# Patient Record
Sex: Female | Born: 1945 | Race: Black or African American | Hispanic: No | Smoking: Former smoker
Health system: Southern US, Community
[De-identification: ages and names within clinical notes are randomized; demographics above are authoritative.]

## PROBLEM LIST (undated history)

## (undated) DIAGNOSIS — Z9221 Personal history of antineoplastic chemotherapy: Secondary | ICD-10-CM

## (undated) DIAGNOSIS — R809 Proteinuria, unspecified: Secondary | ICD-10-CM

## (undated) DIAGNOSIS — D472 Monoclonal gammopathy: Secondary | ICD-10-CM

## (undated) DIAGNOSIS — I10 Essential (primary) hypertension: Secondary | ICD-10-CM

## (undated) DIAGNOSIS — K760 Fatty (change of) liver, not elsewhere classified: Secondary | ICD-10-CM

## (undated) DIAGNOSIS — Z923 Personal history of irradiation: Secondary | ICD-10-CM

## (undated) HISTORY — DX: Personal history of antineoplastic chemotherapy: Z92.21

## (undated) HISTORY — DX: Fatty (change of) liver, not elsewhere classified: K76.0

## (undated) HISTORY — DX: Essential (primary) hypertension: I10

## (undated) HISTORY — DX: Personal history of irradiation: Z92.3

## (undated) HISTORY — PX: ABDOMINAL HYSTERECTOMY: SHX81

## (undated) HISTORY — DX: Monoclonal gammopathy: D47.2

## (undated) HISTORY — DX: Proteinuria, unspecified: R80.9

---

## 2003-08-03 HISTORY — PX: BREAST BIOPSY: SHX20

## 2006-01-19 ENCOUNTER — Ambulatory Visit (HOSPITAL_COMMUNITY): Admission: RE | Admit: 2006-01-19 | Discharge: 2006-01-19 | Payer: Self-pay | Admitting: Family Medicine

## 2008-06-05 ENCOUNTER — Ambulatory Visit (HOSPITAL_COMMUNITY): Admission: RE | Admit: 2008-06-05 | Discharge: 2008-06-05 | Payer: Self-pay | Admitting: Family Medicine

## 2008-07-24 ENCOUNTER — Encounter (HOSPITAL_COMMUNITY): Admission: RE | Admit: 2008-07-24 | Discharge: 2008-08-23 | Payer: Self-pay | Admitting: Oncology

## 2008-07-24 ENCOUNTER — Ambulatory Visit (HOSPITAL_COMMUNITY): Payer: Self-pay | Admitting: Oncology

## 2009-01-13 ENCOUNTER — Ambulatory Visit (HOSPITAL_COMMUNITY): Payer: Self-pay | Admitting: Oncology

## 2009-01-13 ENCOUNTER — Encounter (HOSPITAL_COMMUNITY): Admission: RE | Admit: 2009-01-13 | Discharge: 2009-02-12 | Payer: Self-pay | Admitting: Oncology

## 2009-06-03 ENCOUNTER — Encounter (HOSPITAL_COMMUNITY): Payer: Self-pay | Admitting: Oncology

## 2009-06-03 ENCOUNTER — Encounter (HOSPITAL_COMMUNITY): Admission: RE | Admit: 2009-06-03 | Discharge: 2009-07-03 | Payer: Self-pay | Admitting: Oncology

## 2009-06-03 ENCOUNTER — Ambulatory Visit (HOSPITAL_COMMUNITY): Payer: Self-pay | Admitting: Oncology

## 2009-12-18 ENCOUNTER — Encounter (HOSPITAL_COMMUNITY): Admission: RE | Admit: 2009-12-18 | Discharge: 2010-01-17 | Payer: Self-pay | Admitting: Oncology

## 2009-12-18 ENCOUNTER — Ambulatory Visit (HOSPITAL_COMMUNITY): Payer: Self-pay | Admitting: Oncology

## 2009-12-24 ENCOUNTER — Ambulatory Visit (HOSPITAL_COMMUNITY): Payer: Self-pay | Admitting: Oncology

## 2010-06-22 ENCOUNTER — Encounter (HOSPITAL_COMMUNITY)
Admission: RE | Admit: 2010-06-22 | Discharge: 2010-07-22 | Payer: Self-pay | Source: Home / Self Care | Attending: Oncology | Admitting: Oncology

## 2010-06-30 ENCOUNTER — Ambulatory Visit (HOSPITAL_COMMUNITY): Payer: Self-pay | Admitting: Oncology

## 2010-07-21 ENCOUNTER — Encounter (HOSPITAL_COMMUNITY)
Admission: RE | Admit: 2010-07-21 | Discharge: 2010-08-20 | Payer: Self-pay | Source: Home / Self Care | Attending: Oncology | Admitting: Oncology

## 2010-08-23 ENCOUNTER — Encounter: Payer: Self-pay | Admitting: Family Medicine

## 2010-08-24 ENCOUNTER — Encounter: Payer: Self-pay | Admitting: Family Medicine

## 2010-10-13 LAB — PROTEIN ELECTROPHORESIS, SERUM
Albumin ELP: 55.9 % (ref 55.8–66.1)
Beta Globulin: 6 % (ref 4.7–7.2)
Gamma Globulin: 19.4 % — ABNORMAL HIGH (ref 11.1–18.8)

## 2010-10-13 LAB — DIFFERENTIAL
Eosinophils Absolute: 0.1 10*3/uL (ref 0.0–0.7)
Lymphocytes Relative: 47 % — ABNORMAL HIGH (ref 12–46)
Monocytes Absolute: 0.5 10*3/uL (ref 0.1–1.0)
Neutro Abs: 1.7 10*3/uL (ref 1.7–7.7)
Neutrophils Relative %: 38 % — ABNORMAL LOW (ref 43–77)

## 2010-10-13 LAB — PROTEIN, URINE, 24 HOUR
Collection Interval-UPROT: 24 hours
Urine Total Volume-UPROT: 700 mL

## 2010-10-13 LAB — COMPREHENSIVE METABOLIC PANEL
AST: 18 U/L (ref 0–37)
Albumin: 3.8 g/dL (ref 3.5–5.2)
Alkaline Phosphatase: 48 U/L (ref 39–117)
Creatinine, Ser: 0.99 mg/dL (ref 0.4–1.2)
GFR calc non Af Amer: 56 mL/min — ABNORMAL LOW (ref >60)
Total Bilirubin: 0.5 mg/dL (ref 0.3–1.2)
Total Protein: 7.1 g/dL (ref 6.0–8.3)

## 2010-10-13 LAB — CBC
MCV: 82.2 fL (ref 78.0–100.0)
Platelets: 225 10*3/uL (ref 150–400)
RBC: 5 MIL/uL (ref 3.87–5.11)
WBC: 4.3 10*3/uL (ref 4.0–10.5)

## 2010-10-13 LAB — CREATININE CLEARANCE, URINE, 24 HOUR
Creatinine Clearance: 64 mL/min — ABNORMAL LOW (ref 75–115)
Creatinine, 24H Ur: 912 mg/d (ref 700–1800)
Creatinine, Urine: 130.27 mg/dL
Urine Total Volume-CRCL: 700 mL

## 2010-10-13 LAB — UIFE/LIGHT CHAINS/TP QN, 24-HR UR
Albumin, U: DETECTED
Beta, Urine: DETECTED — AB
Free Kappa/Lambda Ratio: 8.38 ratio — ABNORMAL HIGH (ref 0.46–4.00)
Free Lambda Excretion/Day: 4.83 mg/d
Free Lambda Lt Chains,Ur: 0.69 mg/dL (ref 0.08–1.01)
Free Lt Chn Excr Rate: 40.46 mg/d
Time: 24 hours
Total Protein, Urine: 7.1 mg/dL

## 2010-10-13 LAB — IMMUNOFIXATION ELECTROPHORESIS: IgM, Serum: 214 mg/dL (ref 60–263)

## 2010-10-13 LAB — KAPPA/LAMBDA LIGHT CHAINS: Kappa, lambda light chain ratio: 0.59 (ref 0.26–1.65)

## 2010-10-19 LAB — DIFFERENTIAL
Basophils Absolute: 0 10*3/uL (ref 0.0–0.1)
Eosinophils Absolute: 0.1 10*3/uL (ref 0.0–0.7)
Eosinophils Relative: 3 % (ref 0–5)
Lymphocytes Relative: 43 % (ref 12–46)
Monocytes Absolute: 0.5 10*3/uL (ref 0.1–1.0)
Monocytes Relative: 11 % (ref 3–12)
Neutrophils Relative %: 42 % — ABNORMAL LOW (ref 43–77)

## 2010-10-19 LAB — COMPREHENSIVE METABOLIC PANEL
AST: 25 U/L (ref 0–37)
Alkaline Phosphatase: 46 U/L (ref 39–117)
BUN: 11 mg/dL (ref 6–23)
CO2: 27 mEq/L (ref 19–32)
Calcium: 9.2 mg/dL (ref 8.4–10.5)
GFR calc Af Amer: >60 mL/min (ref >60)
GFR calc non Af Amer: >60 mL/min (ref >60)
Sodium: 137 mEq/L (ref 135–145)

## 2010-10-19 LAB — CBC
MCV: 80.2 fL (ref 78.0–100.0)
WBC: 4.4 10*3/uL (ref 4.0–10.5)

## 2010-10-19 LAB — IMMUNOFIXATION ELECTROPHORESIS
IgA: 232 mg/dL (ref 68–378)
Total Protein ELP: 7.4 g/dL (ref 6.0–8.3)

## 2010-10-19 LAB — PROTEIN ELECTROPHORESIS, SERUM
Beta 2: 4.7 % (ref 3.2–6.5)
Beta Globulin: 6.5 % (ref 4.7–7.2)
Gamma Globulin: 18.4 % (ref 11.1–18.8)
M-Spike, %: 0.27 g/dL
Total Protein ELP: 7.4 g/dL (ref 6.0–8.3)

## 2010-11-04 LAB — CHROMOSOME ANALYSIS, BONE MARROW

## 2010-11-04 LAB — PROTEIN ELECTROPH W RFLX QUANT IMMUNOGLOBULINS
Gamma Globulin: 19.2 % — ABNORMAL HIGH (ref 11.1–18.8)
M-Spike, %: 0.28 g/dL

## 2010-11-04 LAB — IGG, IGA, IGM
IgA: 251 mg/dL (ref 68–378)
IgG (Immunoglobin G), Serum: 1520 mg/dL (ref 694–1618)
IgM, Serum: 223 mg/dL (ref 60–263)

## 2010-11-04 LAB — DIFFERENTIAL
Basophils Absolute: 0 10*3/uL (ref 0.0–0.1)
Basophils Relative: 1 % (ref 0–1)
Eosinophils Relative: 4 % (ref 0–5)
Lymphocytes Relative: 47 % — ABNORMAL HIGH (ref 12–46)
Monocytes Relative: 14 % — ABNORMAL HIGH (ref 3–12)

## 2010-11-04 LAB — IMMUNOFIXATION ELECTROPHORESIS
IgA: 246 mg/dL (ref 68–378)
IgM, Serum: 192 mg/dL (ref 60–263)
Total Protein ELP: 7.3 g/dL (ref 6.0–8.3)

## 2010-11-04 LAB — CBC
HCT: 40.2 % (ref 36.0–46.0)
WBC: 3.9 10*3/uL — ABNORMAL LOW (ref 4.0–10.5)

## 2010-11-04 LAB — IMMUNOFIXATION ADD-ON

## 2010-11-08 LAB — IMMUNOFIXATION ELECTROPHORESIS
IgA: 259 mg/dL (ref 68–378)
IgG (Immunoglobin G), Serum: 1560 mg/dL (ref 694–1618)
Total Protein ELP: 7.1 g/dL (ref 6.0–8.3)

## 2010-11-09 LAB — COMPREHENSIVE METABOLIC PANEL
AST: 28 U/L (ref 0–37)
BUN: 13 mg/dL (ref 6–23)
CO2: 27 mEq/L (ref 19–32)
Calcium: 9.2 mg/dL (ref 8.4–10.5)
Creatinine, Ser: 0.77 mg/dL (ref 0.4–1.2)
GFR calc Af Amer: >60 mL/min (ref >60)
GFR calc non Af Amer: >60 mL/min (ref >60)

## 2010-11-09 LAB — DIFFERENTIAL
Basophils Absolute: 0 10*3/uL (ref 0.0–0.1)
Eosinophils Relative: 2 % (ref 0–5)
Lymphocytes Relative: 42 % (ref 12–46)
Lymphs Abs: 2.1 10*3/uL (ref 0.7–4.0)
Neutro Abs: 2.3 10*3/uL (ref 1.7–7.7)

## 2010-11-09 LAB — CBC
HCT: 38.5 % (ref 36.0–46.0)
MCHC: 33.8 g/dL (ref 30.0–36.0)
MCV: 81.8 fL (ref 78.0–100.0)
Platelets: 195 10*3/uL (ref 150–400)

## 2010-11-09 LAB — PROTEIN ELECTROPHORESIS, SERUM
Alpha-1-Globulin: 4.1 % (ref 2.9–4.9)
Alpha-2-Globulin: 8.7 % (ref 7.1–11.8)
Beta 2: 4.4 % (ref 3.2–6.5)
Gamma Globulin: 18.8 % (ref 11.1–18.8)

## 2010-11-09 LAB — IMMUNOFIXATION ELECTROPHORESIS

## 2011-01-11 ENCOUNTER — Other Ambulatory Visit (HOSPITAL_COMMUNITY): Payer: Self-pay | Admitting: Oncology

## 2011-01-11 ENCOUNTER — Encounter (HOSPITAL_COMMUNITY): Payer: BC Managed Care – PPO | Attending: Oncology

## 2011-01-11 DIAGNOSIS — R809 Proteinuria, unspecified: Secondary | ICD-10-CM

## 2011-01-11 DIAGNOSIS — D472 Monoclonal gammopathy: Secondary | ICD-10-CM | POA: Insufficient documentation

## 2011-01-11 DIAGNOSIS — E119 Type 2 diabetes mellitus without complications: Secondary | ICD-10-CM | POA: Insufficient documentation

## 2011-01-11 LAB — COMPREHENSIVE METABOLIC PANEL
Albumin: 3.6 g/dL (ref 3.5–5.2)
Alkaline Phosphatase: 62 U/L (ref 39–117)
BUN: 15 mg/dL (ref 6–23)
CO2: 31 mEq/L (ref 19–32)
Chloride: 103 mEq/L (ref 96–112)
Creatinine, Ser: 0.95 mg/dL (ref 0.4–1.2)
GFR calc non Af Amer: 59 mL/min — ABNORMAL LOW (ref >60)
Glucose, Bld: 113 mg/dL — ABNORMAL HIGH (ref 70–99)
Potassium: 3.4 mEq/L — ABNORMAL LOW (ref 3.5–5.1)
Total Bilirubin: 0.3 mg/dL (ref 0.3–1.2)

## 2011-01-11 LAB — CBC
HCT: 40.7 % (ref 36.0–46.0)
Hemoglobin: 12.9 g/dL (ref 12.0–15.0)
MCV: 83.7 fL (ref 78.0–100.0)
RBC: 4.86 MIL/uL (ref 3.87–5.11)
WBC: 4 10*3/uL (ref 4.0–10.5)

## 2011-01-11 LAB — DIFFERENTIAL
Basophils Absolute: 0 10*3/uL (ref 0.0–0.1)
Eosinophils Relative: 2 % (ref 0–5)
Lymphocytes Relative: 49 % — ABNORMAL HIGH (ref 12–46)
Lymphs Abs: 2 10*3/uL (ref 0.7–4.0)
Neutro Abs: 1.5 10*3/uL — ABNORMAL LOW (ref 1.7–7.7)
Neutrophils Relative %: 37 % — ABNORMAL LOW (ref 43–77)

## 2011-01-12 LAB — KAPPA/LAMBDA LIGHT CHAINS
Kappa free light chain: 2.42 mg/dL — ABNORMAL HIGH (ref 0.33–1.94)
Lambda free light chains: 2.14 mg/dL (ref 0.57–2.63)

## 2011-01-14 LAB — MULTIPLE MYELOMA PANEL, SERUM
Alpha-2-Globulin: 9.2 % (ref 7.1–11.8)
Beta 2: 5.6 % (ref 3.2–6.5)
Beta Globulin: 5.7 % (ref 4.7–7.2)
IgA: 240 mg/dL (ref 69–380)
IgM, Serum: 188 mg/dL (ref 52–322)
M-Spike, %: NOT DETECTED g/dL
Total Protein: 7.2 g/dL (ref 6.0–8.3)

## 2011-01-18 ENCOUNTER — Encounter (HOSPITAL_COMMUNITY): Payer: BC Managed Care – PPO | Admitting: Oncology

## 2011-01-18 DIAGNOSIS — D472 Monoclonal gammopathy: Secondary | ICD-10-CM

## 2011-04-12 ENCOUNTER — Encounter (HOSPITAL_COMMUNITY): Payer: BC Managed Care – PPO | Attending: Oncology

## 2011-04-12 DIAGNOSIS — D472 Monoclonal gammopathy: Secondary | ICD-10-CM | POA: Insufficient documentation

## 2011-04-12 DIAGNOSIS — E119 Type 2 diabetes mellitus without complications: Secondary | ICD-10-CM | POA: Insufficient documentation

## 2011-04-12 LAB — POTASSIUM: Potassium: 3.6 mEq/L (ref 3.5–5.1)

## 2011-04-12 NOTE — Progress Notes (Signed)
Labs drawn today for k+

## 2011-05-07 LAB — DIFFERENTIAL
Basophils Absolute: 0 10*3/uL (ref 0.0–0.1)
Basophils Relative: 1 % (ref 0–1)
Eosinophils Absolute: 0.1 10*3/uL (ref 0.0–0.7)
Eosinophils Relative: 4 % (ref 0–5)
Monocytes Absolute: 0.4 10*3/uL (ref 0.1–1.0)
Monocytes Relative: 10 % (ref 3–12)

## 2011-05-07 LAB — PROTEIN ELECTROPHORESIS, SERUM
Albumin ELP: 56.6 % (ref 55.8–66.1)
Alpha-1-Globulin: 4.2 % (ref 2.9–4.9)
Alpha-2-Globulin: 9.1 % (ref 7.1–11.8)
Beta 2: 4.9 % (ref 3.2–6.5)
Beta Globulin: 6.2 % (ref 4.7–7.2)
Gamma Globulin: 19 % — ABNORMAL HIGH (ref 11.1–18.8)
M-Spike, %: NOT DETECTED g/dL
Total Protein ELP: 7.6 g/dL (ref 6.0–8.3)

## 2011-05-07 LAB — CBC
HCT: 40.1 % (ref 36.0–46.0)
Hemoglobin: 13.2 g/dL (ref 12.0–15.0)
MCHC: 32.8 g/dL (ref 30.0–36.0)
MCV: 81.9 fL (ref 78.0–100.0)
Platelets: 219 K/uL (ref 150–400)
RBC: 4.89 MIL/uL (ref 3.87–5.11)
RDW: 14.7 % (ref 11.5–15.5)
WBC: 4.2 K/uL (ref 4.0–10.5)

## 2011-05-07 LAB — IMMUNOFIXATION ELECTROPHORESIS
IgA: 265 mg/dL (ref 68–378)
IgG (Immunoglobin G), Serum: 1610 mg/dL (ref 694–1618)
IgM, Serum: 220 mg/dL (ref 60–263)
Total Protein ELP: 7.7 g/dL (ref 6.0–8.3)

## 2011-05-07 LAB — COMPREHENSIVE METABOLIC PANEL
ALT: 22 U/L (ref 0–35)
AST: 20 U/L (ref 0–37)
Albumin: 3.9 g/dL (ref 3.5–5.2)
Alkaline Phosphatase: 49 U/L (ref 39–117)
Chloride: 105 mEq/L (ref 96–112)
Potassium: 4.3 mEq/L (ref 3.5–5.1)
Sodium: 138 mEq/L (ref 135–145)
Total Bilirubin: 0.6 mg/dL (ref 0.3–1.2)
Total Protein: 7.1 g/dL (ref 6.0–8.3)

## 2011-05-07 LAB — BETA 2 MICROGLOBULIN, SERUM: Beta-2 Microglobulin: 1.73 mg/L (ref 1.01–1.73)

## 2011-05-19 ENCOUNTER — Other Ambulatory Visit (HOSPITAL_COMMUNITY): Payer: Self-pay | Admitting: Physician Assistant

## 2011-05-19 DIAGNOSIS — Z139 Encounter for screening, unspecified: Secondary | ICD-10-CM

## 2011-05-24 ENCOUNTER — Ambulatory Visit (HOSPITAL_COMMUNITY): Payer: BC Managed Care – PPO

## 2011-05-26 ENCOUNTER — Telehealth: Payer: Self-pay

## 2011-05-26 NOTE — Telephone Encounter (Signed)
LMOVM at work for pt to call. (She had told Darl Pikes that it was OK to call her at work )

## 2011-05-26 NOTE — Telephone Encounter (Signed)
Pt to call back tomorrow with list of meds and insurance. She said she had a Screening Colonoscopy in 2005 in [place] and she did not have any polyps. She has no family history of colon cancer. But she said the insurance she has now wants her to have one since they do not have one on file and so does the doctor.

## 2011-05-27 ENCOUNTER — Other Ambulatory Visit: Payer: Self-pay

## 2011-05-27 ENCOUNTER — Ambulatory Visit (HOSPITAL_COMMUNITY): Payer: BC Managed Care – PPO

## 2011-05-27 DIAGNOSIS — Z139 Encounter for screening, unspecified: Secondary | ICD-10-CM

## 2011-05-27 NOTE — Telephone Encounter (Signed)
Pt was going to fax a list of medications and her insurance card. I have not received it. I just called her work number and left message on her VM to give me a call.

## 2011-05-31 NOTE — Telephone Encounter (Signed)
LMOM for pt to call with the medication and insurance information.

## 2011-06-02 ENCOUNTER — Telehealth: Payer: Self-pay

## 2011-06-02 NOTE — Telephone Encounter (Signed)
CONITNUE METFORMIN. PREPOPIK SAMPLE-SPLIT DOSING

## 2011-06-02 NOTE — Telephone Encounter (Signed)
Called pt and triage was completed and routed to Dr. Darrick Penna. ( See phone note of 06/02/2011).

## 2011-06-02 NOTE — Telephone Encounter (Signed)
Gastroenterology Pre-Procedure Form  Request Date: 05/26/2011          Requesting Physician:      PATIENT INFORMATION:  Melanie Welch is a 65 y.o., female (DOB=[birth year]).  PROCEDURE: Procedure(s) requested: colonoscopy Procedure Reason: screening for colon cancer  PATIENT REVIEW QUESTIONS: The patient reports the following:   1. Diabetes Melitis: yes  2. Joint replacements in the past 12 months: no 3. Major health problems in the past 3 months: no 4. Has an artificial valve or MVP:no 5. Has been advised in past to take antibiotics in advance of a procedure like teeth cleaning: no}    MEDICATIONS & ALLERGIES:    Patient reports the following regarding taking any blood thinners:   Plavix? no Aspirin?no Coumadin?  no  Patient confirms/reports the following medications:  Current Outpatient Prescriptions  Medication Sig Dispense Refill  . amLODipine (NORVASC) 10 MG tablet Take 5 mg by mouth daily.        Marland Kitchen lisinopril (PRINIVIL,ZESTRIL) 10 MG tablet Take 10 mg by mouth daily.        . metFORMIN (GLUMETZA) 500 MG (MOD) 24 hr tablet Take 500 mg by mouth daily with breakfast.          Patient confirms/reports the following allergies:  No Known Allergies  Patient is appropriate to schedule for requested procedure(s): yes  AUTHORIZATION INFORMATION Primary Insurance:   ID #:   Group #:  Pre-Cert / Auth required:  Pre-Cert / Auth #:   Secondary Insurance:   ID #:   Group #: Pre-Cert / Auth required: 4} Pre-Cert / Auth #:   No orders of the defined types were placed in this encounter.    SCHEDULE INFORMATION: Procedure has been scheduled as follows:  Date: 06/11/2011     Time: 9:30 AM  Location: [facility] Short Stay  This Gastroenterology Pre-Precedure Form is being routed to the following provider(s) for review: Jonette Eva, MD

## 2011-06-04 NOTE — Telephone Encounter (Signed)
Per Dr. Darrick Penna, ok to use movie prep. Rx and instructions mailed.

## 2011-06-08 ENCOUNTER — Telehealth: Payer: Self-pay

## 2011-06-08 NOTE — Telephone Encounter (Signed)
Pt called to make sure we got her message from yesterday that she was sick and wanted to cancel her tcs for Friday. She would like to [facility] that to be done after Thanksgiving.

## 2011-06-08 NOTE — Telephone Encounter (Signed)
LATE ENTRY:  Pt left VM yesterday that she had to cancel appt for colonoscopy on 06/11/2011 because she has flu. Said she will reschedule. I took off schedule and informed Kim in Endo. Called pt this morning to reschedule and no answer.

## 2011-06-10 NOTE — Telephone Encounter (Signed)
R/S for 07/13/2011 @ 8:30 AM and letter mailed with new instructions. Kim aware.

## 2011-06-10 NOTE — Progress Notes (Signed)
Mailed new date/ time and instructions for colonoscopy on 07/13/2011 @ 8:30 AM and be at [facility] @ 7:30 AM. Selena Batten is aware.

## 2011-06-17 ENCOUNTER — Ambulatory Visit (HOSPITAL_COMMUNITY)
Admission: RE | Admit: 2011-06-17 | Discharge: 2011-06-17 | Disposition: A | Discharge status: Home / Self Care | Payer: BC Managed Care – PPO | Source: Ambulatory Visit | Attending: Physician Assistant | Admitting: Physician Assistant

## 2011-06-17 DIAGNOSIS — Z139 Encounter for screening, unspecified: Secondary | ICD-10-CM

## 2011-06-17 DIAGNOSIS — Z1231 Encounter for screening mammogram for malignant neoplasm of breast: Secondary | ICD-10-CM | POA: Insufficient documentation

## 2011-07-02 ENCOUNTER — Other Ambulatory Visit (HOSPITAL_COMMUNITY): Payer: Self-pay | Admitting: Oncology

## 2011-07-05 ENCOUNTER — Encounter (HOSPITAL_COMMUNITY): Payer: BC Managed Care – PPO | Attending: Oncology

## 2011-07-05 DIAGNOSIS — C9000 Multiple myeloma not having achieved remission: Secondary | ICD-10-CM | POA: Insufficient documentation

## 2011-07-05 DIAGNOSIS — D472 Monoclonal gammopathy: Secondary | ICD-10-CM

## 2011-07-05 LAB — CBC
HCT: 40.2 % (ref 36.0–46.0)
Hemoglobin: 12.8 g/dL (ref 12.0–15.0)
WBC: 5.4 10*3/uL (ref 4.0–10.5)

## 2011-07-05 LAB — DIFFERENTIAL
Basophils Absolute: 0.1 10*3/uL (ref 0.0–0.1)
Lymphocytes Relative: 44 % (ref 12–46)
Monocytes Absolute: 0.7 10*3/uL (ref 0.1–1.0)
Monocytes Relative: 12 % (ref 3–12)
Neutro Abs: 2.1 10*3/uL (ref 1.7–7.7)
Neutrophils Relative %: 39 % — ABNORMAL LOW (ref 43–77)

## 2011-07-05 NOTE — Progress Notes (Signed)
[employer] presented for [employer]. Labs per MD order drawn via Peripheral Line 23 gauge needle inserted in left arm  Good blood return present. Procedure without incident.  Needle removed intact. Patient tolerated procedure well.

## 2011-07-06 ENCOUNTER — Telehealth: Payer: Self-pay

## 2011-07-06 LAB — KAPPA/LAMBDA LIGHT CHAINS: Kappa free light chain: 2.62 mg/dL — ABNORMAL HIGH (ref 0.33–1.94)

## 2011-07-06 NOTE — Telephone Encounter (Signed)
LMOM at work to call and update info.

## 2011-07-08 LAB — MULTIPLE MYELOMA PANEL, SERUM
Albumin ELP: 56 % (ref 55.8–66.1)
Alpha-1-Globulin: 3.9 % (ref 2.9–4.9)
Alpha-2-Globulin: 9.4 % (ref 7.1–11.8)
IgA: 249 mg/dL (ref 69–380)
IgG (Immunoglobin G), Serum: 1360 mg/dL (ref 690–1700)
M-Spike, %: 0.2 g/dL
Total Protein: 7.3 g/dL (ref 6.0–8.3)

## 2011-07-08 NOTE — Telephone Encounter (Signed)
Called home, many rings and no answer. Called work, left message with Baron Hamper that it is very important for a return call.  Pt is scheduled for procedure on 07/13/2011 and I need to update meds and info.

## 2011-07-12 ENCOUNTER — Encounter (HOSPITAL_COMMUNITY): Payer: Self-pay | Admitting: Oncology

## 2011-07-12 ENCOUNTER — Encounter (HOSPITAL_BASED_OUTPATIENT_CLINIC_OR_DEPARTMENT_OTHER): Payer: BC Managed Care – PPO | Admitting: Oncology

## 2011-07-12 VITALS — BP 136/82 | HR 64 | Temp 97.8°F | Ht 68.0 in | Wt 178.0 lb

## 2011-07-12 DIAGNOSIS — E119 Type 2 diabetes mellitus without complications: Secondary | ICD-10-CM

## 2011-07-12 DIAGNOSIS — D472 Monoclonal gammopathy: Secondary | ICD-10-CM

## 2011-07-12 MED ORDER — SODIUM CHLORIDE 0.45 % IV SOLN
Freq: Once | INTRAVENOUS | Status: AC
  Administered 2011-07-13: 08:00:00 via INTRAVENOUS

## 2011-07-12 NOTE — Progress Notes (Signed)
CC:   Corrie Mckusick, M.D.  DIAGNOSES: 1. 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 2. Excessive weight. 3. Diabetes mellitus. 4. Hypertension. 5. Hypokalemia secondary to diuretic use. 6. Hot flashes which sometimes keeps her awake at night.  Melanie Welch is here today to go over her lab work which really is very stable. Her highest M spike in the past has been 280 mg/dL.  It is now visible again at 200 mg/dL.  Her IgA and IgM both remain in the normal range. IgG is still also in the normal range.  This is an IgG lambda protein. Again it is only intermittently present.  We have been seeing her since 07/24/2008 when it was too small to quantitate.  Then it was quantifiable November 2010 and once again in May 2011, but basically too small to quantitate again in November 2011.  It was not even detectable in June of this year, and now it is back again, so I think we need to watch her, but I do not think we need to have to worry about  her too much.  Since we have watched her now for 3 years, we are going to just give her a year's followup since she is really not excited about coming back, but I have talked her into at least coming back in a year.  Her other labs including CBC, differential really are quite unremarkable otherwise.  She still has a normal kappa to lambda light chain ratio of 1.24.  She looks good and otherwise feels fine except for the hot flashes, so we will see her back in a year, sooner if need be, but I did explain to her what the ramifications of MGUS are and she is willing to come back and see [place].  She understands about 25% of people with MGUS can go on to progress to myeloma, so she does need to be watched.    ______________________________ Ladona Horns. Mariel Sleet, MD ESN/MEDQ  D:  07/12/2011  T:   07/12/2011  Job:  [id number]

## 2011-07-12 NOTE — Telephone Encounter (Signed)
Called home, many rings and no answer.

## 2011-07-12 NOTE — Progress Notes (Signed)
This office note has been dictated.

## 2011-07-12 NOTE — Telephone Encounter (Signed)
Pt returned call. She has not had any new medical problems and no change in medications. She was ot sure she had the prescription at home, so I left another one at the front for pick up today along with the instructions. She is aware that she is to be on clear liquids today.

## 2011-07-12 NOTE — Patient Instructions (Signed)
[facility]  Discharge Instructions Melanie Welch  [id number] [birth year]   RECOMMENDATIONS MADE BY THE CONSULTANT AND ANY TEST RESULTS WILL BE SENT TO YOUR REFERRING DOCTOR.   EXAM FINDINGS BY MD TODAY AND SIGNS AND SYMPTOMS TO REPORT TO CLINIC OR PRIMARY MD:  You have a monoclonal gammopathy of unknown significance (mgus). Your M-spike is 200 mg and we don't worry until it is 2000 mg. But we need to keep watch on it.  INSTRUCTIONS GIVEN AND DISCUSSED: Talk to [facility] or your gynecologist about hot flashes. SPECIAL INSTRUCTIONS/FOLLOW-UP: One year after labs  I acknowledge that I have been informed and understand all the instructions given to me and received a copy. I do not have any more questions at this time, but understand that I may call the Specialty Clinic at [facility] at [phone] during business hours should I have any further questions or need assistance in obtaining follow-up care.    __________________________________________  _____________  __________ Signature of Patient or Authorized Representative            Date                   Time    __________________________________________ Nurse's Signature

## 2011-07-13 ENCOUNTER — Ambulatory Visit (HOSPITAL_COMMUNITY)
Admission: RE | Admit: 2011-07-13 | Discharge: 2011-07-13 | Disposition: A | Discharge status: Home / Self Care | Payer: BC Managed Care – PPO | Source: Ambulatory Visit | Attending: Gastroenterology | Admitting: Gastroenterology

## 2011-07-13 ENCOUNTER — Encounter (HOSPITAL_COMMUNITY): Payer: Self-pay | Admitting: *Deleted

## 2011-07-13 ENCOUNTER — Encounter (HOSPITAL_COMMUNITY)
Admission: RE | Disposition: A | Discharge status: Home / Self Care | Payer: Self-pay | Source: Ambulatory Visit | Attending: Gastroenterology

## 2011-07-13 ENCOUNTER — Other Ambulatory Visit: Payer: Self-pay | Admitting: Gastroenterology

## 2011-07-13 DIAGNOSIS — I10 Essential (primary) hypertension: Secondary | ICD-10-CM | POA: Insufficient documentation

## 2011-07-13 DIAGNOSIS — K648 Other hemorrhoids: Secondary | ICD-10-CM

## 2011-07-13 DIAGNOSIS — K5730 Diverticulosis of large intestine without perforation or abscess without bleeding: Secondary | ICD-10-CM | POA: Insufficient documentation

## 2011-07-13 DIAGNOSIS — D126 Benign neoplasm of colon, unspecified: Secondary | ICD-10-CM | POA: Insufficient documentation

## 2011-07-13 DIAGNOSIS — Z1211 Encounter for screening for malignant neoplasm of colon: Secondary | ICD-10-CM | POA: Insufficient documentation

## 2011-07-13 DIAGNOSIS — Z01812 Encounter for preprocedural laboratory examination: Secondary | ICD-10-CM | POA: Insufficient documentation

## 2011-07-13 DIAGNOSIS — Z139 Encounter for screening, unspecified: Secondary | ICD-10-CM

## 2011-07-13 DIAGNOSIS — E119 Type 2 diabetes mellitus without complications: Secondary | ICD-10-CM | POA: Insufficient documentation

## 2011-07-13 DIAGNOSIS — Z79899 Other long term (current) drug therapy: Secondary | ICD-10-CM | POA: Insufficient documentation

## 2011-07-13 HISTORY — PX: COLONOSCOPY: SHX5424

## 2011-07-13 SURGERY — COLONOSCOPY
Anesthesia: Moderate Sedation

## 2011-07-13 MED ORDER — MEPERIDINE HCL 100 MG/ML IJ SOLN
INTRAMUSCULAR | Status: DC | PRN
  Administered 2011-07-13: 50 mg via INTRAVENOUS
  Administered 2011-07-13: 25 mg via INTRAVENOUS

## 2011-07-13 MED ORDER — MEPERIDINE HCL 100 MG/ML IJ SOLN
INTRAMUSCULAR | Status: AC
  Filled 2011-07-13: qty 2

## 2011-07-13 MED ORDER — MIDAZOLAM HCL 5 MG/5ML IJ SOLN
INTRAMUSCULAR | Status: AC
  Filled 2011-07-13: qty 10

## 2011-07-13 MED ORDER — MIDAZOLAM HCL 5 MG/5ML IJ SOLN
INTRAMUSCULAR | Status: DC | PRN
  Administered 2011-07-13 (×2): 2 mg via INTRAVENOUS

## 2011-07-13 NOTE — H&P (Signed)
  Primary Care Physician:  Colette Ribas, MD Primary Gastroenterologist:  Dr. Darrick Penna  Pre-Procedure History & Physical: HPI:  Melanie Welch is a 65 y.o. female here for average risk screening.  Past Medical History  Diagnosis Date  . Diabetes mellitus   . Hypertension   . Proteinuria   . MGUS (monoclonal gammopathy of unknown significance)     Past Surgical History  Procedure Date  . Abdominal hysterectomy   . Breast biopsy 2005    left    Prior to Admission medications   Medication Sig Start Date End Date Taking? Authorizing Provider  amLODipine (NORVASC) 10 MG tablet Take 5 mg by mouth daily.     Yes Historical Provider, MD  lisinopril (PRINIVIL,ZESTRIL) 10 MG tablet Take 10 mg by mouth daily.     Yes Historical Provider, MD  metFORMIN (GLUMETZA) 500 MG (MOD) 24 hr tablet Take 500 mg by mouth daily with breakfast.     Yes Historical Provider, MD    Allergies as of 05/27/2011  . (No Known Allergies)    History reviewed. No pertinent family history. no colon ca or polyps.  History   Social History  . Marital Status: Divorced    Spouse Name: N/A    Number of Children: N/A  . Years of Education: N/A   Occupational History  . Not on file.   Social History Main Topics  . Smoking status: Former Games developer  . Smokeless tobacco: Not on file  . Alcohol Use: Yes  . Drug Use: No  . Sexually Active:    Other Topics Concern  . Not on file   Social History Narrative  . No narrative on file    Review of Systems: See HPI, otherwise negative ROS   Physical Exam: There were no vitals taken for this visit. General:   Alert,  pleasant and cooperative in NAD Head:  Normocephalic and atraumatic. Neck:  Supple;  Lungs:  Clear throughout to auscultation.    Heart:  Regular rate and rhythm. Abdomen:  Soft, nontender and nondistended. Normal bowel sounds, without guarding, and without rebound.   Neurologic:  Alert and  oriented x4;  grossly normal  neurologically.  Impression/Plan:     AVERAGE RISK  PLAN: TCS TODAY

## 2011-07-13 NOTE — Op Note (Signed)
[facility] [address] [place], [place]  [address]  COLONOSCOPY PROCEDURE REPORT  PATIENT:  Melanie Welch, Melanie Welch  MR#:  [id number] BIRTHDATE:  09/28/1945, 65 yrs. old  GENDER:  female  ENDOSCOPIST:  Jonette Eva, MD REF. BY:  Assunta Found, M.D. ASSISTANT:  PROCEDURE DATE:  07/13/2011 PROCEDURE:  Colonoscopy with biopsy and snare polypectomy  INDICATIONS:  avereage risk screening  MEDICATIONS:   Demerol 75 mg IV, Versed 4 mg IV  DESCRIPTION OF PROCEDURE:    Physical exam was performed. Informed consent was obtained from the patient after explaining the benefits, risks, and alternatives to procedure.  The patient was connected to monitor and placed in left lateral position. Continuous oxygen was provided by nasal cannula and IV medicine administered through an indwelling cannula.  After administration of sedation and rectal exam, the patient's rectum was intubated and the EC-[id number] ([id number]) colonoscope was advanced under direct visualization to the cecum.  The scope was removed slowly by carefully examining the color, texture, anatomy, and integrity mucosa on the way out.  The patient was recovered in endoscopy and discharged home in satisfactory condition. <<PROCEDUREIMAGES>>  FINDINGS:  POLYPOID APPEARING HEPATIC FLEXURE LESION BIOPSIED VIA COLD FORCEPS. PROXIMAL TRANSVERSE COLON EROSION REMOVED VIA COLD FORCEPS. 6 MM PEDUNCULATED MID-TRANSVERSE COLON POLYP REMOVED VIA SNARE CAUTERY. PAN-COLONOC DIVERTICULOSIS. MODERATE INTERNAL HEMORRHOIDS.  PREP QUALITY: GOOD CECAL W/D TIME:    18 minutes  COMPLICATIONS:    None  ENDOSCOPIC IMPRESSION: TCS IN 10 YEARS HIGH FIBER DIET AWAIT BIOPSIES  RECOMMENDATIONS:  REPEAT EXAM:  No  ______________________________ Jonette Eva, MD  CC:  Assunta Found, M.D.  n. eSIGNED:   Sandi Fields at 07/13/2011 09:00 AM  Keitha Butte, [phone]

## 2011-07-15 ENCOUNTER — Telehealth: Payer: Self-pay | Admitting: Gastroenterology

## 2011-07-15 NOTE — Telephone Encounter (Signed)
Please call pt. She had a polypoid lesions removed from her colon. They are benign. TCS in 10 years. High fiber diet.

## 2011-07-15 NOTE — Telephone Encounter (Signed)
Results Cc to PCP  

## 2011-07-15 NOTE — Telephone Encounter (Signed)
Pt called back and was in formed

## 2011-07-15 NOTE — Telephone Encounter (Signed)
LMOM at work to call.

## 2011-07-21 ENCOUNTER — Encounter (HOSPITAL_COMMUNITY): Payer: Self-pay | Admitting: Gastroenterology

## 2011-07-28 ENCOUNTER — Encounter: Payer: Self-pay | Admitting: Oncology

## 2011-08-04 NOTE — Telephone Encounter (Signed)
Reminder in epic to have tcs in 10 years 

## 2011-08-26 ENCOUNTER — Other Ambulatory Visit (HOSPITAL_COMMUNITY): Payer: Self-pay | Admitting: Family Medicine

## 2011-08-26 DIAGNOSIS — E049 Nontoxic goiter, unspecified: Secondary | ICD-10-CM

## 2011-08-31 ENCOUNTER — Ambulatory Visit (HOSPITAL_COMMUNITY): Payer: BC Managed Care – PPO

## 2011-09-03 ENCOUNTER — Ambulatory Visit (HOSPITAL_COMMUNITY)
Admission: RE | Admit: 2011-09-03 | Discharge: 2011-09-03 | Disposition: A | Discharge status: Home / Self Care | Payer: BC Managed Care – PPO | Source: Ambulatory Visit | Attending: Family Medicine | Admitting: Family Medicine

## 2011-09-03 DIAGNOSIS — E049 Nontoxic goiter, unspecified: Secondary | ICD-10-CM | POA: Insufficient documentation

## 2011-09-03 DIAGNOSIS — E079 Disorder of thyroid, unspecified: Secondary | ICD-10-CM | POA: Insufficient documentation

## 2012-03-10 ENCOUNTER — Telehealth (HOSPITAL_COMMUNITY): Payer: Self-pay | Admitting: Dietician

## 2012-03-10 NOTE — Telephone Encounter (Signed)
Received records request from [place] at [facility] inquiring if pt had seen seen. Pt has appointment was 02/18/06 for [facility] group diabetes class. Since that date, pt has not attended group diabetes class of met with dietitian for one-on-one counseling. There are no future appointments on record for this pt.

## 2012-03-30 ENCOUNTER — Telehealth (HOSPITAL_COMMUNITY): Payer: Self-pay | Admitting: Dietician

## 2012-03-30 NOTE — Telephone Encounter (Signed)
Pt has not attended diabetes class. See previous telephone encounter for details. Results faxed through [facility] on 03/10/12. Lawson Fiscal reports records were not received. Manually faxed records to [facility].

## 2012-07-10 ENCOUNTER — Encounter (HOSPITAL_COMMUNITY): Payer: Medicare Other | Attending: Oncology

## 2012-07-10 DIAGNOSIS — D472 Monoclonal gammopathy: Secondary | ICD-10-CM | POA: Insufficient documentation

## 2012-07-10 LAB — CBC
HCT: 40.3 % (ref 36.0–46.0)
Hemoglobin: 12.9 g/dL (ref 12.0–15.0)
MCV: 82.8 fL (ref 78.0–100.0)
Platelets: 210 10*3/uL (ref 150–400)
RBC: 4.87 MIL/uL (ref 3.87–5.11)
WBC: 4.8 10*3/uL (ref 4.0–10.5)

## 2012-07-10 LAB — COMPREHENSIVE METABOLIC PANEL
AST: 20 U/L (ref 0–37)
BUN: 11 mg/dL (ref 6–23)
CO2: 29 mEq/L (ref 19–32)
Chloride: 104 mEq/L (ref 96–112)
Creatinine, Ser: 0.88 mg/dL (ref 0.50–1.10)
GFR calc Af Amer: 78 mL/min — ABNORMAL LOW (ref >90)
GFR calc non Af Amer: 67 mL/min — ABNORMAL LOW (ref >90)
Glucose, Bld: 148 mg/dL — ABNORMAL HIGH (ref 70–99)
Total Bilirubin: 0.2 mg/dL — ABNORMAL LOW (ref 0.3–1.2)

## 2012-07-10 NOTE — Progress Notes (Signed)
Labs drawn today for cbc,cmp,mm panel,kllc

## 2012-07-11 LAB — KAPPA/LAMBDA LIGHT CHAINS: Kappa, lambda light chain ratio: 0.98 (ref 0.26–1.65)

## 2012-07-12 ENCOUNTER — Ambulatory Visit (HOSPITAL_COMMUNITY): Payer: BC Managed Care – PPO | Admitting: Oncology

## 2012-07-12 LAB — MULTIPLE MYELOMA PANEL, SERUM
Albumin ELP: 55.9 % (ref 55.8–66.1)
Beta 2: 5.7 % (ref 3.2–6.5)
Gamma Globulin: 18.9 % — ABNORMAL HIGH (ref 11.1–18.8)
IgM, Serum: 213 mg/dL (ref 52–322)

## 2012-08-21 ENCOUNTER — Encounter (HOSPITAL_COMMUNITY): Payer: Self-pay | Admitting: Oncology

## 2012-08-21 ENCOUNTER — Encounter (HOSPITAL_COMMUNITY): Payer: Medicare Other | Attending: Oncology | Admitting: Oncology

## 2012-08-21 VITALS — BP 124/82 | HR 76 | Temp 98.3°F | Resp 16 | Wt 182.4 lb

## 2012-08-21 DIAGNOSIS — D472 Monoclonal gammopathy: Secondary | ICD-10-CM

## 2012-08-21 NOTE — Progress Notes (Signed)
Problem #1 MGUS with minimal Bence-Jones proteinuria in the past and intermittently quantifiable M spike on SPEP and quantitative IFE. Her labs with you the day showed no definable M spike. Her IFE did reveal an IgG lambda but again too small to quantitate.  Calcium level is normal, kidney function is normal, complete blood count is normal, and her IgG, IgM, and IgA levels remain within the normal range.  We therefore need to just check her in my opinion once a year since she has been stable for some time now.  We will check her urine as well as blood work next year. She remains asymptomatic presently. She is now retired. She is working on her weight, her blood sugar, and her blood pressure.

## 2012-08-21 NOTE — Patient Instructions (Addendum)
[facility] Discharge Instructions  RECOMMENDATIONS MADE BY THE CONSULTANT AND ANY TEST RESULTS WILL BE SENT TO YOUR REFERRING PHYSICIAN.  EXAM FINDINGS BY THE PHYSICIAN TODAY AND SIGNS OR SYMPTOMS TO REPORT TO CLINIC OR PRIMARY PHYSICIAN: Discussion by MD.  All of your blood work is normal.    MEDICATIONS PRESCRIBED:  none  INSTRUCTIONS GIVEN AND DISCUSSED: Report recurring infections, fevers, night sweats, etc.  SPECIAL INSTRUCTIONS/FOLLOW-UP: Return in 1 year for lab work and bring in a 24 hour urine collection and to see MD in 1 year.  Thank you for choosing [facility] to provide your oncology and hematology care.  To afford each patient quality time with our providers, please arrive at least 15 minutes before your scheduled appointment time.  With your help, our goal is to use those 15 minutes to complete the necessary work-up to ensure our physicians have the information they need to help with your evaluation and healthcare recommendations.    Effective January 1st, 2014, we ask that you re-schedule your appointment with our physicians should you arrive 10 or more minutes late for your appointment.  We strive to give you quality time with our providers, and arriving late affects you and other patients whose appointments are after yours.    Again, thank you for choosing [facility].  Our hope is that these requests will decrease the amount of time that you wait before being seen by our physicians.       _____________________________________________________________  Should you have questions after your visit to [facility], please contact our office at [phone] between the hours of 8:30 a.m. and 5:00 p.m.  Voicemails left after 4:30 p.m. will not be returned until the following business day.  For prescription refill requests, have your pharmacy contact our office with your prescription refill request.      24-Hour Urine  Collection HOME CARE  When you get up in the morning on the day you do this test, pee (urinate) in the toilet and flush. Make a note of the time. This will be your start time on the day of collection and the end time on the next morning.   From then on, save all your pee (urine) in the plastic jug that was given to you.   You should stop collecting your pee 24 hours after you started.   If the plastic jug that is given to you already has liquid in it, that is okay. Do not throw out the liquid or rinse out the jug. Some tests need the liquid to be added to your pee.   Keep your plastic jug cool (in an ice chest or the refrigerator) during the test.   When the 24 hours is over, bring your plastic jug to the clinic lab. Keep the jug cool (in an ice chest) while you are bringing it to the lab.  Document Released: 10/15/2008 Document Revised: 10/11/2011 Document Reviewed: 10/15/2008 [facility] Patient Information 2013 [place], [place].

## 2013-08-20 ENCOUNTER — Other Ambulatory Visit (HOSPITAL_COMMUNITY): Payer: Medicare Other

## 2013-08-22 ENCOUNTER — Ambulatory Visit (HOSPITAL_COMMUNITY): Payer: Medicare Other

## 2013-08-29 ENCOUNTER — Encounter (HOSPITAL_COMMUNITY): Payer: Medicare HMO | Attending: Hematology and Oncology

## 2013-08-29 ENCOUNTER — Encounter (HOSPITAL_BASED_OUTPATIENT_CLINIC_OR_DEPARTMENT_OTHER): Payer: Medicare HMO

## 2013-08-29 ENCOUNTER — Encounter (HOSPITAL_COMMUNITY): Payer: Self-pay

## 2013-08-29 VITALS — BP 150/91 | HR 63 | Temp 97.8°F | Resp 20 | Wt 178.1 lb

## 2013-08-29 DIAGNOSIS — D472 Monoclonal gammopathy: Secondary | ICD-10-CM

## 2013-08-29 LAB — LACTATE DEHYDROGENASE: LDH: 227 U/L (ref 94–250)

## 2013-08-29 LAB — RETICULOCYTES
RBC.: 4.96 MIL/uL (ref 3.87–5.11)
RETIC COUNT ABSOLUTE: 69.4 10*3/uL (ref 19.0–186.0)
Retic Ct Pct: 1.4 % (ref 0.4–3.1)

## 2013-08-29 LAB — COMPREHENSIVE METABOLIC PANEL
ALK PHOS: 51 U/L (ref 39–117)
ALT: 19 U/L (ref 0–35)
AST: 18 U/L (ref 0–37)
Albumin: 3.6 g/dL (ref 3.5–5.2)
BILIRUBIN TOTAL: 0.3 mg/dL (ref 0.3–1.2)
BUN: 17 mg/dL (ref 6–23)
CALCIUM: 9.2 mg/dL (ref 8.4–10.5)
CO2: 28 meq/L (ref 19–32)
Chloride: 102 mEq/L (ref 96–112)
Creatinine, Ser: 0.92 mg/dL (ref 0.50–1.10)
GFR, EST AFRICAN AMERICAN: 73 mL/min — AB (ref >90)
GFR, EST NON AFRICAN AMERICAN: 63 mL/min — AB (ref >90)
GLUCOSE: 133 mg/dL — AB (ref 70–99)
POTASSIUM: 3.8 meq/L (ref 3.7–5.3)
Sodium: 141 mEq/L (ref 137–147)
Total Protein: 7.9 g/dL (ref 6.0–8.3)

## 2013-08-29 LAB — CBC WITH DIFFERENTIAL/PLATELET
BASOS ABS: 0 10*3/uL (ref 0.0–0.1)
BASOS PCT: 0 % (ref 0–1)
EOS ABS: 0.1 10*3/uL (ref 0.0–0.7)
EOS PCT: 2 % (ref 0–5)
HCT: 41 % (ref 36.0–46.0)
Hemoglobin: 13.3 g/dL (ref 12.0–15.0)
LYMPHS ABS: 1.9 10*3/uL (ref 0.7–4.0)
Lymphocytes Relative: 41 % (ref 12–46)
MCH: 26.8 pg (ref 26.0–34.0)
MCHC: 32.4 g/dL (ref 30.0–36.0)
MCV: 82.7 fL (ref 78.0–100.0)
Monocytes Absolute: 0.6 10*3/uL (ref 0.1–1.0)
Monocytes Relative: 13 % — ABNORMAL HIGH (ref 3–12)
NEUTROS PCT: 44 % (ref 43–77)
Neutro Abs: 2 10*3/uL (ref 1.7–7.7)
PLATELETS: 235 10*3/uL (ref 150–400)
RBC: 4.96 MIL/uL (ref 3.87–5.11)
RDW: 13.8 % (ref 11.5–15.5)
WBC: 4.6 10*3/uL (ref 4.0–10.5)

## 2013-08-29 NOTE — Progress Notes (Signed)
[facility]  OFFICE PROGRESS NOTE  Purvis Kilts, MD [address] [place] [address]  DIAGNOSIS: MGUS (monoclonal gammopathy of unknown significance) - Plan: CBC with Differential, Reticulocytes, Comprehensive metabolic panel, Lactate dehydrogenase, Immunofixation electrophoresis, Immunofixation, urine, Multiple myeloma panel, serum, Kappa/lambda light chains, CBC with Differential, Comprehensive metabolic panel, Lactate dehydrogenase, Immunofixation electrophoresis, Kappa/lambda light chains, Multiple myeloma panel, serum, Reticulocytes, Protein electrophoresis, urine, Protein, urine, 24 hour, Creatinine clearance, urine, 24 hour  Chief Complaint  Patient presents with  . MGUS    CURRENT THERAPY: Surveillance  INTERVAL HISTORY: Melanie Welch 68 y.o. 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  She did undergo bone marrow aspiration and biopsy 4.5 years ago and attempts will be to get that report to scan into the Epic system. Appetite is good with no bone pain, joint pain, fever, night sweats, melena, hematochezia, hematuria, vaginal bleeding, cough, sore throat, melena, hematochezia, vaginal bleeding, lower extremity swelling or redness, peripheral paresthesias, headache, or seizures.  MEDICAL HISTORY: Past Medical History  Diagnosis Date  . Diabetes mellitus   . Hypertension   . Proteinuria   . MGUS (monoclonal gammopathy of unknown significance)     INTERIM HISTORY:  does not have a problem list on file.    ALLERGIES:  has No Known Allergies.  MEDICATIONS: has a current medication list which includes the following prescription(s): amlodipine, metformin, and omega-3 fatty acids.  SURGICAL HISTORY:  Past Surgical History  Procedure  Laterality Date  . Abdominal hysterectomy    . Breast biopsy  2005    left  . Colonoscopy  07/13/2011    Procedure: COLONOSCOPY;  Surgeon: Dorothyann Peng, MD;  Location: [facility];  Service: Endoscopy;  Laterality: N/A;  9:30 AM    FAMILY HISTORY: family history includes Cancer in her mother and sister.  SOCIAL HISTORY:  reports that she has quit smoking. She has never used smokeless tobacco. She reports that she drinks alcohol. She reports that she does not use illicit drugs.  REVIEW OF SYSTEMS:  Other than that discussed above is noncontributory.  PHYSICAL EXAMINATION: ECOG PERFORMANCE STATUS: 0 - Asymptomatic  Blood pressure 150/91, pulse 63, temperature 97.8 F (36.6 C), resp. rate 20, weight 178 lb 1.6 oz (80.786 kg).  GENERAL:alert, no distress and comfortable SKIN: skin color, texture, turgor are normal, no rashes or significant lesions EYES: PERLA; Conjunctiva are pink and non-injected, sclera clear OROPHARYNX:no exudate, no erythema on lips, buccal mucosa, or tongue. NECK: supple, thyroid normal size, non-tender, without nodularity. No masses CHEST: Normal AP diameter with no breast masses. LYMPH:  no palpable lymphadenopathy in the cervical, axillary or inguinal LUNGS: clear to auscultation and percussion with normal breathing effort HEART: regular rate & rhythm and no murmurs. ABDOMEN:abdomen soft, non-tender and normal bowel sounds MUSCULOSKELETAL:no cyanosis of digits and no clubbing. Range of motion normal.  NEURO: alert & oriented x 3 with fluent speech, no focal motor/sensory deficits   LABORATORY DATA: Infusion on 08/29/2013  Component Date Value Range Status  . WBC 08/29/2013 4.6  4.0 - 10.5 K/uL Final  . RBC 08/29/2013 4.96  3.87 - 5.11 MIL/uL Final  . Hemoglobin 08/29/2013 13.3  12.0 - 15.0 g/dL Final  . HCT 08/29/2013 41.0  36.0 - 46.0 % Final  .  MCV 08/29/2013 82.7  78.0 - 100.0 fL Final  . MCH 08/29/2013 26.8  26.0 - 34.0 pg Final  . MCHC  08/29/2013 32.4  30.0 - 36.0 g/dL Final  . RDW 08/29/2013 13.8  11.5 - 15.5 % Final  . Platelets 08/29/2013 235  150 - 400 K/uL Final  . Neutrophils Relative % 08/29/2013 44  43 - 77 % Final  . Neutro Abs 08/29/2013 2.0  1.7 - 7.7 K/uL Final  . Lymphocytes Relative 08/29/2013 41  12 - 46 % Final  . Lymphs Abs 08/29/2013 1.9  0.7 - 4.0 K/uL Final  . Monocytes Relative 08/29/2013 13* 3 - 12 % Final  . Monocytes Absolute 08/29/2013 0.6  0.1 - 1.0 K/uL Final  . Eosinophils Relative 08/29/2013 2  0 - 5 % Final  . Eosinophils Absolute 08/29/2013 0.1  0.0 - 0.7 K/uL Final  . Basophils Relative 08/29/2013 0  0 - 1 % Final  . Basophils Absolute 08/29/2013 0.0  0.0 - 0.1 K/uL Final  . Sodium 08/29/2013 141  137 - 147 mEq/L Final  . Potassium 08/29/2013 3.8  3.7 - 5.3 mEq/L Final  . Chloride 08/29/2013 102  96 - 112 mEq/L Final  . CO2 08/29/2013 28  19 - 32 mEq/L Final  . Glucose, Bld 08/29/2013 133* 70 - 99 mg/dL Final  . BUN 08/29/2013 17  6 - 23 mg/dL Final  . Creatinine, Ser 08/29/2013 0.92  0.50 - 1.10 mg/dL Final  . Calcium 08/29/2013 9.2  8.4 - 10.5 mg/dL Final  . Total Protein 08/29/2013 7.9  6.0 - 8.3 g/dL Final  . Albumin 08/29/2013 3.6  3.5 - 5.2 g/dL Final  . AST 08/29/2013 18  0 - 37 U/L Final  . ALT 08/29/2013 19  0 - 35 U/L Final  . Alkaline Phosphatase 08/29/2013 51  39 - 117 U/L Final  . Total Bilirubin 08/29/2013 0.3  0.3 - 1.2 mg/dL Final  . GFR calc non Af Amer 08/29/2013 63* >90 mL/min Final  . GFR calc Af Amer 08/29/2013 73* >90 mL/min Final   Comment: (NOTE)                          The eGFR has been calculated using the CKD EPI equation.                          This calculation has not been validated in all clinical situations.                          eGFR's persistently <90 mL/min signify possible Chronic Kidney                          Disease.  Marland Kitchen LDH 08/29/2013 227  94 - 250 U/L Final  . Retic Ct Pct 08/29/2013 1.4  0.4 - 3.1 % Final  . RBC. 08/29/2013 4.96  3.87  - 5.11 MIL/uL Final  . Retic Count, Manual 08/29/2013 69.4  19.0 - 186.0 K/uL Final    PATHOLOGY: Specimen Collected: 06/03/09 12:01 AM Last Resulted: 06/04/09 2:33 PM Order Details View Encounter Lab and Collection Details Routing Result History            Surgical pathology converted EChart   Status: Final result Visible to patient: This result is not viewable by the patient. Next appt: None  Result Narrative    [place], [place] [address] [phone]  BONE MARROW REPORT  Case #: [id number] Patient Name: Melanie Welch, Melanie Welch Office Chart Number: N/A  MRN: [id number] Pathologist: Chrystie Nose. Saralyn Pilar, MD DOB/Age April 05, 1946 (Age: 59) Gender: F Date Taken: 06/03/2009 Date Received: 06/03/2009  FINAL DIAGNOSIS  MICROSCOPIC EXAMINATION AND DIAGNOSIS  BONE MARROW, RIGHT POSTERIOR SUPERIOR ILIAC CREST BIOPSY AND ASPIRATE: TRILINEAGE HEMATOPOIESIS WITH 8% PLASMA CELLS.  PERIPHERAL BLOOD: SLIGHT NEUTROPENIA.  gdt Date Reported: 06/04/2009 Chrystie Nose. Saralyn Pilar, MD  Electronically Signed Out By JDP   Clinical information Bence Jones proteinuria (-) bone survey; intermittent (sic) IgG proteinuria seen. (lw)  specimen(s) obtained Bone marrow, biopsy only, right posterior, superior iliac spinous process  Gross Description Received in formalin in one cassette is folded up lens paper containing no gross material. Block Summary: Submitted for routine histology in one cassette.  Received in formalin are two cores of red tan cancellous appearing bone that measure 0.5 and 0.6 cm in length x 0.15 cm in diameter. Adherent to the larger specimen is dark red clotted blood. Block Summary: Submitted in one cassette following decalcification. (BM:mw 06-03-09)  mw/ MICROSCOPIC DESCRIPTION LAB DATA: CBC performed on 06/03/09 shows:  WBC 3.9/ul HB 13.4g/dl HCT 40.2% MCV 81.4f RDW 13.9% PLT 181/ul  Additional lab data: N/A  PERIPHERAL BLOOD: The peripheral  smears show adequate numbers of platelets present. The red cells show no significant changes. The leukocytes show slightly decreased numbers of neutrophils. A 100 cell manual differential shows 38 neutrophils, 47 lymphocytes, 12 monocytes, and 3 eosinophils.  BONE MARROW ASPIRATE: The aspirate smears show cellular particles which demonstrate adequate numbers of megakaryocytes. The granulocytic series shows orderly and progressive maturation and the red cell precursors show no significant changes. There are occasional plasma cells present which have round, mature appearing features. There are also increased numbers of smudge cells present.  Bone Marrow Count performed on 500 cells shows:  Blasts: 0% Myeloid: 54% Promyelocytes: 2% Myelocytes: 8% Erythroid: 25% Metamyelocytes: 9% Bands: 10% Lymphocytes: 10% Neutrophils: 21% Eosinophils: 4% Plasma Cells: 8% Basophils: 0% Monocytes: 3% M:E Ratio: 2.2:1 TOUCH PREPARATIONS: Similar to aspirate smears.  SECTIONS OF CLOT: There is insufficient marrow cellularity in the clot sections for evaluation.  BONE MARROW CORE BIOPSY: The core biopsy shows cellular marrow with an estimated cellularity of 50 to 60%. There are adequate numbers of megakaryocytes present and the red cell precursors are present in the unusual proportions. There are mature granulocytes present. There are occasional plasma cells present which have round, mature appearing features. No aggregates or diffuse infiltrates of atypical plasma cells are identified.  IRON STAIN: Iron stains are performed on a bone marrow aspirate smear and section of clot. The controls stained appropriately.  Storage Iron: Present, decreased. Ring Sideroblasts: No.  COMMENT: The marrow show trilineage hematopoiesis and a 500 cell morphologic marrow differential shows 8% plasma cells. (JDP:gt, 06/04/09)  Addendum  Addendum Comment Immunohistochemistry for CD138 is performed and shows  presence of plasma cells estimated at 8 % to 12 % of the cellularity. Kappa and lambda stains are performed and are not contributory. (JDP:mj 06/05/09)  Some of these immunohistochemical stains may have been developed and the performance characteristics determined by [facility] Some may not have been cleared or approved by the U.S. Food and Drug Administration. The FDA has determined that such clearance or approval is not necessary. This test is used for clinical purposes. It should not be regarded as investigational or for  research. This laboratory is certified under the [place] (CLIA-88) as qualified to perform high complexity clinical laboratory testing.  Chrystie Nose. Saralyn Pilar, MD  DATE REPORTED:06/05/2009  Addendum  Addendum Comment Cytogenetic analysis performed at [facility] revealed the presence of normal female chromosomes with no observable clonal chromosomal abnormalities. Molecular cytogenetic analysis by [facility] shows results consistent with a finding a no molecular cytogenetic abnormalities. (JDP:mj 06/17/09)  Chrystie Nose. Saralyn Pilar, MD  DATE REPORTED:06/17/2009  BMBXO-Bone marrow, biopsy only, right posterior, superior iliac spinous process Immunohistochemistry for CD138 is performed and shows presence of plasma cells estimated at 8 % to 12 % of the cellularity. Kappa and lambda stains are performed and are not contributory. (JDP:mj 06/05/09)  Some of these immunohistochemical stains may have been developed and the performance characteristics determined by [facility]. Some may not have been cleared or approved by the U.S. Food and Drug Administration. The FDA has determined that such clearance or approval is not necessary. This test is used for clinical purposes. It should not be regarded as investigational or for research. This laboratory is certified under the [place] (CLIA-88) as qualified to perform high complexity clinical laboratory testing. Cytogenetic analysis performed at [facility] revealed the presence of normal female chromosomes with no observable clonal chromosomal abnormalities. Molecular cytogenetic analysis by [facility] shows results consistent with a finding a no molecular cytogenetic abnormalities    Urinalysis No results found for this basename: colorurine,  appearanceur,  labspec,  phurine,  glucoseu,  hgbur,  bilirubinur,  ketonesur,  proteinur,  urobilinogen,  nitrite,  leukocytesur    RADIOGRAPHIC STUDIES: 07/24/2008      Clinical Data: Abnormal labs.  METASTATIC BONE SURVEY  Technique: 1-2 views are performed of the axial and appendicular  skeleton, including the cervical, thoracic, and lumbar spine as  well as the long bones of the upper lower extremities.  Comparison: None  Findings: The bone mineral density appears normal. No discrete  lytic lesions are identified within the axial or appendicular  skeleton to indicate presence of osseous metastatic disease.  Degenerative changes are seen within the cervical, thoracic, and  lumbar spine.  IMPRESSION:  No evidence for osseous metastatic disease.  Provider: Claudia Pollock     ASSESSMENT:  #1. Monoclonal gammopathy of uncertain significance with small elevation in IgG lambda, not enough to quantify, status post bone marrow aspiration and biopsy 2 years ago with negative findings for myeloma. #2. Diabetes mellitus, type II, non-insulin requiring, controlled. #3. Hypertension, controlled.   PLAN:  #1. 24 urine will be collected and analyzed by immunofixation. #2. If lab tests done today and 24-hour urine determinations are acceptable, followup will be arranged in one year.    All questions were answered. The patient knows to call the clinic with any problems, questions or concerns. We can certainly see the patient much sooner  if necessary.   I spent 25 minutes counseling the patient face to face. The total time spent in the appointment was 30 minutes.    Doroteo Bradford, MD 08/29/2013 11:31 AM

## 2013-08-29 NOTE — Patient Instructions (Signed)
[facility] Discharge Instructions  RECOMMENDATIONS MADE BY THE CONSULTANT AND ANY TEST RESULTS WILL BE SENT TO YOUR REFERRING PHYSICIAN.  EXAM FINDINGS BY THE PHYSICIAN TODAY AND SIGNS OR SYMPTOMS TO REPORT TO CLINIC OR PRIMARY PHYSICIAN:   1 year follow up appointment for labs: August 29, 2014 @ 9:30 1 year follow up appointment with MD August 29, 2014 @ 10   Thank you for choosing [facility] to provide your oncology and hematology care.  To afford each patient quality time with our providers, please arrive at least 15 minutes before your scheduled appointment time.  With your help, our goal is to use those 15 minutes to complete the necessary work-up to ensure our physicians have the information they need to help with your evaluation and healthcare recommendations.    Effective January 1st, 2014, we ask that you re-schedule your appointment with our physicians should you arrive 10 or more minutes late for your appointment.  We strive to give you quality time with our providers, and arriving late affects you and other patients whose appointments are after yours.    Again, thank you for choosing [facility].  Our hope is that these requests will decrease the amount of time that you wait before being seen by our physicians.       _____________________________________________________________  Should you have questions after your visit to [facility], please contact our office at [phone] between the hours of 8:30 a.m. and 5:00 p.m.  Voicemails left after 4:30 p.m. will not be returned until the following business day.  For prescription refill requests, have your pharmacy contact our office with your prescription refill request.

## 2013-08-29 NOTE — Progress Notes (Signed)
Labs drawn today for cbc/diff,cmp,immunofixation,kllc,retic,mm

## 2013-08-30 ENCOUNTER — Emergency Department (HOSPITAL_COMMUNITY)
Admission: EM | Admit: 2013-08-30 | Discharge: 2013-08-30 | Disposition: A | Discharge status: Home / Self Care | Payer: No Typology Code available for payment source | Attending: Emergency Medicine | Admitting: Emergency Medicine

## 2013-08-30 ENCOUNTER — Encounter (HOSPITAL_COMMUNITY): Payer: Self-pay | Admitting: Emergency Medicine

## 2013-08-30 ENCOUNTER — Emergency Department (HOSPITAL_COMMUNITY): Payer: No Typology Code available for payment source

## 2013-08-30 DIAGNOSIS — S199XXA Unspecified injury of neck, initial encounter: Principal | ICD-10-CM

## 2013-08-30 DIAGNOSIS — IMO0002 Reserved for concepts with insufficient information to code with codable children: Secondary | ICD-10-CM | POA: Insufficient documentation

## 2013-08-30 DIAGNOSIS — I10 Essential (primary) hypertension: Secondary | ICD-10-CM | POA: Insufficient documentation

## 2013-08-30 DIAGNOSIS — Z87891 Personal history of nicotine dependence: Secondary | ICD-10-CM | POA: Insufficient documentation

## 2013-08-30 DIAGNOSIS — Y92410 Unspecified street and highway as the place of occurrence of the external cause: Secondary | ICD-10-CM | POA: Insufficient documentation

## 2013-08-30 DIAGNOSIS — S0993XA Unspecified injury of face, initial encounter: Secondary | ICD-10-CM | POA: Insufficient documentation

## 2013-08-30 DIAGNOSIS — E119 Type 2 diabetes mellitus without complications: Secondary | ICD-10-CM | POA: Insufficient documentation

## 2013-08-30 DIAGNOSIS — S99919A Unspecified injury of unspecified ankle, initial encounter: Secondary | ICD-10-CM

## 2013-08-30 DIAGNOSIS — Z79899 Other long term (current) drug therapy: Secondary | ICD-10-CM | POA: Insufficient documentation

## 2013-08-30 DIAGNOSIS — R209 Unspecified disturbances of skin sensation: Secondary | ICD-10-CM

## 2013-08-30 DIAGNOSIS — S8990XA Unspecified injury of unspecified lower leg, initial encounter: Secondary | ICD-10-CM | POA: Insufficient documentation

## 2013-08-30 DIAGNOSIS — S99929A Unspecified injury of unspecified foot, initial encounter: Secondary | ICD-10-CM

## 2013-08-30 DIAGNOSIS — Y9389 Activity, other specified: Secondary | ICD-10-CM | POA: Insufficient documentation

## 2013-08-30 LAB — CBC WITH DIFFERENTIAL/PLATELET
Basophils Absolute: 0 10*3/uL (ref 0.0–0.1)
Basophils Relative: 1 % (ref 0–1)
Eosinophils Absolute: 0.1 10*3/uL (ref 0.0–0.7)
Eosinophils Relative: 2 % (ref 0–5)
HCT: 39.1 % (ref 36.0–46.0)
Hemoglobin: 12.9 g/dL (ref 12.0–15.0)
LYMPHS ABS: 2 10*3/uL (ref 0.7–4.0)
Lymphocytes Relative: 40 % (ref 12–46)
MCH: 27.3 pg (ref 26.0–34.0)
MCHC: 33 g/dL (ref 30.0–36.0)
MCV: 82.7 fL (ref 78.0–100.0)
Monocytes Absolute: 0.6 10*3/uL (ref 0.1–1.0)
Monocytes Relative: 11 % (ref 3–12)
Neutro Abs: 2.3 10*3/uL (ref 1.7–7.7)
Neutrophils Relative %: 46 % (ref 43–77)
PLATELETS: 218 10*3/uL (ref 150–400)
RBC: 4.73 MIL/uL (ref 3.87–5.11)
RDW: 13.5 % (ref 11.5–15.5)
WBC: 5 10*3/uL (ref 4.0–10.5)

## 2013-08-30 LAB — COMPREHENSIVE METABOLIC PANEL
ALT: 20 U/L (ref 0–35)
AST: 20 U/L (ref 0–37)
Albumin: 3.6 g/dL (ref 3.5–5.2)
Alkaline Phosphatase: 52 U/L (ref 39–117)
BUN: 13 mg/dL (ref 6–23)
CO2: 26 mEq/L (ref 19–32)
Calcium: 8.5 mg/dL (ref 8.4–10.5)
Chloride: 104 mEq/L (ref 96–112)
Creatinine, Ser: 0.8 mg/dL (ref 0.50–1.10)
GFR calc non Af Amer: 75 mL/min — ABNORMAL LOW (ref >90)
GFR, EST AFRICAN AMERICAN: 86 mL/min — AB (ref >90)
Glucose, Bld: 112 mg/dL — ABNORMAL HIGH (ref 70–99)
Potassium: 4 mEq/L (ref 3.7–5.3)
SODIUM: 142 meq/L (ref 137–147)
Total Bilirubin: 0.2 mg/dL — ABNORMAL LOW (ref 0.3–1.2)
Total Protein: 7.5 g/dL (ref 6.0–8.3)

## 2013-08-30 MED ORDER — CYCLOBENZAPRINE HCL 10 MG PO TABS
10.0000 mg | ORAL_TABLET | Freq: Two times a day (BID) | ORAL | Status: DC | PRN
Start: 2013-08-30 — End: 2014-08-29

## 2013-08-30 MED ORDER — HYDROCODONE-ACETAMINOPHEN 5-325 MG PO TABS: 1.0000 | ORAL_TABLET | ORAL | Status: DC | PRN

## 2013-08-30 NOTE — ED Notes (Signed)
Per Neurology, pt appropriate for discharge.
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
## 2013-08-30 NOTE — ED Notes (Signed)
MD at bedside. 

## 2013-08-30 NOTE — ED Notes (Signed)
Pt reports decrease in numbness to lip.
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
## 2013-08-30 NOTE — ED Notes (Signed)
Neurology at bedside.

## 2013-08-31 LAB — MULTIPLE MYELOMA PANEL, SERUM
Albumin ELP: 54.7 % — ABNORMAL LOW (ref 55.8–66.1)
Alpha-1-Globulin: 4 % (ref 2.9–4.9)
Alpha-2-Globulin: 9.5 % (ref 7.1–11.8)
Beta 2: 5.6 % (ref 3.2–6.5)
Beta Globulin: 6.6 % (ref 4.7–7.2)
Gamma Globulin: 19.6 % — ABNORMAL HIGH (ref 11.1–18.8)
IGA: 259 mg/dL (ref 69–380)
IgG (Immunoglobin G), Serum: 1520 mg/dL (ref 690–1700)
IgM, Serum: 205 mg/dL (ref 52–322)
M-Spike, %: 0.36 g/dL
TOTAL PROTEIN: 7.2 g/dL (ref 6.0–8.3)

## 2013-08-31 LAB — KAPPA/LAMBDA LIGHT CHAINS
KAPPA, LAMDA LIGHT CHAIN RATIO: 0.87 (ref 0.26–1.65)
Kappa free light chain: 1.86 mg/dL (ref 0.33–1.94)
LAMDA FREE LIGHT CHAINS: 2.15 mg/dL (ref 0.57–2.63)

## 2013-08-31 LAB — IMMUNOFIXATION ELECTROPHORESIS
IgA: 272 mg/dL (ref 69–380)
IgG (Immunoglobin G), Serum: 1510 mg/dL (ref 690–1700)
IgM, Serum: 202 mg/dL (ref 52–322)
Total Protein ELP: 7.1 g/dL (ref 6.0–8.3)
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
## 2013-09-11 ENCOUNTER — Other Ambulatory Visit (HOSPITAL_COMMUNITY): Payer: Self-pay | Admitting: *Deleted

## 2013-09-11 ENCOUNTER — Encounter (HOSPITAL_COMMUNITY): Payer: Medicare HMO | Attending: Hematology and Oncology

## 2013-09-11 ENCOUNTER — Encounter (HOSPITAL_COMMUNITY): Payer: Self-pay

## 2013-09-11 DIAGNOSIS — D472 Monoclonal gammopathy: Secondary | ICD-10-CM | POA: Diagnosis present

## 2013-09-11 LAB — PROTEIN, URINE, 24 HOUR
Collection Interval-UPROT: 24 hours
Protein, 24H Urine: 105 mg/d — ABNORMAL HIGH (ref 50–100)
Protein, Urine: 6 mg/dL
Urine Total Volume-UPROT: 1750 mL

## 2013-09-13 LAB — UIFE/LIGHT CHAINS/TP QN, 24-HR UR
ALPHA 1 UR: DETECTED — AB
Albumin, U: DETECTED
Alpha 2, Urine: DETECTED — AB
Beta, Urine: DETECTED — AB
FREE KAPPA LT CHAINS, UR: 3.68 mg/dL — AB (ref 0.14–2.42)
FREE LAMBDA EXCRETION/DAY: 10.5 mg/d
FREE LT CHN EXCR RATE: 64.4 mg/d
Free Kappa/Lambda Ratio: 6.13 ratio (ref 2.04–10.37)
Free Lambda Lt Chains,Ur: 0.6 mg/dL (ref 0.02–0.67)
Gamma Globulin, Urine: DETECTED — AB
TIME-UPE24: 24 h
Total Protein, Urine-Ur/day: 84 mg/d (ref 10–140)
Total Protein, Urine: 4.8 mg/dL
VOLUME, URINE-UPE24: 1750 mL

## 2013-09-13 LAB — IMMUNOFIXATION, URINE

## 2014-08-28 DIAGNOSIS — D472 Monoclonal gammopathy: Secondary | ICD-10-CM | POA: Insufficient documentation

## 2014-08-28 NOTE — Progress Notes (Signed)
Purvis Kilts, MD [place] [id number]  MGUS (monoclonal gammopathy of unknown significance) - Plan: DG Bone Survey Met, CBC with Differential, Reticulocytes, Comprehensive metabolic panel, Lactate dehydrogenase, C-reactive protein, Protein electrophoresis, urine, Protein, urine, 24 hour, Creatinine clearance, urine, 24 hour, Immunofixation, urine, Beta 2 microglobuline, serum, Multiple myeloma panel, serum, Kappa/lambda light chains  Palpable thyroid - Plan: TSH, US Soft Tissue Head/Neck, TSH  CURRENT THERAPY: Surveillance  INTERVAL HISTORY: Melanie Welch 69 y.o. 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    I personally reviewed and went over laboratory results with the patient.  The results are noted within this dictation.  Most labs are pending.   She is overdue for a skeletal survey and I will get that set up today.  She feels great and is doing well.  Her family history is significant for a sister with multiple myeloma.   Hematologically, she denies any complaints and ROS questioning is negative.  Past Medical History  Diagnosis Date  . Diabetes mellitus   . Hypertension   . Proteinuria   . MGUS (monoclonal gammopathy of unknown significance)     has MGUS (monoclonal gammopathy of unknown significance) and Palpable thyroid on her problem list.     has No Known Allergies.  Ms. Toohey had no medications administered during this visit.  Past Surgical History  Procedure Laterality Date  . Abdominal hysterectomy    . Breast biopsy  2005    left  . Colonoscopy  07/13/2011    Procedure: COLONOSCOPY;  Surgeon: Dorothyann Peng, MD;  [facility];  Service: Endoscopy;  Laterality: N/A;  9:30 AM    Denies any headaches, dizziness, double vision, fevers,  chills, night sweats, nausea, vomiting, diarrhea, constipation, chest pain, heart palpitations, shortness of breath, blood in stool, black tarry stool, urinary pain, urinary burning, urinary frequency, hematuria.   PHYSICAL EXAMINATION  ECOG PERFORMANCE STATUS: 0 - Asymptomatic  Filed Vitals:   08/29/14 1110  BP: 133/70  Pulse: 76  Temp: 98 F (36.7 C)  Resp: 18    GENERAL:alert, no distress, well nourished, well developed, comfortable, cooperative and smiling SKIN: skin color, texture, turgor are normal, no rashes or significant lesions HEAD: Normocephalic, No masses, lesions, tenderness or abnormalities EYES: normal, PERRLA, EOMI, Conjunctiva are pink and non-injected EARS: External ears normal OROPHARYNX:lips, buccal mucosa, and tongue normal and mucous membranes are moist  NECK: supple, no adenopathy, thyromegaly noted bilaterally LYMPH:  no palpable lymphadenopathy BREAST:not examined LUNGS: clear to auscultation and percussion HEART: regular rate & rhythm, no murmurs, no gallops, S1 normal and S2 normal ABDOMEN:abdomen soft, non-tender and normal bowel sounds BACK: Back symmetric, no curvature. EXTREMITIES:less then 2 second capillary refill, no joint deformities, effusion, or inflammation, no skin discoloration, no clubbing, no cyanosis  NEURO: alert & oriented x 3 with fluent speech, no focal motor/sensory deficits, gait normal   LABORATORY DATA: CBC    Component Value Date/Time   WBC 5.3 08/29/2014 1056   RBC 4.79 08/29/2014 1056   RBC 4.79 08/29/2014 1056   HGB 12.8 08/29/2014 1056   HCT 40.3 08/29/2014 1056   PLT 234 08/29/2014 1056   MCV 84.1 08/29/2014 1056   MCH 26.7 08/29/2014 1056   MCHC 31.8 08/29/2014 1056   RDW 14.0 08/29/2014 1056   LYMPHSABS  2.3 08/29/2014 1056   MONOABS 0.5 08/29/2014 1056   EOSABS 0.2 08/29/2014 1056   BASOSABS 0.1 08/29/2014 1056      Chemistry      Component Value Date/Time   NA 140 08/29/2014 1056   K 3.4* 08/29/2014  1056   CL 105 08/29/2014 1056   CO2 29 08/29/2014 1056   BUN 15 08/29/2014 1056   CREATININE 0.98 08/29/2014 1056   CREATININE 0.99 06/30/2010 0945      Component Value Date/Time   CALCIUM 9.2 08/29/2014 1056   ALKPHOS 46 08/29/2014 1056   AST 21 08/29/2014 1056   ALT 26 08/29/2014 1056   BILITOT 0.6 08/29/2014 1056         ASSESSMENT AND PLAN:  MGUS (monoclonal gammopathy of unknown significance) Monoclonal gammopathy of uncertain significance with small elevation in IgG lambda, not enough to quantify, status post bone marrow aspiration and biopsy in Nov 2010 showing trilineage hematopoiesis with 8% plasma cells.  Labs performed today: CBC diff, CMET, LDH, Retic, MM panel.  UPEP ordered and patient will collect urine (24 hr) and return the specimen.  Repeat labs in 12 months: CBC diff, CMET, LDH, Retic Count, MM panel, UPEP + IFE, urine protein, creatinine clearance.  Skeletal survey within the next month.   Palpable thyroid On exam, I feel her thyroid gland bilaterally.  She notes that she reported it to her primary care provider without work-up.  TSH added today.  [place] of neck ordered.  If abnormal, will refer to ENT/Endocrinology if needed.   THERAPY PLAN:  We will continue to monitor labs.  She is overdue for skeletal survey and therefore, I will get that set-up in the near future. I will also work-up her thyromegaly.  All questions were answered. The patient knows to call the clinic with any problems, questions or concerns. We can certainly see the patient much sooner if necessary.  Patient and plan discussed with Dr. Ancil Linsey and she is in agreement with the aforementioned.   Hernando Reali 08/29/2014
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
## 2014-08-29 ENCOUNTER — Other Ambulatory Visit (HOSPITAL_COMMUNITY): Payer: Self-pay | Admitting: Oncology

## 2014-08-29 ENCOUNTER — Encounter (HOSPITAL_COMMUNITY): Payer: Commercial Managed Care - HMO | Attending: Oncology | Admitting: Oncology

## 2014-08-29 ENCOUNTER — Encounter (HOSPITAL_COMMUNITY): Payer: Commercial Managed Care - HMO | Attending: Hematology & Oncology

## 2014-08-29 ENCOUNTER — Encounter (HOSPITAL_COMMUNITY): Payer: Self-pay | Admitting: Oncology

## 2014-08-29 ENCOUNTER — Other Ambulatory Visit (HOSPITAL_COMMUNITY): Payer: Self-pay

## 2014-08-29 VITALS — BP 133/70 | HR 76 | Temp 98.0°F | Resp 18 | Wt 178.2 lb

## 2014-08-29 DIAGNOSIS — I10 Essential (primary) hypertension: Secondary | ICD-10-CM | POA: Diagnosis not present

## 2014-08-29 DIAGNOSIS — Z87898 Personal history of other specified conditions: Secondary | ICD-10-CM | POA: Insufficient documentation

## 2014-08-29 DIAGNOSIS — Z09 Encounter for follow-up examination after completed treatment for conditions other than malignant neoplasm: Secondary | ICD-10-CM | POA: Insufficient documentation

## 2014-08-29 DIAGNOSIS — E119 Type 2 diabetes mellitus without complications: Secondary | ICD-10-CM | POA: Insufficient documentation

## 2014-08-29 DIAGNOSIS — E0789 Other specified disorders of thyroid: Secondary | ICD-10-CM | POA: Diagnosis not present

## 2014-08-29 DIAGNOSIS — R809 Proteinuria, unspecified: Secondary | ICD-10-CM | POA: Diagnosis not present

## 2014-08-29 DIAGNOSIS — D472 Monoclonal gammopathy: Secondary | ICD-10-CM | POA: Diagnosis not present

## 2014-08-29 DIAGNOSIS — E876 Hypokalemia: Secondary | ICD-10-CM

## 2014-08-29 LAB — CBC WITH DIFFERENTIAL/PLATELET
Basophils Absolute: 0.1 10*3/uL (ref 0.0–0.1)
Basophils Relative: 1 % (ref 0–1)
EOS ABS: 0.2 10*3/uL (ref 0.0–0.7)
EOS PCT: 3 % (ref 0–5)
HCT: 40.3 % (ref 36.0–46.0)
Hemoglobin: 12.8 g/dL (ref 12.0–15.0)
LYMPHS ABS: 2.3 10*3/uL (ref 0.7–4.0)
Lymphocytes Relative: 43 % (ref 12–46)
MCH: 26.7 pg (ref 26.0–34.0)
MCHC: 31.8 g/dL (ref 30.0–36.0)
MCV: 84.1 fL (ref 78.0–100.0)
MONO ABS: 0.5 10*3/uL (ref 0.1–1.0)
Monocytes Relative: 10 % (ref 3–12)
NEUTROS ABS: 2.2 10*3/uL (ref 1.7–7.7)
NEUTROS PCT: 43 % (ref 43–77)
PLATELETS: 234 10*3/uL (ref 150–400)
RBC: 4.79 MIL/uL (ref 3.87–5.11)
RDW: 14 % (ref 11.5–15.5)
WBC: 5.3 10*3/uL (ref 4.0–10.5)

## 2014-08-29 LAB — COMPREHENSIVE METABOLIC PANEL
ALBUMIN: 4 g/dL (ref 3.5–5.2)
ALT: 26 U/L (ref 0–35)
AST: 21 U/L (ref 0–37)
Alkaline Phosphatase: 46 U/L (ref 39–117)
Anion gap: 6 (ref 5–15)
BUN: 15 mg/dL (ref 6–23)
CO2: 29 mmol/L (ref 19–32)
CREATININE: 0.98 mg/dL (ref 0.50–1.10)
Calcium: 9.2 mg/dL (ref 8.4–10.5)
Chloride: 105 mmol/L (ref 96–112)
GFR calc Af Amer: 67 mL/min — ABNORMAL LOW (ref >90)
GFR calc non Af Amer: 58 mL/min — ABNORMAL LOW (ref >90)
Glucose, Bld: 159 mg/dL — ABNORMAL HIGH (ref 70–99)
Potassium: 3.4 mmol/L — ABNORMAL LOW (ref 3.5–5.1)
SODIUM: 140 mmol/L (ref 135–145)
TOTAL PROTEIN: 7.9 g/dL (ref 6.0–8.3)
Total Bilirubin: 0.6 mg/dL (ref 0.3–1.2)

## 2014-08-29 LAB — RETICULOCYTES
RBC.: 4.79 MIL/uL (ref 3.87–5.11)
RETIC CT PCT: 1.4 % (ref 0.4–3.1)
Retic Count, Absolute: 67.1 10*3/uL (ref 19.0–186.0)

## 2014-08-29 LAB — TSH: TSH: 0.678 u[IU]/mL (ref 0.350–4.500)

## 2014-08-29 LAB — LACTATE DEHYDROGENASE: LDH: 126 U/L (ref 94–250)

## 2014-08-29 MED ORDER — POTASSIUM CHLORIDE CRYS ER 20 MEQ PO TBCR: 20.0000 meq | EXTENDED_RELEASE_TABLET | Freq: Once | ORAL | Status: DC

## 2014-08-29 NOTE — Patient Instructions (Signed)
[place] at [facility]  Discharge Instructions:  Due to your thickening in neck, we will set you up for an ultrasound of neck. We will perform a thyroid blood test. We will set you up for a bone xray. Repeat labs in 12 months. Return in 12 months.  If thyroid testing is abnormal, then we will let your primary care provider take care of this. _______________________________________________________________  Thank you for choosing [place] at [facility] to provide your oncology and hematology care.  To afford each patient quality time with our providers, please arrive at least 15 minutes before your scheduled appointment.  You need to re-schedule your appointment if you arrive 10 or more minutes late.  We strive to give you quality time with our providers, and arriving late affects you and other patients whose appointments are after yours.  Also, if you no show three or more times for appointments you may be dismissed from the clinic.  Again, thank you for choosing [place] at [place] hope is that these requests will allow you access to exceptional care and in a timely manner. _______________________________________________________________  If you have questions after your visit, please contact our office at [phone] between the hours of 8:30 a.m. and 5:00 p.m. Voicemails left after 4:30 p.m. will not be returned until the following business day. _______________________________________________________________  For prescription refill requests, have your pharmacy contact our office. _______________________________________________________________  Recommendations made by the consultant and any test results will be sent to your referring physician. _______________________________________________________________

## 2014-08-29 NOTE — Progress Notes (Signed)
LABS FOR RETIC,MM,SIFX,KLLC,LDH,CBCD,CMP

## 2014-08-29 NOTE — Assessment & Plan Note (Signed)
On exam, I feel her thyroid gland bilaterally.  She notes that she reported it to her primary care provider without work-up.  TSH added today.  [place] of neck ordered.  If abnormal, will refer to ENT/Endocrinology if needed.

## 2014-08-30 LAB — KAPPA/LAMBDA LIGHT CHAINS
Kappa free light chain: 42.04 mg/L — ABNORMAL HIGH (ref 3.30–19.40)
Kappa, lambda light chain ratio: 2.06 — ABNORMAL HIGH (ref 0.26–1.65)
Lambda free light chains: 20.39 mg/L (ref 5.71–26.30)

## 2014-08-30 LAB — HEMOGLOBIN A1C
Hgb A1c MFr Bld: 5.9 % — ABNORMAL HIGH (ref 4.8–5.6)
MEAN PLASMA GLUCOSE: 123 mg/dL

## 2014-08-30 NOTE — Progress Notes (Signed)
Talked with pt about lab work and told pt that TSH was good and wait for her [place] results and her potassium was a little low and we prescribed her some potassium to [facility] to take for 30 days.

## 2014-09-02 ENCOUNTER — Ambulatory Visit (HOSPITAL_COMMUNITY): Admission: RE | Admit: 2014-09-02 | Payer: Commercial Managed Care - HMO | Source: Ambulatory Visit

## 2014-09-02 ENCOUNTER — Other Ambulatory Visit (HOSPITAL_COMMUNITY): Payer: Commercial Managed Care - HMO

## 2014-09-02 LAB — MULTIPLE MYELOMA PANEL, SERUM
ALBUMIN ELP: 54.3 % — AB (ref 55.8–66.1)
Alpha-1-Globulin: 4 % (ref 2.9–4.9)
Alpha-2-Globulin: 9.6 % (ref 7.1–11.8)
BETA 2: 5.8 % (ref 3.2–6.5)
Beta Globulin: 6.5 % (ref 4.7–7.2)
GAMMA GLOBULIN: 19.8 % — AB (ref 11.1–18.8)
IGA: 264 mg/dL (ref 69–380)
IGG (IMMUNOGLOBIN G), SERUM: 1450 mg/dL (ref 690–1700)
IgM, Serum: 182 mg/dL (ref 52–322)
M-SPIKE, %: 0.23 g/dL
Total Protein: 7.3 g/dL (ref 6.0–8.3)

## 2014-09-03 ENCOUNTER — Other Ambulatory Visit (HOSPITAL_COMMUNITY)
Admission: RE | Admit: 2014-09-03 | Discharge: 2014-09-03 | Disposition: A | Discharge status: Home / Self Care | Payer: Commercial Managed Care - HMO | Source: Ambulatory Visit | Attending: Family Medicine | Admitting: Family Medicine

## 2014-09-03 DIAGNOSIS — R809 Proteinuria, unspecified: Secondary | ICD-10-CM | POA: Insufficient documentation

## 2014-09-04 ENCOUNTER — Encounter (HOSPITAL_COMMUNITY): Payer: Commercial Managed Care - HMO | Attending: Hematology & Oncology

## 2014-09-04 DIAGNOSIS — D472 Monoclonal gammopathy: Secondary | ICD-10-CM | POA: Insufficient documentation

## 2014-09-04 LAB — PROTEIN, URINE, 24 HOUR
Collection Interval-UPROT: 24 hours
Protein, 24H Urine: 170 mg/d — ABNORMAL HIGH (ref 50–100)
Protein, Urine: 8 mg/dL
Urine Total Volume-UPROT: 2125 mL

## 2014-09-04 NOTE — Progress Notes (Signed)
Dropped off 24hr urine for cr cl, pr electro,immunof, prot

## 2014-09-06 LAB — IMMUNOFIXATION, URINE

## 2014-09-09 LAB — UIFE/LIGHT CHAINS/TP QN, 24-HR UR
ALBUMIN, U: DETECTED
Alpha 1, Urine: DETECTED — AB
Alpha 2, Urine: DETECTED — AB
Beta, Urine: DETECTED — AB
GAMMA UR: DETECTED — AB
TOTAL PROTEIN, URINE-UPE24: 10 mg/dL (ref 5–24)
TOTAL PROTEIN, URINE-UR/DAY: 213 mg/d — AB (ref <150)
Time: 24 hours
VOLUME, URINE-UPE24: 2125 mL

## 2014-09-10 ENCOUNTER — Other Ambulatory Visit (HOSPITAL_COMMUNITY): Payer: Self-pay | Admitting: Oncology

## 2014-09-10 ENCOUNTER — Other Ambulatory Visit (HOSPITAL_COMMUNITY): Payer: Self-pay | Admitting: *Deleted

## 2014-09-10 DIAGNOSIS — D472 Monoclonal gammopathy: Secondary | ICD-10-CM

## 2014-10-04 ENCOUNTER — Ambulatory Visit (HOSPITAL_COMMUNITY)
Admission: RE | Admit: 2014-10-04 | Discharge: 2014-10-04 | Disposition: A | Discharge status: Home / Self Care | Payer: Commercial Managed Care - HMO | Source: Ambulatory Visit | Attending: Oncology | Admitting: Oncology

## 2014-10-04 DIAGNOSIS — E042 Nontoxic multinodular goiter: Secondary | ICD-10-CM | POA: Diagnosis not present

## 2014-10-04 DIAGNOSIS — E0789 Other specified disorders of thyroid: Secondary | ICD-10-CM | POA: Diagnosis not present

## 2014-10-08 ENCOUNTER — Encounter (HOSPITAL_COMMUNITY): Payer: Self-pay | Admitting: Lab

## 2014-10-08 NOTE — Progress Notes (Signed)
Referral sent to Dr Benjamine Mola.  Records faxed on 3/8

## 2014-10-09 DIAGNOSIS — E663 Overweight: Secondary | ICD-10-CM | POA: Diagnosis not present

## 2014-10-09 DIAGNOSIS — Z6826 Body mass index (BMI) 26.0-26.9, adult: Secondary | ICD-10-CM | POA: Diagnosis not present

## 2014-10-09 DIAGNOSIS — E559 Vitamin D deficiency, unspecified: Secondary | ICD-10-CM | POA: Diagnosis not present

## 2014-10-09 DIAGNOSIS — E042 Nontoxic multinodular goiter: Secondary | ICD-10-CM | POA: Diagnosis not present

## 2014-10-11 ENCOUNTER — Other Ambulatory Visit (HOSPITAL_COMMUNITY): Payer: Self-pay | Admitting: Oncology

## 2014-11-13 DIAGNOSIS — J301 Allergic rhinitis due to pollen: Secondary | ICD-10-CM | POA: Diagnosis not present

## 2014-11-13 DIAGNOSIS — E663 Overweight: Secondary | ICD-10-CM | POA: Diagnosis not present

## 2014-11-13 DIAGNOSIS — Z6827 Body mass index (BMI) 27.0-27.9, adult: Secondary | ICD-10-CM | POA: Diagnosis not present

## 2014-12-02 ENCOUNTER — Other Ambulatory Visit (HOSPITAL_COMMUNITY): Payer: Commercial Managed Care - HMO

## 2014-12-09 ENCOUNTER — Encounter (HOSPITAL_BASED_OUTPATIENT_CLINIC_OR_DEPARTMENT_OTHER): Payer: Commercial Managed Care - HMO

## 2014-12-09 ENCOUNTER — Encounter (HOSPITAL_COMMUNITY): Payer: Commercial Managed Care - HMO | Attending: Hematology & Oncology | Admitting: Hematology & Oncology

## 2014-12-09 ENCOUNTER — Encounter (HOSPITAL_COMMUNITY): Payer: Self-pay | Admitting: Hematology & Oncology

## 2014-12-09 DIAGNOSIS — E041 Nontoxic single thyroid nodule: Secondary | ICD-10-CM

## 2014-12-09 DIAGNOSIS — D472 Monoclonal gammopathy: Secondary | ICD-10-CM

## 2014-12-09 LAB — COMPREHENSIVE METABOLIC PANEL
ALT: 17 U/L (ref 14–54)
ANION GAP: 9 (ref 5–15)
AST: 15 U/L (ref 15–41)
Albumin: 3.5 g/dL (ref 3.5–5.0)
Alkaline Phosphatase: 52 U/L (ref 38–126)
BILIRUBIN TOTAL: 0.4 mg/dL (ref 0.3–1.2)
BUN: 14 mg/dL (ref 6–20)
CHLORIDE: 103 mmol/L (ref 101–111)
CO2: 27 mmol/L (ref 22–32)
CREATININE: 0.84 mg/dL (ref 0.44–1.00)
Calcium: 9.3 mg/dL (ref 8.9–10.3)
GFR calc non Af Amer: >60 mL/min (ref >60)
GLUCOSE: 148 mg/dL — AB (ref 70–99)
POTASSIUM: 3.5 mmol/L (ref 3.5–5.1)
Sodium: 139 mmol/L (ref 135–145)
Total Protein: 7.5 g/dL (ref 6.5–8.1)

## 2014-12-09 LAB — CBC WITH DIFFERENTIAL/PLATELET
BASOS ABS: 0.1 10*3/uL (ref 0.0–0.1)
BASOS PCT: 1 % (ref 0–1)
EOS PCT: 3 % (ref 0–5)
Eosinophils Absolute: 0.2 10*3/uL (ref 0.0–0.7)
HCT: 39 % (ref 36.0–46.0)
Hemoglobin: 12.5 g/dL (ref 12.0–15.0)
Lymphocytes Relative: 36 % (ref 12–46)
Lymphs Abs: 2 10*3/uL (ref 0.7–4.0)
MCH: 26.7 pg (ref 26.0–34.0)
MCHC: 32.1 g/dL (ref 30.0–36.0)
MCV: 83.2 fL (ref 78.0–100.0)
MONO ABS: 0.6 10*3/uL (ref 0.1–1.0)
Monocytes Relative: 11 % (ref 3–12)
Neutro Abs: 2.7 10*3/uL (ref 1.7–7.7)
Neutrophils Relative %: 49 % (ref 43–77)
Platelets: 239 10*3/uL (ref 150–400)
RBC: 4.69 MIL/uL (ref 3.87–5.11)
RDW: 13.7 % (ref 11.5–15.5)
WBC: 5.5 10*3/uL (ref 4.0–10.5)

## 2014-12-09 LAB — RETICULOCYTES
RBC.: 4.69 MIL/uL (ref 3.87–5.11)
RETIC CT PCT: 1.1 % (ref 0.4–3.1)
Retic Count, Absolute: 51.6 10*3/uL (ref 19.0–186.0)

## 2014-12-09 LAB — LACTATE DEHYDROGENASE: LDH: 122 U/L (ref 98–192)

## 2014-12-09 LAB — C-REACTIVE PROTEIN: CRP: 1.4 mg/dL — ABNORMAL HIGH (ref <1.0)

## 2014-12-09 NOTE — Progress Notes (Signed)
LABS DRAWN

## 2014-12-09 NOTE — Progress Notes (Signed)
Melanie Kilts, MD [place] [id number]   DIAGNOSIS:  MGUS Thyroid nodules  CURRENT THERAPY: Surveillance    INTERVAL HISTORY: Melanie Welch 69 y.o. 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 Last SPEP/IEP in 08/2014 showed two monoclonal proteins.  I personally reviewed and went over laboratory results with the patient.  The results are noted within this dictation.  Most labs are pending.   She is overdue for a skeletal survey and I will get that set up today. She does not recall having one ordered at her last visit.    She feels great and is doing well.  Her family history is significant for a sister with multiple myeloma.  She did not go to ENT about thyroid.  She has had trouble falling asleep and staying asleep for the past 2 years.  She has been experiencing a chronic cough since the end of winter/early spring, and has developed allergies in the last 5 years since moving to [place]. Her sputum is clear, and she hasn't had a fever or rash. She says "at night it's drainage, in the morning it's a cough".  She signed up for the [facility] in [place] and regularly walks.   Last mammogram done last year.   Past Medical History  Diagnosis Date  . Diabetes mellitus   . Hypertension   . Proteinuria   . MGUS (monoclonal gammopathy of unknown significance)     has MGUS (monoclonal gammopathy of unknown significance) and Palpable thyroid on her problem list.     has No Known Allergies.  Melanie Welch had no medications administered during this visit.  Past Surgical History  Procedure Laterality Date  . Abdominal hysterectomy    . Breast biopsy  2005    left  . Colonoscopy  07/13/2011    Procedure: COLONOSCOPY;  Surgeon: Dorothyann Peng, MD;  Location[facility];   Service: Endoscopy;  Laterality: N/A;  9:30 AM   FAMILY HISTORY There is no one else in the family with thyroid issues. Sister, deceased, had multiple myeloma.  Denies any headaches, dizziness, double vision, fevers, chills, night sweats, nausea, vomiting, diarrhea, constipation, chest pain, heart palpitations, shortness of breath, blood in stool, black tarry stool, urinary pain, urinary burning, urinary frequency, hematuria. Denies fever or rash.  Positive for allergies. Positive for malaise/fatigue.   PHYSICAL EXAMINATION  ECOG PERFORMANCE STATUS: 0 - Asymptomatic  Filed Vitals:   12/09/14 1143  BP: 140/79  Pulse: 72  Temp: 98.7 F (37.1 C)  Resp: 16    GENERAL:alert, no distress, well nourished, well developed, comfortable, cooperative and smiling SKIN: skin color, texture, turgor are normal, no rashes or significant lesions HEAD: Normocephalic, No masses, lesions, tenderness or abnormalities EYES: normal, PERRLA, EOMI, Conjunctiva are pink and non-injected EARS: External ears normal OROPHARYNX:lips, buccal mucosa, and tongue normal and mucous membranes are moist  NECK: supple, no adenopathy, thyromegaly noted bilaterally LYMPH:  no palpable lymphadenopathy BREAST:not examined LUNGS: clear to auscultation and percussion HEART: regular rate & rhythm, no murmurs, no gallops, S1 normal and S2 normal ABDOMEN:abdomen soft, non-tender and normal bowel sounds BACK: Back symmetric, no curvature. EXTREMITIES:less then 2 second capillary refill, no joint deformities, effusion, or inflammation, no skin discoloration, no clubbing, no cyanosis  NEURO: alert & oriented x 3 with fluent  speech, no focal motor/sensory deficits, gait normal   LABORATORY DATA: CBC    Component Value Date/Time   WBC 5.5 12/09/2014 1311   RBC 4.69 12/09/2014 1311   RBC 4.69 12/09/2014 1311   HGB 12.5 12/09/2014 1311   HCT 39.0 12/09/2014 1311   PLT 239 12/09/2014 1311   MCV 83.2 12/09/2014 1311    MCH 26.7 12/09/2014 1311   MCHC 32.1 12/09/2014 1311   RDW 13.7 12/09/2014 1311   LYMPHSABS 2.0 12/09/2014 1311   MONOABS 0.6 12/09/2014 1311   EOSABS 0.2 12/09/2014 1311   BASOSABS 0.1 12/09/2014 1311      Chemistry      Component Value Date/Time   NA 139 12/09/2014 1311   K 3.5 12/09/2014 1311   CL 103 12/09/2014 1311   CO2 27 12/09/2014 1311   BUN 14 12/09/2014 1311   CREATININE 0.84 12/09/2014 1311   CREATININE 0.99 06/30/2010 0945      Component Value Date/Time   CALCIUM 9.3 12/09/2014 1311   ALKPHOS 52 12/09/2014 1311   AST 15 12/09/2014 1311   ALT 17 12/09/2014 1311   BILITOT 0.4 12/09/2014 1311     RADIOLOGY:  CLINICAL DATA: Neck thickening  EXAM: THYROID ULTRASOUND  TECHNIQUE: Ultrasound examination of the thyroid gland and adjacent soft tissues was performed.  COMPARISON: 09/03/2011  FINDINGS: Right thyroid lobe  Measurements: 4.8 x 2.0 x 2.5 cm. 4.0 x 1.7 x 2.2 cm heterogeneous mass in the upper pole.  Left thyroid lobe  Measurements: 7.2 x 3.3 x 4.4 cm. 5.8 x 3.0 x 3.9 cm mass extends poor is the thoracic inlet. It occupies most of the left lobe.  Isthmus  Thickness: 4 mm. No nodules visualized.  Lymphadenopathy  None visualized.  IMPRESSION: Bilateral large thyroid masses as described. Findings meet consensus criteria for biopsy. Ultrasound-guided fine needle aspiration should be considered, as per the consensus statement: Management of Thyroid Nodules Detected at [place]: Society of Radiologists in [place]. Radiology 2005; [id number].   Electronically Signed  By: Marybelle Killings M.D.  On: 10/04/2014 16:09    ASSESSMENT AND PLAN:  MGUS Family history of myeloma Thyroid nodules  She had questions regarding MGUS. Spent time explaining her laboratory results over the last several years. She was also given reading material on MGUS. I have encouraged her to please keep her appointment  for her myeloma survey and after our discussion she seemed to understand the importance of doing so.  I advised her I would call her if any of her laboratory studies come back and are significantly changed or of concern.  I again advised her she needs to go to ENT for further evaluation of her thyroid nodules. We will set up another appointment for her. We will plan on seeing her back again in 6 months with an office visit and repeat laboratory studies.  We will continue to monitor labs.  She is overdue for skeletal survey and therefore, I will get that set-up in the near future. I will also work-up her thyromegaly. ))  All questions were answered. The patient knows to call the clinic with any problems, questions or concerns. We can certainly see the patient much sooner if necessary.  This document serves as a record of services personally performed by Ancil Linsey, MD. It was created on her behalf by Arlyce Harman, a trained medical scribe. The creation of this record is based on the scribe's personal observations and the provider's statements to them. This document has been  checked and approved by the attending provider.  I have reviewed the above documentation for accuracy and completeness, and I agree with the above. This note was electronically signed.  Shannon Penland, MD 

## 2014-12-09 NOTE — Patient Instructions (Addendum)
[employer] at [facility]  Discharge Instructions:  Your exam was completed by Dr Whitney Muse Referral sent to ENT for abnormal thyroid ultrasound Blood work today Follow up with the doctor and blood work in 6 months DG bone survery to be completed.  You can go at any time to radiology for this. Please call the clinic if you have any questions or concerns  _______________________________________________________________  Thank you for choosing [place] at [facility] to provide your oncology and hematology care.  To afford each patient quality time with our providers, please arrive at least 15 minutes before your scheduled appointment.  You need to re-schedule your appointment if you arrive 10 or more minutes late.  We strive to give you quality time with our providers, and arriving late affects you and other patients whose appointments are after yours.  Also, if you no show three or more times for appointments you may be dismissed from the clinic.  Again, thank you for choosing [place] at [place] hope is that these requests will allow you access to exceptional care and in a timely manner. _______________________________________________________________  If you have questions after your visit, please contact our office at [phone] between the hours of 8:30 a.m. and 5:00 p.m. Voicemails left after 4:30 p.m. will not be returned until the following business day. _______________________________________________________________  For prescription refill requests, have your pharmacy contact our office. _______________________________________________________________  Recommendations made by the consultant and any test results will be sent to your referring physician. _______________________________________________________________

## 2014-12-10 ENCOUNTER — Encounter (HOSPITAL_COMMUNITY): Payer: Self-pay | Admitting: Lab

## 2014-12-10 LAB — PROTEIN ELECTROPHORESIS, SERUM
A/G Ratio: 0.9 (ref 0.7–2.0)
ALBUMIN ELP: 3.2 g/dL (ref 3.2–5.6)
ALPHA-2-GLOBULIN: 0.8 g/dL (ref 0.4–1.2)
Alpha-1-Globulin: 0.3 g/dL (ref 0.1–0.4)
Beta Globulin: 1.2 g/dL (ref 0.6–1.3)
GLOBULIN, TOTAL: 3.6 g/dL (ref 2.0–4.5)
Gamma Globulin: 1.3 g/dL (ref 0.5–1.6)
TOTAL PROTEIN ELP: 6.8 g/dL (ref 6.0–8.5)

## 2014-12-10 LAB — BETA 2 MICROGLOBULIN, SERUM: Beta-2 Microglobulin: 1.9 mg/L (ref 0.6–2.4)

## 2014-12-10 LAB — IGG, IGA, IGM
IGA: 295 mg/dL (ref 87–352)
IgG (Immunoglobin G), Serum: 1416 mg/dL (ref 700–1600)
IgM, Serum: 215 mg/dL (ref 26–217)

## 2014-12-10 LAB — KAPPA/LAMBDA LIGHT CHAINS
Kappa free light chain: 35.65 mg/L — ABNORMAL HIGH (ref 3.30–19.40)
Kappa, lambda light chain ratio: 1.69 — ABNORMAL HIGH (ref 0.26–1.65)
LAMDA FREE LIGHT CHAINS: 21.06 mg/L (ref 5.71–26.30)

## 2014-12-10 NOTE — Progress Notes (Signed)
Referral made to Dr Benjamine Mola.  Records faxed on 5/10

## 2014-12-11 LAB — IMMUNOFIXATION ELECTROPHORESIS
IgA: 292 mg/dL (ref 87–352)
IgG (Immunoglobin G), Serum: 1411 mg/dL (ref 700–1600)
IgM, Serum: 218 mg/dL — ABNORMAL HIGH (ref 26–217)
Total Protein ELP: 6.9 g/dL (ref 6.0–8.5)

## 2014-12-12 LAB — MULTIPLE MYELOMA PANEL, SERUM
ALBUMIN SERPL ELPH-MCNC: 3.2 g/dL (ref 3.2–5.6)
ALPHA2 GLOB SERPL ELPH-MCNC: 0.8 g/dL (ref 0.4–1.2)
Albumin/Glob SerPl: 0.9 (ref 0.7–2.0)
Alpha 1: 0.3 g/dL (ref 0.1–0.4)
B-Globulin SerPl Elph-Mcnc: 1.3 g/dL (ref 0.6–1.3)
Gamma Glob SerPl Elph-Mcnc: 1.4 g/dL (ref 0.5–1.6)
Globulin, Total: 3.7 g/dL (ref 2.0–4.5)
IGA: 291 mg/dL (ref 87–352)
IGM, SERUM: 210 mg/dL (ref 26–217)
IgG (Immunoglobin G), Serum: 1419 mg/dL (ref 700–1600)
TOTAL PROTEIN ELP: 6.9 g/dL (ref 6.0–8.5)

## 2014-12-16 ENCOUNTER — Ambulatory Visit (HOSPITAL_COMMUNITY)
Admission: RE | Admit: 2014-12-16 | Discharge: 2014-12-16 | Disposition: A | Discharge status: Home / Self Care | Payer: Commercial Managed Care - HMO | Source: Ambulatory Visit | Attending: Hematology & Oncology | Admitting: Hematology & Oncology

## 2014-12-16 DIAGNOSIS — D472 Monoclonal gammopathy: Secondary | ICD-10-CM | POA: Insufficient documentation

## 2015-01-29 DIAGNOSIS — E782 Mixed hyperlipidemia: Secondary | ICD-10-CM | POA: Diagnosis not present

## 2015-01-29 DIAGNOSIS — Z6826 Body mass index (BMI) 26.0-26.9, adult: Secondary | ICD-10-CM | POA: Diagnosis not present

## 2015-01-29 DIAGNOSIS — E049 Nontoxic goiter, unspecified: Secondary | ICD-10-CM | POA: Diagnosis not present

## 2015-01-29 DIAGNOSIS — Z1389 Encounter for screening for other disorder: Secondary | ICD-10-CM | POA: Diagnosis not present

## 2015-01-29 DIAGNOSIS — E663 Overweight: Secondary | ICD-10-CM | POA: Diagnosis not present

## 2015-01-29 DIAGNOSIS — E559 Vitamin D deficiency, unspecified: Secondary | ICD-10-CM | POA: Diagnosis not present

## 2015-01-29 DIAGNOSIS — E1165 Type 2 diabetes mellitus with hyperglycemia: Secondary | ICD-10-CM | POA: Diagnosis not present

## 2015-02-05 ENCOUNTER — Other Ambulatory Visit (HOSPITAL_COMMUNITY): Payer: Self-pay | Admitting: Family Medicine

## 2015-02-05 DIAGNOSIS — Z1231 Encounter for screening mammogram for malignant neoplasm of breast: Secondary | ICD-10-CM

## 2015-02-17 ENCOUNTER — Ambulatory Visit (HOSPITAL_COMMUNITY)
Admission: RE | Admit: 2015-02-17 | Discharge: 2015-02-17 | Disposition: A | Discharge status: Home / Self Care | Payer: Commercial Managed Care - HMO | Source: Ambulatory Visit | Attending: Family Medicine | Admitting: Family Medicine

## 2015-02-17 DIAGNOSIS — Z1231 Encounter for screening mammogram for malignant neoplasm of breast: Secondary | ICD-10-CM | POA: Diagnosis not present

## 2015-03-21 DIAGNOSIS — E042 Nontoxic multinodular goiter: Secondary | ICD-10-CM | POA: Diagnosis not present

## 2015-03-24 ENCOUNTER — Other Ambulatory Visit: Payer: Self-pay | Admitting: "Endocrinology

## 2015-03-24 DIAGNOSIS — E041 Nontoxic single thyroid nodule: Secondary | ICD-10-CM

## 2015-04-16 ENCOUNTER — Ambulatory Visit (HOSPITAL_COMMUNITY): Admission: RE | Admit: 2015-04-16 | Payer: Commercial Managed Care - HMO | Source: Ambulatory Visit

## 2015-05-06 ENCOUNTER — Other Ambulatory Visit: Payer: Self-pay | Admitting: "Endocrinology

## 2015-05-06 DIAGNOSIS — E042 Nontoxic multinodular goiter: Secondary | ICD-10-CM

## 2015-05-15 ENCOUNTER — Ambulatory Visit (HOSPITAL_COMMUNITY)
Admission: RE | Admit: 2015-05-15 | Discharge: 2015-05-15 | Disposition: A | Discharge status: Home / Self Care | Payer: Commercial Managed Care - HMO | Source: Ambulatory Visit | Attending: "Endocrinology | Admitting: "Endocrinology

## 2015-05-15 DIAGNOSIS — E042 Nontoxic multinodular goiter: Secondary | ICD-10-CM | POA: Diagnosis not present

## 2015-05-15 DIAGNOSIS — E041 Nontoxic single thyroid nodule: Secondary | ICD-10-CM

## 2015-05-15 MED ORDER — LIDOCAINE HCL (PF) 2 % IJ SOLN
INTRAMUSCULAR | Status: AC
  Administered 2015-05-15: 10 mL
  Filled 2015-05-15: qty 10

## 2015-05-15 NOTE — Discharge Instructions (Signed)

## 2015-05-22 ENCOUNTER — Ambulatory Visit (INDEPENDENT_AMBULATORY_CARE_PROVIDER_SITE_OTHER): Payer: Commercial Managed Care - HMO | Admitting: "Endocrinology

## 2015-05-22 ENCOUNTER — Encounter: Payer: Self-pay | Admitting: "Endocrinology

## 2015-05-22 VITALS — BP 124/76 | HR 76 | Ht 68.0 in | Wt 177.0 lb

## 2015-05-22 DIAGNOSIS — Z23 Encounter for immunization: Secondary | ICD-10-CM | POA: Diagnosis not present

## 2015-05-22 DIAGNOSIS — E119 Type 2 diabetes mellitus without complications: Secondary | ICD-10-CM

## 2015-05-22 DIAGNOSIS — E042 Nontoxic multinodular goiter: Secondary | ICD-10-CM

## 2015-05-22 DIAGNOSIS — I10 Essential (primary) hypertension: Secondary | ICD-10-CM | POA: Diagnosis not present

## 2015-05-22 NOTE — Progress Notes (Signed)
Subjective:    Patient ID: Melanie Welch, female    DOB: [birth year],    Past Medical History  Diagnosis Date  . Diabetes mellitus   . Hypertension   . Proteinuria   . MGUS (monoclonal gammopathy of unknown significance)    Past Surgical History  Procedure Laterality Date  . Abdominal hysterectomy    . Breast biopsy  2005    left  . Colonoscopy  07/13/2011    Procedure: COLONOSCOPY;  Surgeon: Dorothyann Peng, MD;  [facility]: [facility];  Service: Endoscopy;  Laterality: N/A;  9:30 AM   Social History   Social History  . Marital Status: Divorced    Spouse Name: N/A  . Number of Children: N/A  . Years of Education: N/A   Social History Main Topics  . Smoking status: Former Research scientist (life sciences)  . Smokeless tobacco: Never Used  . Alcohol Use: Yes  . Drug Use: No  . Sexual Activity: Not Asked   Other Topics Concern  . None   Social History Narrative   Outpatient Encounter Prescriptions as of 05/22/2015  Medication Sig  . amLODipine (NORVASC) 10 MG tablet Take 10 mg by mouth daily.  Marland Kitchen co-enzyme Q-10 30 MG capsule Take 30 mg by mouth daily.  Marland Kitchen losartan (COZAAR) 100 MG tablet Take 100 mg by mouth daily.  . metFORMIN (GLUCOPHAGE) 500 MG tablet Take by mouth 2 (two) times daily with a meal.  . Omega-3 Fatty Acids (OMEGA 3 PO) Take 250 mg by mouth daily.  . Vitamin D, Cholecalciferol, 400 UNITS TABS Take by mouth.  . potassium chloride SA (K-DUR,KLOR-CON) 20 MEQ tablet Take 1 tablet (20 mEq total) by mouth once.  . simvastatin (ZOCOR) 20 MG tablet Take 20 mg by mouth at bedtime.  . vitamin C (ASCORBIC ACID) 250 MG tablet Take 250 mg by mouth 2 (two) times daily.  . [DISCONTINUED] losartan (COZAAR) 50 MG tablet Take 50 mg by mouth daily.  . [DISCONTINUED] metFORMIN (GLUMETZA) 500 MG (MOD) 24 hr tablet Take 500 mg by mouth daily with breakfast.     No facility-administered encounter medications on file as of 05/22/2015.   ALLERGIES: No Known Allergies VACCINATION  STATUS: Immunization History  Administered Date(s) Administered  . Influenza-Unspecified 05/29/2014    HPI  Melanie Welch is a 69 yr old female with medical hx as above. She is she is here to discuss her thyroid FNA results . The results have been reviewed and consistent with benign findings . She denies dysphagia, shortness of breath, voice change.  She denies family  hx of thyroid cancer.  she is not on any thyroid hormone nor antithyroid medication. she has controlled type 2 DM on MTF a1c 7.6%. she denies cold/heat intolerance. she has no acute symptoms currently.  Review of Systems  Objective:    BP 124/76 mmHg  Pulse 76  Ht 5' 8"  (1.727 m)  Wt 177 lb (80.287 kg)  BMI 26.92 kg/m2  SpO2 96%  Wt Readings from Last 3 Encounters:  05/22/15 177 lb (80.287 kg)  12/09/14 174 lb 11.2 oz (79.243 kg)  08/29/14 178 lb 3.2 oz (80.831 kg)    Physical Exam Constitutional: overweight, in NAD Eyes: PERRLA, EOMI, no exophthalmos ENT: moist mucous membranes, palpable thyroid.  no cervical lymphadenopathy Cardiovascular: RRR, No MRG Respiratory: CTA B Gastrointestinal: abdomen soft, NT, ND, BS+ Musculoskeletal: no deformities, strength intact in all 4 Skin: moist, warm, no rashes Neurological: no tremor with outstretched hands, DTR normal in all  4  Results for orders placed or performed in visit on 12/09/14  CBC with Differential  Result Value Ref Range   WBC 5.5 4.0 - 10.5 K/uL   RBC 4.69 3.87 - 5.11 MIL/uL   Hemoglobin 12.5 12.0 - 15.0 g/dL   HCT 39.0 36.0 - 46.0 %   MCV 83.2 78.0 - 100.0 fL   MCH 26.7 26.0 - 34.0 pg   MCHC 32.1 30.0 - 36.0 g/dL   RDW 13.7 11.5 - 15.5 %   Platelets 239 150 - 400 K/uL   Neutrophils Relative % 49 43 - 77 %   Neutro Abs 2.7 1.7 - 7.7 K/uL   Lymphocytes Relative 36 12 - 46 %   Lymphs Abs 2.0 0.7 - 4.0 K/uL   Monocytes Relative 11 3 - 12 %   Monocytes Absolute 0.6 0.1 - 1.0 K/uL   Eosinophils Relative 3 0 - 5 %   Eosinophils Absolute 0.2 0.0  - 0.7 K/uL   Basophils Relative 1 0 - 1 %   Basophils Absolute 0.1 0.0 - 0.1 K/uL  Reticulocytes  Result Value Ref Range   Retic Ct Pct 1.1 0.4 - 3.1 %   RBC. 4.69 3.87 - 5.11 MIL/uL   Retic Count, Manual 51.6 19.0 - 186.0 K/uL  Comprehensive metabolic panel  Result Value Ref Range   Sodium 139 135 - 145 mmol/L   Potassium 3.5 3.5 - 5.1 mmol/L   Chloride 103 101 - 111 mmol/L   CO2 27 22 - 32 mmol/L   Glucose, Bld 148 (H) 70 - 99 mg/dL   BUN 14 6 - 20 mg/dL   Creatinine, Ser 0.84 0.44 - 1.00 mg/dL   Calcium 9.3 8.9 - 10.3 mg/dL   Total Protein 7.5 6.5 - 8.1 g/dL   Albumin 3.5 3.5 - 5.0 g/dL   AST 15 15 - 41 U/L   ALT 17 14 - 54 U/L   Alkaline Phosphatase 52 38 - 126 U/L   Total Bilirubin 0.4 0.3 - 1.2 mg/dL   GFR calc non Af Amer >60 >60 mL/min   GFR calc Af Amer >60 >60 mL/min   Anion gap 9 5 - 15  Lactate dehydrogenase  Result Value Ref Range   LDH 122 98 - 192 U/L  C-reactive protein  Result Value Ref Range   CRP 1.4 (H) <1.0 mg/dL  Beta 2 microglobuline, serum  Result Value Ref Range   Beta-2 Microglobulin 1.9 0.6 - 2.4 mg/L  Multiple myeloma panel, serum  Result Value Ref Range   IgG (Immunoglobin G), Serum 1419 700 - 1600 mg/dL   IgA 291 87 - 352 mg/dL   IgM, Serum 210 26 - 217 mg/dL   Total Protein ELP 6.9 6.0 - 8.5 g/dL   Albumin SerPl Elph-Mcnc 3.2 3.2 - 5.6 g/dL   Alpha 1 0.3 0.1 - 0.4 g/dL   Alpha2 Glob SerPl Elph-Mcnc 0.8 0.4 - 1.2 g/dL   B-Globulin SerPl Elph-Mcnc 1.3 0.6 - 1.3 g/dL   Gamma Glob SerPl Elph-Mcnc 1.4 0.5 - 1.6 g/dL   M Protein SerPl Elph-Mcnc Not Observed Not Observed g/dL   Globulin, Total 3.7 2.0 - 4.5 g/dL   Albumin/Glob SerPl 0.9 0.7 - 2.0   IFE 1 Comment    Please Note Comment   Kappa/lambda light chains  Result Value Ref Range   Kappa free light chain 35.65 (H) 3.30 - 19.40 mg/L   Lamda free light chains 21.06 5.71 - 26.30 mg/L   Kappa, lamda light  chain ratio 1.69 (H) 0.26 - 1.65  IgG, IgA, IgM  Result Value Ref Range   IgG  (Immunoglobin G), Serum 1416 700 - 1600 mg/dL   IgA 295 87 - 352 mg/dL   IgM, Serum 215 26 - 217 mg/dL  Immunofixation electrophoresis  Result Value Ref Range   Total Protein ELP 6.9 6.0 - 8.5 g/dL   IgG (Immunoglobin G), Serum 1411 700 - 1600 mg/dL   IgA 292 87 - 352 mg/dL   IgM, Serum 218 (H) 26 - 217 mg/dL   Immunofixation Result, Serum Comment   Protein electrophoresis, serum  Result Value Ref Range   Total Protein ELP 6.8 6.0 - 8.5 g/dL   Albumin ELP 3.2 3.2 - 5.6 g/dL   Alpha-1-Globulin 0.3 0.1 - 0.4 g/dL   Alpha-2-Globulin 0.8 0.4 - 1.2 g/dL   Beta Globulin 1.2 0.6 - 1.3 g/dL   Gamma Globulin 1.3 0.5 - 1.6 g/dL   M-Spike, % Not Observed Not Observed g/dL   SPE Interp. Comment    Comment Comment    GLOBULIN, TOTAL 3.6 2.0 - 4.5 g/dL   A/G Ratio 0.9 0.7 - 2.0   Complete Blood Count (Most recent): Lab Results  Component Value Date   WBC 5.5 12/09/2014   HGB 12.5 12/09/2014   HCT 39.0 12/09/2014   MCV 83.2 12/09/2014   PLT 239 12/09/2014   Chemistry (most recent): Lab Results  Component Value Date   NA 139 12/09/2014   K 3.5 12/09/2014   CL 103 12/09/2014   CO2 27 12/09/2014   BUN 14 12/09/2014   CREATININE 0.84 12/09/2014   Diabetic Labs (most recent): Lab Results  Component Value Date   HGBA1C 5.9* 08/29/2014     Assessment & Plan:   1. Nontoxic multinodular goiter I have discussed the FNA results with her which are benign she would not need surgical treatment. Heart thyroid function tests are within normal limits she will not need thyroid hormone replacement for now. She will return in 1 year with repeat thyroid function tests for reevaluation and physical exam.  2. Diabetes mellitus without complication ([place]) She has well controlled type 2 diabetes. I advised her to stay on metformin 500 mg by mouth twice a day and follow up with her primary care physician. 3. Essential hypertension, benign Her blood pressure is controlled. I advised her to continue  amlodipine 10 mg by mouth daily.   I advised patient to maintain close follow up with their PCP for primary care needs. Follow up plan: Return in about 1 year (around 05/21/2016) for follow up with pre-visit labs , thyroid nodules.Glade Lloyd, MD Phone: [phone]  Fax: [phone]   05/22/2015, 8:23 PM

## 2015-06-05 ENCOUNTER — Ambulatory Visit: Payer: Self-pay | Admitting: "Endocrinology

## 2015-06-11 ENCOUNTER — Encounter (HOSPITAL_COMMUNITY): Payer: Self-pay | Admitting: Hematology & Oncology

## 2015-06-11 ENCOUNTER — Encounter (HOSPITAL_COMMUNITY): Payer: Commercial Managed Care - HMO | Attending: Hematology & Oncology | Admitting: Hematology & Oncology

## 2015-06-11 VITALS — BP 146/86 | HR 76 | Temp 98.4°F | Resp 18 | Wt 177.6 lb

## 2015-06-11 DIAGNOSIS — D472 Monoclonal gammopathy: Secondary | ICD-10-CM | POA: Insufficient documentation

## 2015-06-11 DIAGNOSIS — Z807 Family history of other malignant neoplasms of lymphoid, hematopoietic and related tissues: Secondary | ICD-10-CM | POA: Diagnosis not present

## 2015-06-11 DIAGNOSIS — E041 Nontoxic single thyroid nodule: Secondary | ICD-10-CM | POA: Diagnosis not present

## 2015-06-11 LAB — COMPREHENSIVE METABOLIC PANEL
ALBUMIN: 3.7 g/dL (ref 3.5–5.0)
ALK PHOS: 38 U/L (ref 38–126)
ALT: 19 U/L (ref 14–54)
AST: 17 U/L (ref 15–41)
Anion gap: 8 (ref 5–15)
BILIRUBIN TOTAL: 0.5 mg/dL (ref 0.3–1.2)
BUN: 12 mg/dL (ref 6–20)
CALCIUM: 9.3 mg/dL (ref 8.9–10.3)
CO2: 28 mmol/L (ref 22–32)
Chloride: 103 mmol/L (ref 101–111)
Creatinine, Ser: 0.77 mg/dL (ref 0.44–1.00)
GFR calc Af Amer: >60 mL/min (ref >60)
GFR calc non Af Amer: >60 mL/min (ref >60)
GLUCOSE: 178 mg/dL — AB (ref 65–99)
Potassium: 3.6 mmol/L (ref 3.5–5.1)
Sodium: 139 mmol/L (ref 135–145)
TOTAL PROTEIN: 7.1 g/dL (ref 6.5–8.1)

## 2015-06-11 LAB — CBC WITH DIFFERENTIAL/PLATELET
Basophils Absolute: 0 10*3/uL (ref 0.0–0.1)
Basophils Relative: 1 %
Eosinophils Absolute: 0.2 10*3/uL (ref 0.0–0.7)
Eosinophils Relative: 4 %
HEMATOCRIT: 39.3 % (ref 36.0–46.0)
HEMOGLOBIN: 12.6 g/dL (ref 12.0–15.0)
LYMPHS ABS: 1.7 10*3/uL (ref 0.7–4.0)
LYMPHS PCT: 46 %
MCH: 26.6 pg (ref 26.0–34.0)
MCHC: 32.1 g/dL (ref 30.0–36.0)
MCV: 82.9 fL (ref 78.0–100.0)
MONOS PCT: 11 %
Monocytes Absolute: 0.4 10*3/uL (ref 0.1–1.0)
NEUTROS ABS: 1.3 10*3/uL — AB (ref 1.7–7.7)
NEUTROS PCT: 38 %
Platelets: 230 10*3/uL (ref 150–400)
RBC: 4.74 MIL/uL (ref 3.87–5.11)
RDW: 14 % (ref 11.5–15.5)
WBC: 3.6 10*3/uL — ABNORMAL LOW (ref 4.0–10.5)

## 2015-06-11 NOTE — Progress Notes (Signed)
Melanie Kilts, MD [place] [id number]   DIAGNOSIS:  MGUS Thyroid nodules  CURRENT THERAPY: Surveillance    INTERVAL HISTORY: Melanie Welch 69 y.o. 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 Last SPEP/IEP in 08/2014 showed two monoclonal proteins.  She feels great and is doing well.  Her family history is significant for a sister with multiple myeloma.  Melanie Welch is here alone today.  She follows up on her goiter in one year. She had it biopsied and it was benign.  She states that she feels good today. "energy level is good," but she doesn't sleep well at night. So she's a bit sleepy during the day and nods off a bit.  She states that she works out in [place] two days a week, and walks every other morning. And she states that her weight hasn't changed either.  She likes to sleep until 9 or 9:30; she states that she stays up until about 1 or 1:15. She has tried melatonin and takes it when she really can't sleep. She says she cuts TV off about an hour before bed and reads, and tries not to pick up anything electronic.  She indicates her upper left jaw, and says "sometimes my bone hurts," also indicating her gums. She states she's trying to find a new dentist.  She will have bloodwork done today. She states that she is good on her colonoscopy and her next time is 2020. She has also had her flu shot.   Past Medical History  Diagnosis Date  . Diabetes mellitus   . Hypertension   . Proteinuria   . MGUS (monoclonal gammopathy of unknown significance)     has MGUS (monoclonal gammopathy of unknown significance); Palpable thyroid; Nontoxic multinodular goiter; Diabetes mellitus without complication ([place]); and Essential hypertension, benign on her problem list.     has No  Known Allergies.  Melanie Welch had no medications administered during this visit.  Past Surgical History  Procedure Laterality Date  . Abdominal hysterectomy    . Breast biopsy  2005    left  . Colonoscopy  07/13/2011    Procedure: COLONOSCOPY;  Surgeon: Melanie Peng, MD;  Location[facility];  Service: Endoscopy;  Laterality: N/A;  9:30 AM   FAMILY HISTORY There is no one else in the family with thyroid issues. Sister, deceased, had multiple myeloma.  Review of Systems Denies any headaches, dizziness, double vision, fevers, chills, night sweats, nausea, vomiting, diarrhea, constipation, chest pain, heart palpitations, shortness of breath, blood in stool, black tarry stool, urinary pain, urinary burning, urinary frequency, hematuria. Denies fever or rash. Difficulty with sleep Positive for allergies. Positive for malaise/fatigue.  14 point review of systems was performed and is negative except as detailed under history of present illness and above    PHYSICAL EXAMINATION  ECOG PERFORMANCE STATUS: 0 - Asymptomatic  Filed Vitals:   06/11/15 1121  BP: 146/86  Pulse: 76  Temp: 98.4 F (36.9 C)  Resp: 18    GENERAL:alert, no distress, well nourished, well developed, comfortable, cooperative and smiling SKIN: skin color, texture, turgor are normal, no rashes or significant lesions HEAD: Normocephalic, No masses, lesions, tenderness or abnormalities EYES: normal, PERRLA, EOMI, Conjunctiva are pink and non-injected EARS: External ears normal OROPHARYNX:lips, buccal mucosa, and tongue normal  and mucous membranes are moist  NECK: supple, no adenopathy, thyromegaly noted bilaterally LYMPH:  no palpable lymphadenopathy BREAST:not examined LUNGS: clear to auscultation and percussion HEART: regular rate & rhythm, no murmurs, no gallops, S1 normal and S2 normal ABDOMEN:abdomen soft, non-tender and normal bowel sounds BACK: Back symmetric, no curvature. EXTREMITIES:less then 2  second capillary refill, no joint deformities, effusion, or inflammation, no skin discoloration, no clubbing, no cyanosis  NEURO: alert & oriented x 3 with fluent speech, no focal motor/sensory deficits, gait normal   LABORATORY DATA: I have reviewed the data as listed.  CBC    Component Value Date/Time   WBC 3.6* 06/11/2015 1226   RBC 4.74 06/11/2015 1226   RBC 4.69 12/09/2014 1311   HGB 12.6 06/11/2015 1226   HCT 39.3 06/11/2015 1226   PLT 230 06/11/2015 1226   MCV 82.9 06/11/2015 1226   MCH 26.6 06/11/2015 1226   MCHC 32.1 06/11/2015 1226   RDW 14.0 06/11/2015 1226   LYMPHSABS 1.7 06/11/2015 1226   MONOABS 0.4 06/11/2015 1226   EOSABS 0.2 06/11/2015 1226   BASOSABS 0.0 06/11/2015 1226      Chemistry      Component Value Date/Time   NA 139 06/11/2015 1226   K 3.6 06/11/2015 1226   CL 103 06/11/2015 1226   CO2 28 06/11/2015 1226   BUN 12 06/11/2015 1226   CREATININE 0.77 06/11/2015 1226   CREATININE 0.99 06/30/2010 0945      Component Value Date/Time   CALCIUM 9.3 06/11/2015 1226   ALKPHOS 38 06/11/2015 1226   AST 17 06/11/2015 1226   ALT 19 06/11/2015 1226   BILITOT 0.5 06/11/2015 1226     RADIOLOGY:  CLINICAL DATA: Neck thickening  EXAM: THYROID ULTRASOUND  TECHNIQUE: Ultrasound examination of the thyroid gland and adjacent soft tissues was performed.  COMPARISON: 09/03/2011  FINDINGS: Right thyroid lobe  Measurements: 4.8 x 2.0 x 2.5 cm. 4.0 x 1.7 x 2.2 cm heterogeneous mass in the upper pole.  Left thyroid lobe  Measurements: 7.2 x 3.3 x 4.4 cm. 5.8 x 3.0 x 3.9 cm mass extends poor is the thoracic inlet. It occupies most of the left lobe.  Isthmus  Thickness: 4 mm. No nodules visualized.  Lymphadenopathy  None visualized.  IMPRESSION: Bilateral large thyroid masses as described. Findings meet consensus criteria for biopsy. Ultrasound-guided fine needle aspiration should be considered, as per the consensus statement:  Management of Thyroid Nodules Detected at [place]: Society of Radiologists in [place]. Radiology 2005; [id number].   Electronically Signed  By: Melanie Welch M.D.  On: 10/04/2014 16:09  PATHOLOGY     ASSESSMENT AND PLAN:  MGUS Family history of myeloma Thyroid nodules, biopsied and benign  I advised her I would call her if any of her laboratory studies come back and are significantly changed or of concern.  We will continue to monitor labs.  She had a myeloma survey earlier this year which was WNL. We will see her back in 6 months. If her labs continue to be stable we can consider yearly observation in the future.    We have given her the name of several local dentists.   I will see her back in 6 months.  All questions were answered. The patient knows to call the clinic with any problems, questions or concerns. We can certainly see the patient much sooner if necessary.  This document serves as a record of services personally performed by Ancil Linsey, MD. It was created on  her behalf by Toni Amend, a trained medical scribe. The creation of this record is based on the scribe's personal observations and the provider's statements to them. This document has been checked and approved by the attending provider.  I have reviewed the above documentation for accuracy and completeness, and I agree with the above. This note was electronically signed.  Ancil Linsey, MD

## 2015-06-11 NOTE — Patient Instructions (Signed)
..  [place] at [facility] Discharge Instructions  RECOMMENDATIONS MADE BY THE CONSULTANT AND ANY TEST RESULTS WILL BE SENT TO YOUR REFERRING PHYSICIAN.  Labs today Return in 6 months   Thank you for choosing [place] at [facility] to provide your oncology and hematology care.  To afford each patient quality time with our provider, please arrive at least 15 minutes before your scheduled appointment time.    You need to re-schedule your appointment should you arrive 10 or more minutes late.  We strive to give you quality time with our providers, and arriving late affects you and other patients whose appointments are after yours.  Also, if you no show three or more times for appointments you may be dismissed from the clinic at the providers discretion.     Again, thank you for choosing [facility].  Our hope is that these requests will decrease the amount of time that you wait before being seen by our physicians.       _____________________________________________________________  Should you have questions after your visit to [facility], please contact our office at [phone] between the hours of 8:30 a.m. and 4:30 p.m.  Voicemails left after 4:30 p.m. will not be returned until the following business day.  For prescription refill requests, have your pharmacy contact our office.

## 2015-06-12 LAB — KAPPA/LAMBDA LIGHT CHAINS
KAPPA FREE LGHT CHN: 39.06 mg/L — AB (ref 3.30–19.40)
Kappa, lambda light chain ratio: 1.94 — ABNORMAL HIGH (ref 0.26–1.65)
Lambda free light chains: 20.1 mg/L (ref 5.71–26.30)

## 2015-06-12 LAB — PROTEIN ELECTROPHORESIS, SERUM
A/G Ratio: 1 (ref 0.7–1.7)
Albumin ELP: 3.4 g/dL (ref 2.9–4.4)
Alpha-1-Globulin: 0.2 g/dL (ref 0.0–0.4)
Alpha-2-Globulin: 0.6 g/dL (ref 0.4–1.0)
BETA GLOBULIN: 1.1 g/dL (ref 0.7–1.3)
GLOBULIN, TOTAL: 3.4 g/dL (ref 2.2–3.9)
Gamma Globulin: 1.5 g/dL (ref 0.4–1.8)
M-Spike, %: 0.2 g/dL — ABNORMAL HIGH
Total Protein ELP: 6.8 g/dL (ref 6.0–8.5)

## 2015-06-12 LAB — IMMUNOFIXATION ELECTROPHORESIS
IGM, SERUM: 178 mg/dL (ref 26–217)
IgA: 235 mg/dL (ref 87–352)
IgG (Immunoglobin G), Serum: 1305 mg/dL (ref 700–1600)
TOTAL PROTEIN ELP: 6.7 g/dL (ref 6.0–8.5)

## 2015-06-12 LAB — IGG, IGA, IGM
IgA: 239 mg/dL (ref 87–352)
IgG (Immunoglobin G), Serum: 1346 mg/dL (ref 700–1600)
IgM, Serum: 177 mg/dL (ref 26–217)

## 2015-08-15 DIAGNOSIS — E663 Overweight: Secondary | ICD-10-CM | POA: Diagnosis not present

## 2015-08-15 DIAGNOSIS — E782 Mixed hyperlipidemia: Secondary | ICD-10-CM | POA: Diagnosis not present

## 2015-08-15 DIAGNOSIS — E1165 Type 2 diabetes mellitus with hyperglycemia: Secondary | ICD-10-CM | POA: Diagnosis not present

## 2015-08-15 DIAGNOSIS — Z6827 Body mass index (BMI) 27.0-27.9, adult: Secondary | ICD-10-CM | POA: Diagnosis not present

## 2015-08-15 DIAGNOSIS — I10 Essential (primary) hypertension: Secondary | ICD-10-CM | POA: Diagnosis not present

## 2015-08-15 DIAGNOSIS — Z1389 Encounter for screening for other disorder: Secondary | ICD-10-CM | POA: Diagnosis not present

## 2015-08-28 ENCOUNTER — Ambulatory Visit (HOSPITAL_COMMUNITY): Payer: Commercial Managed Care - HMO | Admitting: Hematology & Oncology

## 2015-08-28 ENCOUNTER — Other Ambulatory Visit (HOSPITAL_COMMUNITY): Payer: Commercial Managed Care - HMO

## 2015-09-09 DIAGNOSIS — E663 Overweight: Secondary | ICD-10-CM | POA: Diagnosis not present

## 2015-09-09 DIAGNOSIS — Z6827 Body mass index (BMI) 27.0-27.9, adult: Secondary | ICD-10-CM | POA: Diagnosis not present

## 2015-09-09 DIAGNOSIS — I10 Essential (primary) hypertension: Secondary | ICD-10-CM | POA: Diagnosis not present

## 2015-09-09 DIAGNOSIS — Z1389 Encounter for screening for other disorder: Secondary | ICD-10-CM | POA: Diagnosis not present

## 2015-09-09 DIAGNOSIS — Z0000 Encounter for general adult medical examination without abnormal findings: Secondary | ICD-10-CM | POA: Diagnosis not present

## 2015-10-07 DIAGNOSIS — H5200 Hypermetropia, unspecified eye: Secondary | ICD-10-CM | POA: Diagnosis not present

## 2015-10-07 DIAGNOSIS — H5210 Myopia, unspecified eye: Secondary | ICD-10-CM | POA: Diagnosis not present

## 2015-10-07 DIAGNOSIS — E109 Type 1 diabetes mellitus without complications: Secondary | ICD-10-CM | POA: Diagnosis not present

## 2015-10-07 DIAGNOSIS — E1165 Type 2 diabetes mellitus with hyperglycemia: Secondary | ICD-10-CM | POA: Diagnosis not present

## 2015-10-07 DIAGNOSIS — I10 Essential (primary) hypertension: Secondary | ICD-10-CM | POA: Diagnosis not present

## 2015-11-27 DIAGNOSIS — E663 Overweight: Secondary | ICD-10-CM | POA: Diagnosis not present

## 2015-11-27 DIAGNOSIS — R5383 Other fatigue: Secondary | ICD-10-CM | POA: Diagnosis not present

## 2015-11-27 DIAGNOSIS — E1165 Type 2 diabetes mellitus with hyperglycemia: Secondary | ICD-10-CM | POA: Diagnosis not present

## 2015-11-27 DIAGNOSIS — Z1389 Encounter for screening for other disorder: Secondary | ICD-10-CM | POA: Diagnosis not present

## 2015-11-27 DIAGNOSIS — E049 Nontoxic goiter, unspecified: Secondary | ICD-10-CM | POA: Diagnosis not present

## 2015-11-27 DIAGNOSIS — E538 Deficiency of other specified B group vitamins: Secondary | ICD-10-CM | POA: Diagnosis not present

## 2015-11-27 DIAGNOSIS — E782 Mixed hyperlipidemia: Secondary | ICD-10-CM | POA: Diagnosis not present

## 2015-11-27 DIAGNOSIS — Z6827 Body mass index (BMI) 27.0-27.9, adult: Secondary | ICD-10-CM | POA: Diagnosis not present

## 2015-11-27 DIAGNOSIS — I10 Essential (primary) hypertension: Secondary | ICD-10-CM | POA: Diagnosis not present

## 2015-11-27 DIAGNOSIS — E559 Vitamin D deficiency, unspecified: Secondary | ICD-10-CM | POA: Diagnosis not present

## 2015-12-09 ENCOUNTER — Ambulatory Visit (HOSPITAL_COMMUNITY): Payer: Commercial Managed Care - HMO | Admitting: Hematology & Oncology

## 2015-12-09 ENCOUNTER — Other Ambulatory Visit (HOSPITAL_COMMUNITY): Payer: Commercial Managed Care - HMO

## 2015-12-10 ENCOUNTER — Encounter (HOSPITAL_COMMUNITY): Payer: Commercial Managed Care - HMO | Attending: Hematology & Oncology | Admitting: Hematology & Oncology

## 2015-12-10 ENCOUNTER — Encounter (HOSPITAL_COMMUNITY): Payer: Self-pay | Admitting: Hematology & Oncology

## 2015-12-10 ENCOUNTER — Encounter (HOSPITAL_COMMUNITY): Payer: Commercial Managed Care - HMO

## 2015-12-10 VITALS — BP 133/81 | HR 73 | Temp 98.2°F | Resp 18 | Wt 176.0 lb

## 2015-12-10 DIAGNOSIS — Z9889 Other specified postprocedural states: Secondary | ICD-10-CM | POA: Insufficient documentation

## 2015-12-10 DIAGNOSIS — I10 Essential (primary) hypertension: Secondary | ICD-10-CM | POA: Diagnosis not present

## 2015-12-10 DIAGNOSIS — D472 Monoclonal gammopathy: Secondary | ICD-10-CM | POA: Diagnosis not present

## 2015-12-10 DIAGNOSIS — Z808 Family history of malignant neoplasm of other organs or systems: Secondary | ICD-10-CM

## 2015-12-10 DIAGNOSIS — E042 Nontoxic multinodular goiter: Secondary | ICD-10-CM | POA: Diagnosis not present

## 2015-12-10 DIAGNOSIS — E119 Type 2 diabetes mellitus without complications: Secondary | ICD-10-CM | POA: Diagnosis not present

## 2015-12-10 DIAGNOSIS — Z780 Asymptomatic menopausal state: Secondary | ICD-10-CM

## 2015-12-10 LAB — CBC WITH DIFFERENTIAL/PLATELET
BASOS ABS: 0 10*3/uL (ref 0.0–0.1)
Basophils Relative: 1 %
EOS ABS: 0.2 10*3/uL (ref 0.0–0.7)
Eosinophils Relative: 4 %
HCT: 41 % (ref 36.0–46.0)
Hemoglobin: 13.1 g/dL (ref 12.0–15.0)
LYMPHS ABS: 2.2 10*3/uL (ref 0.7–4.0)
LYMPHS PCT: 44 %
MCH: 26.7 pg (ref 26.0–34.0)
MCHC: 32 g/dL (ref 30.0–36.0)
MCV: 83.7 fL (ref 78.0–100.0)
Monocytes Absolute: 0.4 10*3/uL (ref 0.1–1.0)
Monocytes Relative: 9 %
NEUTROS PCT: 42 %
Neutro Abs: 2 10*3/uL (ref 1.7–7.7)
PLATELETS: 217 10*3/uL (ref 150–400)
RBC: 4.9 MIL/uL (ref 3.87–5.11)
RDW: 14.1 % (ref 11.5–15.5)
WBC: 4.9 10*3/uL (ref 4.0–10.5)

## 2015-12-10 LAB — COMPREHENSIVE METABOLIC PANEL
ALT: 35 U/L (ref 14–54)
AST: 22 U/L (ref 15–41)
Albumin: 4 g/dL (ref 3.5–5.0)
Alkaline Phosphatase: 47 U/L (ref 38–126)
Anion gap: 9 (ref 5–15)
BUN: 12 mg/dL (ref 6–20)
CALCIUM: 9.2 mg/dL (ref 8.9–10.3)
CHLORIDE: 101 mmol/L (ref 101–111)
CO2: 27 mmol/L (ref 22–32)
CREATININE: 1.02 mg/dL — AB (ref 0.44–1.00)
GFR calc Af Amer: >60 mL/min (ref >60)
GFR calc non Af Amer: 55 mL/min — ABNORMAL LOW (ref >60)
GLUCOSE: 227 mg/dL — AB (ref 65–99)
Potassium: 3.5 mmol/L (ref 3.5–5.1)
SODIUM: 137 mmol/L (ref 135–145)
Total Bilirubin: 0.4 mg/dL (ref 0.3–1.2)
Total Protein: 7.7 g/dL (ref 6.5–8.1)

## 2015-12-10 NOTE — Progress Notes (Signed)
Melanie Kilts, MD [place] [address]   DIAGNOSIS:  MGUS Thyroid nodules  CURRENT THERAPY: Surveillance    INTERVAL HISTORY: Melanie Welch 70 y.o. 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 Last SPEP/IEP in 08/2014 showed two monoclonal proteins.  She feels great and is doing well.  Her family history is significant for a sister with multiple myeloma.  [name] is here alone today.  She said that she has been "doing nothing". 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   She has no energy. She notes this is more chronic. She is going to start walking around the mall for exercise. She is also planning on a [place] trip to [place].   She notes otherwise she is doing well. No significant change in appetite. No new pain.   Past Medical History  Diagnosis Date  . Diabetes mellitus   . Hypertension   . Proteinuria   . MGUS (monoclonal gammopathy of unknown significance)     has MGUS (monoclonal gammopathy of unknown significance); Palpable thyroid; Nontoxic multinodular goiter; Diabetes mellitus without complication ([facility]); and Essential hypertension, benign on her problem list.     has No Known Allergies.  [name] had no medications administered during this visit.  Past Surgical History  Procedure Laterality Date  . Abdominal hysterectomy    . Breast biopsy  2005    left  . Colonoscopy  07/13/2011    Procedure: COLONOSCOPY;  Surgeon: Dorothyann Peng, MD;  Location: [facility];  Service: Endoscopy;  Laterality: N/A;  9:30 AM   FAMILY HISTORY There is no one else in the family with thyroid issues. Sister, deceased, had multiple myeloma.  Review of Systems Denies any headaches, dizziness, double vision, fevers, chills, night sweats,  nausea, vomiting, diarrhea, constipation, chest pain, heart palpitations, shortness of breath, blood in stool, black tarry stool, urinary pain, urinary burning, urinary frequency, hematuria. Denies fever or rash. Difficulty with sleep Positive for allergies. Positive for malaise/fatigue. Has no energy.  Positive for abdominal pain When she eats certain things.  14 point review of systems was performed and is negative except as detailed under history of present illness and above    PHYSICAL EXAMINATION  ECOG PERFORMANCE STATUS: 0 - Asymptomatic  Filed Vitals:   12/10/15 1359  BP: 133/81  Pulse: 73  Temp: 98.2 F (36.8 C)  Resp: 18    GENERAL:alert, no distress, well nourished, well developed, comfortable, cooperative and smiling SKIN: skin color, texture, turgor are normal, no rashes or significant lesions HEAD: Normocephalic, No masses, lesions, tenderness or abnormalities EYES: normal, PERRLA, EOMI, Conjunctiva are pink and non-injected EARS: External ears normal OROPHARYNX:lips, buccal mucosa, and tongue normal and mucous membranes are moist  NECK: supple, no adenopathy, Goiter LYMPH:  no palpable lymphadenopathy BREAST:not examined LUNGS: clear to auscultation and percussion HEART: regular rate & rhythm, no murmurs, no gallops, S1 normal and S2 normal ABDOMEN:abdomen soft, non-tender and normal bowel sounds BACK: Back symmetric, no curvature. EXTREMITIES:less then 2 second capillary refill, no joint deformities, effusion, or inflammation, no skin discoloration, no clubbing, no cyanosis  NEURO: alert & oriented x 3 with fluent speech, no focal motor/sensory deficits, gait normal   LABORATORY DATA: I have reviewed the data as listed.  CBC  Component Value Date/Time   WBC 4.9 12/10/2015 1340   RBC 4.90 12/10/2015 1340   RBC 4.69 12/09/2014 1311   HGB 13.1 12/10/2015 1340   HCT 41.0 12/10/2015 1340   PLT 217 12/10/2015 1340   MCV 83.7 12/10/2015 1340   MCH 26.7  12/10/2015 1340   MCHC 32.0 12/10/2015 1340   RDW 14.1 12/10/2015 1340   LYMPHSABS 2.2 12/10/2015 1340   MONOABS 0.4 12/10/2015 1340   EOSABS 0.2 12/10/2015 1340   BASOSABS 0.0 12/10/2015 1340      Chemistry      Component Value Date/Time   NA 137 12/10/2015 1340   K 3.5 12/10/2015 1340   CL 101 12/10/2015 1340   CO2 27 12/10/2015 1340   BUN 12 12/10/2015 1340   CREATININE 1.02* 12/10/2015 1340   CREATININE 0.99 06/30/2010 0945      Component Value Date/Time   CALCIUM 9.2 12/10/2015 1340   ALKPHOS 47 12/10/2015 1340   AST 22 12/10/2015 1340   ALT 35 12/10/2015 1340   BILITOT 0.4 12/10/2015 1340     RADIOLOGY: I have personally reviewed the radiological images as listed and agreed with the findings in the report.  CLINICAL DATA: Neck thickening  EXAM: THYROID ULTRASOUND  TECHNIQUE: Ultrasound examination of the thyroid gland and adjacent soft tissues was performed.  COMPARISON: 09/03/2011  FINDINGS: Right thyroid lobe  Measurements: 4.8 x 2.0 x 2.5 cm. 4.0 x 1.7 x 2.2 cm heterogeneous mass in the upper pole.  Left thyroid lobe  Measurements: 7.2 x 3.3 x 4.4 cm. 5.8 x 3.0 x 3.9 cm mass extends poor is the thoracic inlet. It occupies most of the left lobe.  Isthmus  Thickness: 4 mm. No nodules visualized.  Lymphadenopathy  None visualized.  IMPRESSION: Bilateral large thyroid masses as described. Findings meet consensus criteria for biopsy. Ultrasound-guided fine needle aspiration should be considered, as per the consensus statement: Management of Thyroid Nodules Detected at [place]: Society of Radiologists in [place]. Radiology 2005; [id number].   Electronically Signed  By: Marybelle Killings M.D.  On: 10/04/2014 16:09  PATHOLOGY     ASSESSMENT AND PLAN:  MGUS Family history of myeloma Thyroid nodules, biopsied and benign  I advised her I would call her if any of her laboratory studies come back  and are significantly changed or of concern. We can move her follow-up out to yearly. I have ordered a DEXA. I did discuss with her that patients with MGUS are at higher risk for osteoporosis. We will notify her of results and recommendations when complete. 24 hr urine for M protein will be obtained at her next return.   FROM UP TO DATE: ?No treatment is required for patients with MGUS. Patients with MGUS should be followed over time with history and physical examination looking for signs and symptoms of progressive disease. All patients should undergo laboratory evaluation for disease progression six months after diagnosis with serum and urinary M-protein, complete blood count, creatinine, and serum calcium. While some experts incorporate periodic laboratory testing into the routine follow-up of all patients, we use a risk stratification system to identify patients for whom laboratory testing is most likely to provide value: .Patients with low-risk non-IgM MGUS (serum M protein ?1.5 g/dL, IgG subtype, and normal serum free light chain ratio) may be followed with history and physical examination alone. They have a risk of progression of only 5 percent over 20 years. .All other patients are followed with annual serum and urinary M-protein, complete blood  count, creatinine, and serum calcium in addition to a history and physical examination. Patients with MGUS are at increased risk of fracture and thromboembolic disease. Patients with MGUS should be evaluated for osteoporosis with a dual energy X-ray absorptiometry (DEXA) scan and have their vitamin D and calcium intake optimized. If osteoporosis is present, then treatment with bisphosphonates may be considered at the same dosing and schedule used for osteoporosis in patients without MGUS  All questions were answered. The patient knows to call the clinic with any problems, questions or concerns. We can certainly see the patient much sooner if  necessary.  This document serves as a record of services personally performed by Ancil Linsey, MD. It was created on her behalf by Kandace Blitz, a trained medical scribe. The creation of this record is based on the scribe's personal observations and the provider's statements to them. This document has been checked and approved by the attending provider.  I have reviewed the above documentation for accuracy and completeness, and I agree with the above. This note was electronically signed.  Ancil Linsey, MD

## 2015-12-10 NOTE — Patient Instructions (Signed)
[place] at [facility] Discharge Instructions  RECOMMENDATIONS MADE BY THE CONSULTANT AND ANY TEST RESULTS WILL BE SENT TO YOUR REFERRING PHYSICIAN.  Exam and discussion today with Dr. Whitney Muse. Dexa scan as scheduled. Return for lab work and office visit in one year. Call the clinic should you have any questions or concerns prior to your next visit.   Thank you for choosing [place] at [facility] to provide your oncology and hematology care.  To afford each patient quality time with our provider, please arrive at least 15 minutes before your scheduled appointment time.   Beginning January 23rd 2017 lab work for the [facility] will be done in the  Main lab at [facility] on 1st floor. If you have a lab appointment with the [facility] please come in thru the  Main Entrance and check in at the main information desk  You need to re-schedule your appointment should you arrive 10 or more minutes late.  We strive to give you quality time with our providers, and arriving late affects you and other patients whose appointments are after yours.  Also, if you no show three or more times for appointments you may be dismissed from the clinic at the providers discretion.     Again, thank you for choosing [facility].  Our hope is that these requests will decrease the amount of time that you wait before being seen by our physicians.       _____________________________________________________________  Should you have questions after your visit to [facility], please contact our office at [phone] between the hours of 8:30 a.m. and 4:30 p.m.  Voicemails left after 4:30 p.m. will not be returned until the following business day.  For prescription refill requests, have your pharmacy contact our office.         Resources For Cancer Patients and their Caregivers ? American Cancer Society: Can assist with transportation,  wigs, general needs, runs Look Good Feel Better.        818-078-1550 ? Cancer Care: Provides financial assistance, online support groups, medication/co-pay assistance.  1-800-813-HOPE 713-846-1360) ? [facility] Assists [place] cancer patients and their families through emotional , educational and financial support.  403-487-8177 ? [place] DSS Where to apply for food stamps, Medicaid and utility assistance. 8105267943 ? [employer]: Transportation to medical appointments. 8622911913 ? Social Security Administration: May apply for disability if have a Stage IV cancer. 952 137 5966 206 382 4944 ? [employer], Disability and Transit Services: Assists with nutrition, care and transit needs. [place] Support Programs: @10RELATIVEDAYS @ > Cancer Support Group  2nd Tuesday of the month 1pm-2pm, Journey Room  > Creative Journey  3rd Tuesday of the month 1130am-1pm, Journey Room  > Look Good Feel Better  1st Wednesday of the month 10am-12 noon, Journey Room (Call [facility] to register [phone])

## 2015-12-11 LAB — IGG, IGA, IGM
IgA: 248 mg/dL (ref 87–352)
IgG (Immunoglobin G), Serum: 1409 mg/dL (ref 700–1600)
IgM, Serum: 183 mg/dL (ref 26–217)

## 2015-12-12 LAB — PROTEIN ELECTROPHORESIS, SERUM
A/G Ratio: 1 (ref 0.7–1.7)
ALBUMIN ELP: 3.6 g/dL (ref 2.9–4.4)
ALPHA-1-GLOBULIN: 0.2 g/dL (ref 0.0–0.4)
Alpha-2-Globulin: 0.7 g/dL (ref 0.4–1.0)
Beta Globulin: 1.1 g/dL (ref 0.7–1.3)
GLOBULIN, TOTAL: 3.7 g/dL (ref 2.2–3.9)
Gamma Globulin: 1.6 g/dL (ref 0.4–1.8)
M-Spike, %: 0.4 g/dL — ABNORMAL HIGH
TOTAL PROTEIN ELP: 7.3 g/dL (ref 6.0–8.5)

## 2015-12-12 LAB — KAPPA/LAMBDA LIGHT CHAINS
KAPPA FREE LGHT CHN: 45.95 mg/L — AB (ref 3.30–19.40)
KAPPA, LAMDA LIGHT CHAIN RATIO: 2.01 — AB (ref 0.26–1.65)
Kappa free light chain: 45.48 mg/L — ABNORMAL HIGH (ref 3.30–19.40)
Kappa, lambda light chain ratio: 1.67 — ABNORMAL HIGH (ref 0.26–1.65)
LAMDA FREE LIGHT CHAINS: 22.81 mg/L (ref 5.71–26.30)
Lambda free light chains: 27.16 mg/L — ABNORMAL HIGH (ref 5.71–26.30)

## 2015-12-12 LAB — IMMUNOFIXATION ELECTROPHORESIS
IgA: 246 mg/dL (ref 87–352)
IgG (Immunoglobin G), Serum: 1372 mg/dL (ref 700–1600)
IgM, Serum: 180 mg/dL (ref 26–217)
Total Protein ELP: 7.2 g/dL (ref 6.0–8.5)

## 2015-12-18 ENCOUNTER — Encounter (HOSPITAL_COMMUNITY): Payer: Self-pay | Admitting: *Deleted

## 2015-12-18 ENCOUNTER — Ambulatory Visit (HOSPITAL_COMMUNITY)
Admission: RE | Admit: 2015-12-18 | Discharge: 2015-12-18 | Disposition: A | Discharge status: Home / Self Care | Payer: Commercial Managed Care - HMO | Source: Ambulatory Visit | Attending: Hematology & Oncology | Admitting: Hematology & Oncology

## 2015-12-18 DIAGNOSIS — Z780 Asymptomatic menopausal state: Secondary | ICD-10-CM | POA: Diagnosis not present

## 2015-12-18 DIAGNOSIS — D472 Monoclonal gammopathy: Secondary | ICD-10-CM | POA: Insufficient documentation

## 2015-12-18 DIAGNOSIS — Z13820 Encounter for screening for osteoporosis: Secondary | ICD-10-CM | POA: Diagnosis not present

## 2016-02-05 ENCOUNTER — Telehealth (HOSPITAL_COMMUNITY): Payer: Self-pay | Admitting: Emergency Medicine

## 2016-02-05 NOTE — Telephone Encounter (Signed)
Pt called to see why she had a missed called from this number

## 2016-02-20 DIAGNOSIS — E1165 Type 2 diabetes mellitus with hyperglycemia: Secondary | ICD-10-CM | POA: Diagnosis not present

## 2016-02-20 DIAGNOSIS — Z6826 Body mass index (BMI) 26.0-26.9, adult: Secondary | ICD-10-CM | POA: Diagnosis not present

## 2016-02-20 DIAGNOSIS — E782 Mixed hyperlipidemia: Secondary | ICD-10-CM | POA: Diagnosis not present

## 2016-02-20 DIAGNOSIS — I10 Essential (primary) hypertension: Secondary | ICD-10-CM | POA: Diagnosis not present

## 2016-03-01 DIAGNOSIS — E1165 Type 2 diabetes mellitus with hyperglycemia: Secondary | ICD-10-CM | POA: Diagnosis not present

## 2016-03-01 DIAGNOSIS — Z6826 Body mass index (BMI) 26.0-26.9, adult: Secondary | ICD-10-CM | POA: Diagnosis not present

## 2016-03-01 DIAGNOSIS — E663 Overweight: Secondary | ICD-10-CM | POA: Diagnosis not present

## 2016-03-01 DIAGNOSIS — Z1389 Encounter for screening for other disorder: Secondary | ICD-10-CM | POA: Diagnosis not present

## 2016-05-11 DIAGNOSIS — Z1389 Encounter for screening for other disorder: Secondary | ICD-10-CM | POA: Diagnosis not present

## 2016-05-11 DIAGNOSIS — E559 Vitamin D deficiency, unspecified: Secondary | ICD-10-CM | POA: Diagnosis not present

## 2016-05-11 DIAGNOSIS — R5383 Other fatigue: Secondary | ICD-10-CM | POA: Diagnosis not present

## 2016-05-11 DIAGNOSIS — Z23 Encounter for immunization: Secondary | ICD-10-CM | POA: Diagnosis not present

## 2016-05-11 DIAGNOSIS — Z6826 Body mass index (BMI) 26.0-26.9, adult: Secondary | ICD-10-CM | POA: Diagnosis not present

## 2016-05-26 ENCOUNTER — Ambulatory Visit: Payer: Commercial Managed Care - HMO | Admitting: "Endocrinology

## 2016-08-30 DIAGNOSIS — J00 Acute nasopharyngitis [common cold]: Secondary | ICD-10-CM | POA: Diagnosis not present

## 2016-08-30 DIAGNOSIS — R6889 Other general symptoms and signs: Secondary | ICD-10-CM | POA: Diagnosis not present

## 2016-09-15 DIAGNOSIS — Z0001 Encounter for general adult medical examination with abnormal findings: Secondary | ICD-10-CM | POA: Diagnosis not present

## 2016-09-15 DIAGNOSIS — E1165 Type 2 diabetes mellitus with hyperglycemia: Secondary | ICD-10-CM | POA: Diagnosis not present

## 2016-09-15 DIAGNOSIS — Z1389 Encounter for screening for other disorder: Secondary | ICD-10-CM | POA: Diagnosis not present

## 2016-09-15 DIAGNOSIS — Z681 Body mass index (BMI) 19 or less, adult: Secondary | ICD-10-CM | POA: Diagnosis not present

## 2016-12-16 ENCOUNTER — Other Ambulatory Visit (HOSPITAL_COMMUNITY): Payer: Commercial Managed Care - HMO

## 2016-12-16 ENCOUNTER — Ambulatory Visit (HOSPITAL_COMMUNITY): Payer: Commercial Managed Care - HMO | Admitting: Adult Health

## 2016-12-31 DIAGNOSIS — E663 Overweight: Secondary | ICD-10-CM | POA: Diagnosis not present

## 2016-12-31 DIAGNOSIS — Z6825 Body mass index (BMI) 25.0-25.9, adult: Secondary | ICD-10-CM | POA: Diagnosis not present

## 2016-12-31 DIAGNOSIS — E538 Deficiency of other specified B group vitamins: Secondary | ICD-10-CM | POA: Diagnosis not present

## 2016-12-31 DIAGNOSIS — E1165 Type 2 diabetes mellitus with hyperglycemia: Secondary | ICD-10-CM | POA: Diagnosis not present

## 2016-12-31 DIAGNOSIS — E559 Vitamin D deficiency, unspecified: Secondary | ICD-10-CM | POA: Diagnosis not present

## 2016-12-31 DIAGNOSIS — Z1389 Encounter for screening for other disorder: Secondary | ICD-10-CM | POA: Diagnosis not present

## 2016-12-31 DIAGNOSIS — E782 Mixed hyperlipidemia: Secondary | ICD-10-CM | POA: Diagnosis not present

## 2016-12-31 DIAGNOSIS — I10 Essential (primary) hypertension: Secondary | ICD-10-CM | POA: Diagnosis not present

## 2016-12-31 DIAGNOSIS — E049 Nontoxic goiter, unspecified: Secondary | ICD-10-CM | POA: Diagnosis not present

## 2017-02-28 DIAGNOSIS — Z1231 Encounter for screening mammogram for malignant neoplasm of breast: Secondary | ICD-10-CM | POA: Diagnosis not present

## 2017-03-09 DIAGNOSIS — R928 Other abnormal and inconclusive findings on diagnostic imaging of breast: Secondary | ICD-10-CM | POA: Diagnosis not present

## 2017-03-09 DIAGNOSIS — R921 Mammographic calcification found on diagnostic imaging of breast: Secondary | ICD-10-CM | POA: Diagnosis not present

## 2017-04-11 DIAGNOSIS — E663 Overweight: Secondary | ICD-10-CM | POA: Diagnosis not present

## 2017-04-11 DIAGNOSIS — E782 Mixed hyperlipidemia: Secondary | ICD-10-CM | POA: Diagnosis not present

## 2017-04-11 DIAGNOSIS — I10 Essential (primary) hypertension: Secondary | ICD-10-CM | POA: Diagnosis not present

## 2017-04-11 DIAGNOSIS — Z23 Encounter for immunization: Secondary | ICD-10-CM | POA: Diagnosis not present

## 2017-04-11 DIAGNOSIS — E784 Other hyperlipidemia: Secondary | ICD-10-CM | POA: Diagnosis not present

## 2017-04-11 DIAGNOSIS — Z6825 Body mass index (BMI) 25.0-25.9, adult: Secondary | ICD-10-CM | POA: Diagnosis not present

## 2017-04-11 DIAGNOSIS — Z1389 Encounter for screening for other disorder: Secondary | ICD-10-CM | POA: Diagnosis not present

## 2017-04-11 DIAGNOSIS — E119 Type 2 diabetes mellitus without complications: Secondary | ICD-10-CM | POA: Diagnosis not present

## 2017-04-26 DIAGNOSIS — H5203 Hypermetropia, bilateral: Secondary | ICD-10-CM | POA: Diagnosis not present

## 2017-04-26 DIAGNOSIS — H25013 Cortical age-related cataract, bilateral: Secondary | ICD-10-CM | POA: Diagnosis not present

## 2017-04-26 DIAGNOSIS — H52203 Unspecified astigmatism, bilateral: Secondary | ICD-10-CM | POA: Diagnosis not present

## 2017-04-26 DIAGNOSIS — H2513 Age-related nuclear cataract, bilateral: Secondary | ICD-10-CM | POA: Diagnosis not present

## 2017-04-26 DIAGNOSIS — E1165 Type 2 diabetes mellitus with hyperglycemia: Secondary | ICD-10-CM | POA: Diagnosis not present

## 2017-04-26 DIAGNOSIS — E119 Type 2 diabetes mellitus without complications: Secondary | ICD-10-CM | POA: Diagnosis not present

## 2017-04-26 DIAGNOSIS — H524 Presbyopia: Secondary | ICD-10-CM | POA: Diagnosis not present

## 2017-04-26 IMAGING — DX DG BONE SURVEY MET
9 of 10 series · 9 of 10 positions shown · non-contrast
Comparison: Bone survey dated August 25, 2007

CLINICAL DATA: Mobile focal gammopathy, no complaints

EXAM:
METASTATIC BONE SURVEY

[chest pa]
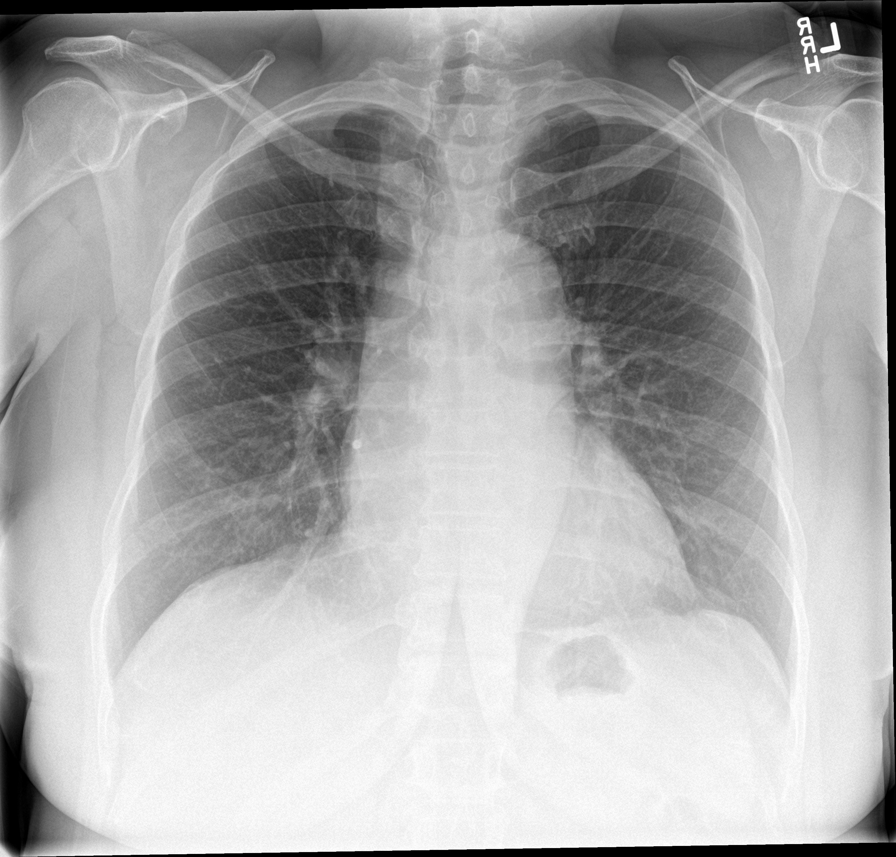

[c-spine lat]
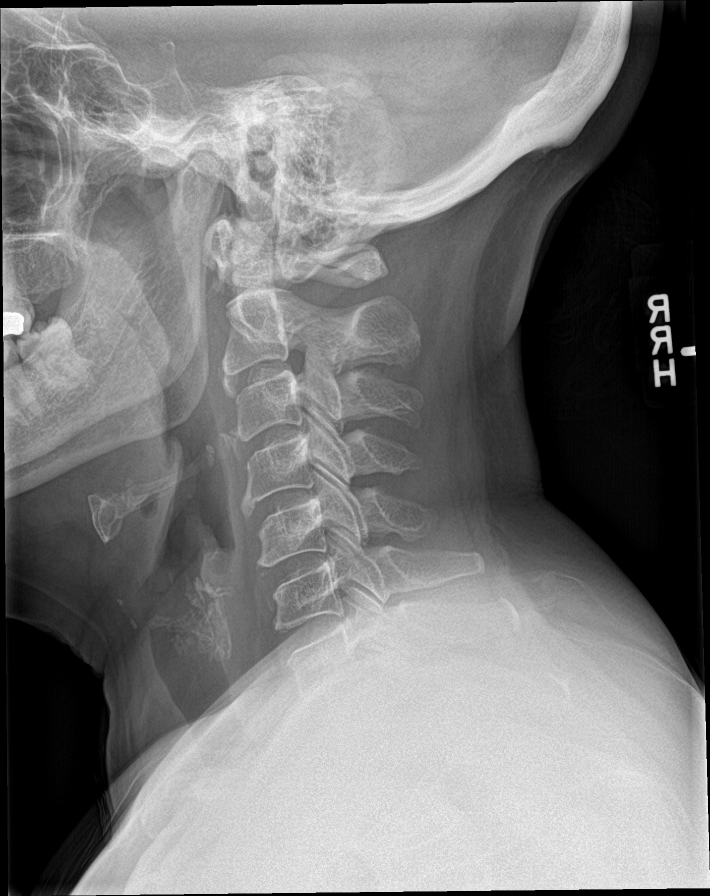

[skull lat]
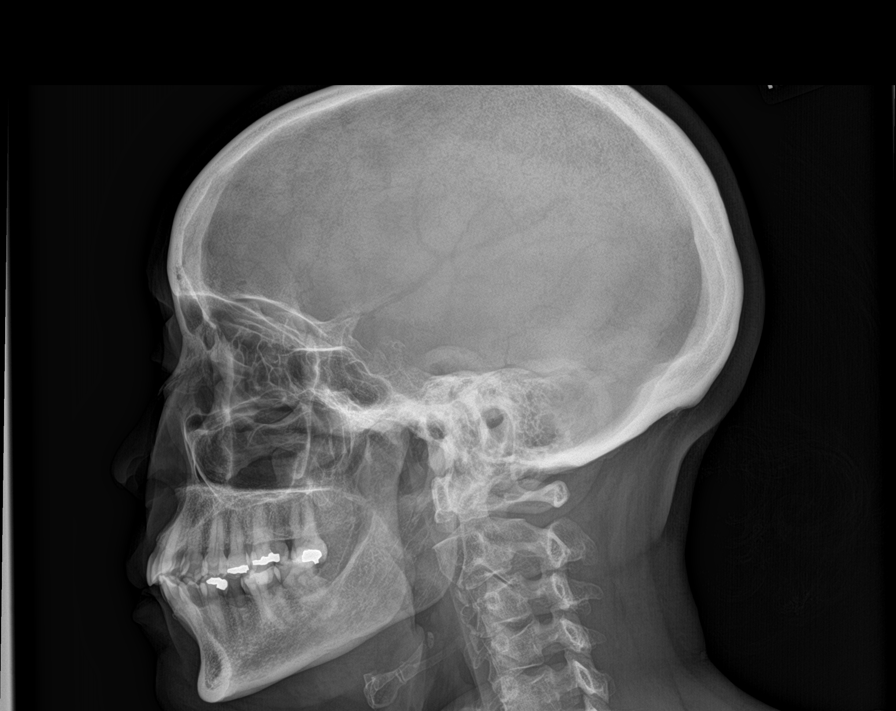

[shoulder ap (1 of 2)]
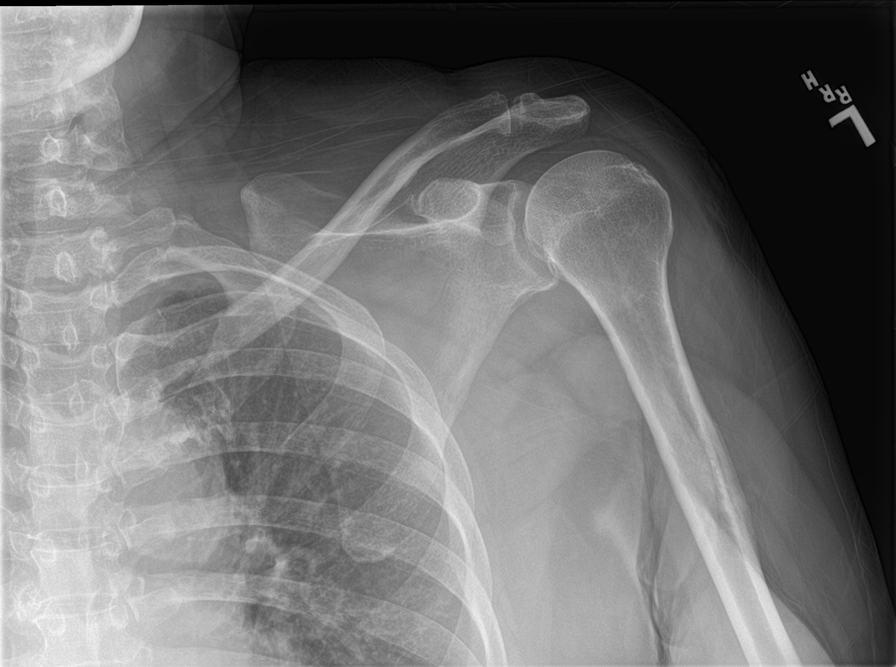

[shoulder ap (2 of 2)]
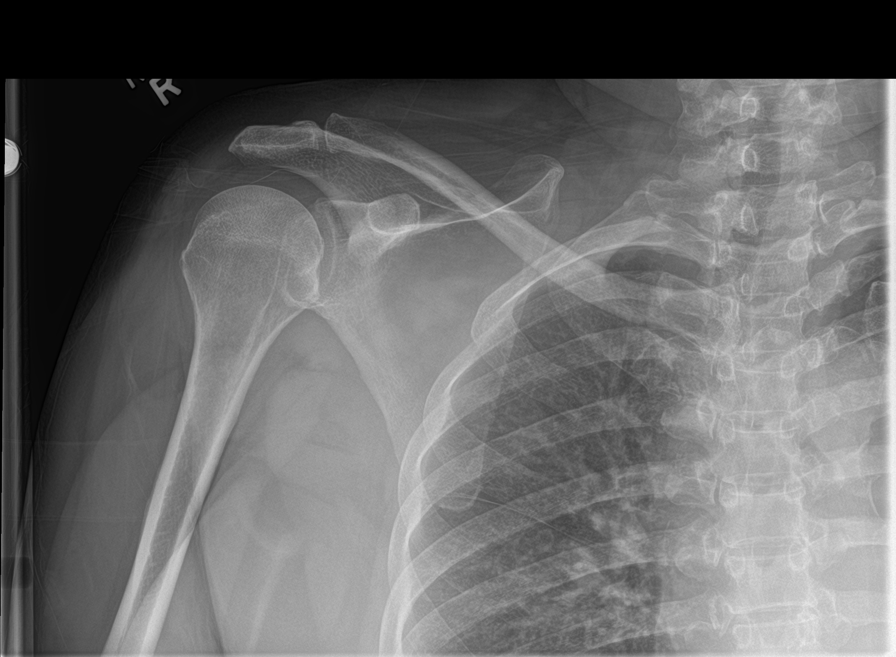

[humerus ap (1 of 2)]
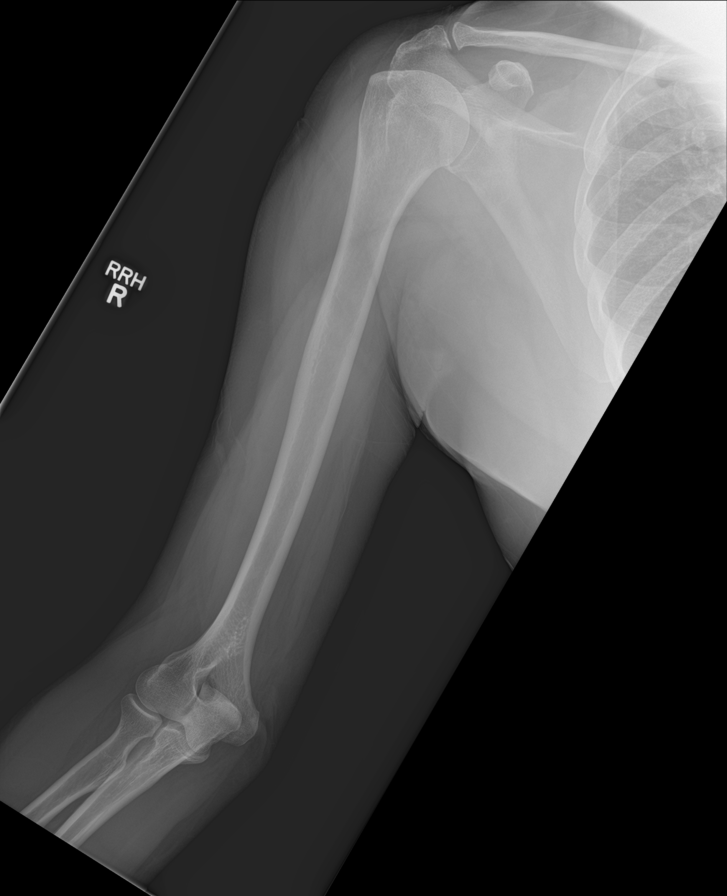

[humerus ap (2 of 2)]
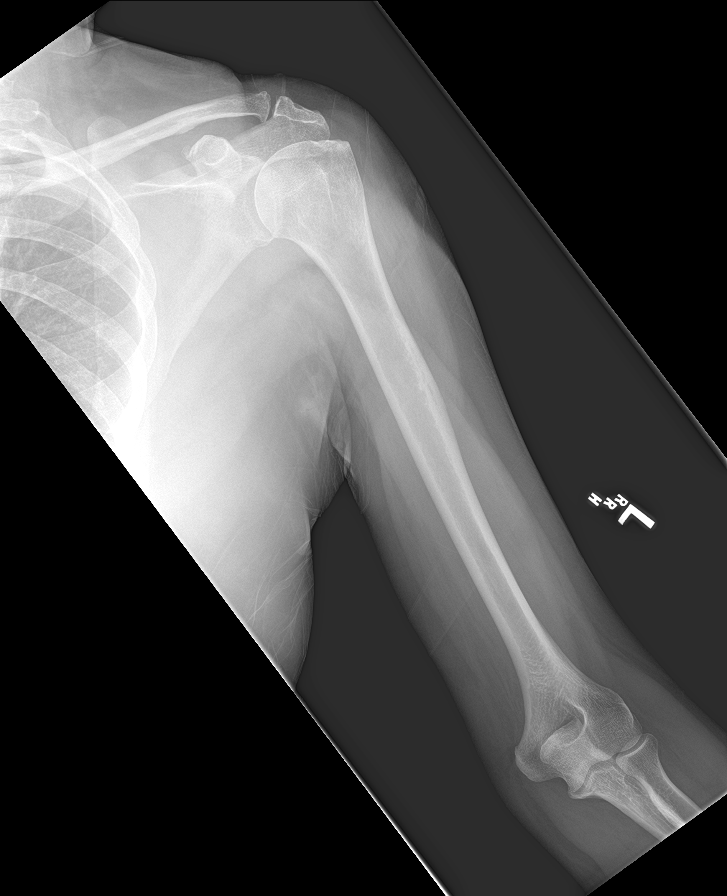

[c-spine ap]
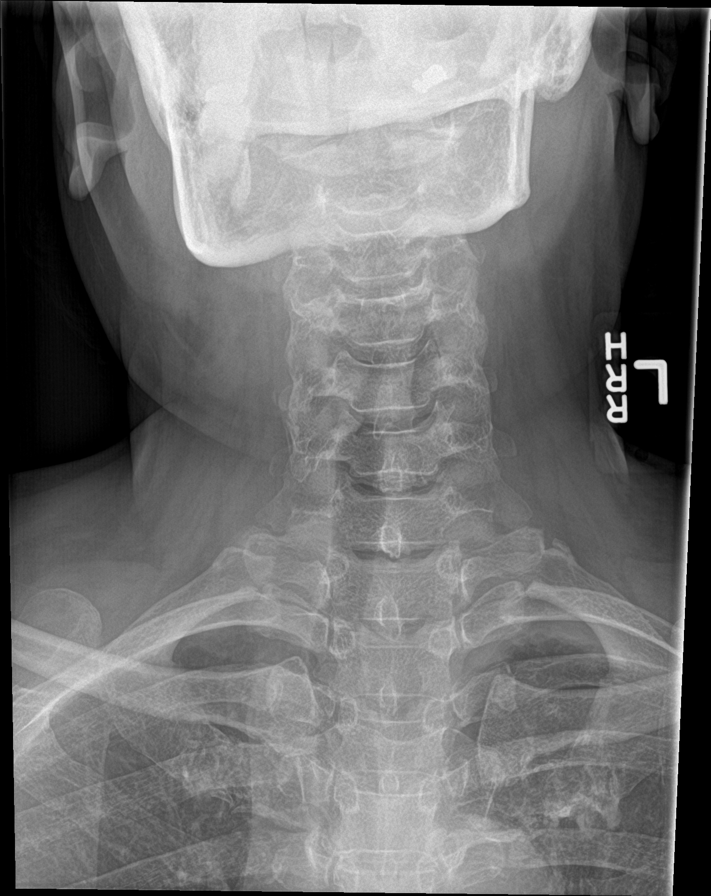

[t-spine ap]
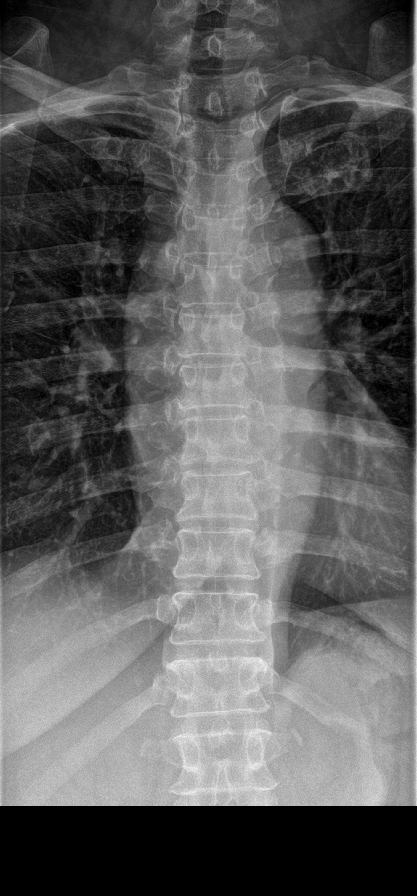

[9 of 10 positions shown; findings below may reference images not displayed]

FINDINGS: Chest x-ray: The lungs are well-expanded and clear. The heart and
mediastinal structures are unremarkable. There is mild deviation of
the trachea toward the right which is more conspicuous than in the
past. The visualized portions of the ribs exhibit no lytic or
blastic lesions.

Calvarium and spine: No lytic or blastic lesions are demonstrated.
There are small endplate osteophytes in the cervical spine.

Pectoral girdle:  No lytic or blastic lesions are demonstrated.

Pelvic girdle: The bony pelvis and lower extremities exhibit no
lytic or blastic lesions.
IMPRESSION: Normal metastatic bone survey. There are no findings to suggest
osseous metastatic disease.

## 2017-05-02 NOTE — Patient Instructions (Signed)
Your procedure is scheduled on: 05/10/2017  Report to [facility] at  11   AM.  Call this number if you have problems the morning of surgery: [phone]   Do not eat food or drink liquids :After Midnight.      Take these medicines the morning of surgery with A SIP OF WATER: norvasc, losartan.   Do not wear jewelry, make-up or nail polish.  Do not wear lotions, powders, or perfumes. You may wear deodorant.  Do not shave 48 hours prior to surgery.  Do not bring valuables to the hospital.  Contacts, dentures or bridgework may not be worn into surgery.  Leave suitcase in the car. After surgery it may be brought to your room.  For patients admitted to the hospital, checkout time is 11:00 AM the day of discharge.   Patients discharged the day of surgery will not be allowed to drive home.  :     Please read over the following fact sheets that you were given: Coughing and Deep Breathing, Surgical Site Infection Prevention, Anesthesia Post-op Instructions and Care and Recovery After Surgery    Cataract A cataract is a clouding of the lens of the eye. When a lens becomes cloudy, vision is reduced based on the degree and nature of the clouding. Many cataracts reduce vision to some degree. Some cataracts make people more near-sighted as they develop. Other cataracts increase glare. Cataracts that are ignored and become worse can sometimes look white. The white color can be seen through the pupil. CAUSES   Aging. However, cataracts may occur at any age, even in newborns.   Certain drugs.   Trauma to the eye.   Certain diseases such as diabetes.   Specific eye diseases such as chronic inflammation inside the eye or a sudden attack of a rare form of glaucoma.   Inherited or acquired medical problems.  SYMPTOMS   Gradual, progressive drop in vision in the affected eye.   Severe, rapid visual loss. This most often happens when trauma is the cause.  DIAGNOSIS  To detect a cataract, an eye  doctor examines the lens. Cataracts are best diagnosed with an exam of the eyes with the pupils enlarged (dilated) by drops.  TREATMENT  For an early cataract, vision may improve by using different eyeglasses or stronger lighting. If that does not help your vision, surgery is the only effective treatment. A cataract needs to be surgically removed when vision loss interferes with your everyday activities, such as driving, reading, or watching TV. A cataract may also have to be removed if it prevents examination or treatment of another eye problem. Surgery removes the cloudy lens and usually replaces it with a substitute lens (intraocular lens, IOL).  At a time when both you and your doctor agree, the cataract will be surgically removed. If you have cataracts in both eyes, only one is usually removed at a time. This allows the operated eye to heal and be out of danger from any possible problems after surgery (such as infection or poor wound healing). In rare cases, a cataract may be doing damage to your eye. In these cases, your caregiver may advise surgical removal right away. The vast majority of people who have cataract surgery have better vision afterward. HOME CARE INSTRUCTIONS  If you are not planning surgery, you may be asked to do the following:  Use different eyeglasses.   Use stronger or brighter lighting.   Ask your eye doctor about reducing your medicine  dose or changing medicines if it is thought that a medicine caused your cataract. Changing medicines does not make the cataract go away on its own.   Become familiar with your surroundings. Poor vision can lead to injury. Avoid bumping into things on the affected side. You are at a higher risk for tripping or falling.   Exercise extreme care when driving or operating machinery.   Wear sunglasses if you are sensitive to bright light or experiencing problems with glare.  SEEK IMMEDIATE MEDICAL CARE IF:   You have a worsening or sudden  vision loss.   You notice redness, swelling, or increasing pain in the eye.   You have a fever.  Document Released: 07/19/2005 Document Revised: 07/08/2011 Document Reviewed: 03/12/2011 [facility] Patient Information 2012 [place].PATIENT INSTRUCTIONS POST-ANESTHESIA  IMMEDIATELY FOLLOWING SURGERY:  Do not drive or operate machinery for the first twenty four hours after surgery.  Do not make any important decisions for twenty four hours after surgery or while taking narcotic pain medications or sedatives.  If you develop intractable nausea and vomiting or a severe headache please notify your doctor immediately.  FOLLOW-UP:  Please make an appointment with your surgeon as instructed. You do not need to follow up with anesthesia unless specifically instructed to do so.  WOUND CARE INSTRUCTIONS (if applicable):  Keep a dry clean dressing on the anesthesia/puncture wound site if there is drainage.  Once the wound has quit draining you may leave it open to air.  Generally you should leave the bandage intact for twenty four hours unless there is drainage.  If the epidural site drains for more than 36-48 hours please call the anesthesia department.  QUESTIONS?:  Please feel free to call your physician or the hospital operator if you have any questions, and they will be happy to assist you.

## 2017-05-04 ENCOUNTER — Encounter (HOSPITAL_COMMUNITY): Payer: Self-pay

## 2017-05-04 ENCOUNTER — Encounter (HOSPITAL_COMMUNITY)
Admission: RE | Admit: 2017-05-04 | Discharge: 2017-05-04 | Disposition: A | Discharge status: Home / Self Care | Payer: Medicare HMO | Source: Ambulatory Visit | Attending: Ophthalmology | Admitting: Ophthalmology

## 2017-05-09 ENCOUNTER — Encounter (HOSPITAL_COMMUNITY)
Admission: RE | Admit: 2017-05-09 | Discharge: 2017-05-09 | Disposition: A | Discharge status: Home / Self Care | Payer: Medicare HMO | Source: Ambulatory Visit | Attending: Ophthalmology | Admitting: Ophthalmology

## 2017-05-09 ENCOUNTER — Encounter (HOSPITAL_COMMUNITY): Payer: Self-pay

## 2017-05-09 DIAGNOSIS — Z7984 Long term (current) use of oral hypoglycemic drugs: Secondary | ICD-10-CM | POA: Diagnosis not present

## 2017-05-09 DIAGNOSIS — I10 Essential (primary) hypertension: Secondary | ICD-10-CM | POA: Diagnosis not present

## 2017-05-09 DIAGNOSIS — H268 Other specified cataract: Secondary | ICD-10-CM | POA: Diagnosis not present

## 2017-05-09 DIAGNOSIS — Z79899 Other long term (current) drug therapy: Secondary | ICD-10-CM | POA: Diagnosis not present

## 2017-05-09 DIAGNOSIS — Z87891 Personal history of nicotine dependence: Secondary | ICD-10-CM | POA: Diagnosis not present

## 2017-05-09 DIAGNOSIS — E1136 Type 2 diabetes mellitus with diabetic cataract: Secondary | ICD-10-CM | POA: Diagnosis not present

## 2017-05-09 LAB — CBC
HCT: 36.6 % (ref 36.0–46.0)
HEMOGLOBIN: 11.8 g/dL — AB (ref 12.0–15.0)
MCH: 27.1 pg (ref 26.0–34.0)
MCHC: 32.2 g/dL (ref 30.0–36.0)
MCV: 83.9 fL (ref 78.0–100.0)
PLATELETS: 218 10*3/uL (ref 150–400)
RBC: 4.36 MIL/uL (ref 3.87–5.11)
RDW: 13.8 % (ref 11.5–15.5)
WBC: 4.9 10*3/uL (ref 4.0–10.5)

## 2017-05-09 LAB — BASIC METABOLIC PANEL
Anion gap: 9 (ref 5–15)
BUN: 14 mg/dL (ref 6–20)
CALCIUM: 9.2 mg/dL (ref 8.9–10.3)
CHLORIDE: 104 mmol/L (ref 101–111)
CO2: 27 mmol/L (ref 22–32)
CREATININE: 0.98 mg/dL (ref 0.44–1.00)
GFR, EST NON AFRICAN AMERICAN: 57 mL/min — AB (ref >60)
Glucose, Bld: 178 mg/dL — ABNORMAL HIGH (ref 65–99)
Potassium: 3.5 mmol/L (ref 3.5–5.1)
SODIUM: 140 mmol/L (ref 135–145)

## 2017-05-10 ENCOUNTER — Ambulatory Visit (HOSPITAL_COMMUNITY)
Admission: RE | Admit: 2017-05-10 | Discharge: 2017-05-10 | Disposition: A | Discharge status: Home / Self Care | Payer: Medicare HMO | Source: Ambulatory Visit | Attending: Ophthalmology | Admitting: Ophthalmology

## 2017-05-10 ENCOUNTER — Ambulatory Visit (HOSPITAL_COMMUNITY): Payer: Medicare HMO | Admitting: Anesthesiology

## 2017-05-10 ENCOUNTER — Encounter (HOSPITAL_COMMUNITY): Payer: Self-pay | Admitting: *Deleted

## 2017-05-10 ENCOUNTER — Encounter (HOSPITAL_COMMUNITY)
Admission: RE | Disposition: A | Discharge status: Home / Self Care | Payer: Self-pay | Source: Ambulatory Visit | Attending: Ophthalmology

## 2017-05-10 DIAGNOSIS — Z87891 Personal history of nicotine dependence: Secondary | ICD-10-CM | POA: Diagnosis not present

## 2017-05-10 DIAGNOSIS — H268 Other specified cataract: Secondary | ICD-10-CM | POA: Insufficient documentation

## 2017-05-10 DIAGNOSIS — Z79899 Other long term (current) drug therapy: Secondary | ICD-10-CM | POA: Insufficient documentation

## 2017-05-10 DIAGNOSIS — H5703 Miosis: Secondary | ICD-10-CM | POA: Diagnosis not present

## 2017-05-10 DIAGNOSIS — I10 Essential (primary) hypertension: Secondary | ICD-10-CM | POA: Insufficient documentation

## 2017-05-10 DIAGNOSIS — Z7984 Long term (current) use of oral hypoglycemic drugs: Secondary | ICD-10-CM | POA: Insufficient documentation

## 2017-05-10 DIAGNOSIS — H25011 Cortical age-related cataract, right eye: Secondary | ICD-10-CM | POA: Diagnosis not present

## 2017-05-10 DIAGNOSIS — H2512 Age-related nuclear cataract, left eye: Secondary | ICD-10-CM | POA: Diagnosis not present

## 2017-05-10 DIAGNOSIS — E1136 Type 2 diabetes mellitus with diabetic cataract: Secondary | ICD-10-CM | POA: Insufficient documentation

## 2017-05-10 DIAGNOSIS — H2511 Age-related nuclear cataract, right eye: Secondary | ICD-10-CM | POA: Diagnosis not present

## 2017-05-10 DIAGNOSIS — H25012 Cortical age-related cataract, left eye: Secondary | ICD-10-CM | POA: Diagnosis not present

## 2017-05-10 HISTORY — PX: CATARACT EXTRACTION W/PHACO: SHX586

## 2017-05-10 LAB — GLUCOSE, CAPILLARY: GLUCOSE-CAPILLARY: 132 mg/dL — AB (ref 65–99)

## 2017-05-10 SURGERY — PHACOEMULSIFICATION, CATARACT, WITH IOL INSERTION
Anesthesia: Monitor Anesthesia Care | Site: Eye | Laterality: Right

## 2017-05-10 MED ORDER — KETOROLAC TROMETHAMINE 0.5 % OP SOLN
1.0000 [drp] | OPHTHALMIC | Status: AC
  Administered 2017-05-10 (×3): 1 [drp] via OPHTHALMIC

## 2017-05-10 MED ORDER — FENTANYL CITRATE (PF) 100 MCG/2ML IJ SOLN
25.0000 ug | Freq: Once | INTRAMUSCULAR | Status: AC
  Administered 2017-05-10: 25 ug via INTRAVENOUS

## 2017-05-10 MED ORDER — FENTANYL CITRATE (PF) 100 MCG/2ML IJ SOLN
INTRAMUSCULAR | Status: AC
  Filled 2017-05-10: qty 2

## 2017-05-10 MED ORDER — MIDAZOLAM HCL 2 MG/2ML IJ SOLN
1.0000 mg | INTRAMUSCULAR | Status: AC
  Administered 2017-05-10: 2 mg via INTRAVENOUS

## 2017-05-10 MED ORDER — PHENYLEPHRINE HCL 2.5 % OP SOLN
1.0000 [drp] | OPHTHALMIC | Status: AC
  Administered 2017-05-10 (×3): 1 [drp] via OPHTHALMIC

## 2017-05-10 MED ORDER — MIDAZOLAM HCL 2 MG/2ML IJ SOLN
INTRAMUSCULAR | Status: AC
  Filled 2017-05-10: qty 2

## 2017-05-10 MED ORDER — TETRACAINE HCL 0.5 % OP SOLN
1.0000 [drp] | OPHTHALMIC | Status: AC
  Administered 2017-05-10 (×3): 1 [drp] via OPHTHALMIC

## 2017-05-10 MED ORDER — LACTATED RINGERS IV SOLN
INTRAVENOUS | Status: DC
  Administered 2017-05-10: 09:00:00 via INTRAVENOUS

## 2017-05-10 MED ORDER — EPINEPHRINE PF 1 MG/ML IJ SOLN
INTRAOCULAR | Status: DC | PRN
  Administered 2017-05-10: 500 mL

## 2017-05-10 MED ORDER — TETRACAINE 0.5 % OP SOLN OPTIME - NO CHARGE
OPHTHALMIC | Status: DC | PRN
  Administered 2017-05-10: 2 [drp] via OPHTHALMIC

## 2017-05-10 MED ORDER — CYCLOPENTOLATE-PHENYLEPHRINE 0.2-1 % OP SOLN
1.0000 [drp] | OPHTHALMIC | Status: AC
  Administered 2017-05-10 (×3): 1 [drp] via OPHTHALMIC

## 2017-05-10 MED ORDER — BSS IO SOLN
INTRAOCULAR | Status: DC | PRN
  Administered 2017-05-10: 15 mL

## 2017-05-10 MED ORDER — LIDOCAINE HCL (PF) 1 % IJ SOLN
INTRAMUSCULAR | Status: DC | PRN
  Administered 2017-05-10: .6 mL

## 2017-05-10 MED ORDER — PROVISC 10 MG/ML IO SOLN
INTRAOCULAR | Status: DC | PRN
  Administered 2017-05-10: 0.85 mL via INTRAOCULAR

## 2017-05-10 SURGICAL SUPPLY — 10 items
CLOTH BEACON ORANGE TIMEOUT ST (SAFETY) ×2 IMPLANT
EYE SHIELD UNIVERSAL CLEAR (GAUZE/BANDAGES/DRESSINGS) ×2 IMPLANT
GLOVE BIOGEL PI IND STRL 6.5 (GLOVE) ×2 IMPLANT
GLOVE BIOGEL PI INDICATOR 6.5 (GLOVE) ×2
LENS ALC ACRYL/TECN (Ophthalmic Related) ×2 IMPLANT
PAD ARMBOARD 7.5X6 YLW CONV (MISCELLANEOUS) ×2 IMPLANT
RING MALYGIN (MISCELLANEOUS) ×2 IMPLANT
TAPE SURG TRANSPORE 1 IN (GAUZE/BANDAGES/DRESSINGS) ×1 IMPLANT
TAPE SURGICAL TRANSPORE 1 IN (GAUZE/BANDAGES/DRESSINGS) ×1
WATER STERILE IRR 250ML POUR (IV SOLUTION) ×2 IMPLANT

## 2017-05-10 NOTE — Transfer of Care (Signed)
Immediate Anesthesia Transfer of Care Note  Patient: [place]  Procedure(s) Performed: CATARACT EXTRACTION PHACO AND INTRAOCULAR LENS PLACEMENT (IOC) (Right Eye)  Patient Location: Short Stay  Anesthesia Type:MAC  Level of Consciousness: awake  Airway & Oxygen Therapy: Patient Spontanous Breathing  Post-op Assessment: Report given to RN  Post vital signs: Reviewed  Last Vitals:  Vitals:   05/10/17 0823 05/10/17 0904  BP: (!) 153/86   Pulse: 73   Resp: 18 19  Temp: 36.9 C   SpO2: 95% 98%    Last Pain:  Vitals:   05/10/17 0823  TempSrc: Oral      Patients Stated Pain Goal: 5 (23/55/73 2202)  Complications: No apparent anesthesia complications

## 2017-05-10 NOTE — H&P (Signed)
The patient was re examined and there is no change in the patients condition since the original H and P. 

## 2017-05-10 NOTE — Op Note (Signed)
Patient brought to the operating room and prepped and draped in the usual manner.  Lid speculum inserted in right eye.  Stab incision made at the twelve o'clock position.  Intraocular Xylocaine instilled.Provisc instilled in the anterior chamber.   A 2.4 mm. Stab incision was made temporally.  Due to a small pupil, a Maluagyn Ring was inserted.  An anterior capsulotomy was done with a bent 25 gauge needle.  The nucleus was hydrodissected.  The Phaco tip was inserted in the anterior chamber and the nucleus was emulsified.  CDE was 5.04.  The cortical material was then removed with the I and A tip.  Posterior capsule was the polished.  The anterior chamber was deepened with Provisc.  A 22.5 Diopter Alcon [id number] IOL was then inserted in the capsular bag. The Malugyn Ring was removed. Provisc was then removed with the I and A tip.  The wound was then hydrated.  Patient sent to the Recovery Room in good condition with follow up in my office.  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

## 2017-05-10 NOTE — Discharge Instructions (Signed)
°  °          [facility] Instructions °[address]- [place] [address] °    ° °1. Avoid closing eyes tightly. One often closes the eye tightly when laughing, talking, sneezing, coughing or if they feel irritated. At these times, you should be careful not to close your eyes tightly. ° °2. Instill eye drops as instructed. To instill drops in your eye, open it, look up and have someone gently pull the lower lid down and instill a couple of drops inside the lower lid. ° °3. Do not touch upper lid. ° °4. Take Advil or Tylenol for pain. ° °5. You may use either eye for near work, such as reading or sewing and you may watch television. ° °6. You may have your hair done at the beauty parlor at any time. ° °7. Wear dark glasses with or without your own glasses if you are in bright light. ° °8. Call our office at [phone] or [phone] if you have sharp pain in your eye or unusual symptoms. ° °9.  FOLLOW UP WITH DR. SHAPIRO TODAY IN HIS [place] OFFICE AT 2:45pm. ° °  °I have received a copy of the above instructions and will follow them.  ° ° ° °IF YOU ARE IN IMMEDIATE DANGER CALL 911! ° °It is important for you to keep your follow-up appointment with your physician after discharge, OR, for you /your caregiver to make a follow-up appointment with your physician / medical provider after discharge. ° °Show these instructions to the next healthcare provider you see. ° °

## 2017-05-10 NOTE — Anesthesia Postprocedure Evaluation (Signed)
Anesthesia Post Note  Patient: [facility]  Procedure(s) Performed: CATARACT EXTRACTION PHACO AND INTRAOCULAR LENS PLACEMENT (IOC) (Right Eye)  Patient location during evaluation: Short Stay Anesthesia Type: MAC Level of consciousness: awake and alert and oriented Pain management: pain level controlled Vital Signs Assessment: post-procedure vital signs reviewed and stable Respiratory status: spontaneous breathing Cardiovascular status: blood pressure returned to baseline Postop Assessment: no apparent nausea or vomiting Anesthetic complications: no     Last Vitals:  Vitals:   05/10/17 0823 05/10/17 0904  BP: (!) 153/86   Pulse: 73   Resp: 18 19  Temp: 36.9 C   SpO2: 95% 98%    Last Pain:  Vitals:   05/10/17 0823  TempSrc: Oral                 Nolan Tuazon

## 2017-05-10 NOTE — Anesthesia Preprocedure Evaluation (Signed)
Anesthesia Evaluation  Patient identified by MRN, date of birth, ID band Patient awake    Reviewed: Allergy & Precautions, NPO status , Patient's Chart, lab work & pertinent test results  Airway Mallampati: II  TM Distance: >3 FB     Dental  (+) Teeth Intact   Pulmonary former smoker,    breath sounds clear to auscultation       Cardiovascular hypertension, Pt. on medications  Rhythm:Regular Rate:Normal     Neuro/Psych    GI/Hepatic negative GI ROS, Neg liver ROS,   Endo/Other  diabetesNontoxic goiter   Renal/GU      Musculoskeletal   Abdominal   Peds  Hematology  (+) Blood dyscrasia ( MGUS (monoclonal gammopathy of unknown significance)), ,   Anesthesia Other Findings   Reproductive/Obstetrics                             Anesthesia Physical Anesthesia Plan  ASA: III  Anesthesia Plan: MAC   Post-op Pain Management:    Induction: Intravenous  PONV Risk Score and Plan:   Airway Management Planned: Simple Face Mask  Additional Equipment:   Intra-op Plan:   Post-operative Plan:   Informed Consent: I have reviewed the patients History and Physical, chart, labs and discussed the procedure including the risks, benefits and alternatives for the proposed anesthesia with the patient or authorized representative who has indicated his/her understanding and acceptance.     Plan Discussed with:   Anesthesia Plan Comments:         Anesthesia Quick Evaluation

## 2017-05-11 ENCOUNTER — Encounter (HOSPITAL_COMMUNITY): Payer: Self-pay | Admitting: Ophthalmology

## 2017-05-19 ENCOUNTER — Encounter (HOSPITAL_COMMUNITY): Payer: Self-pay

## 2017-05-19 ENCOUNTER — Encounter (HOSPITAL_COMMUNITY)
Admission: RE | Admit: 2017-05-19 | Discharge: 2017-05-19 | Disposition: A | Discharge status: Home / Self Care | Payer: Medicare HMO | Source: Ambulatory Visit | Attending: Ophthalmology | Admitting: Ophthalmology

## 2017-05-24 ENCOUNTER — Encounter (HOSPITAL_COMMUNITY): Payer: Self-pay | Admitting: Anesthesiology

## 2017-05-24 ENCOUNTER — Encounter (HOSPITAL_COMMUNITY)
Admission: RE | Disposition: A | Discharge status: Home / Self Care | Payer: Self-pay | Source: Ambulatory Visit | Attending: Ophthalmology

## 2017-05-24 ENCOUNTER — Ambulatory Visit (HOSPITAL_COMMUNITY): Payer: Medicare HMO | Admitting: Anesthesiology

## 2017-05-24 ENCOUNTER — Ambulatory Visit (HOSPITAL_COMMUNITY)
Admission: RE | Admit: 2017-05-24 | Discharge: 2017-05-24 | Disposition: A | Discharge status: Home / Self Care | Payer: Medicare HMO | Source: Ambulatory Visit | Attending: Ophthalmology | Admitting: Ophthalmology

## 2017-05-24 DIAGNOSIS — E119 Type 2 diabetes mellitus without complications: Secondary | ICD-10-CM | POA: Insufficient documentation

## 2017-05-24 DIAGNOSIS — Z87891 Personal history of nicotine dependence: Secondary | ICD-10-CM | POA: Diagnosis not present

## 2017-05-24 DIAGNOSIS — I10 Essential (primary) hypertension: Secondary | ICD-10-CM | POA: Diagnosis not present

## 2017-05-24 DIAGNOSIS — H2512 Age-related nuclear cataract, left eye: Secondary | ICD-10-CM | POA: Diagnosis not present

## 2017-05-24 DIAGNOSIS — Z79899 Other long term (current) drug therapy: Secondary | ICD-10-CM | POA: Diagnosis not present

## 2017-05-24 DIAGNOSIS — H25012 Cortical age-related cataract, left eye: Secondary | ICD-10-CM | POA: Diagnosis not present

## 2017-05-24 DIAGNOSIS — Z7984 Long term (current) use of oral hypoglycemic drugs: Secondary | ICD-10-CM | POA: Diagnosis not present

## 2017-05-24 DIAGNOSIS — H5703 Miosis: Secondary | ICD-10-CM | POA: Diagnosis not present

## 2017-05-24 HISTORY — PX: CATARACT EXTRACTION W/PHACO: SHX586

## 2017-05-24 LAB — GLUCOSE, CAPILLARY: Glucose-Capillary: 101 mg/dL — ABNORMAL HIGH (ref 65–99)

## 2017-05-24 SURGERY — PHACOEMULSIFICATION, CATARACT, WITH IOL INSERTION
Anesthesia: Monitor Anesthesia Care | Site: Eye | Laterality: Left

## 2017-05-24 MED ORDER — LACTATED RINGERS IV SOLN
INTRAVENOUS | Status: DC
  Administered 2017-05-24: 07:00:00 via INTRAVENOUS

## 2017-05-24 MED ORDER — ONDANSETRON 4 MG PO TBDP
ORAL_TABLET | ORAL | Status: AC
  Filled 2017-05-24: qty 1

## 2017-05-24 MED ORDER — LIDOCAINE HCL (PF) 1 % IJ SOLN
INTRAMUSCULAR | Status: DC | PRN
  Administered 2017-05-24: .7 mL

## 2017-05-24 MED ORDER — EPINEPHRINE PF 1 MG/ML IJ SOLN
INTRAOCULAR | Status: DC | PRN
  Administered 2017-05-24: 500 mL

## 2017-05-24 MED ORDER — ONDANSETRON 4 MG PO TBDP
4.0000 mg | ORAL_TABLET | Freq: Once | ORAL | Status: AC
  Administered 2017-05-24: 4 mg via ORAL

## 2017-05-24 MED ORDER — BSS IO SOLN
INTRAOCULAR | Status: DC | PRN
  Administered 2017-05-24: 15 mL

## 2017-05-24 MED ORDER — KETOROLAC TROMETHAMINE 0.5 % OP SOLN
1.0000 [drp] | OPHTHALMIC | Status: AC
  Administered 2017-05-24 (×3): 1 [drp] via OPHTHALMIC

## 2017-05-24 MED ORDER — TETRACAINE 0.5 % OP SOLN OPTIME - NO CHARGE
OPHTHALMIC | Status: DC | PRN
  Administered 2017-05-24: 2 [drp] via OPHTHALMIC

## 2017-05-24 MED ORDER — PHENYLEPHRINE HCL 2.5 % OP SOLN
1.0000 [drp] | OPHTHALMIC | Status: AC
  Administered 2017-05-24 (×3): 1 [drp] via OPHTHALMIC

## 2017-05-24 MED ORDER — MIDAZOLAM HCL 2 MG/2ML IJ SOLN
1.0000 mg | Freq: Once | INTRAMUSCULAR | Status: AC | PRN
  Administered 2017-05-24: 2 mg via INTRAVENOUS

## 2017-05-24 MED ORDER — MIDAZOLAM HCL 2 MG/2ML IJ SOLN
INTRAMUSCULAR | Status: AC
  Filled 2017-05-24: qty 2

## 2017-05-24 MED ORDER — TETRACAINE HCL 0.5 % OP SOLN
1.0000 [drp] | OPHTHALMIC | Status: AC
  Administered 2017-05-24 (×3): 1 [drp] via OPHTHALMIC

## 2017-05-24 MED ORDER — CYCLOPENTOLATE-PHENYLEPHRINE 0.2-1 % OP SOLN
1.0000 [drp] | OPHTHALMIC | Status: AC
  Administered 2017-05-24 (×3): 1 [drp] via OPHTHALMIC

## 2017-05-24 MED ORDER — PROVISC 10 MG/ML IO SOLN
INTRAOCULAR | Status: DC | PRN
  Administered 2017-05-24: 0.85 mL via INTRAOCULAR

## 2017-05-24 SURGICAL SUPPLY — 13 items
CAPSULAR TENSION RING-AMO (OPHTHALMIC RELATED) ×2 IMPLANT
CLOTH BEACON ORANGE TIMEOUT ST (SAFETY) ×2 IMPLANT
EYE SHIELD UNIVERSAL CLEAR (GAUZE/BANDAGES/DRESSINGS) ×2 IMPLANT
GLOVE BIOGEL PI IND STRL 6.5 (GLOVE) ×1 IMPLANT
GLOVE BIOGEL PI IND STRL 7.0 (GLOVE) ×1 IMPLANT
GLOVE BIOGEL PI INDICATOR 6.5 (GLOVE) ×1
GLOVE BIOGEL PI INDICATOR 7.0 (GLOVE) ×1
LENS ALC ACRYL/TECN (Ophthalmic Related) ×2 IMPLANT
PAD ARMBOARD 7.5X6 YLW CONV (MISCELLANEOUS) ×2 IMPLANT
RING MALYGIN (MISCELLANEOUS) ×2 IMPLANT
TAPE SURG TRANSPORE 1 IN (GAUZE/BANDAGES/DRESSINGS) ×1 IMPLANT
TAPE SURGICAL TRANSPORE 1 IN (GAUZE/BANDAGES/DRESSINGS) ×1
WATER STERILE IRR 250ML POUR (IV SOLUTION) ×2 IMPLANT

## 2017-05-24 NOTE — Op Note (Signed)
Patient brought to the operating room and prepped and draped in the usual manner.  Lid speculum inserted in left eye.  Stab incision made at the twelve o'clock position. Intraocular Xylocaine instilled. Provisc instilled in the anterior chamber.   A 2.4 mm. Stab incision was made temporally. Due to a small pupil, a Malugyn Ring was inserted.  An anterior capsulotomy was done with a bent 25 gauge needle.  The nucleus was hydrodissected.  The Phaco tip was inserted in the anterior chamber and the nucleus was emulsified.  CDE was 8.40.  The cortical material was then removed with the I and A tip.  Posterior capsule was the polished.  The anterior chamber was deepened with Provisc.  A 22.0 [place] was then inserted in the capsular bag. The Malugyn Ring was removed. Provisc was then removed with the I and A tip.  The wound was then hydrated.  Patient sent to the Recovery Room in good condition with follow up in my office.  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

## 2017-05-24 NOTE — Transfer of Care (Signed)
Immediate Anesthesia Transfer of Care Note  Patient: Melanie Welch  Procedure(s) Performed: CATARACT EXTRACTION PHACO AND INTRAOCULAR LENS PLACEMENT (IOC) (Left Eye)  Patient Location: Short Stay  Anesthesia Type:MAC  Level of Consciousness: awake, alert  and oriented  Airway & Oxygen Therapy: Patient Spontanous Breathing and Patient connected to nasal cannula oxygen  Post-op Assessment: Report given to RN  Post vital signs: Reviewed and stable  Last Vitals:  Vitals:   05/24/17 0732 05/24/17 0733  BP:    Pulse:    Resp: (!) 23 20  Temp:    SpO2:  99%    Last Pain:  Vitals:   05/24/17 0720  TempSrc: Oral      Patients Stated Pain Goal: 4 (20/81/38 8719)  Complications: No apparent anesthesia complications

## 2017-05-24 NOTE — H&P (Signed)
The patient was re examined and there is no change in the patients condition since the original H and P. 

## 2017-05-24 NOTE — Anesthesia Postprocedure Evaluation (Signed)
Anesthesia Post Note  Patient: [employer]  Procedure(s) Performed: CATARACT EXTRACTION PHACO AND INTRAOCULAR LENS PLACEMENT ([place]) (Left Eye)  Patient location during evaluation: Short Stay Anesthesia Type: MAC Level of consciousness: awake and alert and oriented Pain management: pain level controlled Vital Signs Assessment: post-procedure vital signs reviewed and stable Respiratory status: spontaneous breathing Cardiovascular status: blood pressure returned to baseline Postop Assessment: no apparent nausea or vomiting Anesthetic complications: no     Last Vitals:  Vitals:   05/24/17 0732 05/24/17 0733  BP:    Pulse:    Resp: (!) 23 20  Temp:    SpO2:  99%    Last Pain:  Vitals:   05/24/17 0720  TempSrc: Oral                 Cailin Gebel

## 2017-05-24 NOTE — Anesthesia Preprocedure Evaluation (Signed)
Anesthesia Evaluation  Patient identified by MRN, date of birth, ID band  Reviewed: NPO status , Patient's Chart, lab work & pertinent test results  Airway Mallampati: I       Dental  (+) Teeth Intact   Pulmonary neg pulmonary ROS, former smoker,    Pulmonary exam normal        Cardiovascular Exercise Tolerance: Good hypertension, Pt. on medications  Rhythm:Regular Rate:Normal     Neuro/Psych    GI/Hepatic   Endo/Other  diabetes, Well Controlled, Type 2  Renal/GU proteinuria     Musculoskeletal   Abdominal Normal abdominal exam  (+)   Peds  Hematology negative hematology ROS (+) REFUSES BLOOD PRODUCTS,   Anesthesia Other Findings   Reproductive/Obstetrics                             Anesthesia Physical Anesthesia Plan  ASA: III  Anesthesia Plan: MAC   Post-op Pain Management:    Induction: Intravenous  PONV Risk Score and Plan:   Airway Management Planned: Nasal Cannula  Additional Equipment:   Intra-op Plan:   Post-operative Plan:   Informed Consent: I have reviewed the patients History and Physical, chart, labs and discussed the procedure including the risks, benefits and alternatives for the proposed anesthesia with the patient or authorized representative who has indicated his/her understanding and acceptance.   Dental advisory given  Plan Discussed with: CRNA and Surgeon  Anesthesia Plan Comments:         Anesthesia Quick Evaluation

## 2017-05-24 NOTE — Discharge Instructions (Signed)
°  °          [facility] Instructions °[address]- [place] [address]-[place] °    ° °1. Avoid closing eyes tightly. One often closes the eye tightly when laughing, talking, sneezing, coughing or if they feel irritated. At these times, you should be careful not to close your eyes tightly. ° °2. Instill eye drops as instructed. To instill drops in your eye, open it, look up and have someone gently pull the lower lid down and instill a couple of drops inside the lower lid. ° °3. Do not touch upper lid. ° °4. Take Advil or Tylenol for pain. ° °5. You may use either eye for near work, such as reading or sewing and you may watch television. ° °6. You may have your hair done at the beauty parlor at any time. ° °7. Wear dark glasses with or without your own glasses if you are in bright light. ° °8. Call our office at [phone] or [phone] if you have sharp pain in your eye or unusual symptoms. ° °9.  FOLLOW UP WITH DR. SHAPIRO TODAY IN HIS [place] OFFICE AT 2:45pm. ° °  °I have received a copy of the above instructions and will follow them.  ° ° ° °IF YOU ARE IN IMMEDIATE DANGER CALL 911! ° °It is important for you to keep your follow-up appointment with your physician after discharge, OR, for you /your caregiver to make a follow-up appointment with your physician / medical provider after discharge. ° °Show these instructions to the next healthcare provider you see. ° °

## 2017-05-25 ENCOUNTER — Encounter (HOSPITAL_COMMUNITY): Payer: Self-pay | Admitting: Ophthalmology

## 2017-08-18 DIAGNOSIS — E7849 Other hyperlipidemia: Secondary | ICD-10-CM | POA: Diagnosis not present

## 2017-08-18 DIAGNOSIS — E559 Vitamin D deficiency, unspecified: Secondary | ICD-10-CM | POA: Diagnosis not present

## 2017-08-18 DIAGNOSIS — Z1389 Encounter for screening for other disorder: Secondary | ICD-10-CM | POA: Diagnosis not present

## 2017-08-18 DIAGNOSIS — Z6826 Body mass index (BMI) 26.0-26.9, adult: Secondary | ICD-10-CM | POA: Diagnosis not present

## 2017-08-18 DIAGNOSIS — E538 Deficiency of other specified B group vitamins: Secondary | ICD-10-CM | POA: Diagnosis not present

## 2017-08-18 DIAGNOSIS — E049 Nontoxic goiter, unspecified: Secondary | ICD-10-CM | POA: Diagnosis not present

## 2017-08-18 DIAGNOSIS — E1165 Type 2 diabetes mellitus with hyperglycemia: Secondary | ICD-10-CM | POA: Diagnosis not present

## 2017-08-18 DIAGNOSIS — I10 Essential (primary) hypertension: Secondary | ICD-10-CM | POA: Diagnosis not present

## 2017-08-18 DIAGNOSIS — E663 Overweight: Secondary | ICD-10-CM | POA: Diagnosis not present

## 2017-08-18 DIAGNOSIS — E782 Mixed hyperlipidemia: Secondary | ICD-10-CM | POA: Diagnosis not present

## 2017-10-14 DIAGNOSIS — Z961 Presence of intraocular lens: Secondary | ICD-10-CM | POA: Diagnosis not present

## 2017-11-29 DIAGNOSIS — I10 Essential (primary) hypertension: Secondary | ICD-10-CM | POA: Diagnosis not present

## 2017-11-29 DIAGNOSIS — Z0001 Encounter for general adult medical examination with abnormal findings: Secondary | ICD-10-CM | POA: Diagnosis not present

## 2017-11-29 DIAGNOSIS — Z6825 Body mass index (BMI) 25.0-25.9, adult: Secondary | ICD-10-CM | POA: Diagnosis not present

## 2017-11-29 DIAGNOSIS — E7841 Elevated Lipoprotein(a): Secondary | ICD-10-CM | POA: Diagnosis not present

## 2017-11-29 DIAGNOSIS — Z1389 Encounter for screening for other disorder: Secondary | ICD-10-CM | POA: Diagnosis not present

## 2017-11-29 DIAGNOSIS — E7849 Other hyperlipidemia: Secondary | ICD-10-CM | POA: Diagnosis not present

## 2017-11-29 DIAGNOSIS — E119 Type 2 diabetes mellitus without complications: Secondary | ICD-10-CM | POA: Diagnosis not present

## 2018-01-23 DIAGNOSIS — R0981 Nasal congestion: Secondary | ICD-10-CM | POA: Diagnosis not present

## 2018-01-23 DIAGNOSIS — R05 Cough: Secondary | ICD-10-CM | POA: Diagnosis not present

## 2018-01-23 DIAGNOSIS — Z6826 Body mass index (BMI) 26.0-26.9, adult: Secondary | ICD-10-CM | POA: Diagnosis not present

## 2018-01-23 DIAGNOSIS — J209 Acute bronchitis, unspecified: Secondary | ICD-10-CM | POA: Diagnosis not present

## 2018-04-24 DIAGNOSIS — Z23 Encounter for immunization: Secondary | ICD-10-CM | POA: Diagnosis not present

## 2018-04-24 DIAGNOSIS — E7849 Other hyperlipidemia: Secondary | ICD-10-CM | POA: Diagnosis not present

## 2018-04-24 DIAGNOSIS — I10 Essential (primary) hypertension: Secondary | ICD-10-CM | POA: Diagnosis not present

## 2018-04-24 DIAGNOSIS — Z6825 Body mass index (BMI) 25.0-25.9, adult: Secondary | ICD-10-CM | POA: Diagnosis not present

## 2018-04-24 DIAGNOSIS — E119 Type 2 diabetes mellitus without complications: Secondary | ICD-10-CM | POA: Diagnosis not present

## 2018-04-24 DIAGNOSIS — E049 Nontoxic goiter, unspecified: Secondary | ICD-10-CM | POA: Diagnosis not present

## 2018-04-24 DIAGNOSIS — E538 Deficiency of other specified B group vitamins: Secondary | ICD-10-CM | POA: Diagnosis not present

## 2018-04-24 DIAGNOSIS — R5383 Other fatigue: Secondary | ICD-10-CM | POA: Diagnosis not present

## 2018-04-24 DIAGNOSIS — E782 Mixed hyperlipidemia: Secondary | ICD-10-CM | POA: Diagnosis not present

## 2018-04-24 DIAGNOSIS — E663 Overweight: Secondary | ICD-10-CM | POA: Diagnosis not present

## 2018-04-24 DIAGNOSIS — E559 Vitamin D deficiency, unspecified: Secondary | ICD-10-CM | POA: Diagnosis not present

## 2018-07-19 DIAGNOSIS — Z6826 Body mass index (BMI) 26.0-26.9, adult: Secondary | ICD-10-CM | POA: Diagnosis not present

## 2018-07-19 DIAGNOSIS — E663 Overweight: Secondary | ICD-10-CM | POA: Diagnosis not present

## 2018-07-19 DIAGNOSIS — J069 Acute upper respiratory infection, unspecified: Secondary | ICD-10-CM | POA: Diagnosis not present

## 2018-07-19 DIAGNOSIS — Z1389 Encounter for screening for other disorder: Secondary | ICD-10-CM | POA: Diagnosis not present

## 2018-09-20 DIAGNOSIS — Z1389 Encounter for screening for other disorder: Secondary | ICD-10-CM | POA: Diagnosis not present

## 2018-09-20 DIAGNOSIS — Z23 Encounter for immunization: Secondary | ICD-10-CM | POA: Diagnosis not present

## 2018-09-20 DIAGNOSIS — I10 Essential (primary) hypertension: Secondary | ICD-10-CM | POA: Diagnosis not present

## 2018-09-20 DIAGNOSIS — R5383 Other fatigue: Secondary | ICD-10-CM | POA: Diagnosis not present

## 2018-09-20 DIAGNOSIS — E559 Vitamin D deficiency, unspecified: Secondary | ICD-10-CM | POA: Diagnosis not present

## 2018-09-20 DIAGNOSIS — Z0001 Encounter for general adult medical examination with abnormal findings: Secondary | ICD-10-CM | POA: Diagnosis not present

## 2018-09-20 DIAGNOSIS — E1029 Type 1 diabetes mellitus with other diabetic kidney complication: Secondary | ICD-10-CM | POA: Diagnosis not present

## 2018-09-20 DIAGNOSIS — Z6825 Body mass index (BMI) 25.0-25.9, adult: Secondary | ICD-10-CM | POA: Diagnosis not present

## 2018-09-20 DIAGNOSIS — E119 Type 2 diabetes mellitus without complications: Secondary | ICD-10-CM | POA: Diagnosis not present

## 2018-12-01 DIAGNOSIS — D126 Benign neoplasm of colon, unspecified: Secondary | ICD-10-CM

## 2018-12-01 HISTORY — DX: Benign neoplasm of colon, unspecified: D12.6

## 2018-12-08 ENCOUNTER — Encounter (HOSPITAL_COMMUNITY): Payer: Self-pay | Admitting: Emergency Medicine

## 2018-12-08 ENCOUNTER — Emergency Department (HOSPITAL_COMMUNITY): Payer: Medicare HMO

## 2018-12-08 ENCOUNTER — Emergency Department (HOSPITAL_COMMUNITY)
Admission: EM | Admit: 2018-12-08 | Discharge: 2018-12-08 | Disposition: A | Discharge status: Home / Self Care | Payer: Medicare HMO | Source: Home / Self Care | Attending: Emergency Medicine | Admitting: Emergency Medicine

## 2018-12-08 ENCOUNTER — Other Ambulatory Visit: Payer: Self-pay

## 2018-12-08 ENCOUNTER — Inpatient Hospital Stay (HOSPITAL_COMMUNITY)
Admission: EM | Admit: 2018-12-08 | Discharge: 2018-12-11 | DRG: 378 | Disposition: A | Discharge status: Home / Self Care | Payer: Medicare HMO | Attending: Internal Medicine | Admitting: Internal Medicine

## 2018-12-08 DIAGNOSIS — Z79899 Other long term (current) drug therapy: Secondary | ICD-10-CM | POA: Diagnosis not present

## 2018-12-08 DIAGNOSIS — K5731 Diverticulosis of large intestine without perforation or abscess with bleeding: Secondary | ICD-10-CM | POA: Diagnosis present

## 2018-12-08 DIAGNOSIS — I10 Essential (primary) hypertension: Secondary | ICD-10-CM | POA: Diagnosis present

## 2018-12-08 DIAGNOSIS — Z7982 Long term (current) use of aspirin: Secondary | ICD-10-CM | POA: Diagnosis not present

## 2018-12-08 DIAGNOSIS — E0789 Other specified disorders of thyroid: Secondary | ICD-10-CM | POA: Diagnosis not present

## 2018-12-08 DIAGNOSIS — K5791 Diverticulosis of intestine, part unspecified, without perforation or abscess with bleeding: Secondary | ICD-10-CM | POA: Diagnosis not present

## 2018-12-08 DIAGNOSIS — K921 Melena: Secondary | ICD-10-CM | POA: Diagnosis present

## 2018-12-08 DIAGNOSIS — D62 Acute posthemorrhagic anemia: Secondary | ICD-10-CM | POA: Diagnosis present

## 2018-12-08 DIAGNOSIS — E876 Hypokalemia: Secondary | ICD-10-CM | POA: Diagnosis present

## 2018-12-08 DIAGNOSIS — D500 Iron deficiency anemia secondary to blood loss (chronic): Secondary | ICD-10-CM

## 2018-12-08 DIAGNOSIS — Z809 Family history of malignant neoplasm, unspecified: Secondary | ICD-10-CM

## 2018-12-08 DIAGNOSIS — R42 Dizziness and giddiness: Secondary | ICD-10-CM | POA: Diagnosis not present

## 2018-12-08 DIAGNOSIS — K635 Polyp of colon: Secondary | ICD-10-CM | POA: Diagnosis present

## 2018-12-08 DIAGNOSIS — R52 Pain, unspecified: Secondary | ICD-10-CM | POA: Diagnosis not present

## 2018-12-08 DIAGNOSIS — E119 Type 2 diabetes mellitus without complications: Secondary | ICD-10-CM | POA: Diagnosis present

## 2018-12-08 DIAGNOSIS — Z90710 Acquired absence of both cervix and uterus: Secondary | ICD-10-CM | POA: Diagnosis not present

## 2018-12-08 DIAGNOSIS — K625 Hemorrhage of anus and rectum: Secondary | ICD-10-CM

## 2018-12-08 DIAGNOSIS — D472 Monoclonal gammopathy: Secondary | ICD-10-CM | POA: Diagnosis present

## 2018-12-08 DIAGNOSIS — I959 Hypotension, unspecified: Secondary | ICD-10-CM | POA: Diagnosis not present

## 2018-12-08 DIAGNOSIS — K5730 Diverticulosis of large intestine without perforation or abscess without bleeding: Secondary | ICD-10-CM | POA: Diagnosis present

## 2018-12-08 DIAGNOSIS — D123 Benign neoplasm of transverse colon: Secondary | ICD-10-CM | POA: Diagnosis not present

## 2018-12-08 DIAGNOSIS — R55 Syncope and collapse: Secondary | ICD-10-CM | POA: Diagnosis not present

## 2018-12-08 DIAGNOSIS — R58 Hemorrhage, not elsewhere classified: Secondary | ICD-10-CM | POA: Diagnosis not present

## 2018-12-08 DIAGNOSIS — K922 Gastrointestinal hemorrhage, unspecified: Secondary | ICD-10-CM | POA: Diagnosis present

## 2018-12-08 DIAGNOSIS — Z87891 Personal history of nicotine dependence: Secondary | ICD-10-CM | POA: Diagnosis not present

## 2018-12-08 DIAGNOSIS — R5381 Other malaise: Secondary | ICD-10-CM | POA: Diagnosis not present

## 2018-12-08 DIAGNOSIS — Z20828 Contact with and (suspected) exposure to other viral communicable diseases: Secondary | ICD-10-CM | POA: Diagnosis present

## 2018-12-08 DIAGNOSIS — Z7984 Long term (current) use of oral hypoglycemic drugs: Secondary | ICD-10-CM | POA: Diagnosis not present

## 2018-12-08 DIAGNOSIS — R109 Unspecified abdominal pain: Secondary | ICD-10-CM | POA: Diagnosis not present

## 2018-12-08 DIAGNOSIS — E049 Nontoxic goiter, unspecified: Secondary | ICD-10-CM | POA: Diagnosis present

## 2018-12-08 LAB — COMPREHENSIVE METABOLIC PANEL
ALT: 20 U/L (ref 0–44)
AST: 27 U/L (ref 15–41)
Albumin: 3.5 g/dL (ref 3.5–5.0)
Alkaline Phosphatase: 40 U/L (ref 38–126)
Anion gap: 9 (ref 5–15)
BUN: 22 mg/dL (ref 8–23)
CO2: 23 mmol/L (ref 22–32)
Calcium: 8.3 mg/dL — ABNORMAL LOW (ref 8.9–10.3)
Chloride: 104 mmol/L (ref 98–111)
Creatinine, Ser: 1.05 mg/dL — ABNORMAL HIGH (ref 0.44–1.00)
GFR calc Af Amer: >60 mL/min (ref >60)
GFR calc non Af Amer: 53 mL/min — ABNORMAL LOW (ref >60)
Glucose, Bld: 170 mg/dL — ABNORMAL HIGH (ref 70–99)
Potassium: 4.4 mmol/L (ref 3.5–5.1)
Sodium: 136 mmol/L (ref 135–145)
Total Bilirubin: 0.9 mg/dL (ref 0.3–1.2)
Total Protein: 7 g/dL (ref 6.5–8.1)

## 2018-12-08 LAB — URINALYSIS, ROUTINE W REFLEX MICROSCOPIC
Bilirubin Urine: NEGATIVE
Glucose, UA: 50 mg/dL — AB
Hgb urine dipstick: NEGATIVE
Ketones, ur: NEGATIVE mg/dL
Leukocytes,Ua: NEGATIVE
Nitrite: NEGATIVE
Protein, ur: NEGATIVE mg/dL
Specific Gravity, Urine: 1.01 (ref 1.005–1.030)
pH: 5 (ref 5.0–8.0)

## 2018-12-08 LAB — CBC
HCT: 29.8 % — ABNORMAL LOW (ref 36.0–46.0)
Hemoglobin: 9 g/dL — ABNORMAL LOW (ref 12.0–15.0)
MCH: 26.9 pg (ref 26.0–34.0)
MCHC: 30.2 g/dL (ref 30.0–36.0)
MCV: 89.2 fL (ref 80.0–100.0)
Platelets: 193 10*3/uL (ref 150–400)
RBC: 3.34 MIL/uL — ABNORMAL LOW (ref 3.87–5.11)
RDW: 13.4 % (ref 11.5–15.5)
WBC: 6.7 10*3/uL (ref 4.0–10.5)
nRBC: 0 % (ref 0.0–0.2)

## 2018-12-08 LAB — CBC WITH DIFFERENTIAL/PLATELET
Abs Immature Granulocytes: 0.03 10*3/uL (ref 0.00–0.07)
Basophils Absolute: 0 10*3/uL (ref 0.0–0.1)
Basophils Relative: 1 %
Eosinophils Absolute: 0.1 10*3/uL (ref 0.0–0.5)
Eosinophils Relative: 2 %
HCT: 33.2 % — ABNORMAL LOW (ref 36.0–46.0)
Hemoglobin: 10.2 g/dL — ABNORMAL LOW (ref 12.0–15.0)
Immature Granulocytes: 1 %
Lymphocytes Relative: 28 %
Lymphs Abs: 1.7 10*3/uL (ref 0.7–4.0)
MCH: 27.1 pg (ref 26.0–34.0)
MCHC: 30.7 g/dL (ref 30.0–36.0)
MCV: 88.1 fL (ref 80.0–100.0)
Monocytes Absolute: 0.6 10*3/uL (ref 0.1–1.0)
Monocytes Relative: 10 %
Neutro Abs: 3.6 10*3/uL (ref 1.7–7.7)
Neutrophils Relative %: 58 %
Platelets: 192 10*3/uL (ref 150–400)
RBC: 3.77 MIL/uL — ABNORMAL LOW (ref 3.87–5.11)
RDW: 13.5 % (ref 11.5–15.5)
WBC: 6 10*3/uL (ref 4.0–10.5)
nRBC: 0 % (ref 0.0–0.2)

## 2018-12-08 LAB — LACTIC ACID, PLASMA
Lactic Acid, Venous: 2.9 mmol/L (ref 0.5–1.9)
Lactic Acid, Venous: 1.8 mmol/L (ref 0.5–1.9)

## 2018-12-08 LAB — LIPASE, BLOOD: Lipase: 44 U/L (ref 11–51)

## 2018-12-08 LAB — GLUCOSE, CAPILLARY: Glucose-Capillary: 124 mg/dL — ABNORMAL HIGH (ref 70–99)

## 2018-12-08 LAB — SARS CORONAVIRUS 2 BY RT PCR (HOSPITAL ORDER, PERFORMED IN ~~LOC~~ HOSPITAL LAB): SARS Coronavirus 2: NEGATIVE

## 2018-12-08 MED ORDER — INSULIN ASPART 100 UNIT/ML ~~LOC~~ SOLN
0.0000 [IU] | Freq: Three times a day (TID) | SUBCUTANEOUS | Status: DC
  Administered 2018-12-09: 3 [IU] via SUBCUTANEOUS
  Administered 2018-12-10: 2 [IU] via SUBCUTANEOUS
  Administered 2018-12-10: 5 [IU] via SUBCUTANEOUS

## 2018-12-08 MED ORDER — LOSARTAN POTASSIUM 50 MG PO TABS: 100.0000 mg | ORAL_TABLET | Freq: Every day | ORAL | Status: DC

## 2018-12-08 MED ORDER — ONDANSETRON HCL 4 MG PO TABS: 4.0000 mg | ORAL_TABLET | Freq: Four times a day (QID) | ORAL | Status: DC | PRN

## 2018-12-08 MED ORDER — IOHEXOL 300 MG/ML  SOLN
80.0000 mL | Freq: Once | INTRAMUSCULAR | Status: AC | PRN
  Administered 2018-12-08: 80 mL via INTRAVENOUS

## 2018-12-08 MED ORDER — AMLODIPINE BESYLATE 5 MG PO TABS
10.0000 mg | ORAL_TABLET | Freq: Every day | ORAL | Status: DC
  Administered 2018-12-08: 10 mg via ORAL
  Filled 2018-12-08: qty 2

## 2018-12-08 MED ORDER — ONDANSETRON HCL 4 MG/2ML IJ SOLN
4.0000 mg | Freq: Once | INTRAMUSCULAR | Status: AC
  Administered 2018-12-08: 4 mg via INTRAVENOUS
  Filled 2018-12-08: qty 2

## 2018-12-08 MED ORDER — SODIUM CHLORIDE 0.9 % IV BOLUS
1000.0000 mL | Freq: Once | INTRAVENOUS | Status: AC
  Administered 2018-12-08: 1000 mL via INTRAVENOUS

## 2018-12-08 MED ORDER — ONDANSETRON HCL 4 MG/2ML IJ SOLN: 4.0000 mg | Freq: Four times a day (QID) | INTRAMUSCULAR | Status: DC | PRN

## 2018-12-08 MED ORDER — SODIUM CHLORIDE 0.9 % IV BOLUS
500.0000 mL | Freq: Once | INTRAVENOUS | Status: AC
  Administered 2018-12-08: 500 mL via INTRAVENOUS

## 2018-12-08 NOTE — ED Notes (Signed)
Date and time results received: 12/08/18 20:29 (Test: lactic acid Critical Value: 2.9  Name of Provider Notified: Nehemiah Settle MD  Orders Received? Or Actions Taken?:Dr. Nehemiah Settle notified

## 2018-12-08 NOTE — ED Provider Notes (Signed)
[facility] EMERGENCY DEPARTMENT Provider Note   CSN: [id number] Arrival date & time: 12/08/18  1834    History   Chief Complaint Chief Complaint  Patient presents with  . Rectal Bleeding    HPI Melanie Welch is a 73 y.o. female.     HPI  73 year old female presents from home with recurrent GI bleeding.  She was just seen earlier in the day for 2 episodes of rectal bleeding, one last night and one today.  She was seen here, had labs and was discharged home feeling pretty good.  After getting home, she had a bloody bowel movement that had a large amount of clot in it.  She states that when she stood up she felt lightheaded and might of passed out.  She currently feels nauseated.  EMS reports her vitals have been stable. No abdominal pain.   Past Medical History:  Diagnosis Date  . Diabetes mellitus   . Hypertension   . MGUS (monoclonal gammopathy of unknown significance)   . Proteinuria     Patient Active Problem List   Diagnosis Date Noted  . 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 Diabetes mellitus without complication ([place]) 05/26/8526  . Essential hypertension, benign 05/22/2015  . Palpable thyroid 08/29/2014  . MGUS (monoclonal gammopathy of unknown significance) 08/28/2014    Past Surgical History:  Procedure Laterality Date  . ABDOMINAL HYSTERECTOMY    . BREAST BIOPSY  2005   left  . CATARACT EXTRACTION W/PHACO Right 05/10/2017   Procedure: CATARACT EXTRACTION PHACO AND INTRAOCULAR LENS PLACEMENT (IOC);  Surgeon: Rutherford Guys, MD;  Location: [facility] ORS;  Service: Ophthalmology;  Laterality: Right;  CDE: 5.04  . CATARACT EXTRACTION W/PHACO Left 05/24/2017   Procedure: CATARACT EXTRACTION PHACO AND INTRAOCULAR LENS PLACEMENT (IOC);  Surgeon: Rutherford Guys, MD;  Location: [facility] ORS;  Service: Ophthalmology;  Laterality: Left;  CDE: 8.40  . COLONOSCOPY  07/13/2011   Procedure: COLONOSCOPY;  Surgeon: Dorothyann Peng, MD;  Location: [facility] ENDO SUITE;  Service: Endoscopy;   Laterality: N/A;  9:30 AM     OB History   No obstetric history on file.      Home Medications    Prior to Admission medications   Medication Sig Start Date End Date Taking? Authorizing Provider  amLODipine (NORVASC) 10 MG tablet Take 10 mg by mouth at bedtime.     [provider]  aspirin EC 81 MG tablet Take 81 mg by mouth daily.    [provider]  Difluprednate 0.05 % EMUL Apply 1 drop to eye 2 (two) times daily. 04/26/17   [provider]  losartan (COZAAR) 100 MG tablet Take 100 mg by mouth daily.    [provider]  metFORMIN (GLUCOPHAGE) 1000 MG tablet Take 1,000 mg by mouth 2 (two) times daily. 03/11/17   [provider]  ofloxacin (OCUFLOX) 0.3 % ophthalmic solution 1 drop 2 (two) times daily. 04/26/17   [provider]  Polyethyl Glycol-Propyl Glycol (SYSTANE) 0.4-0.3 % SOLN Place 1 drop into both eyes 2 (two) times daily. IN THE MORNING & NOON.    [provider]    Family History Family History  Problem Relation Age of Onset  . Cancer Mother   . Cancer Sister     Social History Social History   Tobacco Use  . Smoking status: Former Research scientist (life sciences)  . Smokeless tobacco: Never Used  Substance Use Topics  . Alcohol use: Yes  . Drug use: No     Allergies  Patient has no known allergies.   Review of Systems Review of Systems  Respiratory: Negative for shortness of breath.   Cardiovascular: Negative for chest pain.  Gastrointestinal: Positive for blood in stool and nausea. Negative for abdominal pain and vomiting.  All other systems reviewed and are negative.    Physical Exam Updated Vital Signs BP 119/68   Pulse 72   Temp 98.3 F (36.8 C) (Oral)   Resp 16   Ht 5\' 8"  (1.727 m)   Wt 74.8 kg   SpO2 97%   BMI 25.09 kg/m   Physical Exam Vitals signs and nursing note reviewed.  Constitutional:      Appearance: She is well-developed.  HENT:     Head: Normocephalic and atraumatic.     Right  Ear: External ear normal.     Left Ear: External ear normal.     Nose: Nose normal.  Eyes:     General:        Right eye: No discharge.        Left eye: No discharge.  Cardiovascular:     Rate and Rhythm: Normal rate and regular rhythm.     Heart sounds: Normal heart sounds.  Pulmonary:     Effort: Pulmonary effort is normal.     Breath sounds: Normal breath sounds.  Abdominal:     Palpations: Abdomen is soft.     Tenderness: There is no abdominal tenderness.  Skin:    General: Skin is warm and dry.  Neurological:     Mental Status: She is alert.  Psychiatric:        Mood and Affect: Mood is not anxious.      ED Treatments / Results  Labs (all labs ordered are listed, but only abnormal results are displayed) Labs Reviewed  CBC - Abnormal; Notable for the following components:      Result Value   RBC 3.34 (*)    Hemoglobin 9.0 (*)    HCT 29.8 (*)    All other components within normal limits  SARS CORONAVIRUS 2 (HOSPITAL ORDER, [facility])  LACTIC ACID, PLASMA  LACTIC ACID, PLASMA  TYPE AND SCREEN    EKG EKG Interpretation  Date/Time:  Friday Dec 08 2018 18:44:37 EDT Ventricular Rate:  75 PR Interval:    QRS Duration: 85 QT Interval:  349 QTC Calculation: 390 R Axis:   -13 Text Interpretation:  Sinus rhythm Low voltage, precordial leads Abnormal R-wave progression, early transition Borderline T wave abnormalities No significant change since earlier in the day Confirmed by Sherwood Gambler [phone]) on 12/08/2018 7:00:19 PM   Radiology Ct Abdomen Pelvis W Contrast  Result Date: 12/08/2018 CLINICAL DATA:  Abdominal pain.  Bloody stool.  Near syncope. EXAM: CT ABDOMEN AND PELVIS WITH CONTRAST TECHNIQUE: Multidetector CT imaging of the abdomen and pelvis was performed using the standard protocol following bolus administration of intravenous contrast. CONTRAST:  76mL OMNIPAQUE IOHEXOL 300 MG/ML  SOLN COMPARISON:  None. FINDINGS: Lower chest:  Normal. Hepatobiliary: Slight hepatic steatosis. No focal lesions. Biliary tree is normal. Pancreas: Unremarkable. No pancreatic ductal dilatation or surrounding inflammatory changes. Spleen: Normal in size without focal abnormality. Adrenals/Urinary Tract: There is a 2.5 cm slightly complex exophytic cyst on the lower pole of the left kidney. There is an area of slightly increased density at the lateral aspect of this cyst. The kidneys are otherwise normal. No hydronephrosis. Bladder is normal. Adrenal glands are normal. Stomach/Bowel: There is extensive diverticulosis throughout the entire  colon without evidence of diverticulitis. Moderate amount of stool in the rectum. Appendix is normal. Small bowel and stomach are normal. Vascular/Lymphatic: Minimal aortic atherosclerosis.  No adenopathy. Reproductive: Status post hysterectomy. No adnexal masses. Other: No abdominal wall hernia or abnormality. No abdominopelvic ascites. Musculoskeletal: No acute abnormality. 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 IMPRESSION: 1. Extensive diverticulosis of the entire colon. No evidence of acute diverticulitis. 2. Complex cystic lesion on the lower pole of the left kidney. I recommend MRI of the kidneys with and without contrast for further characterization. Electronically Signed   By: Lorriane Shire M.D.   On: 12/08/2018 14:22    Procedures Procedures (including critical care time)  Medications Ordered in ED Medications  sodium chloride 0.9 % bolus 1,000 mL (1,000 mLs Intravenous New Bag/Given 12/08/18 1854)  ondansetron (ZOFRAN) injection 4 mg (4 mg Intravenous Given 12/08/18 1907)     Initial Impression / Assessment and Plan / ED Course  I have reviewed the triage vital signs and the nursing notes.  Pertinent labs & imaging results that were available during my care of the patient were reviewed by me and considered in my medical decision making (see chart for details).        Patient's vital  signs are stable.  She has lost more blood and her hemoglobin is down to 9.  She was given Zofran and her nausea has resolved.  Given no abdominal tenderness now, probably a diverticular bleed.  Will need admission for supportive care and monitoring of her hemoglobin.  Dr. Nehemiah Settle to admit.  Final Clinical Impressions(s) / ED Diagnoses   Final diagnoses:  Rectal bleeding  Blood loss anemia    ED Discharge Orders    None       Sherwood Gambler, MD 12/08/18 2014

## 2018-12-08 NOTE — ED Notes (Signed)
Lab and phlebotomist called re lactic acid

## 2018-12-08 NOTE — ED Provider Notes (Signed)
[facility] EMERGENCY DEPARTMENT Provider Note   CSN: [id number] Arrival date & time: 12/08/18  1224    History   Chief Complaint Chief Complaint  Patient presents with   Abdominal Pain    HPI Melanie Welch is a 73 y.o. female.     HPI 73 year old female presents with abdominal pain and blood in her stool.  She states the abdominal pain started 2 days ago and feels dull in her lower abdomen.  At this time is not that bad and she rates as about a 5 out of 10.  Last night she had blood in her stool, more blood than stool.  Today, she had a bowel movement and after the bowel movement felt lightheaded and had to lower herself to the ground but did not pass out.  There was blood in the stool as well.  She is on aspirin but no other blood thinners.  She denies any chest pain.  Has never had this before.  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   Diabetes mellitus without complication ([place]) 27/74/1287   Essential hypertension, benign 05/22/2015   Palpable thyroid 08/29/2014   MGUS (monoclonal gammopathy of unknown significance) 08/28/2014    Past Surgical History:  Procedure Laterality Date   ABDOMINAL HYSTERECTOMY     BREAST BIOPSY  2005   left   CATARACT EXTRACTION W/PHACO Right 05/10/2017   Procedure: CATARACT EXTRACTION PHACO AND INTRAOCULAR LENS PLACEMENT ([place]);  Surgeon: Rutherford Guys, MD;  Location: [facility] ORS;  Service: Ophthalmology;  Laterality: Right;  CDE: 5.04   CATARACT EXTRACTION W/PHACO Left 05/24/2017   Procedure: CATARACT EXTRACTION PHACO AND INTRAOCULAR LENS PLACEMENT (IOC);  Surgeon: Rutherford Guys, MD;  Location: [facility] ORS;  Service: Ophthalmology;  Laterality: Left;  CDE: 8.40   COLONOSCOPY  07/13/2011   Procedure: COLONOSCOPY;  Surgeon: Dorothyann Peng, MD;  Location: [facility] ENDO SUITE;  Service: Endoscopy;  Laterality: N/A;  9:30 AM     OB History   No obstetric history on file.      Home Medications    Prior to Admission medications   Medication Sig Start Date End Date Taking? Authorizing Provider  amLODipine (NORVASC) 10 MG tablet Take 10 mg by mouth at bedtime.     [provider]  aspirin EC 81 MG tablet Take 81 mg by mouth daily.    [provider]  Difluprednate 0.05 % EMUL Apply 1 drop to eye 2 (two) times daily. 04/26/17   [provider]  losartan (COZAAR) 100 MG tablet Take 100 mg by mouth daily.    [provider]  metFORMIN (GLUCOPHAGE) 1000 MG tablet Take 1,000 mg by mouth 2 (two) times daily. 03/11/17   [provider]  ofloxacin (OCUFLOX) 0.3 % ophthalmic solution 1 drop 2 (two) times daily. 04/26/17   [provider]  Polyethyl Glycol-Propyl Glycol (SYSTANE) 0.4-0.3 % SOLN Place 1 drop into both eyes 2 (two) times daily. IN THE MORNING & NOON.    [provider]    Family History Family History  Problem Relation Age of Onset   Cancer Mother    Cancer Sister     Social History Social History   Tobacco Use   Smoking status: Former Smoker   Smokeless tobacco: Never Used  Substance Use Topics   Alcohol use: Yes   Drug use: No     Allergies   Patient has no known allergies.   Review of Systems Review of Systems  Constitutional: Negative for fever.  Cardiovascular: Negative for chest pain.  Gastrointestinal: Positive for abdominal pain and blood in stool. Negative for vomiting.  Neurological: Positive for light-headedness. Negative for syncope.  All other systems reviewed and are negative.    Physical Exam Updated Vital Signs BP (!) 142/88    Pulse 77    Temp 98.1 F (36.7 C) (Oral)    Resp (!) 21    Ht 5\' 8"  (1.727 m)    Wt 74.8 kg    SpO2 99%    BMI 25.09 kg/m   Physical Exam Vitals signs and nursing note reviewed. Exam  conducted with a chaperone present.  Constitutional:      General: She is not in acute distress.    Appearance: She is well-developed. She is not ill-appearing or diaphoretic.  HENT:     Head: Normocephalic and atraumatic.     Right Ear: External ear normal.     Left Ear: External ear normal.     Nose: Nose normal.  Eyes:     General:        Right eye: No discharge.        Left eye: No discharge.  Cardiovascular:     Rate and Rhythm: Normal rate and regular rhythm.     Heart sounds: Normal heart sounds.  Pulmonary:     Effort: Pulmonary effort is normal.     Breath sounds: Normal breath sounds.  Abdominal:     Palpations: Abdomen is soft.     Tenderness: There is abdominal tenderness (mild) in the left lower quadrant.  Genitourinary:    Comments: No external hemorrhoids or masses. No tenderness on exam. Dark red blood on DRE Skin:    General: Skin is warm and dry.  Neurological:     Mental Status: She is alert.  Psychiatric:        Mood and Affect: Mood is not anxious.      ED Treatments / Results  Labs (all labs ordered are listed, but only abnormal results are displayed) Labs Reviewed  COMPREHENSIVE METABOLIC PANEL - Abnormal; Notable for the following components:      Result Value   Glucose, Bld 170 (*)    Creatinine, Ser 1.05 (*)    Calcium 8.3 (*)    GFR calc non Af Amer 53 (*)    All other components within normal limits  URINALYSIS, ROUTINE W REFLEX MICROSCOPIC - Abnormal; Notable for the following components:   Color, Urine STRAW (*)    Glucose, UA 50 (*)    All other components within normal limits  CBC WITH DIFFERENTIAL/PLATELET - Abnormal; Notable for the following components:   RBC 3.77 (*)    Hemoglobin 10.2 (*)    HCT 33.2 (*)    All other components within normal limits  LIPASE, BLOOD    EKG EKG Interpretation  Date/Time:  Friday Dec 08 2018 12:43:49 EDT Ventricular Rate:  80 PR Interval:    QRS Duration: 63 QT Interval:  470 QTC  Calculation: 543 R Axis:   -23 Text Interpretation:  Sinus rhythm Borderline left axis deviation Abnormal R-wave progression, early transition Borderline abnrm T, anterolateral leads no significant change since Oct 2018 Confirmed by Sherwood Gambler [phone]) on 12/08/2018 12:50:24 PM   Radiology Ct Abdomen Pelvis W Contrast  Result Date: 12/08/2018 CLINICAL DATA:  Abdominal pain.  Bloody stool.  Near syncope. EXAM: CT ABDOMEN AND PELVIS WITH CONTRAST TECHNIQUE: Multidetector CT imaging of the abdomen and pelvis was performed using the standard protocol following bolus administration of intravenous contrast. CONTRAST:  8mL OMNIPAQUE IOHEXOL 300 MG/ML  SOLN COMPARISON:  None. FINDINGS: Lower chest: Normal. Hepatobiliary: Slight hepatic steatosis. No focal lesions. Biliary tree is normal. Pancreas: Unremarkable. No pancreatic ductal dilatation or surrounding inflammatory changes. Spleen: Normal in size without focal abnormality. Adrenals/Urinary Tract: There is a 2.5 cm slightly complex exophytic cyst on the lower pole of the left kidney. There is an area of slightly increased density at the lateral aspect of this cyst. The kidneys are otherwise normal. No hydronephrosis. Bladder is normal. Adrenal glands are normal. Stomach/Bowel: There is extensive diverticulosis throughout the entire colon without evidence of diverticulitis. Moderate amount of stool in the rectum. Appendix is normal. Small bowel and stomach are normal. Vascular/Lymphatic: Minimal aortic atherosclerosis.  No adenopathy. Reproductive: Status post hysterectomy. No adnexal masses. Other: No abdominal wall hernia or abnormality. No abdominopelvic ascites. Musculoskeletal: No acute abnormality. 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 IMPRESSION: 1. Extensive diverticulosis of the entire colon. No evidence of acute diverticulitis. 2. Complex cystic lesion on the lower pole of the left kidney. I recommend MRI of the kidneys with and  without contrast for further characterization. Electronically Signed   By: Lorriane Shire M.D.   On: 12/08/2018 14:22    Procedures Procedures (including critical care time)  Medications Ordered in ED Medications  sodium chloride 0.9 % bolus 500 mL (0 mLs Intravenous Stopped 12/08/18 1339)  iohexol (OMNIPAQUE) 300 MG/ML solution 80 mL (80 mLs Intravenous Contrast Given 12/08/18 1352)  sodium chloride 0.9 % bolus 500 mL (0 mLs Intravenous Stopped 12/08/18 1607)     Initial Impression / Assessment and Plan / ED Course  I have reviewed the triage vital signs and the nursing notes.  Pertinent labs & imaging results that were available during my care of the patient were reviewed by me and considered in my medical decision making (see chart for details).        Patient appears well here and has stable vital signs.  The lightheadedness she experienced occurred after standing up after using the bathroom.  This has been unable to be reproduced on orthostatics.  Likely was from the sudden change in position, much less suspicious for cardiac arrhythmia or severe volume depletion.  CT is unremarkable.  Mildly dropped hemoglobin compared to baseline but is still above 10.  I discussed with Dr. Gala Romney, who feels it sounds like mild ischemic colitis.  Given no findings on CT, minimal to no pain, and feeling well, she would not need to be admitted necessarily.  Patient feels comfortable going home.  We will recommend clear liquid diet tonight and if still doing okay then she can advance tomorrow.  Strict return precautions.  I have given her IV fluids. No diarrhea or infectious like symptoms.  Final Clinical Impressions(s) / ED Diagnoses   Final diagnoses:  Rectal bleeding    ED Discharge Orders    None       Sherwood Gambler, MD 12/08/18 [phone]

## 2018-12-08 NOTE — ED Notes (Signed)
Pt out of bed to BR

## 2018-12-08 NOTE — H&P (Signed)
History and Physical  Melanie Welch GGE:[id number] DOB: [birth year] DOA: 12/08/2018  Referring physician: Dr Regenia Skeeter, ED physician PCP: [name], MD  Outpatient Specialists:   Patient Coming From: home  Chief Complaint: rectal bleeding  HPI: Melanie Welch is a 73 y.o. female with a history of diabetes, hypertension, MGUS.  Patient seen earlier today for lower quadrant abdominal pain.  Patient had a CT scan which showed diverticulosis without infection and was sent home after pain had improved.  Patient had episode of bloody stool with large blood clot.  She immediately felt weak and lightheaded which gradually improved.  She came back to the hospital for evaluation.  No palliating or provoking factors.  Had maroon bloody stool on rectal exam.  No current abdominal pain.  No lightheadedness, dizziness.  Emergency Department Course: Hemoglobin dropped from 10.2-9.0 in the span of 6 hours.  Review of Systems:   Pt denies any fevers, chills, nausea, vomiting, diarrhea, constipation, abdominal pain, shortness of breath, dyspnea on exertion, orthopnea, cough, wheezing, palpitations, headache, vision changes, lightheadedness, dizziness, melena, rectal bleeding.  Review of systems are otherwise negative  Past Medical History:  Diagnosis Date   Diabetes mellitus    Hypertension    MGUS (monoclonal gammopathy of unknown significance)    Proteinuria    Past Surgical History:  Procedure Laterality Date   ABDOMINAL HYSTERECTOMY     BREAST BIOPSY  2005   left   CATARACT EXTRACTION W/PHACO Right 05/10/2017   Procedure: CATARACT EXTRACTION PHACO AND INTRAOCULAR LENS PLACEMENT (IOC);  Surgeon: Rutherford Guys, MD;  Location: [facility];  Service: Ophthalmology;  Laterality: Right;  CDE: 5.04   CATARACT EXTRACTION W/PHACO Left 05/24/2017   Procedure: CATARACT EXTRACTION PHACO AND INTRAOCULAR LENS PLACEMENT (IOC);  Surgeon: Rutherford Guys, MD;  Location: [facility];  Service: Ophthalmology;   Laterality: Left;  CDE: 8.40   COLONOSCOPY  07/13/2011   Procedure: COLONOSCOPY;  Surgeon: Dorothyann Peng, MD;  Location: [facility];  Service: Endoscopy;  Laterality: N/A;  9:30 AM   Social History:  reports that she has quit smoking. She has never used smokeless tobacco. She reports current alcohol use. She reports that she does not use drugs. Patient lives at home  No Known Allergies  Family History  Problem Relation Age of Onset   Cancer Mother    Cancer Sister       Prior to Admission medications   Medication Sig Start Date End Date Taking? Authorizing Provider  amLODipine (NORVASC) 10 MG tablet Take 10 mg by mouth at bedtime.     [provider]  aspirin EC 81 MG tablet Take 81 mg by mouth daily.    [provider]  Difluprednate 0.05 % EMUL Apply 1 drop to eye 2 (two) times daily. 04/26/17   [provider]  losartan (COZAAR) 100 MG tablet Take 100 mg by mouth daily.    [provider]  metFORMIN (GLUCOPHAGE) 1000 MG tablet Take 1,000 mg by mouth 2 (two) times daily. 03/11/17   [provider]  ofloxacin (OCUFLOX) 0.3 % ophthalmic solution 1 drop 2 (two) times daily. 04/26/17   [provider]  Polyethyl Glycol-Propyl Glycol (SYSTANE) 0.4-0.3 % SOLN Place 1 drop into both eyes 2 (two) times daily. IN THE MORNING & NOON.    [provider]    Physical Exam: BP 132/78    Pulse 74    Temp 98.3 F (36.8 C) (Oral)    Resp 18    Ht  5\' 8"  (1.727 m)    Wt 74.8 kg    SpO2 100%    BMI 25.09 kg/m    General: Elderly black female. Awake and alert and oriented x3. No acute cardiopulmonary distress.   HEENT: Normocephalic atraumatic.  Right and left ears normal in appearance.  Pupils equal, round, reactive to light. Extraocular muscles are intact. Sclerae anicteric and noninjected.  Moist mucosal membranes. No mucosal lesions.   Neck: Neck supple without lymphadenopathy. No carotid bruits. No masses palpated.    Cardiovascular: Regular rate with normal S1-S2 sounds. No murmurs, rubs, gallops auscultated. No JVD.   Respiratory: Good respiratory effort with no wheezes, rales, rhonchi. Lungs clear to auscultation bilaterally.  No accessory muscle use.  Abdomen: Soft, nontender, nondistended. Active bowel sounds. No masses or hepatosplenomegaly   Skin: No rashes, lesions, or ulcerations.  Dry, warm to touch. 2+ dorsalis pedis and radial pulses.  Musculoskeletal: No calf or leg pain. All major joints not erythematous nontender.  No upper or lower joint deformation.  Good ROM.  No contractures   Psychiatric: Intact judgment and insight. Pleasant and cooperative.  Neurologic: No focal neurological deficits. Strength is 5/5 and symmetric in upper and lower extremities.  Cranial nerves II through XII are grossly intact.           Labs on Admission: I have personally reviewed following labs and imaging studies  CBC: Recent Labs  Lab 12/08/18 1245 12/08/18 1851  WBC 6.0 6.7  NEUTROABS 3.6  --   HGB 10.2* 9.0*  HCT 33.2* 29.8*  MCV 88.1 89.2  PLT 192 161   Basic Metabolic Panel: Recent Labs  Lab 12/08/18 1245  NA 136  K 4.4  CL 104  CO2 23  GLUCOSE 170*  BUN 22  CREATININE 1.05*  CALCIUM 8.3*   GFR: Estimated Creatinine Clearance: 48.9 mL/min (A) (by C-G formula based on SCr of 1.05 mg/dL (H)). Liver Function Tests: Recent Labs  Lab 12/08/18 1245  AST 27  ALT 20  ALKPHOS 40  BILITOT 0.9  PROT 7.0  ALBUMIN 3.5   Recent Labs  Lab 12/08/18 1245  LIPASE 44   No results for input(s): AMMONIA in the last 168 hours. Coagulation Profile: No results for input(s): INR, PROTIME in the last 168 hours. Cardiac Enzymes: No results for input(s): CKTOTAL, CKMB, CKMBINDEX, TROPONINI in the last 168 hours. BNP (last 3 results) No results for input(s): PROBNP in the last 8760 hours. HbA1C: No results for input(s): HGBA1C in the last 72 hours. CBG: No results for input(s): GLUCAP  in the last 168 hours. Lipid Profile: No results for input(s): CHOL, HDL, LDLCALC, TRIG, CHOLHDL, LDLDIRECT in the last 72 hours. Thyroid Function Tests: No results for input(s): TSH, T4TOTAL, FREET4, T3FREE, THYROIDAB in the last 72 hours. Anemia Panel: No results for input(s): VITAMINB12, FOLATE, FERRITIN, TIBC, IRON, RETICCTPCT in the last 72 hours. Urine analysis:    Component Value Date/Time   COLORURINE STRAW (A) 12/08/2018 1340   APPEARANCEUR CLEAR 12/08/2018 1340   LABSPEC 1.010 12/08/2018 1340   PHURINE 5.0 12/08/2018 1340   GLUCOSEU 50 (A) 12/08/2018 1340   HGBUR NEGATIVE 12/08/2018 1340   BILIRUBINUR NEGATIVE 12/08/2018 1340   KETONESUR NEGATIVE 12/08/2018 1340   PROTEINUR NEGATIVE 12/08/2018 1340   NITRITE NEGATIVE 12/08/2018 1340   LEUKOCYTESUR NEGATIVE 12/08/2018 1340   Sepsis Labs: @LABRCNTIP (procalcitonin:4,lacticidven:4) )No results found for this or any previous visit (from the past 240 hour(s)).   Radiological Exams on Admission: Ct Abdomen Pelvis W Contrast  Result Date: 12/08/2018 CLINICAL DATA:  Abdominal pain.  Bloody stool.  Near syncope. EXAM: CT ABDOMEN AND PELVIS WITH CONTRAST TECHNIQUE: Multidetector CT imaging of the abdomen and pelvis was performed using the standard protocol following bolus administration of intravenous contrast. CONTRAST:  62mL OMNIPAQUE IOHEXOL 300 MG/ML  SOLN COMPARISON:  None. FINDINGS: Lower chest: Normal. Hepatobiliary: Slight hepatic steatosis. No focal lesions. Biliary tree is normal. Pancreas: Unremarkable. No pancreatic ductal dilatation or surrounding inflammatory changes. Spleen: Normal in size without focal abnormality. Adrenals/Urinary Tract: There is a 2.5 cm slightly complex exophytic cyst on the lower pole of the left kidney. There is an area of slightly increased density at the lateral aspect of this cyst. The kidneys are otherwise normal. No hydronephrosis. Bladder is normal. Adrenal glands are normal. Stomach/Bowel: There  is extensive diverticulosis throughout the entire colon without evidence of diverticulitis. Moderate amount of stool in the rectum. Appendix is normal. Small bowel and stomach are normal. Vascular/Lymphatic: Minimal aortic atherosclerosis.  No adenopathy. Reproductive: Status post hysterectomy. No adnexal masses. Other: No abdominal wall hernia or abnormality. No abdominopelvic ascites. Musculoskeletal: No acute abnormality. 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 IMPRESSION: 1. Extensive diverticulosis of the entire colon. No evidence of acute diverticulitis. 2. Complex cystic lesion on the lower pole of the left kidney. I recommend MRI of the kidneys with and without contrast for further characterization. Electronically Signed   By: Lorriane Shire M.D.   On: 12/08/2018 14:22    EKG: Independently reviewed.  This rhythm with early R wave progression.  No acute ST changes.  Assessment/Plan: Active Problems:   Palpable thyroid   Diabetes mellitus without complication (HCC)   Essential hypertension, benign   Lower GI bleed    This patient was discussed with the ED physician, including pertinent vitals, physical exam findings, labs, and imaging.  We also discussed care given by the ED provider.  1. Lower GI bleed a. Likely diverticular bleed b. Observation in stepdown unit given drop by a gram c. Recheck CBC every 6 hours d. Patient typed and screened 2. Hypertension a. Continue antihypertensives 3. Diabetes a. Hold metformin due to contrast earlier today b. CBGs before meals and nightly 4. Thyroid  DVT prophylaxis: SCDs Consultants: None Code Status: Full code Family Communication: None Disposition Plan: Patient should be able to return home following stabilization   Truett Mainland, DO

## 2018-12-08 NOTE — ED Notes (Signed)
From CT 

## 2018-12-08 NOTE — ED Triage Notes (Signed)
Pt was just discharged with diverticulosis. Pt went home and had multiple bloody bowel movements. Denies pain at this time

## 2018-12-08 NOTE — ED Notes (Signed)
Pt reported to EDP a near syncopal episode with blood in her stool   She reports that she had bleeding x 2 nights

## 2018-12-08 NOTE — ED Notes (Signed)
Pt was informed that we need a urine sample. 

## 2018-12-08 NOTE — ED Triage Notes (Signed)
abd  Pain x 3 days - bloody stool today   Here by EMS - no IV  Dr Armandina Gemma is MD

## 2018-12-08 NOTE — ED Notes (Signed)
Melanie Welch daughter- [phone]

## 2018-12-08 NOTE — ED Notes (Signed)
Lab at bedside

## 2018-12-08 NOTE — Care Management Obs Status (Signed)
[place] NOTIFICATION   Patient Details  Name: Melanie Welch MRN: [id number] Date of Birth: 05-Aug-1945   Medicare Observation Status Notification Given:  Yes    [place], LCSW 12/08/2018, 11:18 PM

## 2018-12-08 NOTE — Discharge Instructions (Addendum)
If you develop worsening, continued, or recurrent abdominal pain, uncontrolled vomiting, fever, chest or back pain, blood in your stool, or any other new/concerning symptoms then return to the ER for evaluation.

## 2018-12-08 NOTE — ED Notes (Signed)
Pt on phone with daughter, Nelda Marseille is enroute from [place]

## 2018-12-09 ENCOUNTER — Encounter (HOSPITAL_COMMUNITY): Payer: Self-pay | Admitting: *Deleted

## 2018-12-09 ENCOUNTER — Encounter (HOSPITAL_COMMUNITY)
Admission: EM | Disposition: A | Discharge status: Home / Self Care | Payer: Self-pay | Source: Home / Self Care | Attending: Internal Medicine

## 2018-12-09 DIAGNOSIS — D123 Benign neoplasm of transverse colon: Secondary | ICD-10-CM | POA: Diagnosis not present

## 2018-12-09 DIAGNOSIS — K921 Melena: Secondary | ICD-10-CM

## 2018-12-09 DIAGNOSIS — K635 Polyp of colon: Secondary | ICD-10-CM

## 2018-12-09 DIAGNOSIS — K5730 Diverticulosis of large intestine without perforation or abscess without bleeding: Secondary | ICD-10-CM

## 2018-12-09 HISTORY — PX: COLONOSCOPY: SHX5424

## 2018-12-09 HISTORY — PX: POLYPECTOMY: SHX5525

## 2018-12-09 LAB — CBC
HCT: 26.7 % — ABNORMAL LOW (ref 36.0–46.0)
HCT: 25.5 % — ABNORMAL LOW (ref 36.0–46.0)
Hemoglobin: 8.4 g/dL — ABNORMAL LOW (ref 12.0–15.0)
Hemoglobin: 7.7 g/dL — ABNORMAL LOW (ref 12.0–15.0)
MCH: 27.5 pg (ref 26.0–34.0)
MCH: 26.4 pg (ref 26.0–34.0)
MCHC: 31.5 g/dL (ref 30.0–36.0)
MCHC: 30.2 g/dL (ref 30.0–36.0)
MCV: 87.5 fL (ref 80.0–100.0)
MCV: 87.3 fL (ref 80.0–100.0)
Platelets: 174 10*3/uL (ref 150–400)
Platelets: 177 10*3/uL (ref 150–400)
RBC: 3.05 MIL/uL — ABNORMAL LOW (ref 3.87–5.11)
RBC: 2.92 MIL/uL — ABNORMAL LOW (ref 3.87–5.11)
RDW: 13.6 % (ref 11.5–15.5)
RDW: 13.4 % (ref 11.5–15.5)
WBC: 7.5 10*3/uL (ref 4.0–10.5)
WBC: 7.1 10*3/uL (ref 4.0–10.5)
nRBC: 0 % (ref 0.0–0.2)
nRBC: 0 % (ref 0.0–0.2)

## 2018-12-09 LAB — GLUCOSE, CAPILLARY
Glucose-Capillary: 115 mg/dL — ABNORMAL HIGH (ref 70–99)
Glucose-Capillary: 155 mg/dL — ABNORMAL HIGH (ref 70–99)
Glucose-Capillary: 106 mg/dL — ABNORMAL HIGH (ref 70–99)
Glucose-Capillary: 108 mg/dL — ABNORMAL HIGH (ref 70–99)

## 2018-12-09 LAB — BASIC METABOLIC PANEL
Anion gap: 6 (ref 5–15)
BUN: 15 mg/dL (ref 8–23)
CO2: 23 mmol/L (ref 22–32)
Calcium: 8 mg/dL — ABNORMAL LOW (ref 8.9–10.3)
Chloride: 111 mmol/L (ref 98–111)
Creatinine, Ser: 0.84 mg/dL (ref 0.44–1.00)
GFR calc Af Amer: >60 mL/min (ref >60)
GFR calc non Af Amer: >60 mL/min (ref >60)
Glucose, Bld: 122 mg/dL — ABNORMAL HIGH (ref 70–99)
Potassium: 3.5 mmol/L (ref 3.5–5.1)
Sodium: 140 mmol/L (ref 135–145)

## 2018-12-09 LAB — HEMOGLOBIN AND HEMATOCRIT, BLOOD
HCT: 28.8 % — ABNORMAL LOW (ref 36.0–46.0)
Hemoglobin: 8.9 g/dL — ABNORMAL LOW (ref 12.0–15.0)

## 2018-12-09 LAB — MRSA PCR SCREENING: MRSA by PCR: NEGATIVE

## 2018-12-09 SURGERY — COLONOSCOPY
Anesthesia: Moderate Sedation

## 2018-12-09 MED ORDER — CHLORHEXIDINE GLUCONATE CLOTH 2 % EX PADS
6.0000 | MEDICATED_PAD | Freq: Every day | CUTANEOUS | Status: DC
  Administered 2018-12-09 – 2018-12-11 (×3): 6 via TOPICAL

## 2018-12-09 MED ORDER — HYDRALAZINE HCL 20 MG/ML IJ SOLN: 10.0000 mg | INTRAMUSCULAR | Status: DC | PRN

## 2018-12-09 MED ORDER — SODIUM CHLORIDE (PF) 0.9 % IJ SOLN
PREFILLED_SYRINGE | INTRAMUSCULAR | Status: DC | PRN
  Administered 2018-12-09: 10 mL

## 2018-12-09 MED ORDER — PANTOPRAZOLE SODIUM 40 MG IV SOLR
40.0000 mg | INTRAVENOUS | Status: DC
  Administered 2018-12-09 – 2018-12-10 (×2): 40 mg via INTRAVENOUS
  Filled 2018-12-09 (×2): qty 40

## 2018-12-09 MED ORDER — MEPERIDINE HCL 100 MG/ML IJ SOLN
INTRAMUSCULAR | Status: DC | PRN
  Administered 2018-12-09: 10 mg via INTRAVENOUS
  Administered 2018-12-09: 15 mg via INTRAVENOUS
  Administered 2018-12-09: 25 mg via INTRAVENOUS

## 2018-12-09 MED ORDER — ONDANSETRON HCL 4 MG/2ML IJ SOLN
INTRAMUSCULAR | Status: AC
  Filled 2018-12-09: qty 2

## 2018-12-09 MED ORDER — MIDAZOLAM HCL 5 MG/5ML IJ SOLN
INTRAMUSCULAR | Status: DC | PRN
  Administered 2018-12-09 (×2): 1 mg via INTRAVENOUS
  Administered 2018-12-09: 2 mg via INTRAVENOUS
  Administered 2018-12-09 (×2): 1 mg via INTRAVENOUS

## 2018-12-09 MED ORDER — SODIUM CHLORIDE 0.9% FLUSH
INTRAVENOUS | Status: AC
  Administered 2018-12-09: 3 mL
  Filled 2018-12-09: qty 10

## 2018-12-09 MED ORDER — MIDAZOLAM HCL 5 MG/5ML IJ SOLN
INTRAMUSCULAR | Status: AC
  Filled 2018-12-09: qty 5

## 2018-12-09 MED ORDER — PEG 3350-KCL-NA BICARB-NACL 420 G PO SOLR
4000.0000 mL | Freq: Once | ORAL | Status: AC
  Administered 2018-12-09: 4000 mL via ORAL

## 2018-12-09 MED ORDER — ONDANSETRON HCL 4 MG/2ML IJ SOLN
INTRAMUSCULAR | Status: DC | PRN
  Administered 2018-12-09: 4 mg via INTRAVENOUS

## 2018-12-09 MED ORDER — MEPERIDINE HCL 50 MG/ML IJ SOLN
INTRAMUSCULAR | Status: AC
  Filled 2018-12-09: qty 1

## 2018-12-09 MED ORDER — STERILE WATER FOR IRRIGATION IR SOLN
Status: DC | PRN
  Administered 2018-12-09: 2.5 mL

## 2018-12-09 MED ORDER — EPINEPHRINE 1 MG/10ML IJ SOSY
PREFILLED_SYRINGE | INTRAMUSCULAR | Status: AC
  Filled 2018-12-09: qty 10

## 2018-12-09 MED ORDER — SODIUM CHLORIDE 0.9% FLUSH
INTRAVENOUS | Status: AC
  Filled 2018-12-09: qty 10

## 2018-12-09 MED ORDER — MIDAZOLAM HCL 5 MG/5ML IJ SOLN
INTRAMUSCULAR | Status: AC
  Filled 2018-12-09: qty 10

## 2018-12-09 NOTE — Op Note (Addendum)
[facility] Patient Name: Melanie Welch Procedure Date: 12/09/2018 4:21 PM MRN: [id number] Date of Birth: [birth year] Attending MD: Norvel Richards , MD CSN: [id number] Age: 73 Admit Type: Inpatient Procedure:                Colonoscopy Indications:              Hematochezia Providers:                Norvel Richards, MD, Janeece Riggers, RN, Lurline Del, RN Referring MD:             Hospitalist Medicines:                Midazolam 6 mg IV, Meperidine 50 mg IV, Ondansetron                            4 mg IV Complications:            No immediate complications. Estimated Blood Loss:     Estimated blood loss was minimal. Procedure:                Pre-Anesthesia Assessment:                           - Prior to the procedure, a History and Physical                            was performed, and patient medications and                            allergies were reviewed. The patient's tolerance of                            previous anesthesia was also reviewed. The risks                            and benefits of the procedure and the sedation                            options and risks were discussed with the patient.                            All questions were answered, and informed consent                            was obtained. Prior Anticoagulants: The patient has                            taken no previous anticoagulant or antiplatelet                            agents. ASA Grade Assessment: II - A patient with  mild systemic disease. After reviewing the risks                            and benefits, the patient was deemed in                            satisfactory condition to undergo the procedure.                           After obtaining informed consent, the colonoscope                            was passed under direct vision. Throughout the                            procedure, the patient's blood pressure, pulse,  and                            oxygen saturations were monitored continuously. The                            CF-[id number] ([id number]) scope was introduced through                            the anus and advanced to the 5 cm into the ileum.                            The colonoscopy was performed without difficulty.                            The patient tolerated the procedure well. The                            quality of the bowel preparation was adequate. The                            terminal ileum, ileocecal valve, appendiceal                            orifice, and rectum were photographed. The entire                            colon was well visualized. Scope In: 5:05:36 PM Scope Out: 6:05:12 PM Scope Withdrawal Time: 0 hours 51 minutes 40 seconds  Total Procedure Duration: 0 hours 59 minutes 36 seconds  Findings:      The perianal and digital rectal examinations were normal.      Many small and large-mouthed diverticula were found in the entire colon.       Diluted fresh blood and clot throughout the left and transverse colon.       This finding tapered off toward the right colon and cecum. The terminal       ileum - distal 5 cm appeared normal. 1 cm yellowish submucosal nodule       hepatic flexure positive pillow sign  consistent with a lipoma. An       extended period of time was taken surveying the left colon quite a bit       of lavage and suctioning took place. Many, many of the tics throughout       the colon were occluded with stool. There was one large tic in the mid       sigmoid which was difficult to approach but appeared to have fresh blood       coming out of it. Image 1 This area was treated with 10 cc of 1-10,000       epinephrine injected submucosally. Because of lack of approach, I was       unable to augment the epinephrine with clips, etc.      A 5 mm polyp was found in the splenic flexure. The polyp was sessile.       The polyp was removed with a cold snare.  Resection and retrieval were       complete. Estimated blood loss was minimal.      The exam was otherwise without abnormality on direct and retroflexion       views. Impression:               - Diverticulosis in the entire examined colon.                            Suspect diverticulum in the mid sigmoid treated as                            described above.                           - One 5 mm polyp at the splenic flexure, removed                            with a cold snare. Resected and retrieved. Colonic                            lipoma. Normal terminal ileum. Many diverticula                            occluded with stool.                           - The examination was otherwise normal on direct                            and retroflexion views. Moderate Sedation:      Moderate (conscious) sedation was personally administered by an       anesthesia professional. The following parameters were monitored: oxygen       saturation, heart rate, blood pressure, respiratory rate, EKG, adequacy       of pulmonary ventilation, and response to care. Total physician       intraservice time was 63 minutes. Recommendation:           - Patient has a contact number available for  emergencies. The signs and symptoms of potential                            delayed complications were discussed with the                            patient. Return to normal activities tomorrow.                            Written discharge instructions were provided to the                            patient.                           - Clear liquid diet today.                           - Continue present medications.                           - Return patient to hospital ward for observation.                           - No repeat colonoscopy due to age.                           - Return to GI clinic (date not yet determined).                            Avoid ASA/NSAIDs. Once taking a regular  diet begin                            Benefiber 1 tablespoon daily for 3 weeks; then                            increase to 1 tablespoon twice daily thereafter.                            Follow-up on pathology. At patient's request I                            spoke to the patient's daughter, Velva Harman, at                            [phone] Procedure Code(s):        --- Professional ---                           [phone], Colonoscopy, flexible; with removal of                            tumor(s), polyp(s), or other lesion(s) by snare  technique Diagnosis Code(s):        --- Professional ---                           K63.5, Polyp of colon                           K92.1, Melena (includes Hematochezia)                           K57.30, Diverticulosis of large intestine without                            perforation or abscess without bleeding CPT copyright 2019 [employer]. All rights reserved. The codes documented in this report are preliminary and upon coder review may  be revised to meet current compliance requirements. Cristopher Estimable. Rourk, MD Norvel Richards, MD 12/09/2018 6:17:37 PM This report has been signed electronically. Number of Addenda: 0

## 2018-12-09 NOTE — H&P (Signed)
Patient seen in the endoscopy unit.  Remained stable.  Bleeding may be tapering off.  Noon hemoglobin 8.9. Further recommendations after colonoscopy has been completed.

## 2018-12-09 NOTE — Consult Note (Signed)
Referring Provider: No ref. provider found Primary Care Physician:  [name], MD Primary Gastroenterologist:  Dr. Oneida Alar  Reason for Consultation:  hematochezia  HPI: Melanie 73 year old Welch presented to the ED yesterday with acute onset lower abdominal cramps and gross blood/clot per rectum.  She was evaluated by Dr. Regenia Skeeter in the ED.  She was found to be hemodynamically stable with a hemoglobin of 11.8   CT was positive for diverticulosis but no evidence of acute inflammation anywhere.  We briefly discussed the case over the telephone felt this Welch could be managed as an outpatient with close follow-up.  She went home and had recurrent lower abdominal cramps and another episode of gross clot/blood per rectum.  She presented back to the emergency department. She was admitted for further evaluation.  Has remained hemodynamically stable. Both nursing staff and patient report no further abdominal pain or any BM since admission.  However, her hemoglobin has plummeted to 7.7 with hematocrit of 25.5.  Again, she denies abdominal pain this morning.  She has been ambulating in her room (ICU 11).  She is hungry.  She denies melena.  She denies any upper GI tract symptoms including odynophagia, dysphagia, early satiety nausea or vomiting.  No history of GI illness other than being noted to have diverticulosis on 2012 colonoscopy (Dr. Oneida Alar).  She denies NSAIDs and anticoagulation therapy.  She does not smoke.  Past Medical History:  Diagnosis Date   Diabetes mellitus    Hypertension    MGUS (monoclonal gammopathy of unknown significance)    Proteinuria     Past Surgical History:  Procedure Laterality Date   ABDOMINAL HYSTERECTOMY     BREAST BIOPSY  2005   left   CATARACT EXTRACTION W/PHACO Right 05/10/2017   Procedure: CATARACT EXTRACTION PHACO AND INTRAOCULAR LENS PLACEMENT (IOC);  Surgeon: Rutherford Guys, MD;  Location: [facility];  Service: Ophthalmology;  Laterality: Right;   CDE: 5.04   CATARACT EXTRACTION W/PHACO Left 05/24/2017   Procedure: CATARACT EXTRACTION PHACO AND INTRAOCULAR LENS PLACEMENT (IOC);  Surgeon: Rutherford Guys, MD;  Location: [facility];  Service: Ophthalmology;  Laterality: Left;  CDE: 8.40   COLONOSCOPY  07/13/2011   Procedure: COLONOSCOPY;  Surgeon: Dorothyann Peng, MD;  Location: [facility];  Service: Endoscopy;  Laterality: N/A;  9:30 AM    Prior to Admission medications   Medication Sig Start Date End Date Taking? Authorizing Provider  amLODipine (NORVASC) 10 MG tablet Take 10 mg by mouth at bedtime.    Yes [provider]  aspirin EC 81 MG tablet Take 81 mg by mouth daily.   Yes [provider]  losartan (COZAAR) 100 MG tablet Take 100 mg by mouth daily.   Yes [provider]  metFORMIN (GLUCOPHAGE) 1000 MG tablet Take 1,000 mg by mouth 2 (two) times daily. 03/11/17  Yes [provider]  Polyethyl Glycol-Propyl Glycol (SYSTANE) 0.4-0.3 % SOLN Place 1 drop into both eyes 2 (two) times daily as needed. IN THE MORNING & NOON.    Yes [provider]  Probiotic Product (CULTURELLE PROBIOTICS PO) Take 1 capsule by mouth daily.   Yes [provider]    Current Facility-Administered Medications  Medication Dose Route Frequency Provider Last Rate Last Dose   Chlorhexidine Gluconate Cloth 2 % PADS 6 each  6 each Topical Q0600 Manuella Ghazi, Pratik D, DO       hydrALAZINE (APRESOLINE) injection 10 mg  10 mg Intravenous Q4H PRN Manuella Ghazi, Pratik D, DO  insulin aspart (novoLOG) injection 0-15 Units  0-15 Units Subcutaneous TID WC Truett Mainland, DO       ondansetron [facility]) tablet 4 mg  4 mg Oral Q6H PRN Truett Mainland, DO       Or   ondansetron [facility]) injection 4 mg  4 mg Intravenous Q6H PRN Truett Mainland, DO       pantoprazole (PROTONIX) injection 40 mg  40 mg Intravenous Q24H Manuella Ghazi, Pratik D, DO   40 mg at 12/09/18 4081    Allergies as of 12/08/2018   (No Known Allergies)     Family History  Problem Relation Age of Onset   Cancer Mother    Cancer Sister     Social history.  Divorced.  Retired.  Non-smoker.  Occasional glass of red wine.  2 grown children that live in [place].  College-educated bank /  school auditor.  Entrepreneur.  Previously worked for the [employer] and [employer]  Review of Systems: As in history of present illness   Physical Exam: Vital signs in last 24 hours: Temp:  [98.1 F (36.7 C)-99.4 F (37.4 C)] 98.5 F (36.9 C) (05/09 0749) Pulse Rate:  [71-100] 86 (05/09 0900) Resp:  [0-23] 16 (05/09 0900) BP: (109-154)/(59-107) 126/78 (05/09 0900) SpO2:  [91 %-100 %] 100 % (05/09 0900) Weight:  [74.8 kg-76.4 kg] 76.4 kg (05/09 0406) Last BM Date: 12/08/18 General:   Alert,  Well-developed, well-nourished, Melanie and cooperative in NAD Head:  Normocephalic and atraumatic. Eyes:  Sclera clear, no icterus.   Conjunctiva pink. Ears:  Normal auditory acuity. Lungs:  Clear throughout to auscultation.   No wheezes, crackles, or rhonchi. No acute distress. Heart:  Regular rate and rhythm; no murmurs, clicks, rubs,  or gallops. Abdomen:  Soft, nontender and nondistended. No masses, hepatosplenomegaly or hernias noted. Normal bowel sounds, without guarding, and without rebound.   .  Intake/Output from previous day: 05/08 0701 - 05/09 0700 In: 1240 [P.O.:240; IV Piggyback:1000] Out: 1100 [Urine:1100] Intake/Output this shift: No intake/output data recorded.  Lab Results: Recent Labs    12/08/18 1851 12/08/18 2328 12/09/18 0529  WBC 6.7 7.5 7.1  HGB 9.0* 8.4* 7.7*  HCT 29.8* 26.7* 25.5*  PLT 193 174 177   BMET Recent Labs    12/08/18 1245 12/09/18 0529  NA 136 140  K 4.4 3.5  CL 104 111  CO2 23 23  GLUCOSE 170* 122*  BUN 22 15  CREATININE 1.05* 0.84  CALCIUM 8.3* 8.0*   LFT Recent Labs    12/08/18 1245  PROT 7.0  ALBUMIN 3.5  AST 27  ALT 20  ALKPHOS 40  BILITOT 0.9   PT/INR No results for input(s): LABPROT,  INR in the last 72 hours. Hepatitis Panel No results for input(s): HEPBSAG, HCVAB, HEPAIGM, HEPBIGM in the last 72 hours. C-Diff No results for input(s): CDIFFTOX in the last 72 hours.  Studies/Results: Ct Abdomen Pelvis W Contrast  Result Date: 12/08/2018 CLINICAL DATA:  Abdominal pain.  Bloody stool.  Near syncope. EXAM: CT ABDOMEN AND PELVIS WITH CONTRAST TECHNIQUE: Multidetector CT imaging of the abdomen and pelvis was performed using the standard protocol following bolus administration of intravenous contrast. CONTRAST:  77mL OMNIPAQUE IOHEXOL 300 MG/ML  SOLN COMPARISON:  None. FINDINGS: Lower chest: Normal. Hepatobiliary: Slight hepatic steatosis. No focal lesions. Biliary tree is normal. Pancreas: Unremarkable. No pancreatic ductal dilatation or surrounding inflammatory changes. Spleen: Normal in size without focal abnormality. Adrenals/Urinary Tract: There is a 2.5 cm slightly complex exophytic cyst  on the lower pole of the left kidney. There is an area of slightly increased density at the lateral aspect of this cyst. The kidneys are otherwise normal. No hydronephrosis. Bladder is normal. Adrenal glands are normal. Stomach/Bowel: There is extensive diverticulosis throughout the entire colon without evidence of diverticulitis. Moderate amount of stool in the rectum. Appendix is normal. Small bowel and stomach are normal. Vascular/Lymphatic: Minimal aortic atherosclerosis.  No adenopathy. Reproductive: Status post hysterectomy. No adnexal masses. Other: No abdominal wall hernia or abnormality. No abdominopelvic ascites. Musculoskeletal: No acute abnormality. 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 IMPRESSION: 1. Extensive diverticulosis of the entire colon. No evidence of acute diverticulitis. 2. Complex cystic lesion on the lower pole of the left kidney. I recommend MRI of the kidneys with and without contrast for further characterization. Electronically Signed   By: Lorriane Shire  M.D.   On: 12/08/2018 14:22    Impression: Melanie Welch presents to the hospital with acute onset abdominal cramping and gross blood clots per rectum.  Clinically, this sounds like a segmental or ischemic colitis. However, it is notable there was no bowel wall thickening or inflammation on CT scan from yesterday.  In addition, she has had about a 4 unit drop in her hemoglobin which is a little atypical for self-limiting ischemic or segmental colitis. Conversely, diverticular bleeding can be a multiunit phenomenon but abdominal pain would be unusual.  I do suspect that her recent bleeding is emanating from her lower GI tract. She looks really good this morning.  Recommendations: In this setting, feel we ought to go ahead and determine the cause of her bleeding more urgently.  I have offered her a diagnostic colonoscopy later today. The risks, benefits, limitations, alternatives and imponderables have been reviewed with the patient. Questions have been answered. All parties are agreeable.  Agree with following H&H closely. Continue clear liquid diet and start a colon purge. Plan for diagnostic colonoscopy around 1700 today. Agree with the prior recommendations of working up renal lesion with MRI (per attending).   Further recommendations to follow.       Notice:  This dictation was prepared with Dragon dictation along with smaller phrase technology. Any transcriptional errors that result from this process are unintentional and may not be corrected upon review.

## 2018-12-09 NOTE — Progress Notes (Signed)
PROGRESS NOTE    Melanie Welch  TGY:[id number] DOB: [birth year] DOA: 12/08/2018 PCP: [name], MD   Brief Narrative:  Per HPI: Melanie Welch is a 73 y.o. female with a history of diabetes, hypertension, MGUS.  Patient seen earlier today for lower quadrant abdominal pain.  Patient had a CT scan which showed diverticulosis without infection and was sent home after pain had improved.  Patient had episode of bloody stool with large blood clot.  She immediately felt weak and lightheaded which gradually improved.  She came back to the hospital for evaluation.  No palliating or provoking factors.  Had maroon bloody stool on rectal exam.  No current abdominal pain.  No lightheadedness, dizziness.  Patient has been admitted with lower GI bleed with suspicion of diverticular bleed. Her hemoglobin is currently downtrending, but she is asymptomatic.  Assessment & Plan:   Active Problems:   Palpable thyroid   Diabetes mellitus without complication (HCC)   Essential hypertension, benign   Lower GI bleed   1. Rectal bleed suspect diverticular.  Appreciate GI evaluation for possible colonoscopy with prior performed in 2012 with noted internal hemorrhoids and pancolonic diverticulosis at that time.  No prior noted upper endoscopy.  Start Protonix IV daily for now. 2. Acute blood loss anemia secondary to above.  She appears to be mildly symptomatic and hemoglobin has down trended from 10.2 on admission to 7.7.  We will plan to transfuse for hemoglobin less than 7.  Maintain on SCDs and monitor H&H every 6 hours. 3. Hypertension.  Currently adequate.  Will hold home blood pressure agents for now given some symptomatic anemia. 4. Type 2 diabetes.  Holding home metformin.  Blood glucose levels currently stable.  Maintaining on clear liquids for now with GI to evaluate. 5. Palpable thyroid.  This has been evaluated in the outpatient setting by endocrinology and she is status post biopsy.  No further work-up  indicated.   DVT prophylaxis: SCDs Code Status: Full Family Communication: None at bedside Disposition Plan: GI evaluation and monitoring of CBC.  Return to home once stabilized.   Consultants:   GI  Procedures:   None  Antimicrobials:   None   Subjective: Patient seen and evaluated today with no new acute complaints or concerns. No acute concerns or events noted overnight.  She has not had any further bowel movements while here and vital signs remained stable.  Objective: Vitals:   12/09/18 0500 12/09/18 0600 12/09/18 0700 12/09/18 0749  BP: 125/76 110/65 (!) 111/59   Pulse: 76 74 79 100  Resp:      Temp:    98.5 F (36.9 C)  TempSrc:    Oral  SpO2: 98% 97% 98% 99%  Weight:      Height:        Intake/Output Summary (Last 24 hours) at 12/09/2018 0807 Last data filed at 12/09/2018 0506 Gross per 24 hour  Intake 1240 ml  Output 1100 ml  Net 140 ml   Filed Weights   12/08/18 1837 12/09/18 0000 12/09/18 0406  Weight: 74.8 kg 75.6 kg 76.4 kg    Examination:  General exam: Appears calm and comfortable  Respiratory system: Clear to auscultation. Respiratory effort normal. Cardiovascular system: S1 & S2 heard, RRR. No JVD, murmurs, rubs, gallops or clicks. No pedal edema. Gastrointestinal system: Abdomen is nondistended, soft and nontender. No organomegaly or masses felt. Normal bowel sounds heard. Central nervous system: Alert and oriented. No focal neurological deficits. Extremities: Symmetric 5 x 5  power. Skin: No rashes, lesions or ulcers Psychiatry: Judgement and insight appear normal. Mood & affect appropriate.     Data Reviewed: I have personally reviewed following labs and imaging studies  CBC: Recent Labs  Lab 12/08/18 1245 12/08/18 1851 12/08/18 2328 12/09/18 0529  WBC 6.0 6.7 7.5 7.1  NEUTROABS 3.6  --   --   --   HGB 10.2* 9.0* 8.4* 7.7*  HCT 33.2* 29.8* 26.7* 25.5*  MCV 88.1 89.2 87.5 87.3  PLT 192 193 174 976   Basic Metabolic  Panel: Recent Labs  Lab 12/08/18 1245 12/09/18 0529  NA 136 140  K 4.4 3.5  CL 104 111  CO2 23 23  GLUCOSE 170* 122*  BUN 22 15  CREATININE 1.05* 0.84  CALCIUM 8.3* 8.0*   GFR: Estimated Creatinine Clearance: 61.1 mL/min (by C-G formula based on SCr of 0.84 mg/dL). Liver Function Tests: Recent Labs  Lab 12/08/18 1245  AST 27  ALT 20  ALKPHOS 40  BILITOT 0.9  PROT 7.0  ALBUMIN 3.5   Recent Labs  Lab 12/08/18 1245  LIPASE 44   No results for input(s): AMMONIA in the last 168 hours. Coagulation Profile: No results for input(s): INR, PROTIME in the last 168 hours. Cardiac Enzymes: No results for input(s): CKTOTAL, CKMB, CKMBINDEX, TROPONINI in the last 168 hours. BNP (last 3 results) No results for input(s): PROBNP in the last 8760 hours. HbA1C: No results for input(s): HGBA1C in the last 72 hours. CBG: Recent Labs  Lab 12/08/18 2325 12/09/18 0744  GLUCAP 124* 115*   Lipid Profile: No results for input(s): CHOL, HDL, LDLCALC, TRIG, CHOLHDL, LDLDIRECT in the last 72 hours. Thyroid Function Tests: No results for input(s): TSH, T4TOTAL, FREET4, T3FREE, THYROIDAB in the last 72 hours. Anemia Panel: No results for input(s): VITAMINB12, FOLATE, FERRITIN, TIBC, IRON, RETICCTPCT in the last 72 hours. Sepsis Labs: Recent Labs  Lab 12/08/18 1851 12/08/18 2036  LATICACIDVEN 2.9* 1.8    Recent Results (from the past 240 hour(s))  SARS Coronavirus 2 (CEPHEID - Performed in [facility] hospital lab), Hosp Order     Status: None   Collection Time: 12/08/18  7:50 PM  Result Value Ref Range Status   SARS Coronavirus 2 NEGATIVE NEGATIVE Final    Comment: (NOTE) If result is NEGATIVE SARS-CoV-2 target nucleic acids are NOT DETECTED. The SARS-CoV-2 RNA is generally detectable in upper and lower  respiratory specimens during the acute phase of infection. The lowest  concentration of SARS-CoV-2 viral copies this assay can detect is 250  copies / mL. A negative result  does not preclude SARS-CoV-2 infection  and should not be used as the sole basis for treatment or other  patient management decisions.  A negative result may occur with  improper specimen collection / handling, submission of specimen other  than nasopharyngeal swab, presence of viral mutation(s) within the  areas targeted by this assay, and inadequate number of viral copies  (<250 copies / mL). A negative result must be combined with clinical  observations, patient history, and epidemiological information. If result is POSITIVE SARS-CoV-2 target nucleic acids are DETECTED. The SARS-CoV-2 RNA is generally detectable in upper and lower  respiratory specimens dur ing the acute phase of infection.  Positive  results are indicative of active infection with SARS-CoV-2.  Clinical  correlation with patient history and other diagnostic information is  necessary to determine patient infection status.  Positive results do  not rule out bacterial infection or co-infection with other viruses.  If result is PRESUMPTIVE POSTIVE SARS-CoV-2 nucleic acids MAY BE PRESENT.   A presumptive positive result was obtained on the submitted specimen  and confirmed on repeat testing.  While 2019 novel coronavirus  (SARS-CoV-2) nucleic acids may be present in the submitted sample  additional confirmatory testing may be necessary for epidemiological  and / or clinical management purposes  to differentiate between  SARS-CoV-2 and other Sarbecovirus currently known to infect humans.  If clinically indicated additional testing with an alternate test  methodology [phone]) is advised. The SARS-CoV-2 RNA is generally  detectable in upper and lower respiratory sp ecimens during the acute  phase of infection. The expected result is Negative. Fact Sheet for Patients:  StrictlyIdeas.no Fact Sheet for Healthcare Providers: BankingDealers.co.za This test is not yet approved or  cleared by the [place] FDA and has been authorized for detection and/or diagnosis of SARS-CoV-2 by FDA under an Emergency Use Authorization (EUA).  This EUA will remain in effect (meaning this test can be used) for the duration of the COVID-19 declaration under Section 564(b)(1) of the Act, 21 U.S.C. section 360bbb-3(b)(1), unless the authorization is terminated or revoked sooner. Performed at [facility], [address], [place], [place] [address]   MRSA PCR Screening     Status: None   Collection Time: 12/08/18 10:49 PM  Result Value Ref Range Status   MRSA by PCR NEGATIVE NEGATIVE Final    Comment:        The GeneXpert MRSA Assay (FDA approved for NASAL specimens only), is one component of a comprehensive MRSA colonization surveillance program. It is not intended to diagnose MRSA infection nor to guide or monitor treatment for MRSA infections. Performed at [facility], [address] [place], [place] [address]          Radiology Studies: Ct Abdomen Pelvis W Contrast  Result Date: 12/08/2018 CLINICAL DATA:  Abdominal pain.  Bloody stool.  Near syncope. EXAM: CT ABDOMEN AND PELVIS WITH CONTRAST TECHNIQUE: Multidetector CT imaging of the abdomen and pelvis was performed using the standard protocol following bolus administration of intravenous contrast. CONTRAST:  36mL OMNIPAQUE IOHEXOL 300 MG/ML  SOLN COMPARISON:  None. FINDINGS: Lower chest: Normal. Hepatobiliary: Slight hepatic steatosis. No focal lesions. Biliary tree is normal. Pancreas: Unremarkable. No pancreatic ductal dilatation or surrounding inflammatory changes. Spleen: Normal in size without focal abnormality. Adrenals/Urinary Tract: There is a 2.5 cm slightly complex exophytic cyst on the lower pole of the left kidney. There is an area of slightly increased density at the lateral aspect of this cyst. The kidneys are otherwise normal. No hydronephrosis. Bladder is normal. Adrenal glands are normal. Stomach/Bowel:  There is extensive diverticulosis throughout the entire colon without evidence of diverticulitis. Moderate amount of stool in the rectum. Appendix is normal. Small bowel and stomach are normal. Vascular/Lymphatic: Minimal aortic atherosclerosis.  No adenopathy. Reproductive: Status post hysterectomy. No adnexal masses. Other: No abdominal wall hernia or abnormality. No abdominopelvic ascites. Musculoskeletal: No acute abnormality. 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 IMPRESSION: 1. Extensive diverticulosis of the entire colon. No evidence of acute diverticulitis. 2. Complex cystic lesion on the lower pole of the left kidney. I recommend MRI of the kidneys with and without contrast for further characterization. Electronically Signed   By: Lorriane Shire M.D.   On: 12/08/2018 14:22        Scheduled Meds:  amLODipine  10 mg Oral QHS   insulin aspart  0-15 Units Subcutaneous TID WC   losartan  100  mg Oral Daily   Continuous Infusions:   LOS: 0 days    Time spent: 30 minutes    Tkeyah Burkman Darleen Crocker, DO [facility] Pager [phone]  If 7PM-7AM, please contact night-coverage [email or web address] Password [email or web address] 12/09/2018, 8:07 AM

## 2018-12-09 NOTE — Progress Notes (Signed)
Received post colonoscopy. Stable. Drowsiness and easily aroused. In bed. Side rails up x4 for safety. Bed in low position. Bed alarm on.Callbell in reach. Instructed not to get OOB without assistance tonight- related to sedation meds given during procedure. Verbalized understanding. See flowsheet for VSs and assessment.

## 2018-12-10 DIAGNOSIS — E119 Type 2 diabetes mellitus without complications: Secondary | ICD-10-CM | POA: Diagnosis present

## 2018-12-10 DIAGNOSIS — D472 Monoclonal gammopathy: Secondary | ICD-10-CM | POA: Diagnosis present

## 2018-12-10 DIAGNOSIS — K5731 Diverticulosis of large intestine without perforation or abscess with bleeding: Secondary | ICD-10-CM | POA: Diagnosis present

## 2018-12-10 DIAGNOSIS — K922 Gastrointestinal hemorrhage, unspecified: Secondary | ICD-10-CM | POA: Diagnosis present

## 2018-12-10 DIAGNOSIS — Z809 Family history of malignant neoplasm, unspecified: Secondary | ICD-10-CM | POA: Diagnosis not present

## 2018-12-10 DIAGNOSIS — K635 Polyp of colon: Secondary | ICD-10-CM | POA: Diagnosis present

## 2018-12-10 DIAGNOSIS — Z87891 Personal history of nicotine dependence: Secondary | ICD-10-CM | POA: Diagnosis not present

## 2018-12-10 DIAGNOSIS — Z79899 Other long term (current) drug therapy: Secondary | ICD-10-CM | POA: Diagnosis not present

## 2018-12-10 DIAGNOSIS — Z7984 Long term (current) use of oral hypoglycemic drugs: Secondary | ICD-10-CM | POA: Diagnosis not present

## 2018-12-10 DIAGNOSIS — Z20828 Contact with and (suspected) exposure to other viral communicable diseases: Secondary | ICD-10-CM | POA: Diagnosis present

## 2018-12-10 DIAGNOSIS — Z90710 Acquired absence of both cervix and uterus: Secondary | ICD-10-CM | POA: Diagnosis not present

## 2018-12-10 DIAGNOSIS — E876 Hypokalemia: Secondary | ICD-10-CM | POA: Diagnosis present

## 2018-12-10 DIAGNOSIS — D62 Acute posthemorrhagic anemia: Secondary | ICD-10-CM | POA: Diagnosis present

## 2018-12-10 DIAGNOSIS — I10 Essential (primary) hypertension: Secondary | ICD-10-CM | POA: Diagnosis present

## 2018-12-10 DIAGNOSIS — Z7982 Long term (current) use of aspirin: Secondary | ICD-10-CM | POA: Diagnosis not present

## 2018-12-10 DIAGNOSIS — K921 Melena: Secondary | ICD-10-CM | POA: Diagnosis present

## 2018-12-10 DIAGNOSIS — K5791 Diverticulosis of intestine, part unspecified, without perforation or abscess with bleeding: Secondary | ICD-10-CM | POA: Diagnosis not present

## 2018-12-10 DIAGNOSIS — K5730 Diverticulosis of large intestine without perforation or abscess without bleeding: Secondary | ICD-10-CM | POA: Diagnosis present

## 2018-12-10 LAB — CBC
HCT: 23.2 % — ABNORMAL LOW (ref 36.0–46.0)
Hemoglobin: 7.2 g/dL — ABNORMAL LOW (ref 12.0–15.0)
MCH: 27 pg (ref 26.0–34.0)
MCHC: 31 g/dL (ref 30.0–36.0)
MCV: 86.9 fL (ref 80.0–100.0)
Platelets: 169 10*3/uL (ref 150–400)
RBC: 2.67 MIL/uL — ABNORMAL LOW (ref 3.87–5.11)
RDW: 13.7 % (ref 11.5–15.5)
WBC: 7.9 10*3/uL (ref 4.0–10.5)
nRBC: 0 % (ref 0.0–0.2)

## 2018-12-10 LAB — PREPARE RBC (CROSSMATCH)

## 2018-12-10 LAB — BASIC METABOLIC PANEL
Anion gap: 7 (ref 5–15)
BUN: 9 mg/dL (ref 8–23)
CO2: 24 mmol/L (ref 22–32)
Calcium: 7.8 mg/dL — ABNORMAL LOW (ref 8.9–10.3)
Chloride: 111 mmol/L (ref 98–111)
Creatinine, Ser: 0.97 mg/dL (ref 0.44–1.00)
GFR calc Af Amer: >60 mL/min (ref >60)
GFR calc non Af Amer: 58 mL/min — ABNORMAL LOW (ref >60)
Glucose, Bld: 106 mg/dL — ABNORMAL HIGH (ref 70–99)
Potassium: 3.4 mmol/L — ABNORMAL LOW (ref 3.5–5.1)
Sodium: 142 mmol/L (ref 135–145)

## 2018-12-10 LAB — GLUCOSE, CAPILLARY
Glucose-Capillary: 201 mg/dL — ABNORMAL HIGH (ref 70–99)
Glucose-Capillary: 123 mg/dL — ABNORMAL HIGH (ref 70–99)
Glucose-Capillary: 100 mg/dL — ABNORMAL HIGH (ref 70–99)
Glucose-Capillary: 138 mg/dL — ABNORMAL HIGH (ref 70–99)

## 2018-12-10 LAB — HEMOGLOBIN AND HEMATOCRIT, BLOOD
HCT: 25.9 % — ABNORMAL LOW (ref 36.0–46.0)
Hemoglobin: 8 g/dL — ABNORMAL LOW (ref 12.0–15.0)

## 2018-12-10 LAB — ABO/RH: ABO/RH(D): O POS

## 2018-12-10 MED ORDER — POTASSIUM CHLORIDE CRYS ER 20 MEQ PO TBCR
40.0000 meq | EXTENDED_RELEASE_TABLET | Freq: Once | ORAL | Status: AC
  Administered 2018-12-10: 40 meq via ORAL
  Filled 2018-12-10: qty 2

## 2018-12-10 MED ORDER — PANTOPRAZOLE SODIUM 40 MG PO TBEC
40.0000 mg | DELAYED_RELEASE_TABLET | Freq: Every day | ORAL | Status: DC
  Administered 2018-12-11: 40 mg via ORAL
  Filled 2018-12-10: qty 1

## 2018-12-10 MED ORDER — SODIUM CHLORIDE 0.9% IV SOLUTION
Freq: Once | INTRAVENOUS | Status: AC
  Administered 2018-12-10: 12:00:00 via INTRAVENOUS

## 2018-12-10 NOTE — Progress Notes (Addendum)
PROGRESS NOTE    Melanie Welch  YIR:[id number] DOB: 1946/03/23 DOA: 12/08/2018 PCP: [name], MD   Brief Narrative:  Per HPI: Melanie Welch a 73 y.o.femalewith a history of diabetes, hypertension, MGUS. Patient seen earlier today for lower quadrant abdominal pain. Patient had a CT scan which showed diverticulosis without infection and was sent home after pain had improved. Patient had episode of bloody stool with large blood clot. She immediately felt weak and lightheaded which gradually improved. She came back to the hospital for evaluation. No palliating or provoking factors. Had maroon bloody stool on rectal exam. No current abdominal pain. No lightheadedness, dizziness.  Patient has been admitted with lower GI bleed with suspicion of diverticular bleed. Her hemoglobin is currently downtrending, but she is asymptomatic.  She has undergone colonoscopy on 5/9 with findings of diverticulosis with no active bleeding.  She has had no further bloody bowel movements noted.  Her hemoglobin remains quite low at 7.2 this a.m.   Assessment & Plan:   Active Problems:   Palpable thyroid   Diabetes mellitus without complication (HCC)   Essential hypertension, benign   Lower GI bleed  1. Rectal bleed suspect diverticular.  Appreciate GI evaluation with colonoscopy on 5/9 with diverticulosis noted and no active bleed.  Further plans per GI.  Protonix to oral. 2. Acute blood loss anemia secondary to above.  This appears to be progressing down to 7.2 this a.m.  We will plan to transfuse 1 unit PRBC today and repeat posttransfusion H&H as well as repeat CBC in a.m.  If stable, will consider discharge at that time. 3. Hypertension.  Currently adequate.  Will hold home blood pressure agents for now given some symptomatic anemia.  Hydralazine as needed. 4. Type 2 diabetes.  Holding home metformin.  Blood glucose levels currently stable.    Advance to soft diet. 5. Palpable thyroid.  This has  been evaluated in the outpatient setting by endocrinology and she is status post biopsy.  No further work-up indicated. 6. Mild hypokalemia.  Replete and recheck labs in a.m.   DVT prophylaxis: SCDs Code Status: Full Family Communication:  Will plan to call daughter Melanie Welch Disposition Plan:  Appreciate further GI evaluation and diet advancement to soft today.  Plan to transfuse 1 unit PRBC today and repeat CBC in a.m.  If stable will consider discharge at that time.   Consultants:   GI  Procedures:   None  Antimicrobials:   None   Subjective: Patient seen and evaluated today with no new acute complaints or concerns. No acute concerns or events noted overnight.  Objective: Vitals:   12/10/18 0600 12/10/18 0700 12/10/18 0728 12/10/18 0800  BP: 125/75 118/66    Pulse: (!) 111 88 86 81  Resp:  15 15 20   Temp:   98.9 F (37.2 C)   TempSrc:   Oral   SpO2: 99% 100% 100%   Weight:      Height:        Intake/Output Summary (Last 24 hours) at 12/10/2018 0805 Last data filed at 12/10/2018 0600 Gross per 24 hour  Intake 483 ml  Output 1100 ml  Net -617 ml   Filed Weights   12/09/18 0000 12/09/18 0406 12/10/18 0500  Weight: 75.6 kg 76.4 kg 74.6 kg    Examination:  General exam: Appears calm and comfortable  Respiratory system: Clear to auscultation. Respiratory effort normal. Cardiovascular system: S1 & S2 heard, RRR. No JVD, murmurs, rubs, gallops or clicks. No pedal  edema. Gastrointestinal system: Abdomen is nondistended, soft and nontender. No organomegaly or masses felt. Normal bowel sounds heard. Central nervous system: Alert and oriented. No focal neurological deficits. Extremities: Symmetric 5 x 5 power. Skin: No rashes, lesions or ulcers Psychiatry: Judgement and insight appear normal. Mood & affect appropriate.     Data Reviewed: I have personally reviewed following labs and imaging studies  CBC: Recent Labs  Lab 12/08/18 1245 12/08/18 1851  12/08/18 2328 12/09/18 0529 12/09/18 1233 12/10/18 0530  WBC 6.0 6.7 7.5 7.1  --  7.9  NEUTROABS 3.6  --   --   --   --   --   HGB 10.2* 9.0* 8.4* 7.7* 8.9* 7.2*  HCT 33.2* 29.8* 26.7* 25.5* 28.8* 23.2*  MCV 88.1 89.2 87.5 87.3  --  86.9  PLT 192 193 174 177  --  053   Basic Metabolic Panel: Recent Labs  Lab 12/08/18 1245 12/09/18 0529 12/10/18 0530  NA 136 140 142  K 4.4 3.5 3.4*  CL 104 111 111  CO2 23 23 24   GLUCOSE 170* 122* 106*  BUN 22 15 9   CREATININE 1.05* 0.84 0.97  CALCIUM 8.3* 8.0* 7.8*   GFR: Estimated Creatinine Clearance: 52.9 mL/min (by C-G formula based on SCr of 0.97 mg/dL). Liver Function Tests: Recent Labs  Lab 12/08/18 1245  AST 27  ALT 20  ALKPHOS 40  BILITOT 0.9  PROT 7.0  ALBUMIN 3.5   Recent Labs  Lab 12/08/18 1245  LIPASE 44   No results for input(s): AMMONIA in the last 168 hours. Coagulation Profile: No results for input(s): INR, PROTIME in the last 168 hours. Cardiac Enzymes: No results for input(s): CKTOTAL, CKMB, CKMBINDEX, TROPONINI in the last 168 hours. BNP (last 3 results) No results for input(s): PROBNP in the last 8760 hours. HbA1C: No results for input(s): HGBA1C in the last 72 hours. CBG: Recent Labs  Lab 12/09/18 0744 12/09/18 1128 12/09/18 1625 12/09/18 2122 12/10/18 0729  GLUCAP 115* 155* 106* 108* 201*   Lipid Profile: No results for input(s): CHOL, HDL, LDLCALC, TRIG, CHOLHDL, LDLDIRECT in the last 72 hours. Thyroid Function Tests: No results for input(s): TSH, T4TOTAL, FREET4, T3FREE, THYROIDAB in the last 72 hours. Anemia Panel: No results for input(s): VITAMINB12, FOLATE, FERRITIN, TIBC, IRON, RETICCTPCT in the last 72 hours. Sepsis Labs: Recent Labs  Lab 12/08/18 1851 12/08/18 2036  LATICACIDVEN 2.9* 1.8    Recent Results (from the past 240 hour(s))  SARS Coronavirus 2 (CEPHEID - Performed in [facility] hospital lab), Hosp Order     Status: None   Collection Time: 12/08/18  7:50 PM  Result  Value Ref Range Status   SARS Coronavirus 2 NEGATIVE NEGATIVE Final    Comment: (NOTE) If result is NEGATIVE SARS-CoV-2 target nucleic acids are NOT DETECTED. The SARS-CoV-2 RNA is generally detectable in upper and lower  respiratory specimens during the acute phase of infection. The lowest  concentration of SARS-CoV-2 viral copies this assay can detect is 250  copies / mL. A negative result does not preclude SARS-CoV-2 infection  and should not be used as the sole basis for treatment or other  patient management decisions.  A negative result may occur with  improper specimen collection / handling, submission of specimen other  than nasopharyngeal swab, presence of viral mutation(s) within the  areas targeted by this assay, and inadequate number of viral copies  (<250 copies / mL). A negative result must be combined with clinical  observations, patient history,  and epidemiological information. If result is POSITIVE SARS-CoV-2 target nucleic acids are DETECTED. The SARS-CoV-2 RNA is generally detectable in upper and lower  respiratory specimens dur ing the acute phase of infection.  Positive  results are indicative of active infection with SARS-CoV-2.  Clinical  correlation with patient history and other diagnostic information is  necessary to determine patient infection status.  Positive results do  not rule out bacterial infection or co-infection with other viruses. If result is PRESUMPTIVE POSTIVE SARS-CoV-2 nucleic acids MAY BE PRESENT.   A presumptive positive result was obtained on the submitted specimen  and confirmed on repeat testing.  While 2019 novel coronavirus  (SARS-CoV-2) nucleic acids may be present in the submitted sample  additional confirmatory testing may be necessary for epidemiological  and / or clinical management purposes  to differentiate between  SARS-CoV-2 and other Sarbecovirus currently known to infect humans.  If clinically indicated additional testing  with an alternate test  methodology [phone]) is advised. The SARS-CoV-2 RNA is generally  detectable in upper and lower respiratory sp ecimens during the acute  phase of infection. The expected result is Negative. Fact Sheet for Patients:  StrictlyIdeas.no Fact Sheet for Healthcare Providers: BankingDealers.co.za This test is not yet approved or cleared by the [place] FDA and has been authorized for detection and/or diagnosis of SARS-CoV-2 by FDA under an Emergency Use Authorization (EUA).  This EUA will remain in effect (meaning this test can be used) for the duration of the COVID-19 declaration under Section 564(b)(1) of the Act, 21 U.S.C. section 360bbb-3(b)(1), unless the authorization is terminated or revoked sooner. Performed at [facility], [address], [place], [place] [address]   MRSA PCR Screening     Status: None   Collection Time: 12/08/18 10:49 PM  Result Value Ref Range Status   MRSA by PCR NEGATIVE NEGATIVE Final    Comment:        The GeneXpert MRSA Assay (FDA approved for NASAL specimens only), is one component of a comprehensive MRSA colonization surveillance program. It is not intended to diagnose MRSA infection nor to guide or monitor treatment for MRSA infections. Performed at [facility], [address], [place], [place] [address]          Radiology Studies: Ct Abdomen Pelvis W Contrast  Result Date: 12/08/2018 CLINICAL DATA:  Abdominal pain.  Bloody stool.  Near syncope. EXAM: CT ABDOMEN AND PELVIS WITH CONTRAST TECHNIQUE: Multidetector CT imaging of the abdomen and pelvis was performed using the standard protocol following bolus administration of intravenous contrast. CONTRAST:  85mL OMNIPAQUE IOHEXOL 300 MG/ML  SOLN COMPARISON:  None. FINDINGS: Lower chest: Normal. Hepatobiliary: Slight hepatic steatosis. No focal lesions. Biliary tree is normal. Pancreas: Unremarkable. No pancreatic ductal  dilatation or surrounding inflammatory changes. Spleen: Normal in size without focal abnormality. Adrenals/Urinary Tract: There is a 2.5 cm slightly complex exophytic cyst on the lower pole of the left kidney. There is an area of slightly increased density at the lateral aspect of this cyst. The kidneys are otherwise normal. No hydronephrosis. Bladder is normal. Adrenal glands are normal. Stomach/Bowel: There is extensive diverticulosis throughout the entire colon without evidence of diverticulitis. Moderate amount of stool in the rectum. Appendix is normal. Small bowel and stomach are normal. Vascular/Lymphatic: Minimal aortic atherosclerosis.  No adenopathy. Reproductive: Status post hysterectomy. No adnexal masses. Other: No abdominal wall hernia or abnormality. No abdominopelvic ascites. Musculoskeletal: No acute abnormality. 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 IMPRESSION: 1. Extensive diverticulosis of  the entire colon. No evidence of acute diverticulitis. 2. Complex cystic lesion on the lower pole of the left kidney. I recommend MRI of the kidneys with and without contrast for further characterization. Electronically Signed   By: Lorriane Shire M.D.   On: 12/08/2018 14:22        Scheduled Meds:  sodium chloride   Intravenous Once   Chlorhexidine Gluconate Cloth  6 each Topical Q0600   insulin aspart  0-15 Units Subcutaneous TID WC   pantoprazole (PROTONIX) IV  40 mg Intravenous Q24H   potassium chloride  40 mEq Oral Once   Continuous Infusions:   LOS: 0 days    Time spent: 30 minutes    Ameerah Huffstetler Darleen Crocker, DO [facility] Pager [phone]  If 7PM-7AM, please contact night-coverage [email or web address] Password [email or web address] 12/10/2018, 8:05 AM

## 2018-12-10 NOTE — Progress Notes (Signed)
Patient without complaints this morning side from being hungry. Passed a small amount of scant bloody liquid stool overnight. Hemoglobin down to 7.2 this morning- 1 unit of packed RBCs ordered.   Vital signs in last 24 hours: Temp:  [96.6 F (35.9 C)-99.5 F (37.5 C)] 98.9 F (37.2 C) (05/10 0728) Pulse Rate:  [66-111] 81 (05/10 0800) Resp:  [12-25] 20 (05/10 0800) BP: (109-182)/(58-92) 123/76 (05/10 0800) SpO2:  [96 %-100 %] 97 % (05/10 0800) Weight:  [74.6 kg] 74.6 kg (05/10 0500) Last BM Date: 12/09/18 General:   Alert,  Well-developed, well-nourished, pleasant and cooperative in NAD Abdomen: Nondistended.  Positive bowel sounds.  Soft and nontender.   Extremities:  Without clubbing or edema.    Intake/Output from previous day: 05/09 0701 - 05/10 0700 In: 483 [P.O.:480; I.V.:3] Out: 1100 [Urine:1100] Intake/Output this shift: No intake/output data recorded.  Lab Results: Recent Labs    12/08/18 2328 12/09/18 0529 12/09/18 1233 12/10/18 0530  WBC 7.5 7.1  --  7.9  HGB 8.4* 7.7* 8.9* 7.2*  HCT 26.7* 25.5* 28.8* 23.2*  PLT 174 177  --  169   BMET Recent Labs    12/08/18 1245 12/09/18 0529 12/10/18 0530  NA 136 140 142  K 4.4 3.5 3.4*  CL 104 111 111  CO2 23 23 24   GLUCOSE 170* 122* 106*  BUN 22 15 9   CREATININE 1.05* 0.84 0.97  CALCIUM 8.3* 8.0* 7.8*   LFT Recent Labs    12/08/18 1245  PROT 7.0  ALBUMIN 3.5  AST 27  ALT 20  ALKPHOS 40  BILITOT 0.9   PT/INR No results for input(s): LABPROT, INR in the last 72 hours. Hepatitis Panel No results for input(s): HEPBSAG, HCVAB, HEPAIGM, HEPBIGM in the last 72 hours. C-Diff No results for input(s): CDIFFTOX in the last 72 hours.  Studies/Results: Ct Abdomen Pelvis W Contrast  Result Date: 12/08/2018 CLINICAL DATA:  Abdominal pain.  Bloody stool.  Near syncope. EXAM: CT ABDOMEN AND PELVIS WITH CONTRAST TECHNIQUE: Multidetector CT imaging of the abdomen and pelvis was performed using the standard  protocol following bolus administration of intravenous contrast. CONTRAST:  31mL OMNIPAQUE IOHEXOL 300 MG/ML  SOLN COMPARISON:  None. FINDINGS: Lower chest: Normal. Hepatobiliary: Slight hepatic steatosis. No focal lesions. Biliary tree is normal. Pancreas: Unremarkable. No pancreatic ductal dilatation or surrounding inflammatory changes. Spleen: Normal in size without focal abnormality. Adrenals/Urinary Tract: There is a 2.5 cm slightly complex exophytic cyst on the lower pole of the left kidney. There is an area of slightly increased density at the lateral aspect of this cyst. The kidneys are otherwise normal. No hydronephrosis. Bladder is normal. Adrenal glands are normal. Stomach/Bowel: There is extensive diverticulosis throughout the entire colon without evidence of diverticulitis. Moderate amount of stool in the rectum. Appendix is normal. Small bowel and stomach are normal. Vascular/Lymphatic: Minimal aortic atherosclerosis.  No adenopathy. Reproductive: Status post hysterectomy. No adnexal masses. Other: No abdominal wall hernia or abnormality. No abdominopelvic ascites. Musculoskeletal: No acute abnormality. 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 IMPRESSION: 1. Extensive diverticulosis of the entire colon. No evidence of acute diverticulitis. 2. Complex cystic lesion on the lower pole of the left kidney. I recommend MRI of the kidneys with and without contrast for further characterization. Electronically Signed   By: Lorriane Shire M.D.   On: 12/08/2018 14:22    Impression: Sigmoid diverticular bleed-status post endoscopic treatment.  Bleeding seems to have ceased.  Colonic polyp -removed.  Recommendations: Agree with advancing  diet.  Avoid ASA/NSAIDs x7 days.  I will follow-up on polyp pathology. Anticipate discharge from a GI standpoint possibly tomorrow so long as she does well.  Further evaluation of suspicious left renal lesion as deemed appropriate per attending.

## 2018-12-11 ENCOUNTER — Telehealth: Payer: Self-pay | Admitting: Gastroenterology

## 2018-12-11 ENCOUNTER — Encounter (HOSPITAL_COMMUNITY): Payer: Self-pay | Admitting: Internal Medicine

## 2018-12-11 DIAGNOSIS — K5791 Diverticulosis of intestine, part unspecified, without perforation or abscess with bleeding: Secondary | ICD-10-CM

## 2018-12-11 LAB — BPAM RBC
Blood Product Expiration Date: 202005192359
ISSUE DATE / TIME: 202005101143
Unit Type and Rh: 5100

## 2018-12-11 LAB — BASIC METABOLIC PANEL
Anion gap: 5 (ref 5–15)
BUN: 7 mg/dL — ABNORMAL LOW (ref 8–23)
CO2: 24 mmol/L (ref 22–32)
Calcium: 8.3 mg/dL — ABNORMAL LOW (ref 8.9–10.3)
Chloride: 111 mmol/L (ref 98–111)
Creatinine, Ser: 0.88 mg/dL (ref 0.44–1.00)
GFR calc Af Amer: >60 mL/min (ref >60)
GFR calc non Af Amer: >60 mL/min (ref >60)
Glucose, Bld: 116 mg/dL — ABNORMAL HIGH (ref 70–99)
Potassium: 3.4 mmol/L — ABNORMAL LOW (ref 3.5–5.1)
Sodium: 140 mmol/L (ref 135–145)

## 2018-12-11 LAB — CBC
HCT: 27 % — ABNORMAL LOW (ref 36.0–46.0)
Hemoglobin: 8.6 g/dL — ABNORMAL LOW (ref 12.0–15.0)
MCH: 28.8 pg (ref 26.0–34.0)
MCHC: 31.9 g/dL (ref 30.0–36.0)
MCV: 90.3 fL (ref 80.0–100.0)
Platelets: 189 10*3/uL (ref 150–400)
RBC: 2.99 MIL/uL — ABNORMAL LOW (ref 3.87–5.11)
RDW: 13.8 % (ref 11.5–15.5)
WBC: 5.6 10*3/uL (ref 4.0–10.5)
nRBC: 0.4 % — ABNORMAL HIGH (ref 0.0–0.2)

## 2018-12-11 LAB — TYPE AND SCREEN
ABO/RH(D): O POS
Antibody Screen: NEGATIVE
Unit division: 0

## 2018-12-11 LAB — GLUCOSE, CAPILLARY: Glucose-Capillary: 113 mg/dL — ABNORMAL HIGH (ref 70–99)

## 2018-12-11 LAB — MAGNESIUM: Magnesium: 1.7 mg/dL (ref 1.7–2.4)

## 2018-12-11 MED ORDER — PANTOPRAZOLE SODIUM 40 MG PO TBEC: 40.0000 mg | DELAYED_RELEASE_TABLET | Freq: Every day | ORAL | 0 refills | Status: DC

## 2018-12-11 MED ORDER — POTASSIUM CHLORIDE CRYS ER 20 MEQ PO TBCR
40.0000 meq | EXTENDED_RELEASE_TABLET | Freq: Once | ORAL | Status: AC
  Administered 2018-12-11: 40 meq via ORAL
  Filled 2018-12-11: qty 2

## 2018-12-11 NOTE — Clinical Social Work Note (Signed)
Left message with PCP asking for hospital follow up appointment.

## 2018-12-11 NOTE — Progress Notes (Signed)
Nsg Discharge Note  Admit Date:  12/08/2018 Discharge date: 12/11/2018   Miachel Roux to be D/C'd Home per MD order.  AVS completed.  Copy for chart, and copy for patient signed, and dated. Patient/caregiver able to verbalize understanding.  Discharge Medication: Allergies as of 12/11/2018   No Known Allergies     Medication List    STOP taking these medications   aspirin EC 81 MG tablet     TAKE these medications   amLODipine 10 MG tablet Commonly known as:  NORVASC Take 10 mg by mouth at bedtime.   CULTURELLE PROBIOTICS PO Take 1 capsule by mouth daily.   losartan 100 MG tablet Commonly known as:  COZAAR Take 100 mg by mouth daily.   metFORMIN 1000 MG tablet Commonly known as:  GLUCOPHAGE Take 1,000 mg by mouth 2 (two) times daily.   pantoprazole 40 MG tablet Commonly known as:  PROTONIX Take 1 tablet (40 mg total) by mouth daily for 30 days. Start taking on:  Dec 12, 2018   Systane 0.4-0.3 % Soln Generic drug:  Polyethyl Glycol-Propyl Glycol Place 1 drop into both eyes 2 (two) times daily as needed. IN THE MORNING & NOON.       Discharge Assessment: Vitals:   12/10/18 2107 12/11/18 0501  BP: 117/68 138/82  Pulse: 74 74  Resp: 20 20  Temp: 98.6 F (37 C) 98 F (36.7 C)  SpO2: 99% 100%   Skin clean, dry and intact without evidence of skin break down, no evidence of skin tears noted. IV catheter discontinued intact. Site without signs and symptoms of complications - no redness or edema noted at insertion site, patient denies c/o pain - only slight tenderness at site.  Dressing with slight pressure applied.  D/c Instructions-Education: Discharge instructions given to patient/family with verbalized understanding. D/c education completed with patient/family including follow up instructions, medication list, d/c activities limitations if indicated, with other d/c instructions as indicated by MD - patient able to verbalize understanding, all questions fully  answered. Patient instructed to return to ED, call 911, or call MD for any changes in condition.  Patient escorted via [place], and D/C home via private auto.  Loa Socks, RN 12/11/2018 8:47 AM

## 2018-12-11 NOTE — Progress Notes (Addendum)
    Subjective: No acute complaints today. Patient is doing well and excited to go home. Denies any further bleeding. No abdominal pain, nausea, or vomiting. No BM since admission, but has just started eating a regular diet and is passing gas.    Objective: Vital signs in last 24 hours: Temp:  [98 F (36.7 C)-99.1 F (37.3 C)] 98 F (36.7 C) (05/11 0501) Pulse Rate:  [74-92] 74 (05/11 0501) Resp:  [17-21] 20 (05/11 0501) BP: (103-138)/(64-92) 138/82 (05/11 0501) SpO2:  [99 %-100 %] 100 % (05/11 0501) Last BM Date: 12/10/18 General:   Alert and oriented, pleasant Abdomen:  Bowel sounds present in a  four quadrants. Soft, non-tender, non-distended. No masses.  Neurologic:  Alert and  oriented  Psych: Normal mood and affect.  Intake/Output from previous day: 05/10 0701 - 05/11 0700 In: 856.5 [P.O.:480; I.V.:30.5; Blood:346] Out: 200 [Urine:200] Intake/Output this shift: No intake/output data recorded.  Lab Results: Recent Labs    12/09/18 0529  12/10/18 0530 12/10/18 1550 12/11/18 0601  WBC 7.1  --  7.9  --  5.6  HGB 7.7*   < > 7.2* 8.0* 8.6*  HCT 25.5*   < > 23.2* 25.9* 27.0*  PLT 177  --  169  --  189   < > = values in this interval not displayed.   BMET Recent Labs    12/09/18 0529 12/10/18 0530 12/11/18 0601  NA 140 142 140  K 3.5 3.4* 3.4*  CL 111 111 111  CO2 23 24 24   GLUCOSE 122* 106* 116*  BUN 15 9 7*  CREATININE 0.84 0.97 0.88  CALCIUM 8.0* 7.8* 8.3*   LFT Recent Labs    12/08/18 1245  PROT 7.0  ALBUMIN 3.5  AST 27  ALT 20  ALKPHOS 40  BILITOT 0.9    Assessment: 73 year old female admitted with acute blood loss anemia secondary to diverticular bleed, s/p colonoscopy with large tic in sigmoid colon with fresh blood s/p epi injection. One polyp removed. Total of 1 unit PRBCs during admission. Doing well without further overt GI bleeding. Hgb improving. To be discharged today. Discussed with patient supplemental fiber, avoidance of NSAIDs. May  resume 81 mg aspirin if clinically necessary in 7 days. Will arrange outpatient appointment with GI.   Complex cystic lesion of left kidney: noted on CT imaging this admission. Further evaluation will be needed as outpatient.    Plan: Discharge today Will follow-up on pathology as comes available Avoidance of NSAIDs; if clinically necessary, may resume 81 mg aspirin in 7 days High fiber diet Avoidance of constipation Follow-up for cystic lesion of kidney as outpatient    Annitta Needs, PhD, ANP-BC [facility]     LOS: 1 day    12/11/2018, 8:07 AM

## 2018-12-11 NOTE — Telephone Encounter (Signed)
Melanie Welch, will you arrange a hospital follow up in 2 months?  We will recheck CBC at that time.

## 2018-12-11 NOTE — Discharge Summary (Signed)
Physician Discharge Summary  Melanie Welch JQB:[id number] DOB: 04-Aug-1945 DOA: 12/08/2018  PCP: [name], MD  Admit date: 12/08/2018  Discharge date: 12/11/2018  Admitted From:Home   Disposition:  Home  Recommendations for Outpatient Follow-up:  1. Follow up with PCP in 1-2 weeks 2. Repeat CBC in 1 week 3. Remain off aspirin and NSAIDs for the next 7 days and then may resume 4. Remain on Protonix for the next 30 days 5. Recommended MRI of the abdomen in outpatient setting to evaluate left lower kidney cyst  Home Health: None  Equipment/Devices: None  Discharge Condition: Stable  CODE STATUS: Full  Diet recommendation: Heart Healthy/carb modified  Brief/Interim Summary: Per HPI: Melanie Welch a 73 y.o.femalewith a history of diabetes, hypertension, MGUS. Patient seen earlier today for lower quadrant abdominal pain. Patient had a CT scan which showed diverticulosis without infection and was sent home after pain had improved. Patient had episode of bloody stool with large blood clot. She immediately felt weak and lightheaded which gradually improved. She came back to the hospital for evaluation. No palliating or provoking factors. Had maroon bloody stool on rectal exam. No current abdominal pain. No lightheadedness, dizziness.  Patient has been admitted with lower GI bleed with suspicion of diverticular bleed.Her hemoglobin is currently downtrending,but she is asymptomatic.  She has undergone colonoscopy on 5/9 with findings of diverticulosis with no active bleeding.  She has had no further bloody bowel movements noted.  Her hemoglobin was noted to drop to 7.2 on 5/10 and therefore she underwent 1 unit PRBC transfusion with improvement to 8.6 noted this morning.  She has had no further bloody bowel movements noted and will remain off of aspirin and NSAIDs for the next 7 days and remain on Protonix for the next 30 days.  She was also incidentally noted to have a left lower  kidney cyst on CT of her abdomen which will require follow-up MRI outpatient.  She was offered this in the inpatient setting, but refused for now and stated that she will follow-up with her PCP regarding this finding.  No other acute events noted throughout the course of this admission.   Discharge Diagnoses:  Active Problems:   Palpable thyroid   Diabetes mellitus without complication (HCC)   Essential hypertension, benign   Lower GI bleed   GIB (gastrointestinal bleeding)  Principal discharge diagnosis: Acute blood loss anemia secondary to diverticular bleed.  Discharge Instructions  Discharge Instructions    Diet - low sodium heart healthy   Complete by:  As directed    Increase activity slowly   Complete by:  As directed      Allergies as of 12/11/2018   No Known Allergies     Medication List    STOP taking these medications   aspirin EC 81 MG tablet     TAKE these medications   amLODipine 10 MG tablet Commonly known as:  NORVASC Take 10 mg by mouth at bedtime.   CULTURELLE PROBIOTICS PO Take 1 capsule by mouth daily.   losartan 100 MG tablet Commonly known as:  COZAAR Take 100 mg by mouth daily.   metFORMIN 1000 MG tablet Commonly known as:  GLUCOPHAGE Take 1,000 mg by mouth 2 (two) times daily.   pantoprazole 40 MG tablet Commonly known as:  PROTONIX Take 1 tablet (40 mg total) by mouth daily for 30 days. Start taking on:  Dec 12, 2018   Systane 0.4-0.3 % Soln Generic drug:  Polyethyl Glycol-Propyl Glycol Place 1  drop into both eyes 2 (two) times daily as needed. IN THE MORNING & NOON.      Follow-up Information    [name], MD Follow up in 1 week(s).   Specialty:  Family Medicine Contact information: [address] [place] [address] [phone]          No Known Allergies  Consultations:  GI   Procedures/Studies: Ct Abdomen Pelvis W Contrast  Result Date: 12/08/2018 CLINICAL DATA:  Abdominal pain.  Bloody stool.   Near syncope. EXAM: CT ABDOMEN AND PELVIS WITH CONTRAST TECHNIQUE: Multidetector CT imaging of the abdomen and pelvis was performed using the standard protocol following bolus administration of intravenous contrast. CONTRAST:  74mL OMNIPAQUE IOHEXOL 300 MG/ML  SOLN COMPARISON:  None. FINDINGS: Lower chest: Normal. Hepatobiliary: Slight hepatic steatosis. No focal lesions. Biliary tree is normal. Pancreas: Unremarkable. No pancreatic ductal dilatation or surrounding inflammatory changes. Spleen: Normal in size without focal abnormality. Adrenals/Urinary Tract: There is a 2.5 cm slightly complex exophytic cyst on the lower pole of the left kidney. There is an area of slightly increased density at the lateral aspect of this cyst. The kidneys are otherwise normal. No hydronephrosis. Bladder is normal. Adrenal glands are normal. Stomach/Bowel: There is extensive diverticulosis throughout the entire colon without evidence of diverticulitis. Moderate amount of stool in the rectum. Appendix is normal. Small bowel and stomach are normal. Vascular/Lymphatic: Minimal aortic atherosclerosis.  No adenopathy. Reproductive: Status post hysterectomy. No adnexal masses. Other: No abdominal wall hernia or abnormality. No abdominopelvic ascites. Musculoskeletal: No acute abnormality. 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 IMPRESSION: 1. Extensive diverticulosis of the entire colon. No evidence of acute diverticulitis. 2. Complex cystic lesion on the lower pole of the left kidney. I recommend MRI of the kidneys with and without contrast for further characterization. Electronically Signed   By: Lorriane Shire M.D.   On: 12/08/2018 14:22     Discharge Exam: Vitals:   12/10/18 2107 12/11/18 0501  BP: 117/68 138/82  Pulse: 74 74  Resp: 20 20  Temp: 98.6 F (37 C) 98 F (36.7 C)  SpO2: 99% 100%   Vitals:   12/10/18 1205 12/10/18 1443 12/10/18 2107 12/11/18 0501  BP: 105/68 114/68 117/68 138/82  Pulse: 78  76 74 74  Resp: 20  20 20   Temp: 98.7 F (37.1 C) 98.3 F (36.8 C) 98.6 F (37 C) 98 F (36.7 C)  TempSrc: Oral Oral Oral Oral  SpO2: 100% 100% 99% 100%  Weight:      Height:        General: Pt is alert, awake, not in acute distress Cardiovascular: RRR, S1/S2 +, no rubs, no gallops Respiratory: CTA bilaterally, no wheezing, no rhonchi Abdominal: Soft, NT, ND, bowel sounds + Extremities: no edema, no cyanosis    The results of significant diagnostics from this hospitalization (including imaging, microbiology, ancillary and laboratory) are listed below for reference.     Microbiology: Recent Results (from the past 240 hour(s))  SARS Coronavirus 2 (CEPHEID - Performed in [facility] hospital lab), Hosp Order     Status: None   Collection Time: 12/08/18  7:50 PM  Result Value Ref Range Status   SARS Coronavirus 2 NEGATIVE NEGATIVE Final    Comment: (NOTE) If result is NEGATIVE SARS-CoV-2 target nucleic acids are NOT DETECTED. The SARS-CoV-2 RNA is generally detectable in upper and lower  respiratory specimens during the acute phase of infection. The lowest  concentration of SARS-CoV-2 viral copies this assay can detect  is 250  copies / mL. A negative result does not preclude SARS-CoV-2 infection  and should not be used as the sole basis for treatment or other  patient management decisions.  A negative result may occur with  improper specimen collection / handling, submission of specimen other  than nasopharyngeal swab, presence of viral mutation(s) within the  areas targeted by this assay, and inadequate number of viral copies  (<250 copies / mL). A negative result must be combined with clinical  observations, patient history, and epidemiological information. If result is POSITIVE SARS-CoV-2 target nucleic acids are DETECTED. The SARS-CoV-2 RNA is generally detectable in upper and lower  respiratory specimens dur ing the acute phase of infection.  Positive  results are  indicative of active infection with SARS-CoV-2.  Clinical  correlation with patient history and other diagnostic information is  necessary to determine patient infection status.  Positive results do  not rule out bacterial infection or co-infection with other viruses. If result is PRESUMPTIVE POSTIVE SARS-CoV-2 nucleic acids MAY BE PRESENT.   A presumptive positive result was obtained on the submitted specimen  and confirmed on repeat testing.  While 2019 novel coronavirus  (SARS-CoV-2) nucleic acids may be present in the submitted sample  additional confirmatory testing may be necessary for epidemiological  and / or clinical management purposes  to differentiate between  SARS-CoV-2 and other Sarbecovirus currently known to infect humans.  If clinically indicated additional testing with an alternate test  methodology [phone]) is advised. The SARS-CoV-2 RNA is generally  detectable in upper and lower respiratory sp ecimens during the acute  phase of infection. The expected result is Negative. Fact Sheet for Patients:  StrictlyIdeas.no Fact Sheet for Healthcare Providers: BankingDealers.co.za This test is not yet approved or cleared by the [place] FDA and has been authorized for detection and/or diagnosis of SARS-CoV-2 by FDA under an Emergency Use Authorization (EUA).  This EUA will remain in effect (meaning this test can be used) for the duration of the COVID-19 declaration under Section 564(b)(1) of the Act, 21 U.S.C. section 360bbb-3(b)(1), unless the authorization is terminated or revoked sooner. Performed at [facility], [address], [place], [place] [address]   MRSA PCR Screening     Status: None   Collection Time: 12/08/18 10:49 PM  Result Value Ref Range Status   MRSA by PCR NEGATIVE NEGATIVE Final    Comment:        The GeneXpert MRSA Assay (FDA approved for NASAL specimens only), is one component of a comprehensive  MRSA colonization surveillance program. It is not intended to diagnose MRSA infection nor to guide or monitor treatment for MRSA infections. Performed at [facility], [address], [place], [place] [address]      Labs: BNP (last 3 results) No results for input(s): BNP in the last 8760 hours. Basic Metabolic Panel: Recent Labs  Lab 12/08/18 1245 12/09/18 0529 12/10/18 0530 12/11/18 0601  NA 136 140 142 140  K 4.4 3.5 3.4* 3.4*  CL 104 111 111 111  CO2 23 23 24 24   GLUCOSE 170* 122* 106* 116*  BUN 22 15 9  7*  CREATININE 1.05* 0.84 0.97 0.88  CALCIUM 8.3* 8.0* 7.8* 8.3*  MG  --   --   --  1.7   Liver Function Tests: Recent Labs  Lab 12/08/18 1245  AST 27  ALT 20  ALKPHOS 40  BILITOT 0.9  PROT 7.0  ALBUMIN 3.5   Recent Labs  Lab 12/08/18 1245  LIPASE 44   No results for input(s): AMMONIA in the last 168 hours. CBC: Recent Labs  Lab 12/08/18 1245 12/08/18 1851 12/08/18 2328 12/09/18 0529 12/09/18 1233 12/10/18 0530 12/10/18 1550 12/11/18 0601  WBC 6.0 6.7 7.5 7.1  --  7.9  --  5.6  NEUTROABS 3.6  --   --   --   --   --   --   --   HGB 10.2* 9.0* 8.4* 7.7* 8.9* 7.2* 8.0* 8.6*  HCT 33.2* 29.8* 26.7* 25.5* 28.8* 23.2* 25.9* 27.0*  MCV 88.1 89.2 87.5 87.3  --  86.9  --  90.3  PLT 192 193 174 177  --  169  --  189   Cardiac Enzymes: No results for input(s): CKTOTAL, CKMB, CKMBINDEX, TROPONINI in the last 168 hours. BNP: Invalid input(s): POCBNP CBG: Recent Labs  Lab 12/10/18 0729 12/10/18 1132 12/10/18 1606 12/10/18 2112 12/11/18 0741  GLUCAP 201* 123* 100* 138* 113*   D-Dimer No results for input(s): DDIMER in the last 72 hours. Hgb A1c No results for input(s): HGBA1C in the last 72 hours. Lipid Profile No results for input(s): CHOL, HDL, LDLCALC, TRIG, CHOLHDL, LDLDIRECT in the last 72 hours. Thyroid function studies No results for input(s): TSH, T4TOTAL, T3FREE, THYROIDAB in the last 72 hours.  Invalid input(s): FREET3 Anemia work  up No results for input(s): VITAMINB12, FOLATE, FERRITIN, TIBC, IRON, RETICCTPCT in the last 72 hours. Urinalysis    Component Value Date/Time   COLORURINE STRAW (A) 12/08/2018 1340   APPEARANCEUR CLEAR 12/08/2018 1340   LABSPEC 1.010 12/08/2018 1340   PHURINE 5.0 12/08/2018 1340   GLUCOSEU 50 (A) 12/08/2018 1340   HGBUR NEGATIVE 12/08/2018 1340   BILIRUBINUR NEGATIVE 12/08/2018 1340   KETONESUR NEGATIVE 12/08/2018 1340   PROTEINUR NEGATIVE 12/08/2018 1340   NITRITE NEGATIVE 12/08/2018 1340   LEUKOCYTESUR NEGATIVE 12/08/2018 1340   Sepsis Labs Invalid input(s): PROCALCITONIN,  WBC,  LACTICIDVEN Microbiology Recent Results (from the past 240 hour(s))  SARS Coronavirus 2 (CEPHEID - Performed in [facility] hospital lab), Hosp Order     Status: None   Collection Time: 12/08/18  7:50 PM  Result Value Ref Range Status   SARS Coronavirus 2 NEGATIVE NEGATIVE Final    Comment: (NOTE) If result is NEGATIVE SARS-CoV-2 target nucleic acids are NOT DETECTED. The SARS-CoV-2 RNA is generally detectable in upper and lower  respiratory specimens during the acute phase of infection. The lowest  concentration of SARS-CoV-2 viral copies this assay can detect is 250  copies / mL. A negative result does not preclude SARS-CoV-2 infection  and should not be used as the sole basis for treatment or other  patient management decisions.  A negative result may occur with  improper specimen collection / handling, submission of specimen other  than nasopharyngeal swab, presence of viral mutation(s) within the  areas targeted by this assay, and inadequate number of viral copies  (<250 copies / mL). A negative result must be combined with clinical  observations, patient history, and epidemiological information. If result is POSITIVE SARS-CoV-2 target nucleic acids are DETECTED. The SARS-CoV-2 RNA is generally detectable in upper and lower  respiratory specimens dur ing the acute phase of infection.   Positive  results are indicative of active infection with SARS-CoV-2.  Clinical  correlation with patient history and other diagnostic information is  necessary to determine patient infection status.  Positive results do  not rule out bacterial infection or co-infection with other viruses. If result is PRESUMPTIVE POSTIVE  SARS-CoV-2 nucleic acids MAY BE PRESENT.   A presumptive positive result was obtained on the submitted specimen  and confirmed on repeat testing.  While 2019 novel coronavirus  (SARS-CoV-2) nucleic acids may be present in the submitted sample  additional confirmatory testing may be necessary for epidemiological  and / or clinical management purposes  to differentiate between  SARS-CoV-2 and other Sarbecovirus currently known to infect humans.  If clinically indicated additional testing with an alternate test  methodology [phone]) is advised. The SARS-CoV-2 RNA is generally  detectable in upper and lower respiratory sp ecimens during the acute  phase of infection. The expected result is Negative. Fact Sheet for Patients:  StrictlyIdeas.no Fact Sheet for Healthcare Providers: BankingDealers.co.za This test is not yet approved or cleared by the [place] FDA and has been authorized for detection and/or diagnosis of SARS-CoV-2 by FDA under an Emergency Use Authorization (EUA).  This EUA will remain in effect (meaning this test can be used) for the duration of the COVID-19 declaration under Section 564(b)(1) of the Act, 21 U.S.C. section 360bbb-3(b)(1), unless the authorization is terminated or revoked sooner. Performed at [facility], [address], [place], [place] [address]   MRSA PCR Screening     Status: None   Collection Time: 12/08/18 10:49 PM  Result Value Ref Range Status   MRSA by PCR NEGATIVE NEGATIVE Final    Comment:        The GeneXpert MRSA Assay (FDA approved for NASAL specimens only), is one  component of a comprehensive MRSA colonization surveillance program. It is not intended to diagnose MRSA infection nor to guide or monitor treatment for MRSA infections. Performed at [facility], [address], [place], [place] [address]      Time coordinating discharge: 35 minutes  SIGNED:   Rodena Goldmann, DO [facility] 12/11/2018, 8:19 AM  If 7PM-7AM, please contact night-coverage [email or web address] Password [email or web address]

## 2018-12-12 ENCOUNTER — Encounter: Payer: Self-pay | Admitting: Gastroenterology

## 2018-12-12 NOTE — Telephone Encounter (Signed)
PATIENT SCHEDULED AND LETTER SENT  °

## 2018-12-13 ENCOUNTER — Encounter: Payer: Self-pay | Admitting: Internal Medicine

## 2018-12-14 DIAGNOSIS — E876 Hypokalemia: Secondary | ICD-10-CM | POA: Diagnosis not present

## 2018-12-14 DIAGNOSIS — K922 Gastrointestinal hemorrhage, unspecified: Secondary | ICD-10-CM | POA: Diagnosis not present

## 2018-12-14 DIAGNOSIS — Z6825 Body mass index (BMI) 25.0-25.9, adult: Secondary | ICD-10-CM | POA: Diagnosis not present

## 2018-12-14 DIAGNOSIS — E663 Overweight: Secondary | ICD-10-CM | POA: Diagnosis not present

## 2019-01-15 ENCOUNTER — Other Ambulatory Visit (HOSPITAL_COMMUNITY): Payer: Self-pay | Admitting: Family Medicine

## 2019-01-15 DIAGNOSIS — Z1231 Encounter for screening mammogram for malignant neoplasm of breast: Secondary | ICD-10-CM

## 2019-01-22 DIAGNOSIS — H524 Presbyopia: Secondary | ICD-10-CM | POA: Diagnosis not present

## 2019-01-22 DIAGNOSIS — Z961 Presence of intraocular lens: Secondary | ICD-10-CM | POA: Diagnosis not present

## 2019-01-22 DIAGNOSIS — E119 Type 2 diabetes mellitus without complications: Secondary | ICD-10-CM | POA: Diagnosis not present

## 2019-01-31 ENCOUNTER — Other Ambulatory Visit: Payer: Self-pay

## 2019-01-31 ENCOUNTER — Ambulatory Visit (HOSPITAL_COMMUNITY)
Admission: RE | Admit: 2019-01-31 | Discharge: 2019-01-31 | Disposition: A | Discharge status: Home / Self Care | Payer: Medicare HMO | Source: Ambulatory Visit | Attending: Family Medicine | Admitting: Family Medicine

## 2019-01-31 DIAGNOSIS — Z1231 Encounter for screening mammogram for malignant neoplasm of breast: Secondary | ICD-10-CM

## 2019-02-19 ENCOUNTER — Other Ambulatory Visit: Payer: Self-pay | Admitting: Family Medicine

## 2019-02-19 ENCOUNTER — Other Ambulatory Visit (HOSPITAL_COMMUNITY): Payer: Self-pay | Admitting: Family Medicine

## 2019-02-19 DIAGNOSIS — N281 Cyst of kidney, acquired: Secondary | ICD-10-CM

## 2019-02-27 ENCOUNTER — Encounter (HOSPITAL_COMMUNITY): Payer: Self-pay

## 2019-02-27 ENCOUNTER — Ambulatory Visit (HOSPITAL_COMMUNITY): Payer: Medicare HMO

## 2019-03-02 ENCOUNTER — Ambulatory Visit: Payer: Medicare HMO | Admitting: Gastroenterology

## 2019-03-02 ENCOUNTER — Encounter: Payer: Self-pay | Admitting: Gastroenterology

## 2019-03-02 ENCOUNTER — Other Ambulatory Visit: Payer: Self-pay

## 2019-03-02 VITALS — BP 134/83 | HR 76 | Temp 96.9°F | Ht 69.0 in | Wt 168.4 lb

## 2019-03-02 DIAGNOSIS — K922 Gastrointestinal hemorrhage, unspecified: Secondary | ICD-10-CM

## 2019-03-02 NOTE — Patient Instructions (Signed)
Add back supplemental fiber to your diet (Benefiber or Metamucil).   I will retrieve blood work from Dr. Hilma Favors.  We can see you back as needed!  I am glad you are doing better!  I enjoyed seeing you again today! As you know, I value our relationship and want to provide genuine, compassionate, and quality care. I welcome your feedback. If you receive a survey regarding your visit,  I greatly appreciate you taking time to fill this out. See you next time!  Annitta Needs, PhD, ANP-BC [facility]

## 2019-03-02 NOTE — Progress Notes (Signed)
Referring Provider: [name], MD Primary Care Physician:  [name], MD  Primary GI: Dr Oneida Alar   Chief Complaint  Patient presents with  . Hospitalization Follow-up    no bleeding, some lower abd pain relieved with bm    HPI:   Melanie Welch is a 73 y.o. female presenting today with a history of acute blood loss anemia secondary to diverticular bleed in May 2020, s/p colonoscopy during hospitalization. Discharge Hgb 8.6. Received 1 unit PRBCs during admission. Complex cystic lesion of left kidney: noted on CT imaging this admission. Further evaluation will be needed as outpatient. Appears MR abdomen ordered by PCP. States she was told [facility] can't do MRI due to insurance but then told she had till Aug 16th to pursue this. She will be calling PCP to arrange again.   81 mg aspirin a day. No other NSAIDs. No rectal bleeding. No constipation. Some lower abdominal discomfort with certain foods and relieved s/p BM. No straining with BM. Postprandial BMs, soft most of time. Occasionally loose depending on foods eaten. Limits diet to beans, vegetables, but beef messed her stomach up. Roast beef bothers her but not hamburgers. Stopped taking Benefiber but did well on this.   Outside labs dated 12/14/2018 (recently s/p hospitalization) with Hgb improved to 9.9.   Past Medical History:  Diagnosis Date  . Diabetes mellitus   . Hypertension   . MGUS (monoclonal gammopathy of unknown significance)   . Proteinuria     Past Surgical History:  Procedure Laterality Date  . ABDOMINAL HYSTERECTOMY    . BREAST BIOPSY  2005   left  . CATARACT EXTRACTION W/PHACO Right 05/10/2017   Procedure: CATARACT EXTRACTION PHACO AND INTRAOCULAR LENS PLACEMENT (IOC);  Surgeon: Rutherford Guys, MD;  Location: [facility];  Service: Ophthalmology;  Laterality: Right;  CDE: 5.04  . CATARACT EXTRACTION W/PHACO Left 05/24/2017   Procedure: CATARACT EXTRACTION PHACO AND INTRAOCULAR LENS PLACEMENT (IOC);   Surgeon: Rutherford Guys, MD;  Location: [facility];  Service: Ophthalmology;  Laterality: Left;  CDE: 8.40  . COLONOSCOPY  07/13/2011   Procedure: COLONOSCOPY;  Surgeon: Dorothyann Peng, MD;  Location: [facility];  Service: Endoscopy;  Laterality: N/A;  9:30 AM  . COLONOSCOPY N/A 12/09/2018   diverticulosis, suspect diverticulum in mid sigmoid s/p epi, unable to place clip. 5 mm benign polyp. Colonic lipoma. Normal TI. Many diverticula occluded with stool.   Marland Kitchen POLYPECTOMY  12/09/2018   Procedure: POLYPECTOMY;  Surgeon: Daneil Dolin, MD;  Location: [facility];  Service: Endoscopy;;  splenic flexure    Current Outpatient Medications  Medication Sig Dispense Refill  . amLODipine (NORVASC) 10 MG tablet Take 10 mg by mouth at bedtime.     . Cholecalciferol (VITAMIN D3 PO) Take by mouth daily.    Marland Kitchen losartan (COZAAR) 100 MG tablet Take 100 mg by mouth daily.    . metFORMIN (GLUCOPHAGE) 1000 MG tablet Take 1,000 mg by mouth 2 (two) times daily.  0  . Polyethyl Glycol-Propyl Glycol (SYSTANE) 0.4-0.3 % SOLN Place 1 drop into both eyes 2 (two) times daily as needed. IN THE MORNING & NOON.     . vitamin C (ASCORBIC ACID) 500 MG tablet Take 500 mg by mouth 2 (two) times daily.    . pantoprazole (PROTONIX) 40 MG tablet Take 1 tablet (40 mg total) by mouth daily for 30 days. (Patient not taking: Reported on 03/02/2019) 30 tablet 0  . Probiotic Product (CULTURELLE PROBIOTICS PO)  Take 1 capsule by mouth daily.     No current facility-administered medications for this visit.     Allergies as of 03/02/2019  . (No Known Allergies)    Family History  Problem Relation Age of Onset  . Cancer Mother   . Cancer Sister     Social History   Socioeconomic History  . Marital status: Divorced    Spouse name: Not on file  . Number of children: Not on file  . Years of education: Not on file  . Highest education level: Not on file  Occupational History  . Not on file  Social Needs  . Financial resource  strain: Not on file  . Food insecurity    Worry: Not on file    Inability: Not on file  . Transportation needs    Medical: Not on file    Non-medical: Not on file  Tobacco Use  . Smoking status: Former Research scientist (life sciences)  . Smokeless tobacco: Never Used  Substance and Sexual Activity  . Alcohol use: Yes    Comment: rare wine  . Drug use: No  . Sexual activity: Not Currently  Lifestyle  . Physical activity    Days per week: Not on file    Minutes per session: Not on file  . Stress: Not on file  Relationships  . Social Herbalist on phone: Not on file    Gets together: Not on file    Attends religious service: Not on file    Active member of club or organization: Not on file    Attends meetings of clubs or organizations: Not on file    Relationship status: Not on file  Other Topics Concern  . Not on file  Social History Narrative  . Not on file    Review of Systems: Gen: Denies fever, chills, anorexia. Denies fatigue, weakness, weight loss.  CV: Denies chest pain, palpitations, syncope, peripheral edema, and claudication. Resp: Denies dyspnea at rest, cough, wheezing, coughing up blood, and pleurisy. GI: see HPI Derm: Denies rash, itching, dry skin Psych: Denies depression, anxiety, memory loss, confusion. No homicidal or suicidal ideation.  Heme: Denies bruising, bleeding, and enlarged lymph nodes.  Physical Exam: BP 134/83   Pulse 76   Temp (!) 96.9 F (36.1 C) (Temporal)   Ht 5\' 9"  (1.753 m)   Wt 168 lb 6.4 oz (76.4 kg)   BMI 24.87 kg/m  General:   Alert and oriented. No distress noted. Pleasant and cooperative.  Head:  Normocephalic and atraumatic. Eyes:  Conjuctiva clear without scleral icterus. Abdomen:  +BS, soft, non-tender and non-distended. No rebound or guarding. No HSM or masses noted. Msk:  Symmetrical without gross deformities. Normal posture. Extremities:  Without edema. Neurologic:  Alert and  oriented x4 Psych:  Alert and cooperative. Normal  mood and affect.

## 2019-03-07 ENCOUNTER — Telehealth: Payer: Self-pay | Admitting: Gastroenterology

## 2019-03-07 NOTE — Telephone Encounter (Signed)
Called pt and could not reach on home phone.  Called mobile and left VM for a return call.

## 2019-03-07 NOTE — Assessment & Plan Note (Signed)
With acute blood loss anemia due to diverticular bleed May 2020, s/p colonoscopy. Clinically improved and remains without further overt GI bleeding. Hgb just after discharge with improvement to 9.9 (May 2020). Add Benefiber back to diet, as she did well with this historically.Will recheck CBC now to ensure improved. Return as needed.

## 2019-03-07 NOTE — Telephone Encounter (Signed)
Doris, please let patient know I received blood work from May 2020 just after hospital discharge, and Hgb had improved. If there is another CBC after that, I don't have this. I do have labs from July 2020 but no CBC included. We need to update CBC now to ensure Hgb improving.

## 2019-03-08 ENCOUNTER — Other Ambulatory Visit: Payer: Self-pay

## 2019-03-08 DIAGNOSIS — K922 Gastrointestinal hemorrhage, unspecified: Secondary | ICD-10-CM

## 2019-03-08 NOTE — Telephone Encounter (Signed)
Left another VM for a return call.  Also, mailed a letter with the lab order to go to the lab when received.

## 2019-03-08 NOTE — Progress Notes (Signed)
cc'd to pcp 

## 2019-03-12 ENCOUNTER — Telehealth: Payer: Self-pay | Admitting: Gastroenterology

## 2019-03-12 NOTE — Telephone Encounter (Signed)
PATIENT CALLED AGAIN AND SAID SOMEONE JUST CALLED HER

## 2019-03-12 NOTE — Telephone Encounter (Signed)
Pt is aware to go to the lab for a CBC.

## 2019-03-12 NOTE — Telephone Encounter (Signed)
PT is aware to go to the lab for CBC.

## 2019-03-12 NOTE — Telephone Encounter (Signed)
Pt was returning a call from DS on Friday. Please call her back  [phone]

## 2019-03-16 ENCOUNTER — Other Ambulatory Visit: Payer: Self-pay | Admitting: Gastroenterology

## 2019-03-16 DIAGNOSIS — K922 Gastrointestinal hemorrhage, unspecified: Secondary | ICD-10-CM | POA: Diagnosis not present

## 2019-03-17 LAB — CBC WITH DIFFERENTIAL/PLATELET
Basophils Absolute: 0 x10E3/uL (ref 0.0–0.2)
Basos: 1 %
EOS (ABSOLUTE): 0.1 x10E3/uL (ref 0.0–0.4)
Eos: 3 %
Hematocrit: 38.3 % (ref 34.0–46.6)
Hemoglobin: 12.4 g/dL (ref 11.1–15.9)
Immature Grans (Abs): 0 x10E3/uL (ref 0.0–0.1)
Immature Granulocytes: 0 %
Lymphocytes Absolute: 2.3 x10E3/uL (ref 0.7–3.1)
Lymphs: 48 %
MCH: 25.6 pg — ABNORMAL LOW (ref 26.6–33.0)
MCHC: 32.4 g/dL (ref 31.5–35.7)
MCV: 79 fL (ref 79–97)
Monocytes Absolute: 0.5 x10E3/uL (ref 0.1–0.9)
Monocytes: 10 %
Neutrophils Absolute: 1.8 x10E3/uL (ref 1.4–7.0)
Neutrophils: 38 %
Platelets: 279 x10E3/uL (ref 150–450)
RBC: 4.84 x10E6/uL (ref 3.77–5.28)
RDW: 14.5 % (ref 11.7–15.4)
WBC: 4.8 x10E3/uL (ref 3.4–10.8)

## 2019-03-19 DIAGNOSIS — N281 Cyst of kidney, acquired: Secondary | ICD-10-CM | POA: Diagnosis not present

## 2019-03-19 NOTE — Telephone Encounter (Signed)
Lab order received and placed for SL[facility] to review

## 2019-03-19 NOTE — Progress Notes (Signed)
Hgb now normal!

## 2019-03-26 ENCOUNTER — Other Ambulatory Visit: Payer: Self-pay

## 2019-03-26 DIAGNOSIS — Z20822 Contact with and (suspected) exposure to covid-19: Secondary | ICD-10-CM

## 2019-03-27 LAB — NOVEL CORONAVIRUS, NAA: SARS-CoV-2, NAA: NOT DETECTED

## 2019-03-29 NOTE — Telephone Encounter (Signed)
PLEASE CALL PT. Her blood count is normal.

## 2019-03-29 NOTE — Telephone Encounter (Signed)
PT is aware.

## 2019-04-02 DIAGNOSIS — E1029 Type 1 diabetes mellitus with other diabetic kidney complication: Secondary | ICD-10-CM | POA: Diagnosis not present

## 2019-04-02 DIAGNOSIS — I10 Essential (primary) hypertension: Secondary | ICD-10-CM | POA: Diagnosis not present

## 2019-04-02 DIAGNOSIS — E119 Type 2 diabetes mellitus without complications: Secondary | ICD-10-CM | POA: Diagnosis not present

## 2019-04-02 DIAGNOSIS — E782 Mixed hyperlipidemia: Secondary | ICD-10-CM | POA: Diagnosis not present

## 2019-05-02 DIAGNOSIS — E1165 Type 2 diabetes mellitus with hyperglycemia: Secondary | ICD-10-CM | POA: Diagnosis not present

## 2019-05-02 DIAGNOSIS — I10 Essential (primary) hypertension: Secondary | ICD-10-CM | POA: Diagnosis not present

## 2019-05-02 DIAGNOSIS — E782 Mixed hyperlipidemia: Secondary | ICD-10-CM | POA: Diagnosis not present

## 2019-05-10 ENCOUNTER — Other Ambulatory Visit: Payer: Self-pay

## 2019-05-10 DIAGNOSIS — Z20828 Contact with and (suspected) exposure to other viral communicable diseases: Secondary | ICD-10-CM | POA: Diagnosis not present

## 2019-05-10 DIAGNOSIS — Z20822 Contact with and (suspected) exposure to covid-19: Secondary | ICD-10-CM

## 2019-05-11 LAB — NOVEL CORONAVIRUS, NAA: SARS-CoV-2, NAA: NOT DETECTED

## 2019-05-14 DIAGNOSIS — D4102 Neoplasm of uncertain behavior of left kidney: Secondary | ICD-10-CM | POA: Diagnosis not present

## 2019-06-12 DIAGNOSIS — D4102 Neoplasm of uncertain behavior of left kidney: Secondary | ICD-10-CM | POA: Diagnosis not present

## 2019-07-12 DIAGNOSIS — Z23 Encounter for immunization: Secondary | ICD-10-CM | POA: Diagnosis not present

## 2019-07-12 DIAGNOSIS — I10 Essential (primary) hypertension: Secondary | ICD-10-CM | POA: Diagnosis not present

## 2019-07-12 DIAGNOSIS — Z6825 Body mass index (BMI) 25.0-25.9, adult: Secondary | ICD-10-CM | POA: Diagnosis not present

## 2019-07-12 DIAGNOSIS — E119 Type 2 diabetes mellitus without complications: Secondary | ICD-10-CM | POA: Diagnosis not present

## 2019-07-12 DIAGNOSIS — E7849 Other hyperlipidemia: Secondary | ICD-10-CM | POA: Diagnosis not present

## 2019-07-12 DIAGNOSIS — E663 Overweight: Secondary | ICD-10-CM | POA: Diagnosis not present

## 2019-07-18 ENCOUNTER — Other Ambulatory Visit: Payer: Self-pay

## 2019-07-18 ENCOUNTER — Encounter: Payer: Self-pay | Admitting: Gastroenterology

## 2019-07-18 ENCOUNTER — Ambulatory Visit: Payer: Medicare HMO | Admitting: Gastroenterology

## 2019-07-18 DIAGNOSIS — K760 Fatty (change of) liver, not elsewhere classified: Secondary | ICD-10-CM | POA: Insufficient documentation

## 2019-07-18 DIAGNOSIS — K922 Gastrointestinal hemorrhage, unspecified: Secondary | ICD-10-CM | POA: Diagnosis not present

## 2019-07-18 NOTE — Progress Notes (Signed)
ON RECALL  °

## 2019-07-18 NOTE — Assessment & Plan Note (Signed)
LIKELY DUE TO DIABETES.   CHECK LIVER PANEL ONCE A YEAR. AN ALTERNATIVE PRIMARY PROVIDER IS DR. Benny Lennert, Address: [address], [place], [place] [address], Phone: [phone]. EAT TO LIVE AND THINK OF FOOD AS MEDICINE. 75% OF YOUR PLATE SHOULD BE FRUITS/VEGGIES.  To have more energy, and to lose weight:     1. CONTINUE YOUR WEIGHT LOSS EFFORTS. I RECOMMEND YOU READ AND FOLLOW RECOMMENDATIONS BY DR. MARK HYMAN, "10-DAY DETOX DIET".   2. If you must eat bread, EAT EZEKIEL BREAD. IT IS IN THE FROZEN SECTION OF THE GROCERY STORE.   3. DRINK WATER WITH FRUIT OR CUCUMBER ADDED. YOUR URINE SHOULD BE LIGHT YELLOW. AVOID SODA, GATORADE, ENERGY DRINKS, OR DIET SODA.    4. AVOID HIGH FRUCTOSE CORN SYRUP AND CAFFEINE.    5. DO NOT chew SUGAR FREE GUM OR USE ARTIFICIAL SWEETENERS. IF NEEDED USE STEVIA AS A SWEETENER.   6. DO NOT EAT ENRICHED WHEAT FLOUR, PASTA, RICE, OR CEREAL.   7. ONLY EAT WILD CAUGHT SEAFOOD, GRASS FED BEEF OR CHICKEN, PORK FROM PASTURE RAISE PIGS, OR EGGS FROM PASTURE RAISED CHICKENS.   8. PRACTICE CHAIR YOGA FOR 15-30 MINS 3 OR 4 TIMES A WEEK AND PROGRESS TO HATHA YOGA OVER NEXT 6 MOS.   9. START TAKING VITAMIN B12 DAILY.  10. CONTINUE MULTIVITAMIN  AND VITAMIN D3 2000 IU DAILY.  ADDITIONAL SUPPLEMENTS TO DECREASE CRAVING AND SUPPRESS YOUR APPETITE:   1. CINNAMON 500 MG EVERY AM PRIOR TO FIRST MEAL.   **STABILIZES BLOOD GLUCOSE/REDUCES CRAVINGS**   2. CHROMIUM 400-500 MG WITH MEALS TWICE DAILY.    **FAT BURNER**   3. GREEN TEA EXTRACT ONE DAILY.   **FAT BURNER/SUPPRESSES YOUR APPETITE**   4. ALPHA LIPOIC ACID TWICE DAILY.   **NATURAL ANTI-INFLAMMATORY SUPPLEMENT THAT IS AN ALTERNATIVE TO IBUPROFEN OR NAPROXEN**

## 2019-07-18 NOTE — Assessment & Plan Note (Signed)
SYMPTOMS CONTROLLED/RESOLVED.  CONTINUE TO MONITOR SYMPTOMS. STRICTLY AVOID ASPIRIN, BC/GOODY POWDERS, IBUPROFEN/MOTRIN, OR NAPROXEN/ALEVE UNLESS IT'S MEDICALLY NECESSARY. THESE MEDS WILL INCREASE THE RISK OF BLEEDING FROM DIVERTICULOSIS.  FOLLOW UP IN 1 YEAR.

## 2019-07-18 NOTE — Progress Notes (Signed)
Subjective:    Patient ID: Melanie Welch, female    DOB: Mar 04, 1946, 73 y.o.   MRN: [id number]  Melanie Sites, MD  HPI CONCERNED ABOUT HER LIVER. [place] LGIB. TCS INJECTED WITH EPINEPHRINE. HAD MRI FOR SPOT ON THE KIDNEY.  PT DENIES FEVER, CHILLS, HEMATOCHEZIA, HEMATEMESIS, nausea, vomiting, melena, diarrhea, CHEST PAIN, SHORTNESS OF BREATH,  CHANGE IN BOWEL IN HABITS, constipation, abdominal pain, problems swallowing, problems with sedation, heartburn or indigestion.  Past Medical History:  Diagnosis Date  . Diabetes mellitus   . Hypertension   . MGUS (monoclonal gammopathy of unknown significance)   . Proteinuria     Past Surgical History:  Procedure Laterality Date  . ABDOMINAL HYSTERECTOMY    . BREAST BIOPSY  2005   left  . CATARACT EXTRACTION W/PHACO Right 05/10/2017   Procedure: CATARACT EXTRACTION PHACO AND INTRAOCULAR LENS PLACEMENT (IOC);  Surgeon: Rutherford Guys, MD;  Location: [facility];  Service: Ophthalmology;  Laterality: Right;  CDE: 5.04  . CATARACT EXTRACTION W/PHACO Left 05/24/2017   Procedure: CATARACT EXTRACTION PHACO AND INTRAOCULAR LENS PLACEMENT (IOC);  Surgeon: Rutherford Guys, MD;  Location: [facility];  Service: Ophthalmology;  Laterality: Left;  CDE: 8.40  . COLONOSCOPY  07/13/2011   Procedure: COLONOSCOPY;  Surgeon: Dorothyann Peng, MD;  [facility];  Service: Endoscopy;  Laterality: N/A;  9:30 AM  . COLONOSCOPY N/A 12/09/2018   diverticulosis, suspect diverticulum in mid sigmoid s/p epi, unable to place clip. 5 mm benign polyp. Colonic lipoma. Normal TI. Many diverticula occluded with stool.   Marland Kitchen POLYPECTOMY  12/09/2018   Procedure: POLYPECTOMY;  Surgeon: Daneil Dolin, MD;  [facility];  Service: Endoscopy;;  splenic flexure   No Known Allergies  Current Outpatient Medications  Medication Sig    . amLODipine (NORVASC) 10 MG tablet Take 10 mg by mouth at bedtime.     . Cholecalciferol (VITAMIN D3 PO) Take by mouth  daily.    . Coenzyme Q10 (CO Q 10 PO) Take by mouth daily.    Marland Kitchen losartan (COZAAR) 100 MG tablet Take 100 mg by mouth daily.    . metFORMIN (GLUCOPHAGE) 1000 MG tablet Take 1,000 mg by mouth 2 (two) times daily.    . Multiple Vitamin (MULTIVITAMIN) tablet Take 1 tablet by mouth daily.    . Omega-3 Fatty Acids (FISH OIL PO) Take by mouth daily.    Vladimir Faster Glycol-Propyl Glycol (SYSTANE) 0.4-0.3 % SOLN Place 1 drop into both eyes 2 (two) times daily as needed. IN THE MORNING & NOON.     . vitamin C (ASCORBIC ACID) 500 MG tablet Take 500 mg by mouth 2 (two) times daily.     Review of Systems PER HPI OTHERWISE ALL SYSTEMS ARE NEGATIVE.    Objective:   Physical Exam Constitutional:      General: She is not in acute distress.    Appearance: Normal appearance.  HENT:     Mouth/Throat:     Comments: MASK IN PLACE Eyes:     General: No scleral icterus.    Pupils: Pupils are equal, round, and reactive to light.  Cardiovascular:     Rate and Rhythm: Normal rate and regular rhythm.     Pulses: Normal pulses.     Heart sounds: Normal heart sounds.  Pulmonary:     Effort: Pulmonary effort is normal.     Breath sounds: Normal breath sounds.  Abdominal:     General: Bowel sounds are  normal.     Palpations: Abdomen is soft.     Tenderness: There is no abdominal tenderness.  Musculoskeletal:     Cervical back: Normal range of motion.     Right lower leg: No edema.     Left lower leg: No edema.  Lymphadenopathy:     Cervical: No cervical adenopathy.  Skin:    General: Skin is warm and dry.  Neurological:     Mental Status: She is alert and oriented to person, place, and time.     Comments: NO  NEW FOCAL DEFICITS  Psychiatric:        Mood and Affect: Mood normal.     Comments: NORMAL AFFECT        Assessment & Plan:

## 2019-07-18 NOTE — Patient Instructions (Addendum)
STRICTLY AVOID ASPIRIN, BC/GOODY POWDERS, IBUPROFEN/MOTRIN, OR NAPROXEN/ALEVE UNLESS IT'S MEDICALLY NECESSARY. THESE MEDS WILL INCREASE THE RISK OF BLEEDING FROM DIVERTICULOSIS.    CHECK LIVER PANEL ONCE A YEAR.  AN ALTERNATIVE PRIMARY PROVIDER IS DR. Benny Lennert, Address: [address] [place], [place] [address], Phone: [phone].    EAT TO LIVE AND THINK OF FOOD AS MEDICINE. 75% OF YOUR PLATE SHOULD BE FRUITS/VEGGIES.  To have more energy, and to lose weight:      1. CONTINUE YOUR WEIGHT LOSS EFFORTS. I RECOMMEND YOU READ AND FOLLOW RECOMMENDATIONS BY DR. MARK HYMAN, "10-DAY DETOX DIET".    2. If you must eat bread, EAT EZEKIEL BREAD. IT IS IN THE FROZEN SECTION OF THE GROCERY STORE.    3. DRINK WATER WITH FRUIT OR CUCUMBER ADDED. YOUR URINE SHOULD BE LIGHT YELLOW. AVOID SODA, GATORADE, ENERGY DRINKS, OR DIET SODA.     4. AVOID HIGH FRUCTOSE CORN SYRUP AND CAFFEINE.     5. DO NOT chew SUGAR FREE GUM OR USE ARTIFICIAL SWEETENERS. IF NEEDED USE STEVIA AS A SWEETENER.    6. DO NOT EAT ENRICHED WHEAT FLOUR, PASTA, RICE, OR CEREAL.    7. ONLY EAT WILD CAUGHT SEAFOOD, GRASS FED BEEF OR CHICKEN, PORK FROM PASTURE RAISE PIGS, OR EGGS FROM PASTURE RAISED CHICKENS.    8. PRACTICE CHAIR YOGA FOR 15-30 MINS 3 OR 4 TIMES A WEEK AND PROGRESS TO HATHA YOGA OVER NEXT 6 MOS.    9. START TAKING VITAMIN B12 DAILY.    10. CONTINUE MULTIVITAMIN  AND VITAMIN D3 2000 IU DAILY.   ADDITIONAL SUPPLEMENTS TO DECREASE CRAVING AND SUPPRESS YOUR APPETITE:    1. CINNAMON 500 MG EVERY AM PRIOR TO FIRST MEAL.   **STABILIZES BLOOD GLUCOSE/REDUCES CRAVINGS**    2. CHROMIUM 400-500 MG WITH MEALS TWICE DAILY.    **FAT BURNER**    3. GREEN TEA EXTRACT ONE DAILY.   **FAT BURNER/SUPPRESSES YOUR APPETITE**    4. ALPHA LIPOIC ACID TWICE DAILY.   **NATURAL ANTI-INFLAMMATORY SUPPLEMENT THAT IS AN ALTERNATIVE TO IBUPROFEN OR NAPROXEN**

## 2019-07-19 LAB — HEPATIC FUNCTION PANEL
ALT: 20 IU/L (ref 0–32)
AST: 19 IU/L (ref 0–40)
Albumin: 4 g/dL (ref 3.7–4.7)
Alkaline Phosphatase: 55 IU/L (ref 39–117)
Bilirubin Total: 0.2 mg/dL (ref 0.0–1.2)
Bilirubin, Direct: 0.1 mg/dL (ref 0.00–0.40)
Total Protein: 7.6 g/dL (ref 6.0–8.5)

## 2019-07-23 DIAGNOSIS — Z20828 Contact with and (suspected) exposure to other viral communicable diseases: Secondary | ICD-10-CM | POA: Diagnosis not present

## 2019-09-02 DIAGNOSIS — E7849 Other hyperlipidemia: Secondary | ICD-10-CM | POA: Diagnosis not present

## 2019-09-02 DIAGNOSIS — E119 Type 2 diabetes mellitus without complications: Secondary | ICD-10-CM | POA: Diagnosis not present

## 2019-09-02 DIAGNOSIS — I10 Essential (primary) hypertension: Secondary | ICD-10-CM | POA: Diagnosis not present

## 2019-09-03 ENCOUNTER — Emergency Department: Payer: Medicare HMO

## 2019-09-03 ENCOUNTER — Emergency Department
Admission: EM | Admit: 2019-09-03 | Discharge: 2019-09-03 | Disposition: A | Discharge status: Home / Self Care | Payer: Medicare HMO | Attending: Emergency Medicine | Admitting: Emergency Medicine

## 2019-09-03 ENCOUNTER — Encounter: Payer: Self-pay | Admitting: Emergency Medicine

## 2019-09-03 ENCOUNTER — Other Ambulatory Visit: Payer: Self-pay

## 2019-09-03 DIAGNOSIS — K5792 Diverticulitis of intestine, part unspecified, without perforation or abscess without bleeding: Secondary | ICD-10-CM

## 2019-09-03 DIAGNOSIS — R1084 Generalized abdominal pain: Secondary | ICD-10-CM | POA: Diagnosis not present

## 2019-09-03 DIAGNOSIS — Z7984 Long term (current) use of oral hypoglycemic drugs: Secondary | ICD-10-CM | POA: Insufficient documentation

## 2019-09-03 DIAGNOSIS — Z79899 Other long term (current) drug therapy: Secondary | ICD-10-CM | POA: Insufficient documentation

## 2019-09-03 DIAGNOSIS — I10 Essential (primary) hypertension: Secondary | ICD-10-CM | POA: Diagnosis not present

## 2019-09-03 DIAGNOSIS — E119 Type 2 diabetes mellitus without complications: Secondary | ICD-10-CM | POA: Insufficient documentation

## 2019-09-03 DIAGNOSIS — R52 Pain, unspecified: Secondary | ICD-10-CM | POA: Diagnosis not present

## 2019-09-03 DIAGNOSIS — R55 Syncope and collapse: Secondary | ICD-10-CM | POA: Diagnosis not present

## 2019-09-03 DIAGNOSIS — K5732 Diverticulitis of large intestine without perforation or abscess without bleeding: Secondary | ICD-10-CM | POA: Insufficient documentation

## 2019-09-03 DIAGNOSIS — R42 Dizziness and giddiness: Secondary | ICD-10-CM | POA: Diagnosis not present

## 2019-09-03 DIAGNOSIS — Z87891 Personal history of nicotine dependence: Secondary | ICD-10-CM | POA: Diagnosis not present

## 2019-09-03 DIAGNOSIS — R109 Unspecified abdominal pain: Secondary | ICD-10-CM | POA: Diagnosis not present

## 2019-09-03 LAB — URINALYSIS, COMPLETE (UACMP) WITH MICROSCOPIC
Bilirubin Urine: NEGATIVE
Glucose, UA: 50 mg/dL — AB
Hgb urine dipstick: NEGATIVE
Ketones, ur: NEGATIVE mg/dL
Leukocytes,Ua: NEGATIVE
Nitrite: NEGATIVE
Protein, ur: NEGATIVE mg/dL
Specific Gravity, Urine: 1.023 (ref 1.005–1.030)
pH: 6 (ref 5.0–8.0)

## 2019-09-03 LAB — CBC
HCT: 40.8 % (ref 36.0–46.0)
Hemoglobin: 12.6 g/dL (ref 12.0–15.0)
MCH: 26 pg (ref 26.0–34.0)
MCHC: 30.9 g/dL (ref 30.0–36.0)
MCV: 84.3 fL (ref 80.0–100.0)
Platelets: 206 10*3/uL (ref 150–400)
RBC: 4.84 MIL/uL (ref 3.87–5.11)
RDW: 13.4 % (ref 11.5–15.5)
WBC: 13.7 10*3/uL — ABNORMAL HIGH (ref 4.0–10.5)
nRBC: 0 % (ref 0.0–0.2)

## 2019-09-03 LAB — BASIC METABOLIC PANEL
Anion gap: 9 (ref 5–15)
BUN: 15 mg/dL (ref 8–23)
CO2: 26 mmol/L (ref 22–32)
Calcium: 9.4 mg/dL (ref 8.9–10.3)
Chloride: 103 mmol/L (ref 98–111)
Creatinine, Ser: 1.01 mg/dL — ABNORMAL HIGH (ref 0.44–1.00)
GFR calc Af Amer: >60 mL/min (ref >60)
GFR calc non Af Amer: 55 mL/min — ABNORMAL LOW (ref >60)
Glucose, Bld: 188 mg/dL — ABNORMAL HIGH (ref 70–99)
Potassium: 3.6 mmol/L (ref 3.5–5.1)
Sodium: 138 mmol/L (ref 135–145)

## 2019-09-03 LAB — TROPONIN I (HIGH SENSITIVITY): Troponin I (High Sensitivity): 4 ng/L (ref <18)

## 2019-09-03 LAB — GLUCOSE, CAPILLARY: Glucose-Capillary: 173 mg/dL — ABNORMAL HIGH (ref 70–99)

## 2019-09-03 MED ORDER — IOHEXOL 300 MG/ML  SOLN
100.0000 mL | Freq: Once | INTRAMUSCULAR | Status: AC | PRN
  Administered 2019-09-03: 17:00:00 100 mL via INTRAVENOUS

## 2019-09-03 MED ORDER — SODIUM CHLORIDE 0.9 % IV BOLUS
500.0000 mL | Freq: Once | INTRAVENOUS | Status: AC
  Administered 2019-09-03: 17:00:00 500 mL via INTRAVENOUS

## 2019-09-03 MED ORDER — AMOXICILLIN-POT CLAVULANATE ER 1000-62.5 MG PO TB12
1.0000 | ORAL_TABLET | Freq: Once | ORAL | Status: AC
  Administered 2019-09-03: 1 via ORAL
  Filled 2019-09-03: qty 1

## 2019-09-03 MED ORDER — AMOXICILLIN-POT CLAVULANATE ER 1000-62.5 MG PO TB12: 1.0000 | ORAL_TABLET | Freq: Two times a day (BID) | ORAL | 0 refills | Status: AC

## 2019-09-03 NOTE — ED Triage Notes (Signed)
Pt in via [facility]; reports syncopal episode while at the grocery store, denies hitting head, reports some one catching her fall.  Complaints of generalized abdominal pain x 2 days w/ emesis, denies diarrhea.

## 2019-09-03 NOTE — ED Provider Notes (Signed)
[facility] Emergency Department Provider Note   ____________________________________________   I have reviewed the triage vital signs and the nursing notes.   HISTORY  Chief Complaint Loss of Consciousness   History limited by: Not Limited   HPI Melanie Welch is a 74 y.o. female who presents to the emergency department today after a syncopal episode.  Patient states she was at a store when she started feeling dizzy.  She felt nauseous.  She says she was able to help lower herself to the ground.  The past couple days she has noticed some left sided abdominal cramping.  It would come and go and get somewhat better after defecation.  She has had similar symptoms in the past when she was diagnosed with diverticular disease.  Patient denies any fevers.  Denies any chest pain. States she thinks that her oral intake might have been decreased recently.   Records reviewed. Per medical record review patient has a history of lower GI bleed.   Past Medical History:  Diagnosis Date  . 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 Diabetes mellitus   . Hypertension   . MGUS (monoclonal gammopathy of unknown significance)   . Proteinuria     Patient Active Problem List   Diagnosis Date Noted  . Fatty liver 07/18/2019  . GIB (gastrointestinal bleeding) 12/10/2018  . Lower GI bleed 12/08/2018  . 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 Diabetes mellitus without complication ([place]) [id number]  . Essential hypertension, benign 05/22/2015  . Palpable thyroid 08/29/2014  . MGUS (monoclonal gammopathy of unknown significance) 08/28/2014    Past Surgical History:  Procedure Laterality Date  . ABDOMINAL HYSTERECTOMY    . BREAST BIOPSY  2005   left  . CATARACT EXTRACTION W/PHACO Right 05/10/2017   Procedure: CATARACT EXTRACTION PHACO AND INTRAOCULAR LENS PLACEMENT (IOC);  Surgeon: Rutherford Guys, MD;  Location: [facility];  Service: Ophthalmology;  Laterality: Right;  CDE: 5.04  .  CATARACT EXTRACTION W/PHACO Left 05/24/2017   Procedure: CATARACT EXTRACTION PHACO AND INTRAOCULAR LENS PLACEMENT (IOC);  Surgeon: Rutherford Guys, MD;  Location: [facility];  Service: Ophthalmology;  Laterality: Left;  CDE: 8.40  . COLONOSCOPY  07/13/2011   Procedure: COLONOSCOPY;  Surgeon: Dorothyann Peng, MD;  [facility];  Service: Endoscopy;  Laterality: N/A;  9:30 AM  . COLONOSCOPY N/A 12/09/2018   diverticulosis, suspect diverticulum in mid sigmoid s/p epi, unable to place clip. 5 mm benign polyp. Colonic lipoma. Normal TI. Many diverticula occluded with stool.   Marland Kitchen POLYPECTOMY  12/09/2018   Procedure: POLYPECTOMY;  Surgeon: Daneil Dolin, MD;  [facility];  Service: Endoscopy;;  splenic flexure    Prior to Admission medications   Medication Sig Start Date End Date Taking? Authorizing Provider  amLODipine (NORVASC) 10 MG tablet Take 10 mg by mouth at bedtime.     [provider]  Cholecalciferol (VITAMIN D3 PO) Take by mouth daily.    [provider]  Coenzyme Q10 (CO Q 10 PO) Take by mouth daily.    [provider]  losartan (COZAAR) 100 MG tablet Take 100 mg by mouth daily.    [provider]  metFORMIN (GLUCOPHAGE) 1000 MG tablet Take 1,000 mg by mouth 2 (two) times daily. 03/11/17   [provider]  Multiple Vitamin (MULTIVITAMIN) tablet Take 1 tablet by mouth daily.    [provider]  Omega-3 Fatty Acids (FISH OIL PO) Take by mouth daily.    [provider]  Polyethyl Glycol-Propyl Glycol (SYSTANE) 0.4-0.3 % SOLN Place 1 drop into both eyes 2 (two) times daily as needed. IN THE MORNING & NOON.     [provider]  vitamin C (ASCORBIC ACID) 500 MG tablet Take 500 mg by mouth 2 (two) times daily.    [provider]    Allergies Lipitor [atorvastatin]  Family History  Problem Relation Age of Onset  . Cancer Mother   . Cancer Sister     Social History Social History   Tobacco Use   . Smoking status: Former Research scientist (life sciences)  . Smokeless tobacco: Never Used  Substance Use Topics  . Alcohol use: Yes  . Drug use: No    Review of Systems Constitutional: No fever/chills Eyes: No visual changes. ENT: No sore throat. Cardiovascular: Denies chest pain. Respiratory: Denies shortness of breath. Gastrointestinal: Positive for abdominal pain. Nausea.  Genitourinary: Negative for dysuria. Musculoskeletal: Negative for back pain. Skin: Negative for rash. Neurological: Positive for lightheadedness.   ____________________________________________   PHYSICAL EXAM:  VITAL SIGNS: ED Triage Vitals  Enc Vitals Group     BP 09/03/19 1240 115/67     Pulse Rate 09/03/19 1240 78     Resp 09/03/19 1240 17     Temp 09/03/19 1240 98.7 F (37.1 C)     Temp Source 09/03/19 1240 Oral     SpO2 09/03/19 1240 100 %     Weight 09/03/19 1243 160 lb (72.6 kg)     Height 09/03/19 1243 5\' 8"  (1.727 m)     Head Circumference --      Peak Flow --      Pain Score 09/03/19 1243 8   Constitutional: Alert and oriented.  Eyes: Conjunctivae are normal.  ENT      Head: Normocephalic and atraumatic.      Nose: No congestion/rhinnorhea.      Mouth/Throat: Mucous membranes are moist.      Neck: No stridor. Hematological/Lymphatic/Immunilogical: No cervical lymphadenopathy. Cardiovascular: Normal rate, regular rhythm.  No murmurs, rubs, or gallops.  Respiratory: Normal respiratory effort without tachypnea nor retractions. Breath sounds are clear and equal bilaterally. No wheezes/rales/rhonchi. Gastrointestinal: Soft and tender to palpation in the left abdomen. Genitourinary: Deferred Musculoskeletal: Normal range of motion in all extremities. No lower extremity edema. Neurologic:  Normal speech and language. No gross focal neurologic deficits are appreciated.  Skin:  Skin is warm, dry and intact. No rash noted. Psychiatric: Mood and affect are normal. Speech and behavior are normal. Patient exhibits  appropriate insight and judgment.  ____________________________________________    LABS (pertinent positives/negatives)  CBC wbc 13.7, hgb 12.6, plt 206 BMP wnl except glu 188, cr 1.01 Trop hs 4 UA clear, glucose 50, rbc and wbc 0-5 ____________________________________________   EKG  I, Nance Pear, attending physician, personally viewed and interpreted this EKG  EKG Time: 1244 Rate: 80 Rhythm: normal sinus rhythm Axis: left axis deviation Intervals: qtc 366 QRS: narrow ST changes: no st elevation Impression: abnormal ekg   ____________________________________________    RADIOLOGY  CT abd/pel Diverticulitis. Complex cyst of the left kidney  ____________________________________________   PROCEDURES  Procedures  ____________________________________________   INITIAL IMPRESSION / ASSESSMENT AND PLAN / ED COURSE  Pertinent labs & imaging results that were available during my care of the patient were reviewed by me and considered in my medical decision making (see chart for details).   Patient presented to the emergency department today because of concerns for a syncopal episode as well as some abdominal  pain and nausea.  On exam patient did have tenderness left side of her abdomen.  She states she has history of diverticular disease in the past.  CT scan does show diverticulitis patient does have a mild leukocytosis.  The patient's troponin was negative.  Patient did feel better after her stay here in the emergency department.  Will give patient first dose of antibiotics tonight. Discussed return precautions with the patient. ____________________________________________   FINAL CLINICAL IMPRESSION(S) / ED DIAGNOSES  Final diagnoses:  Syncope and collapse  Diverticulitis     Note: This dictation was prepared with Dragon dictation. Any transcriptional errors that result from this process are unintentional     Nance Pear, MD 09/03/19 [phone]

## 2019-09-03 NOTE — Discharge Instructions (Addendum)
Please seek medical attention for any high fevers, chest pain, shortness of breath, change in behavior, persistent vomiting, bloody stool or any other new or concerning symptoms.  

## 2019-09-03 NOTE — ED Notes (Signed)
Pt states she is unable to provide urine specimen at this time.

## 2019-09-03 NOTE — ED Notes (Signed)
Pt denies fevers, shob, cough, feeling lightheaded or dizzy. States hx of diverticulosis which lead to her passing out in May.

## 2019-09-26 DIAGNOSIS — Z6824 Body mass index (BMI) 24.0-24.9, adult: Secondary | ICD-10-CM | POA: Diagnosis not present

## 2019-09-26 DIAGNOSIS — Z1389 Encounter for screening for other disorder: Secondary | ICD-10-CM | POA: Diagnosis not present

## 2019-09-26 DIAGNOSIS — E1029 Type 1 diabetes mellitus with other diabetic kidney complication: Secondary | ICD-10-CM | POA: Diagnosis not present

## 2019-09-26 DIAGNOSIS — E7849 Other hyperlipidemia: Secondary | ICD-10-CM | POA: Diagnosis not present

## 2019-09-26 DIAGNOSIS — I10 Essential (primary) hypertension: Secondary | ICD-10-CM | POA: Diagnosis not present

## 2019-09-26 DIAGNOSIS — Z0001 Encounter for general adult medical examination with abnormal findings: Secondary | ICD-10-CM | POA: Diagnosis not present

## 2019-09-30 DIAGNOSIS — E7849 Other hyperlipidemia: Secondary | ICD-10-CM | POA: Diagnosis not present

## 2019-09-30 DIAGNOSIS — E119 Type 2 diabetes mellitus without complications: Secondary | ICD-10-CM | POA: Diagnosis not present

## 2019-09-30 DIAGNOSIS — I10 Essential (primary) hypertension: Secondary | ICD-10-CM | POA: Diagnosis not present

## 2020-01-08 DIAGNOSIS — E049 Nontoxic goiter, unspecified: Secondary | ICD-10-CM | POA: Diagnosis not present

## 2020-01-08 DIAGNOSIS — E538 Deficiency of other specified B group vitamins: Secondary | ICD-10-CM | POA: Diagnosis not present

## 2020-01-08 DIAGNOSIS — E7849 Other hyperlipidemia: Secondary | ICD-10-CM | POA: Diagnosis not present

## 2020-01-08 DIAGNOSIS — E663 Overweight: Secondary | ICD-10-CM | POA: Diagnosis not present

## 2020-01-08 DIAGNOSIS — E119 Type 2 diabetes mellitus without complications: Secondary | ICD-10-CM | POA: Diagnosis not present

## 2020-01-08 DIAGNOSIS — Z23 Encounter for immunization: Secondary | ICD-10-CM | POA: Diagnosis not present

## 2020-01-08 DIAGNOSIS — E559 Vitamin D deficiency, unspecified: Secondary | ICD-10-CM | POA: Diagnosis not present

## 2020-01-08 DIAGNOSIS — I10 Essential (primary) hypertension: Secondary | ICD-10-CM | POA: Diagnosis not present

## 2020-01-08 DIAGNOSIS — Z6825 Body mass index (BMI) 25.0-25.9, adult: Secondary | ICD-10-CM | POA: Diagnosis not present

## 2020-01-22 DIAGNOSIS — Z961 Presence of intraocular lens: Secondary | ICD-10-CM | POA: Diagnosis not present

## 2020-01-22 DIAGNOSIS — E119 Type 2 diabetes mellitus without complications: Secondary | ICD-10-CM | POA: Diagnosis not present

## 2020-01-22 DIAGNOSIS — H524 Presbyopia: Secondary | ICD-10-CM | POA: Diagnosis not present

## 2020-01-22 DIAGNOSIS — Z0100 Encounter for examination of eyes and vision without abnormal findings: Secondary | ICD-10-CM | POA: Diagnosis not present

## 2020-02-26 DIAGNOSIS — E663 Overweight: Secondary | ICD-10-CM | POA: Diagnosis not present

## 2020-02-26 DIAGNOSIS — N342 Other urethritis: Secondary | ICD-10-CM | POA: Diagnosis not present

## 2020-02-26 DIAGNOSIS — Z6825 Body mass index (BMI) 25.0-25.9, adult: Secondary | ICD-10-CM | POA: Diagnosis not present

## 2020-02-26 DIAGNOSIS — L989 Disorder of the skin and subcutaneous tissue, unspecified: Secondary | ICD-10-CM | POA: Diagnosis not present

## 2020-02-29 DIAGNOSIS — E7849 Other hyperlipidemia: Secondary | ICD-10-CM | POA: Diagnosis not present

## 2020-02-29 DIAGNOSIS — E119 Type 2 diabetes mellitus without complications: Secondary | ICD-10-CM | POA: Diagnosis not present

## 2020-02-29 DIAGNOSIS — I10 Essential (primary) hypertension: Secondary | ICD-10-CM | POA: Diagnosis not present

## 2020-05-01 DIAGNOSIS — E7849 Other hyperlipidemia: Secondary | ICD-10-CM | POA: Diagnosis not present

## 2020-05-01 DIAGNOSIS — I10 Essential (primary) hypertension: Secondary | ICD-10-CM | POA: Diagnosis not present

## 2020-05-01 DIAGNOSIS — E119 Type 2 diabetes mellitus without complications: Secondary | ICD-10-CM | POA: Diagnosis not present

## 2020-05-16 DIAGNOSIS — E876 Hypokalemia: Secondary | ICD-10-CM | POA: Diagnosis not present

## 2020-05-16 DIAGNOSIS — Z6824 Body mass index (BMI) 24.0-24.9, adult: Secondary | ICD-10-CM | POA: Diagnosis not present

## 2020-05-16 DIAGNOSIS — E7849 Other hyperlipidemia: Secondary | ICD-10-CM | POA: Diagnosis not present

## 2020-05-16 DIAGNOSIS — E559 Vitamin D deficiency, unspecified: Secondary | ICD-10-CM | POA: Diagnosis not present

## 2020-05-16 DIAGNOSIS — E049 Nontoxic goiter, unspecified: Secondary | ICD-10-CM | POA: Diagnosis not present

## 2020-05-16 DIAGNOSIS — Z23 Encounter for immunization: Secondary | ICD-10-CM | POA: Diagnosis not present

## 2020-05-30 DIAGNOSIS — L821 Other seborrheic keratosis: Secondary | ICD-10-CM | POA: Diagnosis not present

## 2020-05-30 DIAGNOSIS — L84 Corns and callosities: Secondary | ICD-10-CM | POA: Diagnosis not present

## 2020-05-30 DIAGNOSIS — L819 Disorder of pigmentation, unspecified: Secondary | ICD-10-CM | POA: Diagnosis not present

## 2020-05-30 DIAGNOSIS — L918 Other hypertrophic disorders of the skin: Secondary | ICD-10-CM | POA: Diagnosis not present

## 2020-06-25 ENCOUNTER — Encounter: Payer: Self-pay | Admitting: Internal Medicine

## 2020-07-01 DIAGNOSIS — E119 Type 2 diabetes mellitus without complications: Secondary | ICD-10-CM | POA: Diagnosis not present

## 2020-07-01 DIAGNOSIS — I10 Essential (primary) hypertension: Secondary | ICD-10-CM | POA: Diagnosis not present

## 2020-07-01 DIAGNOSIS — E7849 Other hyperlipidemia: Secondary | ICD-10-CM | POA: Diagnosis not present

## 2020-07-01 DIAGNOSIS — D4102 Neoplasm of uncertain behavior of left kidney: Secondary | ICD-10-CM | POA: Diagnosis not present

## 2020-08-01 DIAGNOSIS — I10 Essential (primary) hypertension: Secondary | ICD-10-CM | POA: Diagnosis not present

## 2020-08-01 DIAGNOSIS — E119 Type 2 diabetes mellitus without complications: Secondary | ICD-10-CM | POA: Diagnosis not present

## 2020-08-01 DIAGNOSIS — E7849 Other hyperlipidemia: Secondary | ICD-10-CM | POA: Diagnosis not present

## 2020-08-20 DIAGNOSIS — N342 Other urethritis: Secondary | ICD-10-CM | POA: Diagnosis not present

## 2020-08-20 DIAGNOSIS — Z6825 Body mass index (BMI) 25.0-25.9, adult: Secondary | ICD-10-CM | POA: Diagnosis not present

## 2020-08-20 DIAGNOSIS — R319 Hematuria, unspecified: Secondary | ICD-10-CM | POA: Diagnosis not present

## 2020-08-20 DIAGNOSIS — E1029 Type 1 diabetes mellitus with other diabetic kidney complication: Secondary | ICD-10-CM | POA: Diagnosis not present

## 2020-08-21 NOTE — Progress Notes (Signed)
Unable to connect with patient for virtual visit today. No answer on home phone or cell. No VM available.   No charge.  Aliene Altes, PA-C [facility]

## 2020-08-22 ENCOUNTER — Other Ambulatory Visit: Payer: Self-pay

## 2020-08-22 ENCOUNTER — Telehealth (INDEPENDENT_AMBULATORY_CARE_PROVIDER_SITE_OTHER): Payer: Medicare HMO | Admitting: Gastroenterology

## 2020-08-22 ENCOUNTER — Telehealth: Payer: Self-pay | Admitting: Gastroenterology

## 2020-08-22 DIAGNOSIS — K922 Gastrointestinal hemorrhage, unspecified: Secondary | ICD-10-CM

## 2020-08-22 DIAGNOSIS — K760 Fatty (change of) liver, not elsewhere classified: Secondary | ICD-10-CM

## 2020-08-22 NOTE — Telephone Encounter (Signed)
Melanie Welch, I was unable to connect with patient for virtual visit on 08/22/2020.  No answer on home phone or cell and unable to leave voicemail.  Please send letter requesting patient to call to reschedule.

## 2020-08-25 NOTE — Telephone Encounter (Signed)
Noted  

## 2020-08-25 NOTE — Telephone Encounter (Signed)
Patient called and got rescheduled

## 2020-08-30 DIAGNOSIS — E119 Type 2 diabetes mellitus without complications: Secondary | ICD-10-CM | POA: Diagnosis not present

## 2020-08-30 DIAGNOSIS — E7849 Other hyperlipidemia: Secondary | ICD-10-CM | POA: Diagnosis not present

## 2020-08-30 DIAGNOSIS — I10 Essential (primary) hypertension: Secondary | ICD-10-CM | POA: Diagnosis not present

## 2020-09-01 DIAGNOSIS — D72829 Elevated white blood cell count, unspecified: Secondary | ICD-10-CM | POA: Diagnosis not present

## 2020-09-01 DIAGNOSIS — N390 Urinary tract infection, site not specified: Secondary | ICD-10-CM | POA: Diagnosis not present

## 2020-09-19 DIAGNOSIS — Z1389 Encounter for screening for other disorder: Secondary | ICD-10-CM | POA: Diagnosis not present

## 2020-09-19 DIAGNOSIS — E559 Vitamin D deficiency, unspecified: Secondary | ICD-10-CM | POA: Diagnosis not present

## 2020-09-19 DIAGNOSIS — E1029 Type 1 diabetes mellitus with other diabetic kidney complication: Secondary | ICD-10-CM | POA: Diagnosis not present

## 2020-09-19 DIAGNOSIS — Z0000 Encounter for general adult medical examination without abnormal findings: Secondary | ICD-10-CM | POA: Diagnosis not present

## 2020-09-19 DIAGNOSIS — Z6825 Body mass index (BMI) 25.0-25.9, adult: Secondary | ICD-10-CM | POA: Diagnosis not present

## 2020-09-19 DIAGNOSIS — Z1331 Encounter for screening for depression: Secondary | ICD-10-CM | POA: Diagnosis not present

## 2020-09-19 DIAGNOSIS — N390 Urinary tract infection, site not specified: Secondary | ICD-10-CM | POA: Diagnosis not present

## 2020-09-19 DIAGNOSIS — E7849 Other hyperlipidemia: Secondary | ICD-10-CM | POA: Diagnosis not present

## 2020-09-19 DIAGNOSIS — E538 Deficiency of other specified B group vitamins: Secondary | ICD-10-CM | POA: Diagnosis not present

## 2020-09-29 DIAGNOSIS — E7849 Other hyperlipidemia: Secondary | ICD-10-CM | POA: Diagnosis not present

## 2020-09-29 DIAGNOSIS — I10 Essential (primary) hypertension: Secondary | ICD-10-CM | POA: Diagnosis not present

## 2020-09-29 DIAGNOSIS — E119 Type 2 diabetes mellitus without complications: Secondary | ICD-10-CM | POA: Diagnosis not present

## 2020-10-02 NOTE — Progress Notes (Signed)
Referring Provider: [name], MD Primary Care Physician:  [name], MD Primary GI Physician: Dr. Abbey Chatters  Chief Complaint  Patient presents with  . fattyt liver    F/u. Wants to discuss if she can take turmeric  . Diarrhea    With certain foods like sugars, salads    HPI:   Melanie Welch is a 75 y.o. female presenting today with a history of fatty liver and acute blood loss anemia secondary to diverticular bleed in May 2020 s/p colonoscopy during hospitalization revealing pancolonic diverticula, epinephrine injected into a large diverticula in the mid sigmoid colon.  There was also a 5 mm polyp in the splenic flexure.  Pathology with tubular adenoma. No recommendations to repeat colonoscopy due to age.   Patient was last seen December 2020 and was clinically doing well at that time.  Recommended avoiding all NSAID products and liver panel yearly due to fatty liver.  CT abdomen and pelvis with contrast 09/03/2019 for left-sided abdominal pain x2 days.  There was question of a small focal segment of sigmoid colon with mild bowel wall thickening and mesenteric fat stranding changes.  Questioned mild focal sigmoid colonic diverticulitis.  Also with unchanged complex cyst in the lower pole of the left kidney measuring 2.5 cm, further evaluation with pre and postcontrast MRI or CT should be considered.  Today:   Fatty liver: Has routine blood work with PCP. Weight is stable. Has been around 165 for a long time now. Would like to lose about 20 lbs. Last A1C was 7.2. States it will come down if she exercises more. Watching her diet. Limiting fried/fatty foods. Occasional soda. No regular sweets. Drinks black coffee, about 1 cup a day.  Denies swelling in the abdomen or lower extremities, confusion or changes in mental status, yellowing of the eyes, or easy bruising.  Last abdominal imaging was CT abdomen pelvis in February 2021 with diffuse low density throughout the liver  parenchyma.  Loose stools:  When eating sugary items or lettuce, she has loose stools. Just 1 large loose BM. As long as she avoids these items, her are normal.  Typically with 1 soft, formed BM daily. No blood in the stool or black stool.   No GERD symptoms, dysphagia, nausea, vomiting, or abdominal pain.   Past Medical History:  Diagnosis Date  . 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 Diabetes mellitus   . Fatty liver   . Hypertension   . MGUS (monoclonal gammopathy of unknown significance)   . Proteinuria     Past Surgical History:  Procedure Laterality Date  . ABDOMINAL HYSTERECTOMY    . BREAST BIOPSY  2005   left  . CATARACT EXTRACTION W/PHACO Right 05/10/2017   Procedure: CATARACT EXTRACTION PHACO AND INTRAOCULAR LENS PLACEMENT (IOC);  Surgeon: Rutherford Guys, MD;  Location: [facility];  Service: Ophthalmology;  Laterality: Right;  CDE: 5.04  . CATARACT EXTRACTION W/PHACO Left 05/24/2017   Procedure: CATARACT EXTRACTION PHACO AND INTRAOCULAR LENS PLACEMENT (IOC);  Surgeon: Rutherford Guys, MD;  Location: [facility];  Service: Ophthalmology;  Laterality: Left;  CDE: 8.40  . COLONOSCOPY  07/13/2011   Procedure: COLONOSCOPY;  Surgeon: Dorothyann Peng, MD;  Location: [facility];  Service: Endoscopy;  Laterality: N/A;  9:30 AM  . COLONOSCOPY N/A 12/09/2018   diverticulosis, suspect diverticulum in mid sigmoid s/p epi, unable to place clip. 5 mm benign polyp. Colonic lipoma. Normal TI. Many diverticula occluded with stool.   Marland Kitchen POLYPECTOMY  12/09/2018   Procedure: POLYPECTOMY;  Surgeon: Daneil Dolin, MD;  Location: [facility];  Service: Endoscopy;;  splenic flexure    Current Outpatient Medications  Medication Sig Dispense Refill  . amLODipine (NORVASC) 10 MG tablet Take 10 mg by mouth at bedtime.     . Cholecalciferol (VITAMIN D3 PO) Take by mouth daily.    . Coenzyme Q10 (CO Q 10 PO) Take by mouth daily.    . Cyanocobalamin (B-12 PO) Take by mouth daily.    Marland Kitchen losartan (COZAAR) 100 MG  tablet Take 100 mg by mouth daily.    . metFORMIN (GLUCOPHAGE) 1000 MG tablet Take 1,000 mg by mouth daily with breakfast.  0  . Omega-3 Fatty Acids (FISH OIL PO) Take by mouth daily.    Vladimir Faster Glycol-Propyl Glycol 0.4-0.3 % SOLN Place 1 drop into both eyes 2 (two) times daily as needed. IN THE MORNING & NOON.    . vitamin C (ASCORBIC ACID) 500 MG tablet Take 500 mg by mouth daily.     No current facility-administered medications for this visit.    Allergies as of 10/03/2020 - Review Complete 10/03/2020  Allergen Reaction Noted  . Lipitor [atorvastatin] Nausea And Vomiting 07/18/2019    Family History  Problem Relation Age of Onset  . Cancer Mother   . Cancer Sister   . Colon cancer Neg Hx     Social History   Socioeconomic History  . Marital status: Divorced    Spouse name: Not on file  . Number of children: Not on file  . Years of education: Not on file  . Highest education level: Not on file  Occupational History  . Not on file  Tobacco Use  . Smoking status: Former Research scientist (life sciences)  . Smokeless tobacco: Never Used  Vaping Use  . Vaping Use: Never used  Substance and Sexual Activity  . Alcohol use: Yes    Comment: rare wine  . Drug use: No  . Sexual activity: Not Currently  Other Topics Concern  . Not on file  Social History Narrative  . Not on file   Social Determinants of Health   Financial Resource Strain: Not on file  Food Insecurity: Not on file  Transportation Needs: Not on file  Physical Activity: Not on file  Stress: Not on file  Social Connections: Not on file    Review of Systems: Gen: Denies fever, chills, cold or flulike symptoms, lightheadedness, dizziness, presyncope, syncope. CV: Denies chest pain or palpitations. Resp: Denies dyspnea or cough. GI: See HPI Heme: See HPI  Physical Exam: BP 134/81   Pulse 82   Temp (!) 97.1 F (36.2 C)   Ht 5\' 8"  (1.727 m)   Wt 165 lb 12.8 oz (75.2 kg)   BMI 25.21 kg/m  General:   Alert and oriented.  No distress noted. Pleasant and cooperative.  Head:  Normocephalic and atraumatic. Eyes:  Conjuctiva clear without scleral icterus. Heart:  S1, S2 present without murmurs appreciated. Lungs:  Clear to auscultation bilaterally. No wheezes, rales, or rhonchi. No distress.  Abdomen:  +BS, soft, non-tender and non-distended. No rebound or guarding. No HSM or masses noted. Msk:  Symmetrical without gross deformities. Normal posture. Extremities:  Without edema. Neurologic:  Alert and  oriented x4 Psych:  Normal mood and affect.   Assessment: 75 year old female with history of fatty liver, diverticulosis with diverticular bleed in May 2020 s/p epinephrine injection to control bleed and single tubular adenoma removed at that time with no  recommendations to repeat colonoscopy due to age.  She is presenting today for follow-up of fatty liver and also reports intermittent loose stools.  Fatty liver: No signs or symptoms of decompensated liver disease.  Weight has remained stable with BMI 25.  Reports last A1c was 7.2.  No history of elevated LFTs.  Reports having routine blood work with PCP.  She is limiting fried/fatty foods and sweets.  Rare wine. Last abdominal imaging was CT A/P in February 2021 with diffuse low density throughout the liver parenchyma, no focal liver lesions or nodularity.  Loose stools: Patient reports having single loose BM if consuming sugary items or lettuce.  Otherwise, with 1 soft, formed BM daily.  No alarm symptoms.  Likely secondary to dietary intolerance.   Plan: 1.  Counseled on fatty liver.   Recommend 1-2# weight loss per week until ideal body weight through exercise & diet.  In general, goal of 10% weight loss.  Low fat/cholesterol diet.    Avoid sweets, sodas, fruit juices, sweetened beverages like tea, etc.  Gradually increase exercise from 15 min daily up to 1 hr per day 5 days/week.  Continue to limit alcohol use.  May increase black coffee as tolerated. 2.   Request labs from PCP. 3.  Continue to avoid sugary items and lettuce and monitor BMs.  Advised to let us know if loose stools return despite avoiding these items.  4.  Discussed adding Metamucil daily to help with stool consistency and form with history of diverticulosis. 5.  Discussed following up as needed versus yearly.  Patient request to follow-up yearly.   Aliene Altes, PA-C [facility] 10/03/2020

## 2020-10-03 ENCOUNTER — Other Ambulatory Visit: Payer: Self-pay

## 2020-10-03 ENCOUNTER — Encounter: Payer: Self-pay | Admitting: Gastroenterology

## 2020-10-03 ENCOUNTER — Ambulatory Visit (INDEPENDENT_AMBULATORY_CARE_PROVIDER_SITE_OTHER): Payer: Medicare HMO | Admitting: Gastroenterology

## 2020-10-03 VITALS — BP 134/81 | HR 82 | Temp 97.1°F | Ht 68.0 in | Wt 165.8 lb

## 2020-10-03 DIAGNOSIS — K760 Fatty (change of) liver, not elsewhere classified: Secondary | ICD-10-CM

## 2020-10-03 DIAGNOSIS — R195 Other fecal abnormalities: Secondary | ICD-10-CM

## 2020-10-03 NOTE — Patient Instructions (Addendum)
Instructions for fatty liver: Recommend 1-2# weight loss per week until ideal body weight through exercise & diet. In general 10% weight loss is a good goal to set.  Low fat/cholesterol diet.   Avoid sweets, sodas, fruit juices, sweetened beverages like tea, etc. Gradually increase exercise from 15 min daily up to 1 hr per day 5 days/week. Continue to limit alcohol use. As we discussed, black coffee is also beneficial in setting of fatty liver.  You may increase your coffee intake as tolerated.  I am requesting your recent blood work from Dr. Hilma Favors and will let you know if I have any additional recommendations.  Continue to avoid sugary items and let us as these calls loose stools.  If you have return of frequent loose stools even with avoiding these items, please let me know.  You may also add Metamucil daily.  We will plan to see back in 1 year.  Do not hesitate to call if you have any questions or concerns.  It was good to meet you today!  Aliene Altes, PA-C [facility]   Nonalcoholic Fatty Liver Disease Diet, Adult Nonalcoholic fatty liver disease is a condition that causes fat to build up in and around the liver. The disease makes it harder for the liver to work the way that it should. Following a healthy diet can help to keep nonalcoholic fatty liver disease under control. It can also help to prevent or improve conditions that are associated with the disease, such as heart disease, diabetes, high blood pressure, and abnormal cholesterol levels. Along with regular exercise, this diet:  Promotes weight loss.  Helps to control blood sugar levels.  Helps to improve the way that the body uses insulin. What are tips for following this plan? Reading food labels Always check food labels for:  The amount of saturated fat in a food. You should limit your intake of saturated fat. Saturated fat is found in foods that come from animals, including meat and dairy  products such as butter, cheese, and whole milk.  The amount of fiber in a food. You should choose high-fiber foods such as fruits, vegetables, and whole grains. Try to get 25-30 grams (g) of fiber a day.   Cooking  When cooking, use heart-healthy oils that are high in monounsaturated fats. These include olive oil, canola oil, and avocado oil.  Limit frying or deep-frying foods. Cook foods using healthy methods such as baking, boiling, steaming, and grilling instead. Meal planning  You may want to keep track of how many calories you take in. Eating the right amount of calories will help you achieve a healthy weight. Meeting with a registered dietitian can help you get started.  Limit how often you eat takeout and fast food. These foods are usually very high in fat, salt, and sugar.  Use the glycemic index (GI) to plan your meals. The index tells you how quickly a food will raise your blood sugar. Choose low-GI foods (GI less than 55). These foods take a longer time to raise blood sugar. A registered dietitian can help you identify foods lower on the GI scale. Lifestyle  You may want to follow a Mediterranean diet. This diet includes a lot of vegetables, lean meats or fish, whole grains, fruits, and healthy oils and fats. What foods can I eat? Fruits Bananas. Apples. Oranges. Grapes. Papaya. Mango. Pomegranate. Kiwi. Grapefruit. Cherries. Vegetables Lettuce. Spinach. Peas. Beets. Cauliflower. Cabbage. Broccoli. Carrots. Tomatoes. Squash. Eggplant. Herbs. Peppers. Onions. Cucumbers. Brussels sprouts. Yams  and sweet potatoes. Beans. Lentils. Grains Whole wheat or whole-grain foods, including breads, crackers, cereals, and pasta. Stone-ground whole wheat. Unsweetened oatmeal. Bulgur. Barley. Quinoa. Brown or wild rice. Corn or whole wheat flour tortillas. Meats and other proteins Lean meats. Poultry. Tofu. Seafood and shellfish. Dairy Low-fat or fat-free dairy products, such as yogurt,  cottage cheese, or cheese. Beverages Water. Sugar-free drinks. Tea. Coffee. Low-fat or skim milk. Milk alternatives, such as soy or almond milk. Real fruit juice. Fats and oils Avocado. Canola or olive oil. Nuts and nut butters. Seeds. Seasonings and condiments Mustard. Relish. Low-fat, low-sugar ketchup and barbecue sauce. Low-fat or fat-free mayonnaise. Sweets and desserts Sugar-free sweets. The items listed above may not be a complete list of foods and beverages you can eat. Contact a dietitian for more information.   What foods should I limit or avoid? Meats and other proteins Limit red meat to 1-2 times a week. Dairy [employer]. Fats and oils Palm oil and coconut oil. Fried foods. Other foods Processed foods. Foods that contain a lot of salt or sodium. Sweets and desserts Sweets that contain sugar. Beverages Sweetened drinks, such as sweet tea, milkshakes, iced sweet drinks, and sodas. Alcohol. The items listed above may not be a complete list of foods and beverages you should avoid. Contact a dietitian for more information. Where to find more information The [employer] of Diabetes and Digestive and Kidney Diseases: [employer] Summary  Nonalcoholic fatty liver disease is a condition that causes fat to build up in and around the liver.  Following a healthy diet can help to keep nonalcoholic fatty liver disease under control. Your diet should be rich in fruits, vegetables, whole grains, and lean proteins.  Limit your intake of saturated fat. Saturated fat is found in foods that come from animals, including meat and dairy products such as butter, cheese, and whole milk.  This diet promotes weight loss, helps to control blood sugar levels, and helps to improve the way that the body uses insulin. This information is not intended to replace advice given to you by your health care provider. Make sure you discuss any questions you have with your health care  provider. Document Revised: 11/10/2018 Document Reviewed: 08/10/2018 Elsevier Patient Education  [place].

## 2020-10-07 ENCOUNTER — Telehealth: Payer: Self-pay | Admitting: Gastroenterology

## 2020-10-07 NOTE — Telephone Encounter (Signed)
Attempted to call the pt and something going on with her phone so I will try again tomorrow to contact her.

## 2020-10-07 NOTE — Telephone Encounter (Signed)
Received and reviewed labs completed with PCP 09/19/2020.  CBC: WBC 4.7, hemoglobin 12.4, hematocrit 37.3, MCV 81, MCH 27.0, MCHC 33.2, platelets 223 CMP: Glucose 131 (H), BUN 9, creatinine 0.95, sodium 142, potassium 4.3, chloride 104, calcium 9.0, total protein 7.7, BUN 3.9, total bilirubin 0.4, alk phos 55, AST 17, ALT 21 Cholesterol panel: Total cholesterol 212 (H), triglycerides 134, HDL 41, LDL 147 (H) TSH 0.680 Vitamin B12: 4 7 Vitamin D, 25: 32.5 Hemoglobin A1c: 7.2  No additional recommendations at this time. We will have them scanned into her chart.  Dena: Please let patient know I have received and reviewed recent labs completed with PCP.  LFTs have remained normal.  Her total cholesterol and LDL (bad cholesterol) are a little high.  She should discuss this with her primary care provider.  Otherwise, she should continue with recommendations made at her office visit for fatty liver.

## 2020-10-08 NOTE — Telephone Encounter (Signed)
Phoned the pt's home number , vm has not been established.  2 phoned pt's cell LM for this pt to return call regarding labs and instructions.

## 2020-10-13 NOTE — Telephone Encounter (Signed)
Letter sent to the pt

## 2020-10-13 NOTE — Progress Notes (Signed)
Dear Melanie Welch,   We have left you several messages trying to contact you regarding your labs. Can you please call our office if you have not gotten the results from your PCP Dr.     Lujean Rave you, Floria Raveling, CMA

## 2020-10-20 ENCOUNTER — Telehealth: Payer: Self-pay | Admitting: Internal Medicine

## 2020-10-20 NOTE — Telephone Encounter (Signed)
The pt returned call was advised of her lab results and her instructions to discuss with PCP and recommendations for a fatty liver. Pt states she has already discussed with her PCP. (Dr. Hilma Favors)

## 2020-10-20 NOTE — Telephone Encounter (Signed)
Returned the pt's call, no answer and her vm has not been set up

## 2020-10-20 NOTE — Telephone Encounter (Signed)
Patient received letter and returned call  Please call her back on the cell at [phone]

## 2020-10-29 DIAGNOSIS — E119 Type 2 diabetes mellitus without complications: Secondary | ICD-10-CM | POA: Diagnosis not present

## 2020-10-29 DIAGNOSIS — I10 Essential (primary) hypertension: Secondary | ICD-10-CM | POA: Diagnosis not present

## 2020-10-29 DIAGNOSIS — E7849 Other hyperlipidemia: Secondary | ICD-10-CM | POA: Diagnosis not present

## 2020-11-28 ENCOUNTER — Other Ambulatory Visit (HOSPITAL_COMMUNITY): Payer: Self-pay | Admitting: Family Medicine

## 2020-11-28 DIAGNOSIS — Z1231 Encounter for screening mammogram for malignant neoplasm of breast: Secondary | ICD-10-CM

## 2020-12-01 DIAGNOSIS — L989 Disorder of the skin and subcutaneous tissue, unspecified: Secondary | ICD-10-CM | POA: Diagnosis not present

## 2020-12-01 DIAGNOSIS — E663 Overweight: Secondary | ICD-10-CM | POA: Diagnosis not present

## 2020-12-01 DIAGNOSIS — M6740 Ganglion, unspecified site: Secondary | ICD-10-CM | POA: Diagnosis not present

## 2020-12-01 DIAGNOSIS — Z6825 Body mass index (BMI) 25.0-25.9, adult: Secondary | ICD-10-CM | POA: Diagnosis not present

## 2020-12-05 ENCOUNTER — Ambulatory Visit (HOSPITAL_COMMUNITY)
Admission: RE | Admit: 2020-12-05 | Discharge: 2020-12-05 | Disposition: A | Discharge status: Home / Self Care | Payer: Medicare HMO | Source: Ambulatory Visit | Attending: Family Medicine | Admitting: Family Medicine

## 2020-12-05 ENCOUNTER — Other Ambulatory Visit: Payer: Self-pay

## 2020-12-05 DIAGNOSIS — Z1231 Encounter for screening mammogram for malignant neoplasm of breast: Secondary | ICD-10-CM | POA: Insufficient documentation

## 2020-12-16 DIAGNOSIS — L720 Epidermal cyst: Secondary | ICD-10-CM | POA: Diagnosis not present

## 2020-12-16 DIAGNOSIS — L821 Other seborrheic keratosis: Secondary | ICD-10-CM | POA: Diagnosis not present

## 2020-12-16 DIAGNOSIS — D485 Neoplasm of uncertain behavior of skin: Secondary | ICD-10-CM | POA: Diagnosis not present

## 2020-12-30 DIAGNOSIS — E7849 Other hyperlipidemia: Secondary | ICD-10-CM | POA: Diagnosis not present

## 2020-12-30 DIAGNOSIS — E119 Type 2 diabetes mellitus without complications: Secondary | ICD-10-CM | POA: Diagnosis not present

## 2020-12-30 DIAGNOSIS — I10 Essential (primary) hypertension: Secondary | ICD-10-CM | POA: Diagnosis not present

## 2021-01-22 DIAGNOSIS — H524 Presbyopia: Secondary | ICD-10-CM | POA: Diagnosis not present

## 2021-01-22 DIAGNOSIS — E119 Type 2 diabetes mellitus without complications: Secondary | ICD-10-CM | POA: Diagnosis not present

## 2021-01-22 DIAGNOSIS — Z961 Presence of intraocular lens: Secondary | ICD-10-CM | POA: Diagnosis not present

## 2021-01-22 DIAGNOSIS — Z7984 Long term (current) use of oral hypoglycemic drugs: Secondary | ICD-10-CM | POA: Diagnosis not present

## 2021-03-30 DIAGNOSIS — Z6824 Body mass index (BMI) 24.0-24.9, adult: Secondary | ICD-10-CM | POA: Diagnosis not present

## 2021-03-30 DIAGNOSIS — E7849 Other hyperlipidemia: Secondary | ICD-10-CM | POA: Diagnosis not present

## 2021-03-30 DIAGNOSIS — E559 Vitamin D deficiency, unspecified: Secondary | ICD-10-CM | POA: Diagnosis not present

## 2021-03-30 DIAGNOSIS — E049 Nontoxic goiter, unspecified: Secondary | ICD-10-CM | POA: Diagnosis not present

## 2021-03-30 DIAGNOSIS — I700 Atherosclerosis of aorta: Secondary | ICD-10-CM | POA: Diagnosis not present

## 2021-03-30 DIAGNOSIS — I10 Essential (primary) hypertension: Secondary | ICD-10-CM | POA: Diagnosis not present

## 2021-03-30 DIAGNOSIS — E1029 Type 1 diabetes mellitus with other diabetic kidney complication: Secondary | ICD-10-CM | POA: Diagnosis not present

## 2021-03-30 DIAGNOSIS — E782 Mixed hyperlipidemia: Secondary | ICD-10-CM | POA: Diagnosis not present

## 2021-04-17 DIAGNOSIS — N281 Cyst of kidney, acquired: Secondary | ICD-10-CM | POA: Diagnosis not present

## 2021-04-17 DIAGNOSIS — D4102 Neoplasm of uncertain behavior of left kidney: Secondary | ICD-10-CM | POA: Diagnosis not present

## 2021-04-29 DIAGNOSIS — D472 Monoclonal gammopathy: Secondary | ICD-10-CM | POA: Diagnosis not present

## 2021-05-25 DIAGNOSIS — D472 Monoclonal gammopathy: Secondary | ICD-10-CM | POA: Diagnosis not present

## 2021-05-28 DIAGNOSIS — Z23 Encounter for immunization: Secondary | ICD-10-CM | POA: Diagnosis not present

## 2021-06-23 DIAGNOSIS — K5730 Diverticulosis of large intestine without perforation or abscess without bleeding: Secondary | ICD-10-CM | POA: Diagnosis not present

## 2021-06-23 DIAGNOSIS — Z0100 Encounter for examination of eyes and vision without abnormal findings: Secondary | ICD-10-CM | POA: Diagnosis not present

## 2021-06-23 DIAGNOSIS — C642 Malignant neoplasm of left kidney, except renal pelvis: Secondary | ICD-10-CM | POA: Diagnosis not present

## 2021-06-23 DIAGNOSIS — D4102 Neoplasm of uncertain behavior of left kidney: Secondary | ICD-10-CM | POA: Diagnosis not present

## 2021-06-23 DIAGNOSIS — N289 Disorder of kidney and ureter, unspecified: Secondary | ICD-10-CM | POA: Diagnosis not present

## 2021-06-23 DIAGNOSIS — K760 Fatty (change of) liver, not elsewhere classified: Secondary | ICD-10-CM | POA: Diagnosis not present

## 2021-06-23 DIAGNOSIS — H524 Presbyopia: Secondary | ICD-10-CM | POA: Diagnosis not present

## 2021-06-30 DIAGNOSIS — D4102 Neoplasm of uncertain behavior of left kidney: Secondary | ICD-10-CM | POA: Diagnosis not present

## 2021-07-06 DIAGNOSIS — E559 Vitamin D deficiency, unspecified: Secondary | ICD-10-CM | POA: Diagnosis not present

## 2021-07-06 DIAGNOSIS — E049 Nontoxic goiter, unspecified: Secondary | ICD-10-CM | POA: Diagnosis not present

## 2021-07-06 DIAGNOSIS — E1165 Type 2 diabetes mellitus with hyperglycemia: Secondary | ICD-10-CM | POA: Diagnosis not present

## 2021-07-06 DIAGNOSIS — E876 Hypokalemia: Secondary | ICD-10-CM | POA: Diagnosis not present

## 2021-07-06 DIAGNOSIS — E538 Deficiency of other specified B group vitamins: Secondary | ICD-10-CM | POA: Diagnosis not present

## 2021-07-06 DIAGNOSIS — E663 Overweight: Secondary | ICD-10-CM | POA: Diagnosis not present

## 2021-07-06 DIAGNOSIS — E782 Mixed hyperlipidemia: Secondary | ICD-10-CM | POA: Diagnosis not present

## 2021-07-06 DIAGNOSIS — Z6824 Body mass index (BMI) 24.0-24.9, adult: Secondary | ICD-10-CM | POA: Diagnosis not present

## 2021-09-07 DIAGNOSIS — I700 Atherosclerosis of aorta: Secondary | ICD-10-CM | POA: Diagnosis not present

## 2021-09-07 DIAGNOSIS — Z0000 Encounter for general adult medical examination without abnormal findings: Secondary | ICD-10-CM | POA: Diagnosis not present

## 2021-09-07 DIAGNOSIS — E1165 Type 2 diabetes mellitus with hyperglycemia: Secondary | ICD-10-CM | POA: Diagnosis not present

## 2021-09-07 DIAGNOSIS — I10 Essential (primary) hypertension: Secondary | ICD-10-CM | POA: Diagnosis not present

## 2021-09-07 DIAGNOSIS — Z1331 Encounter for screening for depression: Secondary | ICD-10-CM | POA: Diagnosis not present

## 2021-09-07 DIAGNOSIS — Z6824 Body mass index (BMI) 24.0-24.9, adult: Secondary | ICD-10-CM | POA: Diagnosis not present

## 2021-09-07 DIAGNOSIS — E559 Vitamin D deficiency, unspecified: Secondary | ICD-10-CM | POA: Diagnosis not present

## 2021-09-07 DIAGNOSIS — E663 Overweight: Secondary | ICD-10-CM | POA: Diagnosis not present

## 2021-09-07 DIAGNOSIS — J069 Acute upper respiratory infection, unspecified: Secondary | ICD-10-CM | POA: Diagnosis not present

## 2021-09-21 DIAGNOSIS — D472 Monoclonal gammopathy: Secondary | ICD-10-CM | POA: Diagnosis not present

## 2021-09-21 DIAGNOSIS — I10 Essential (primary) hypertension: Secondary | ICD-10-CM | POA: Diagnosis not present

## 2021-09-21 DIAGNOSIS — E559 Vitamin D deficiency, unspecified: Secondary | ICD-10-CM | POA: Diagnosis not present

## 2021-09-21 DIAGNOSIS — E0789 Other specified disorders of thyroid: Secondary | ICD-10-CM | POA: Diagnosis not present

## 2021-09-21 DIAGNOSIS — E042 Nontoxic multinodular goiter: Secondary | ICD-10-CM | POA: Diagnosis not present

## 2021-09-21 DIAGNOSIS — K760 Fatty (change of) liver, not elsewhere classified: Secondary | ICD-10-CM | POA: Diagnosis not present

## 2021-09-21 DIAGNOSIS — E119 Type 2 diabetes mellitus without complications: Secondary | ICD-10-CM | POA: Diagnosis not present

## 2021-09-23 DIAGNOSIS — D472 Monoclonal gammopathy: Secondary | ICD-10-CM | POA: Diagnosis not present

## 2021-10-13 ENCOUNTER — Encounter: Payer: Self-pay | Admitting: Internal Medicine

## 2021-12-09 ENCOUNTER — Encounter: Payer: Self-pay | Admitting: Internal Medicine

## 2021-12-09 ENCOUNTER — Ambulatory Visit: Payer: Medicare HMO | Admitting: Internal Medicine

## 2021-12-09 VITALS — BP 139/83 | HR 83 | Temp 97.5°F | Ht 69.0 in | Wt 159.6 lb

## 2021-12-09 DIAGNOSIS — R195 Other fecal abnormalities: Secondary | ICD-10-CM | POA: Diagnosis not present

## 2021-12-09 DIAGNOSIS — R634 Abnormal weight loss: Secondary | ICD-10-CM

## 2021-12-09 DIAGNOSIS — K760 Fatty (change of) liver, not elsewhere classified: Secondary | ICD-10-CM

## 2021-12-09 NOTE — Patient Instructions (Addendum)
For your low energy and low protein, would recommend you start drinking 1 protein shake a day or protein bar in the morning.  Hopefully this will help with your energy level as your protein levels build back up. ? ?Continue to monitor your weight. ? ?Continue to avoid sugary items and lettuce as these can cause loose stools.  ? ?I will print out a low FODMAP diet as well which may give you some insight on certain foods to avoid in regards to diarrhea. ? ?Follow-up in 6 months or sooner if needed. ? ?It was very nice meeting you today. ? ?Dr. Abbey Chatters ? ?At [facility] we value your feedback. You may receive a survey about your visit today. Please share your experience as we strive to create trusting relationships with our patients to provide genuine, compassionate, quality care. ? ?We appreciate your understanding and patience as we review any laboratory studies, imaging, and other diagnostic tests that are ordered as we care for you. Our office policy is 5 business days for review of these results, and any emergent or urgent results are addressed in a timely manner for your best interest. If you do not hear from our office in 1 week, please contact us.  ? ?We also encourage the use of MyChart, which contains your medical information for your review as well. If you are not enrolled in this feature, an access code is on this after visit summary for your convenience. Thank you for allowing [place] to be involved in your care. ? ?It was great to see you today!  I hope you have a great rest of your Spring! ? ? ? ?Melanie Welch. Abbey Chatters, D.O. ?Gastroenterology and Hepatology ?[facility] ? ?

## 2021-12-09 NOTE — Progress Notes (Signed)
? ? ?Referring Provider: [name], MD ?Primary Care Physician:  [name], MD ?Primary GI:  Dr. Abbey Chatters ? ?Chief Complaint  ?Patient presents with  ? Diarrhea  ?  When eating certain foods. No nausea, no vomiting, no abd pain, no bloody stools  ? ? ?HPI:   ?Melanie Welch is a 76 y.o. female who presents to clinic today for yearly follow-up visit.  She has history of fatty liver and acute blood loss anemia secondary to diverticular bleed in May 2020 s/p colonoscopy during hospitalization revealing pancolonic diverticula, epinephrine injected into a large diverticula in the mid sigmoid colon.  There was also a 5 mm polyp in the splenic flexure.  Pathology with tubular adenoma. ? ?In regards to her fatty liver, recent blood work showed normal LFTs in February 2023.Marland Kitchen  Does note losing 5 pounds since seen last year.  Diabetes under good control.  Watching her diet. Limiting fried/fatty foods. Occasional soda. No regular sweets. Drinks black coffee, about 1 cup a day.  Denies swelling in the abdomen or lower extremities, confusion or changes in mental status, yellowing of the eyes, or easy bruising. ? ?Intermittent loose stools when eating sugary items or lettuce otherwise bowel movements are normal. ? ?Denies any upper GI symptoms including heartburn, reflux, epigastric or chest pain, dysphagia/odynophagia. ? ?Past Medical History:  ?Diagnosis Date  ? Colonic adenoma 12/2018  ? ONE  ? Diabetes mellitus   ? Fatty liver   ? Hypertension   ? MGUS (monoclonal gammopathy of unknown significance)   ? Proteinuria   ? ? ?Past Surgical History:  ?Procedure Laterality Date  ? ABDOMINAL HYSTERECTOMY    ? BREAST BIOPSY  2005  ? left  ? CATARACT EXTRACTION W/PHACO Right 05/10/2017  ? Procedure: CATARACT EXTRACTION PHACO AND INTRAOCULAR LENS PLACEMENT ([facility]);  Surgeon: Rutherford Guys, MD;  [facility]: [facility];  Service: Ophthalmology;  Laterality: Right;  CDE: 5.04  ? CATARACT EXTRACTION W/PHACO Left 05/24/2017  ? Procedure:  CATARACT EXTRACTION PHACO AND INTRAOCULAR LENS PLACEMENT ([facility]);  Surgeon: Rutherford Guys, MD;  Location: [facility];  Service: Ophthalmology;  Laterality: Left;  CDE: 8.40  ? COLONOSCOPY  07/13/2011  ? Procedure: COLONOSCOPY;  Surgeon: Dorothyann Peng, MD;  [facility];  Service: Endoscopy;  Laterality: N/A;  9:30 AM  ? COLONOSCOPY N/A 12/09/2018  ? diverticulosis, suspect diverticulum in mid sigmoid s/p epi, unable to place clip. 5 mm benign polyp. Colonic lipoma. Normal TI. Many diverticula occluded with stool.   ? POLYPECTOMY  12/09/2018  ? Procedure: POLYPECTOMY;  Surgeon: Daneil Dolin, MD;  [facility];  Service: Endoscopy;;  splenic flexure  ? ? ?Current Outpatient Medications  ?Medication Sig Dispense Refill  ? amLODipine (NORVASC) 10 MG tablet Take 10 mg by mouth at bedtime.     ? Cholecalciferol (VITAMIN D3 PO) Take by mouth daily.    ? Coenzyme Q10 (CO Q 10 PO) Take by mouth daily.    ? Cyanocobalamin (B-12 PO) Take by mouth daily.    ? losartan (COZAAR) 100 MG tablet Take 100 mg by mouth daily.    ? metFORMIN (GLUCOPHAGE) 1000 MG tablet Take 500 mg by mouth 2 (two) times daily with a meal.  0  ? ?No current facility-administered medications for this visit.  ? ? ?Allergies as of 12/09/2021 - Review Complete 12/09/2021  ?Allergen Reaction Noted  ? Lipitor [atorvastatin] Nausea And Vomiting 07/18/2019  ? ? ?Family History  ?Problem Relation Age of Onset  ? Cancer  Mother   ? Cancer Sister   ? Colon cancer Neg Hx   ? ? ?Social History  ? ?Socioeconomic History  ? Marital status: Divorced  ?  Spouse name: Not on file  ? Number of children: Not on file  ? Years of education: Not on file  ? Highest education level: Not on file  ?Occupational History  ? Not on file  ?Tobacco Use  ? Smoking status: Former  ? Smokeless tobacco: Never  ?Vaping Use  ? Vaping Use: Never used  ?Substance and Sexual Activity  ? Alcohol use: Yes  ?  Comment: rare wine  ? Drug use: No  ? Sexual activity: Not Currently   ?Other Topics Concern  ? Not on file  ?Social History Narrative  ? Not on file  ? ?Social Determinants of Health  ? ?Financial Resource Strain: Not on file  ?Food Insecurity: Not on file  ?Transportation Needs: Not on file  ?Physical Activity: Not on file  ?Stress: Not on file  ?Social Connections: Not on file  ? ? ?Subjective: ?Review of Systems  ?Constitutional:  Negative for chills and fever.  ?HENT:  Negative for congestion and hearing loss.   ?Eyes:  Negative for blurred vision and double vision.  ?Respiratory:  Negative for cough and shortness of breath.   ?Cardiovascular:  Negative for chest pain and palpitations.  ?Gastrointestinal:  Positive for diarrhea. Negative for abdominal pain, blood in stool, constipation, heartburn, melena and vomiting.  ?Genitourinary:  Negative for dysuria and urgency.  ?Musculoskeletal:  Negative for joint pain and myalgias.  ?Skin:  Negative for itching and rash.  ?Neurological:  Negative for dizziness and headaches.  ?Psychiatric/Behavioral:  Negative for depression. The patient is not nervous/anxious.   ? ? ?Objective: ?BP 139/83   Pulse 83   Temp (!) 97.5 ?F (36.4 ?C) (Temporal)   Ht '5\' 9"'$  (1.753 m)   Wt 159 lb 9.6 oz (72.4 kg)   BMI 23.57 kg/m?  ?Physical Exam ?Constitutional:   ?   Appearance: Normal appearance.  ?HENT:  ?   Head: Normocephalic and atraumatic.  ?Eyes:  ?   Extraocular Movements: Extraocular movements intact.  ?   Conjunctiva/sclera: Conjunctivae normal.  ?Cardiovascular:  ?   Rate and Rhythm: Normal rate and regular rhythm.  ?Pulmonary:  ?   Effort: Pulmonary effort is normal.  ?   Breath sounds: Normal breath sounds.  ?Abdominal:  ?   General: Bowel sounds are normal.  ?   Palpations: Abdomen is soft.  ?Musculoskeletal:     ?   General: No swelling. Normal range of motion.  ?   Cervical back: Normal range of motion and neck supple.  ?Skin: ?   General: Skin is warm and dry.  ?   Coloration: Skin is not jaundiced.  ?Neurological:  ?   General: No  focal deficit present.  ?   Mental Status: She is alert and oriented to person, place, and time.  ?Psychiatric:     ?   Mood and Affect: Mood normal.     ?   Behavior: Behavior normal.  ? ? ? ?Assessment: ?*Diarrhea-intermittent ?*Weight loss ?*Nonalcoholic fatty liver disease ? ?Plan: ?Diarrhea mild, intermittent, related to sugary foods as well as lettuce.  We will print off a low FODMAP diet for patient today. ? ?Most recent serum albumin low as well as 5 pound weight loss.  Would recommend patient add protein shakes once daily.  If using powder would recommend 100% whey protein or  soy protein from trusted brand. ? ?Fatty liver disease stable.  Most recent blood work showed normal LFTs.  Continue to monitor.  Keep diabetes under good control. ? ?12/09/2021 10:00 AM ? ? ?Disclaimer: This note was dictated with voice recognition software. Similar sounding words can inadvertently be transcribed and may not be corrected upon review. ? ?

## 2022-01-25 DIAGNOSIS — E119 Type 2 diabetes mellitus without complications: Secondary | ICD-10-CM | POA: Diagnosis not present

## 2022-01-25 DIAGNOSIS — Z961 Presence of intraocular lens: Secondary | ICD-10-CM | POA: Diagnosis not present

## 2022-01-25 DIAGNOSIS — H524 Presbyopia: Secondary | ICD-10-CM | POA: Diagnosis not present

## 2022-02-08 ENCOUNTER — Other Ambulatory Visit (HOSPITAL_COMMUNITY): Payer: Self-pay | Admitting: Family Medicine

## 2022-02-08 DIAGNOSIS — Z1231 Encounter for screening mammogram for malignant neoplasm of breast: Secondary | ICD-10-CM

## 2022-02-18 ENCOUNTER — Ambulatory Visit (HOSPITAL_COMMUNITY)
Admission: RE | Admit: 2022-02-18 | Discharge: 2022-02-18 | Disposition: A | Discharge status: Home / Self Care | Payer: Medicare HMO | Source: Ambulatory Visit | Attending: Family Medicine | Admitting: Family Medicine

## 2022-02-18 DIAGNOSIS — Z1231 Encounter for screening mammogram for malignant neoplasm of breast: Secondary | ICD-10-CM | POA: Diagnosis not present

## 2022-05-06 DIAGNOSIS — Z0000 Encounter for general adult medical examination without abnormal findings: Secondary | ICD-10-CM | POA: Diagnosis not present

## 2022-05-06 DIAGNOSIS — E538 Deficiency of other specified B group vitamins: Secondary | ICD-10-CM | POA: Diagnosis not present

## 2022-05-06 DIAGNOSIS — E119 Type 2 diabetes mellitus without complications: Secondary | ICD-10-CM | POA: Diagnosis not present

## 2022-05-06 DIAGNOSIS — Z6824 Body mass index (BMI) 24.0-24.9, adult: Secondary | ICD-10-CM | POA: Diagnosis not present

## 2022-05-06 DIAGNOSIS — E559 Vitamin D deficiency, unspecified: Secondary | ICD-10-CM | POA: Diagnosis not present

## 2022-05-06 DIAGNOSIS — E782 Mixed hyperlipidemia: Secondary | ICD-10-CM | POA: Diagnosis not present

## 2022-05-06 DIAGNOSIS — Z23 Encounter for immunization: Secondary | ICD-10-CM | POA: Diagnosis not present

## 2022-05-06 DIAGNOSIS — Z86010 Personal history of colonic polyps: Secondary | ICD-10-CM | POA: Diagnosis not present

## 2022-05-06 DIAGNOSIS — I10 Essential (primary) hypertension: Secondary | ICD-10-CM | POA: Diagnosis not present

## 2022-05-06 DIAGNOSIS — J069 Acute upper respiratory infection, unspecified: Secondary | ICD-10-CM | POA: Diagnosis not present

## 2022-06-01 ENCOUNTER — Encounter: Payer: Self-pay | Admitting: *Deleted

## 2022-06-22 DIAGNOSIS — D49512 Neoplasm of unspecified behavior of left kidney: Secondary | ICD-10-CM | POA: Diagnosis not present

## 2022-06-22 DIAGNOSIS — D4102 Neoplasm of uncertain behavior of left kidney: Secondary | ICD-10-CM | POA: Diagnosis not present

## 2022-07-14 ENCOUNTER — Encounter: Payer: Self-pay | Admitting: Internal Medicine

## 2022-07-14 ENCOUNTER — Ambulatory Visit (INDEPENDENT_AMBULATORY_CARE_PROVIDER_SITE_OTHER): Payer: Medicare HMO | Admitting: Internal Medicine

## 2022-07-14 VITALS — BP 125/72 | HR 83 | Temp 97.5°F | Ht 68.0 in | Wt 163.3 lb

## 2022-07-14 DIAGNOSIS — K760 Fatty (change of) liver, not elsewhere classified: Secondary | ICD-10-CM | POA: Diagnosis not present

## 2022-07-14 DIAGNOSIS — R195 Other fecal abnormalities: Secondary | ICD-10-CM

## 2022-07-14 DIAGNOSIS — R140 Abdominal distension (gaseous): Secondary | ICD-10-CM

## 2022-07-14 DIAGNOSIS — R1013 Epigastric pain: Secondary | ICD-10-CM

## 2022-07-14 DIAGNOSIS — G8929 Other chronic pain: Secondary | ICD-10-CM | POA: Diagnosis not present

## 2022-07-14 NOTE — Patient Instructions (Signed)
I am going to check blood work today at [facility] including CBC, CMP, alpha gal testing for meat allergy, as well as a panel to screen for celiac disease/gluten intolerance.  We will call with these results.  Your weight is stable.  Continue to avoid food triggers.  Follow-up in 6 months.  It was very nice seeing you again today. I hope you have a Merry Christmas.  Dr. Abbey Chatters

## 2022-07-14 NOTE — Progress Notes (Signed)
Referring Provider: [name], MD Primary Care Physician:  [name], MD Primary GI:  Dr. Abbey Chatters  Chief Complaint  Patient presents with   Follow-up    Patient here today for a follow up. Patient has some periumbilical pain at times, depending on what she eats. She also has diarrhea when she eats the wrong things.    HPI:   Melanie Welch is a 76 y.o. female who presents to clinic today for yearly follow-up visit.  She has history of fatty liver and acute blood loss anemia secondary to diverticular bleed in May 2020 s/p colonoscopy during hospitalization revealing pancolonic diverticula, epinephrine injected into a large diverticula in the mid sigmoid colon.  There was also a 5 mm polyp in the splenic flexure.  Pathology with tubular adenoma.  In regards to her fatty liver, last blood work showed normal LFTs in February 2023..  Weight stable.  Diabetes under good control.  Watching her diet. Limiting fried/fatty foods. Occasional soda. No regular sweets. Drinks black coffee, about 1 cup a day.  Denies swelling in the abdomen or lower extremities, confusion or changes in mental status, yellowing of the eyes, or easy bruising.  Intermittent loose stools when eating sugary items or lettuce otherwise bowel movements are normal.  Does note increased GI symptoms when eating red meat.  States she had a tick bite in the summer.  Notes epigastric pain related to me as well as diarrhea and abdominal bloating.  Denies any upper GI symptoms including heartburn, reflux, epigastric or chest pain, dysphagia/odynophagia.  Past Medical History:  Diagnosis Date   Colonic adenoma 12/2018   ONE   Diabetes mellitus    Fatty liver    Hypertension    MGUS (monoclonal gammopathy of unknown significance)    Proteinuria     Past Surgical History:  Procedure Laterality Date   ABDOMINAL HYSTERECTOMY     BREAST BIOPSY  2005   left   CATARACT EXTRACTION W/PHACO Right 05/10/2017   Procedure: CATARACT  EXTRACTION PHACO AND INTRAOCULAR LENS PLACEMENT ([place]);  Surgeon: Rutherford Guys, MD;  [facility]: [facility];  Service: Ophthalmology;  Laterality: Right;  CDE: 5.04   CATARACT EXTRACTION W/PHACO Left 05/24/2017   Procedure: CATARACT EXTRACTION PHACO AND INTRAOCULAR LENS PLACEMENT ([facility]);  Surgeon: Rutherford Guys, MD;  Location: [facility];  Service: Ophthalmology;  Laterality: Left;  CDE: 8.40   COLONOSCOPY  07/13/2011   Procedure: COLONOSCOPY;  Surgeon: Dorothyann Peng, MD;  [facility];  Service: Endoscopy;  Laterality: N/A;  9:30 AM   COLONOSCOPY N/A 12/09/2018   diverticulosis, suspect diverticulum in mid sigmoid s/p epi, unable to place clip. 5 mm benign polyp. Colonic lipoma. Normal TI. Many diverticula occluded with stool.    POLYPECTOMY  12/09/2018   Procedure: POLYPECTOMY;  Surgeon: Daneil Dolin, MD;  [facility];  Service: Endoscopy;;  splenic flexure    Current Outpatient Medications  Medication Sig Dispense Refill   amLODipine (NORVASC) 10 MG tablet Take 10 mg by mouth at bedtime.      Cholecalciferol (VITAMIN D3 PO) Take by mouth daily.     Coenzyme Q10 (CO Q 10 PO) Take by mouth daily.     losartan (COZAAR) 100 MG tablet Take 100 mg by mouth daily.     metFORMIN (GLUCOPHAGE) 1000 MG tablet Take 500 mg by mouth 2 (two) times daily with a meal.  0   No current facility-administered medications for this visit.    Allergies as of  07/14/2022 - Review Complete 07/14/2022  Allergen Reaction Noted   Lipitor [atorvastatin] Nausea And Vomiting 07/18/2019    Family History  Problem Relation Age of Onset   Cancer Mother    Cancer Sister    Colon cancer Neg Hx     Social History   Socioeconomic History   Marital status: Divorced    Spouse name: Not on file   Number of children: Not on file   Years of education: Not on file   Highest education level: Not on file  Occupational History   Not on file  Tobacco Use   Smoking status: Former   Smokeless tobacco: Never   Vaping Use   Vaping Use: Never used  Substance and Sexual Activity   Alcohol use: Yes    Comment: rare wine   Drug use: No   Sexual activity: Not Currently  Other Topics Concern   Not on file  Social History Narrative   Not on file   Social Determinants of Health   Financial Resource Strain: Not on file  Food Insecurity: Not on file  Transportation Needs: Not on file  Physical Activity: Not on file  Stress: Not on file  Social Connections: Not on file    Subjective: Review of Systems  Constitutional:  Negative for chills and fever.  HENT:  Negative for congestion and hearing loss.   Eyes:  Negative for blurred vision and double vision.  Respiratory:  Negative for cough and shortness of breath.   Cardiovascular:  Negative for chest pain and palpitations.  Gastrointestinal:  Positive for diarrhea. Negative for abdominal pain, blood in stool, constipation, heartburn, melena and vomiting.  Genitourinary:  Negative for dysuria and urgency.  Musculoskeletal:  Negative for joint pain and myalgias.  Skin:  Negative for itching and rash.  Neurological:  Negative for dizziness and headaches.  Psychiatric/Behavioral:  Negative for depression. The patient is not nervous/anxious.      Objective: BP 125/72 (BP Location: Right Arm, Patient Position: Sitting, Cuff Size: Large)   Pulse 83   Temp (!) 97.5 F (36.4 C) (Temporal)   Ht '5\' 8"'$  (1.727 m)   Wt 163 lb 4.8 oz (74.1 kg)   BMI 24.83 kg/m  Physical Exam Constitutional:      Appearance: Normal appearance.  HENT:     Head: Normocephalic and atraumatic.  Eyes:     Extraocular Movements: Extraocular movements intact.     Conjunctiva/sclera: Conjunctivae normal.  Cardiovascular:     Rate and Rhythm: Normal rate and regular rhythm.  Pulmonary:     Effort: Pulmonary effort is normal.     Breath sounds: Normal breath sounds.  Abdominal:     General: Bowel sounds are normal.     Palpations: Abdomen is soft.   Musculoskeletal:        General: No swelling. Normal range of motion.     Cervical back: Normal range of motion and neck supple.  Skin:    General: Skin is warm and dry.     Coloration: Skin is not jaundiced.  Neurological:     General: No focal deficit present.     Mental Status: She is alert and oriented to person, place, and time.  Psychiatric:        Mood and Affect: Mood normal.        Behavior: Behavior normal.      Assessment: *Diarrhea-intermittent *Nonalcoholic fatty liver disease *Epigastric pain *Abdominal bloating  Plan: Diarrhea mild, intermittent, related to sugary foods as well as  lettuce.  Recommended food diary and avoid triggers.  Does note new onset of GI symptoms related to red meat.  Reports tick bite this past summer.  Will check alpha gal today.  Screen for celiac disease as well given worsening abdominal pain and bloating.  Weight stable.  Continue to monitor.  Fatty liver disease stable.  Check CBC, CMP today.  Continue to monitor.  Keep diabetes under good control.  Follow-up in 6 months  07/14/2022 11:32 AM   Disclaimer: This note was dictated with voice recognition software. Similar sounding words can inadvertently be transcribed and may not be corrected upon review.

## 2022-07-16 LAB — CELIAC AB TTG DGP TIGA
Antigliadin Abs, IgA: 5 units (ref 0–19)
Gliadin IgG: 3 units (ref 0–19)
IgA/Immunoglobulin A, Serum: 182 mg/dL (ref 64–422)
Tissue Transglut Ab: <2 U/mL (ref 0–5)
Transglutaminase IgA: <2 U/mL (ref 0–3)

## 2022-07-16 LAB — COMPREHENSIVE METABOLIC PANEL
ALT: 13 IU/L (ref 0–32)
AST: 17 IU/L (ref 0–40)
Albumin/Globulin Ratio: 1.2 (ref 1.2–2.2)
Albumin: 4 g/dL (ref 3.8–4.8)
Alkaline Phosphatase: 41 IU/L — ABNORMAL LOW (ref 44–121)
BUN/Creatinine Ratio: 13 (ref 12–28)
BUN: 13 mg/dL (ref 8–27)
Bilirubin Total: 0.3 mg/dL (ref 0.0–1.2)
CO2: 26 mmol/L (ref 20–29)
Calcium: 9.3 mg/dL (ref 8.7–10.3)
Chloride: 101 mmol/L (ref 96–106)
Creatinine, Ser: 1 mg/dL (ref 0.57–1.00)
Globulin, Total: 3.3 g/dL (ref 1.5–4.5)
Glucose: 179 mg/dL — ABNORMAL HIGH (ref 70–99)
Potassium: 3.8 mmol/L (ref 3.5–5.2)
Sodium: 139 mmol/L (ref 134–144)
Total Protein: 7.3 g/dL (ref 6.0–8.5)
eGFR: 58 mL/min/{1.73_m2} — ABNORMAL LOW (ref >59)

## 2022-07-16 LAB — ALPHA-GAL PANEL
Allergen Lamb IgE: <0.1 kU/L
Beef IgE: <0.1 kU/L
IgE (Immunoglobulin E), Serum: 17 IU/mL (ref 6–495)
O215-IgE Alpha-Gal: 0.31 kU/L — AB
Pork IgE: <0.1 kU/L

## 2022-07-16 LAB — CBC
Hematocrit: 35.5 % (ref 34.0–46.6)
Hemoglobin: 11.7 g/dL (ref 11.1–15.9)
MCH: 27 pg (ref 26.6–33.0)
MCHC: 33 g/dL (ref 31.5–35.7)
MCV: 82 fL (ref 79–97)
Platelets: 219 10*3/uL (ref 150–450)
RBC: 4.33 x10E6/uL (ref 3.77–5.28)
RDW: 13.6 % (ref 11.7–15.4)
WBC: 4.3 10*3/uL (ref 3.4–10.8)

## 2022-08-02 HISTORY — PX: MASTECTOMY: SHX3

## 2022-10-14 DIAGNOSIS — S60359A Superficial foreign body of unspecified thumb, initial encounter: Secondary | ICD-10-CM | POA: Diagnosis not present

## 2022-10-14 DIAGNOSIS — E538 Deficiency of other specified B group vitamins: Secondary | ICD-10-CM | POA: Diagnosis not present

## 2022-10-14 DIAGNOSIS — E559 Vitamin D deficiency, unspecified: Secondary | ICD-10-CM | POA: Diagnosis not present

## 2022-10-14 DIAGNOSIS — Z0001 Encounter for general adult medical examination with abnormal findings: Secondary | ICD-10-CM | POA: Diagnosis not present

## 2022-10-14 DIAGNOSIS — E7849 Other hyperlipidemia: Secondary | ICD-10-CM | POA: Diagnosis not present

## 2022-10-14 DIAGNOSIS — E1159 Type 2 diabetes mellitus with other circulatory complications: Secondary | ICD-10-CM | POA: Diagnosis not present

## 2022-10-14 DIAGNOSIS — H6590 Unspecified nonsuppurative otitis media, unspecified ear: Secondary | ICD-10-CM | POA: Diagnosis not present

## 2022-10-14 DIAGNOSIS — E782 Mixed hyperlipidemia: Secondary | ICD-10-CM | POA: Diagnosis not present

## 2022-10-14 DIAGNOSIS — Z1331 Encounter for screening for depression: Secondary | ICD-10-CM | POA: Diagnosis not present

## 2022-10-14 DIAGNOSIS — Z6825 Body mass index (BMI) 25.0-25.9, adult: Secondary | ICD-10-CM | POA: Diagnosis not present

## 2022-12-15 DIAGNOSIS — H524 Presbyopia: Secondary | ICD-10-CM | POA: Diagnosis not present

## 2023-01-03 DIAGNOSIS — E1159 Type 2 diabetes mellitus with other circulatory complications: Secondary | ICD-10-CM | POA: Diagnosis not present

## 2023-01-03 DIAGNOSIS — E559 Vitamin D deficiency, unspecified: Secondary | ICD-10-CM | POA: Diagnosis not present

## 2023-01-03 DIAGNOSIS — I10 Essential (primary) hypertension: Secondary | ICD-10-CM | POA: Diagnosis not present

## 2023-01-03 DIAGNOSIS — E7849 Other hyperlipidemia: Secondary | ICD-10-CM | POA: Diagnosis not present

## 2023-01-03 DIAGNOSIS — G729 Myopathy, unspecified: Secondary | ICD-10-CM | POA: Diagnosis not present

## 2023-01-03 DIAGNOSIS — Z6823 Body mass index (BMI) 23.0-23.9, adult: Secondary | ICD-10-CM | POA: Diagnosis not present

## 2023-01-12 ENCOUNTER — Encounter: Payer: Self-pay | Admitting: Internal Medicine

## 2023-01-12 ENCOUNTER — Ambulatory Visit: Payer: Medicare HMO | Admitting: Internal Medicine

## 2023-01-20 ENCOUNTER — Other Ambulatory Visit (HOSPITAL_COMMUNITY): Payer: Self-pay | Admitting: Family Medicine

## 2023-01-20 DIAGNOSIS — Z1231 Encounter for screening mammogram for malignant neoplasm of breast: Secondary | ICD-10-CM

## 2023-02-07 ENCOUNTER — Encounter (HOSPITAL_COMMUNITY): Payer: Self-pay | Admitting: Family Medicine

## 2023-02-08 ENCOUNTER — Other Ambulatory Visit (HOSPITAL_COMMUNITY): Payer: Self-pay | Admitting: Family Medicine

## 2023-02-08 DIAGNOSIS — N6320 Unspecified lump in the left breast, unspecified quadrant: Secondary | ICD-10-CM

## 2023-02-18 DIAGNOSIS — Z6823 Body mass index (BMI) 23.0-23.9, adult: Secondary | ICD-10-CM | POA: Diagnosis not present

## 2023-02-18 DIAGNOSIS — I10 Essential (primary) hypertension: Secondary | ICD-10-CM | POA: Diagnosis not present

## 2023-02-18 DIAGNOSIS — R946 Abnormal results of thyroid function studies: Secondary | ICD-10-CM | POA: Diagnosis not present

## 2023-03-22 ENCOUNTER — Ambulatory Visit (HOSPITAL_COMMUNITY)
Admission: RE | Admit: 2023-03-22 | Discharge: 2023-03-22 | Disposition: A | Discharge status: Home / Self Care | Payer: Medicare HMO | Source: Ambulatory Visit | Attending: Family Medicine | Admitting: Family Medicine

## 2023-03-22 ENCOUNTER — Other Ambulatory Visit (HOSPITAL_COMMUNITY): Payer: Self-pay | Admitting: Family Medicine

## 2023-03-22 DIAGNOSIS — N6320 Unspecified lump in the left breast, unspecified quadrant: Secondary | ICD-10-CM

## 2023-03-22 DIAGNOSIS — R928 Other abnormal and inconclusive findings on diagnostic imaging of breast: Secondary | ICD-10-CM | POA: Insufficient documentation

## 2023-03-22 DIAGNOSIS — Z1239 Encounter for other screening for malignant neoplasm of breast: Secondary | ICD-10-CM | POA: Insufficient documentation

## 2023-03-22 DIAGNOSIS — N6321 Unspecified lump in the left breast, upper outer quadrant: Secondary | ICD-10-CM | POA: Diagnosis not present

## 2023-03-22 DIAGNOSIS — R590 Localized enlarged lymph nodes: Secondary | ICD-10-CM | POA: Insufficient documentation

## 2023-03-22 DIAGNOSIS — R92323 Mammographic fibroglandular density, bilateral breasts: Secondary | ICD-10-CM | POA: Diagnosis not present

## 2023-03-23 ENCOUNTER — Other Ambulatory Visit (HOSPITAL_COMMUNITY): Payer: Self-pay | Admitting: Family Medicine

## 2023-03-23 DIAGNOSIS — R928 Other abnormal and inconclusive findings on diagnostic imaging of breast: Secondary | ICD-10-CM

## 2023-03-29 ENCOUNTER — Ambulatory Visit (HOSPITAL_COMMUNITY): Payer: Medicare HMO

## 2023-03-30 ENCOUNTER — Ambulatory Visit: Admission: RE | Admit: 2023-03-30 | Payer: Medicare HMO | Source: Ambulatory Visit

## 2023-03-30 ENCOUNTER — Ambulatory Visit
Admission: RE | Admit: 2023-03-30 | Discharge: 2023-03-30 | Disposition: A | Discharge status: Home / Self Care | Payer: Medicare HMO | Source: Ambulatory Visit | Attending: Family Medicine | Admitting: Family Medicine

## 2023-03-30 DIAGNOSIS — R928 Other abnormal and inconclusive findings on diagnostic imaging of breast: Secondary | ICD-10-CM | POA: Diagnosis not present

## 2023-03-30 DIAGNOSIS — C773 Secondary and unspecified malignant neoplasm of axilla and upper limb lymph nodes: Secondary | ICD-10-CM | POA: Diagnosis not present

## 2023-03-30 DIAGNOSIS — C50912 Malignant neoplasm of unspecified site of left female breast: Secondary | ICD-10-CM | POA: Diagnosis not present

## 2023-03-30 DIAGNOSIS — C50919 Malignant neoplasm of unspecified site of unspecified female breast: Secondary | ICD-10-CM

## 2023-03-30 DIAGNOSIS — N6321 Unspecified lump in the left breast, upper outer quadrant: Secondary | ICD-10-CM | POA: Diagnosis not present

## 2023-03-30 DIAGNOSIS — C50412 Malignant neoplasm of upper-outer quadrant of left female breast: Secondary | ICD-10-CM | POA: Diagnosis not present

## 2023-03-30 DIAGNOSIS — R590 Localized enlarged lymph nodes: Secondary | ICD-10-CM | POA: Diagnosis not present

## 2023-03-30 DIAGNOSIS — R599 Enlarged lymph nodes, unspecified: Secondary | ICD-10-CM | POA: Diagnosis not present

## 2023-03-30 HISTORY — DX: Malignant neoplasm of unspecified site of unspecified female breast: C50.919

## 2023-03-30 HISTORY — PX: BREAST BIOPSY: SHX20

## 2023-04-05 DIAGNOSIS — C50412 Malignant neoplasm of upper-outer quadrant of left female breast: Secondary | ICD-10-CM | POA: Diagnosis not present

## 2023-04-06 ENCOUNTER — Other Ambulatory Visit: Payer: Self-pay | Admitting: General Surgery

## 2023-04-06 DIAGNOSIS — Z170 Estrogen receptor positive status [ER+]: Secondary | ICD-10-CM

## 2023-04-07 ENCOUNTER — Inpatient Hospital Stay: Payer: Medicare HMO | Attending: Hematology and Oncology | Admitting: Hematology and Oncology

## 2023-04-07 ENCOUNTER — Inpatient Hospital Stay: Payer: Medicare HMO

## 2023-04-07 VITALS — BP 151/83 | HR 85 | Temp 97.5°F | Resp 18 | Ht 68.0 in | Wt 154.7 lb

## 2023-04-07 DIAGNOSIS — C50412 Malignant neoplasm of upper-outer quadrant of left female breast: Secondary | ICD-10-CM | POA: Diagnosis not present

## 2023-04-07 DIAGNOSIS — Z170 Estrogen receptor positive status [ER+]: Secondary | ICD-10-CM | POA: Diagnosis not present

## 2023-04-07 DIAGNOSIS — Z87891 Personal history of nicotine dependence: Secondary | ICD-10-CM | POA: Diagnosis not present

## 2023-04-07 NOTE — Progress Notes (Signed)
[facility] CONSULT NOTE  Patient Care Team: Assunta Found, MD as PCP - General (Family Medicine) Lanelle Bal, DO as Consulting Physician (Internal Medicine)  CHIEF COMPLAINTS/PURPOSE OF CONSULTATION:  Newly diagnosed breast cancer  HISTORY OF PRESENTING ILLNESS:  Melanie Welch 77 y.o. female is here because of recent diagnosis of left breast cancer.  Patient had a palpable concern in the left axilla and that led to mammograms and ultrasounds.  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  She is here today to discuss adjuvant treatment plan.  I reviewed her records extensively and collaborated the history with the patient.  SUMMARY OF ONCOLOGIC HISTORY: Oncology History  Malignant neoplasm of upper-outer quadrant of left breast in female, estrogen receptor positive (HCC)  03/30/2023 Initial Diagnosis   Palpable concern left axilla, mammogram showed 0.5 cm mass left breast 1:30 position possibly an intramammary lymph node, at the site of palpable concern there was an abnormal lymph node.  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, left axillary lymph node biopsy positive, no evidence of extranodal extension, ER 100%, PR 99%, Ki-67 50%, HER2 1+ negative   04/07/2023 Cancer Staging   Staging form: Breast, AJCC 8th Edition - Clinical: Stage IIA (cT1b, cN1, cM0, G3, ER+, PR+, HER2-) - Signed by Serena Croissant, MD on 04/07/2023 Histologic grading system: 3 grade system      MEDICAL HISTORY:  Past Medical History:  Diagnosis Date   Colonic adenoma 12/2018   ONE   Diabetes mellitus    Fatty liver    Hypertension    MGUS (monoclonal gammopathy of unknown significance)    Proteinuria     SURGICAL HISTORY: Past Surgical History:  Procedure Laterality Date   ABDOMINAL HYSTERECTOMY     BREAST  BIOPSY  2005   left   BREAST BIOPSY Left 03/30/2023   [place] LT BREAST BX W LOC DEV EA ADD LESION IMG BX SPEC US GUIDE 03/30/2023 [facility]   BREAST BIOPSY Left 03/30/2023   MM LT BREAST BX W LOC DEV 1ST LESION IMAGE BX SPEC STEREO GUIDE 03/30/2023 [facility]   BREAST BIOPSY Left 03/30/2023   [place] LT BREAST BX W LOC DEV 1ST LESION IMG BX SPEC US GUIDE 03/30/2023 [facility]   CATARACT EXTRACTION W/PHACO Right 05/10/2017   Procedure: CATARACT EXTRACTION PHACO AND INTRAOCULAR LENS PLACEMENT ([facility]);  Surgeon: Jethro Bolus, MD;  Location[facility];  Service: Ophthalmology;  Laterality: Right;  CDE: 5.04   CATARACT EXTRACTION W/PHACO Left 05/24/2017   Procedure: CATARACT EXTRACTION PHACO AND INTRAOCULAR LENS PLACEMENT ([facility]);  Surgeon: Jethro Bolus, MD;  Location: [facility];  Service: Ophthalmology;  Laterality: Left;  CDE: 8.40   COLONOSCOPY  07/13/2011   Procedure: COLONOSCOPY;  Surgeon: Arlyce Harman, MD;  [facility];  Service: Endoscopy;  Laterality: N/A;  9:30 AM   COLONOSCOPY N/A 12/09/2018   diverticulosis, suspect diverticulum in mid sigmoid s/p epi, unable to place clip. 5 mm benign polyp. Colonic lipoma. Normal TI. Many diverticula occluded with stool.    POLYPECTOMY  12/09/2018   Procedure: POLYPECTOMY;  Surgeon: Corbin Ade, MD;  [facility];  Service: Endoscopy;;  splenic flexure    SOCIAL HISTORY: Social History   Socioeconomic History   Marital status: Divorced    Spouse name:  Not on file   Number of children: Not on file   Years of education: Not on file   Highest education level: Not on file  Occupational History   Not on file  Tobacco Use   Smoking status: Former   Smokeless tobacco: Never  Vaping Use   Vaping status: Never Used  Substance and Sexual Activity   Alcohol use: Yes    Comment: rare wine   Drug use: No   Sexual activity: Not Currently  Other Topics Concern   Not on file  Social History Narrative   Not on file    Social Determinants of Health   Financial Resource Strain: Low Risk  (04/29/2021)   Received from [facility], [facility]   Overall Financial Resource Strain (CARDIA)    Difficulty of Paying Living Expenses: Not hard at all  Food Insecurity: No Food Insecurity (04/29/2021)   Received from [facility], [facility]   Hunger Vital Sign    Worried About Running Out of Food in the Last Year: Never true    Ran Out of Food in the Last Year: Never true  Transportation Needs: No Transportation Needs (04/29/2021)   Received from [facility], [facility]   [facility] - Transportation    Lack of Transportation (Medical): No    Lack of Transportation (Non-Medical): No  Physical Activity: Inactive (09/23/2021)   Received from [facility], [facility]   Exercise Vital Sign    Days of Exercise per Week: 0 days    Minutes of Exercise per Session: 0 min  Stress: No Stress Concern Present (04/29/2021)   Received from [facility], [facility] - Occupational Stress Questionnaire    Feeling of Stress : Not at all  Social Connections: Unknown (12/15/2021)   Received from [facility], [facility]   Social Network    Social Network: Not on file  Intimate Partner Violence: Unknown (11/05/2021)   Received from [facility], [facility]   HITS    Physically Hurt: Not on file    Insult or Talk Down To: Not on file    Threaten Physical Harm: Not on file    Scream or Curse: Not on file    FAMILY HISTORY: Family History  Problem Relation Age of Onset   Cancer Mother    Cancer Sister    Colon cancer Neg Hx     ALLERGIES:  is allergic to lipitor [atorvastatin].  MEDICATIONS:  Current Outpatient Medications  Medication Sig Dispense Refill   amLODipine (NORVASC) 10 MG tablet Take 10 mg by mouth at bedtime.      Cholecalciferol (VITAMIN D3 PO) Take by mouth daily.     Coenzyme Q10 (CO Q 10 PO) Take by mouth daily.      losartan (COZAAR) 100 MG tablet Take 100 mg by mouth daily.     metFORMIN (GLUCOPHAGE) 1000 MG tablet Take 500 mg by mouth 2 (two) times daily with a meal.  0   No current facility-administered medications for this visit.    REVIEW OF SYSTEMS:   Constitutional: Denies fevers, chills or abnormal night sweats   All other systems were reviewed with the patient and are negative.  PHYSICAL EXAMINATION: ECOG PERFORMANCE STATUS: 1 - Symptomatic but completely ambulatory  Vitals:   04/07/23 1427  BP: (!) 151/83  Pulse: 85  Resp: 18  Temp: (!) 97.5 F (36.4 C)  SpO2: 98%  Filed Weights   04/07/23 1427  Weight: 154 lb 11.2 oz (70.2 kg)    GENERAL:alert, no distress and comfortable    LABORATORY DATA:  I have reviewed the data as listed Lab Results  Component Value Date   WBC 4.3 07/14/2022   HGB 11.7 07/14/2022   HCT 35.5 07/14/2022   MCV 82 07/14/2022   PLT 219 07/14/2022   Lab Results  Component Value Date   NA 139 07/14/2022   K 3.8 07/14/2022   CL 101 07/14/2022   CO2 26 07/14/2022    RADIOGRAPHIC STUDIES: I have personally reviewed the radiological reports and agreed with the findings in the report.  ASSESSMENT AND PLAN:  Malignant neoplasm of upper-outer quadrant of left breast in female, estrogen receptor positive (HCC) Palpable concern left axilla, mammogram showed 0.5 cm mass left breast 1:30 position possibly an intramammary lymph node, at the site of palpable concern there was an abnormal lymph node.  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 After reviewing the pathology  in detail, we proceeded to discuss the different treatment options between surgery, radiation, chemotherapy, antiestrogen therapies.  Recommendations: 1. Breast conserving surgery followed by 2. Oncotype DX testing to determine if chemotherapy would be of any benefit followed by 3. Adjuvant radiation therapy followed by 4. Adjuvant antiestrogen therapy  Oncotype counseling: I discussed Oncotype DX test. I explained to the patient that this is a 21 gene panel to evaluate patient tumors DNA to calculate recurrence score. This would help determine whether patient has high risk or low risk breast cancer. She understands that if her tumor was found to be high risk, she would benefit from systemic chemotherapy. If low risk, no need of chemotherapy.  Return to clinic after surgery to discuss final pathology report and then determine if Oncotype DX testing will need to be sent.   All questions were answered. The patient knows to call the clinic with any problems, questions or concerns.    Tamsen Meek, MD 04/08/23

## 2023-04-07 NOTE — Assessment & Plan Note (Addendum)
Palpable concern left axilla, mammogram showed 0.5 cm mass left breast 1:30 position possibly an intramammary lymph node, at the site of palpable concern there was an abnormal lymph node.  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 After reviewing the pathology in detail, we proceeded to discuss the different treatment options between surgery, radiation, chemotherapy, antiestrogen therapies.  Recommendations: 1. Breast conserving surgery followed by 2. Oncotype DX testing to determine if chemotherapy would be of any benefit followed by 3. Adjuvant radiation therapy followed by 4. Adjuvant antiestrogen therapy  Oncotype counseling: I discussed Oncotype DX test. I explained to the patient that this is a 21 gene panel to evaluate patient tumors DNA to calculate recurrence score. This would help determine whether patient has high risk or low risk breast cancer. She understands that if her tumor was found to be high risk, she would benefit from systemic chemotherapy. If low risk, no need of chemotherapy.  Return to clinic after surgery to discuss final pathology report and then determine if Oncotype DX testing will need to be sent.

## 2023-04-09 ENCOUNTER — Ambulatory Visit
Admission: RE | Admit: 2023-04-09 | Discharge: 2023-04-09 | Disposition: A | Discharge status: Home / Self Care | Payer: Medicare HMO | Source: Ambulatory Visit | Attending: General Surgery | Admitting: General Surgery

## 2023-04-09 DIAGNOSIS — R928 Other abnormal and inconclusive findings on diagnostic imaging of breast: Secondary | ICD-10-CM | POA: Diagnosis not present

## 2023-04-09 DIAGNOSIS — C50411 Malignant neoplasm of upper-outer quadrant of right female breast: Secondary | ICD-10-CM

## 2023-04-09 MED ORDER — GADOPICLENOL 0.5 MMOL/ML IV SOLN
7.0000 mL | Freq: Once | INTRAVENOUS | Status: AC | PRN
  Administered 2023-04-09: 7 mL via INTRAVENOUS

## 2023-04-11 ENCOUNTER — Encounter: Payer: Self-pay | Admitting: *Deleted

## 2023-04-11 NOTE — Progress Notes (Signed)
Radiation Oncology         [phone] ________________________________  Name: Melanie Welch        MRN: [id number]  Date of Service: 04/12/2023 DOB: [birth year]  WU:[id number], Jonny Ruiz, MD  Griselda Miner, MD     REFERRING PHYSICIAN: [name], MD   DIAGNOSIS: The encounter diagnosis was Malignant neoplasm of upper-outer quadrant of left breast in female, estrogen receptor positive (HCC).   HISTORY OF PRESENT ILLNESS: Melanie Welch is a 77 y.o. female seen at the request of Dr. Carolynne Edouard for newly diagnosed left breast cancer. The patient presented with a palpable abnormality in the left axillary region.  Further diagnostic workup showed a mass in the left breast in the 1:30 position that was possibly an intramammary lymph node as well as a palpable abnormal lymph node in the axilla.  She underwent a biopsy on 03/30/2023 that showed invasive lobular carcinoma in the 130 specimen that was grade 3, and similar findings of grade 3 invasive lobular carcinoma were seen in the second specimen of the left breast.  Metastatic carcinoma was noted to the left axillary lymph node, and prognostics on the 130 o'clock lesion showed that the tumor was ER/PR positive HER2 negative with a Ki-67 of 50%.  Discussion in multidisciplinary breast oncology conference was to proceed with an MRI scan for extent of disease which was performed on 04/09/2023, this showed***.  She is seen today to discuss radiotherapy in the adjuvant setting at the appropriate time.    PREVIOUS RADIATION THERAPY: {EXAM; YES/NO:19492::"No"}   PAST MEDICAL HISTORY:  Past Medical History:  Diagnosis Date   Colonic adenoma 12/2018   ONE   Diabetes mellitus    Fatty liver    Hypertension    MGUS (monoclonal gammopathy of unknown significance)    Proteinuria        PAST SURGICAL HISTORY: Past Surgical History:  Procedure Laterality Date   ABDOMINAL HYSTERECTOMY     BREAST BIOPSY  2005   left   BREAST BIOPSY Left 03/30/2023   [place] LT  BREAST BX W LOC DEV EA ADD LESION IMG BX SPEC US GUIDE 03/30/2023 [facility]   BREAST BIOPSY Left 03/30/2023   MM LT BREAST BX W LOC DEV 1ST LESION IMAGE BX SPEC STEREO GUIDE 03/30/2023 [facility]   BREAST BIOPSY Left 03/30/2023   [place] LT BREAST BX W LOC DEV 1ST LESION IMG BX SPEC US GUIDE 03/30/2023 [facility]   CATARACT EXTRACTION W/PHACO Right 05/10/2017   Procedure: CATARACT EXTRACTION PHACO AND INTRAOCULAR LENS PLACEMENT (IOC);  Surgeon: Jethro Bolus, MD;  Location: [facility];  Service: Ophthalmology;  Laterality: Right;  CDE: 5.04   CATARACT EXTRACTION W/PHACO Left 05/24/2017   Procedure: CATARACT EXTRACTION PHACO AND INTRAOCULAR LENS PLACEMENT (IOC);  Surgeon: Jethro Bolus, MD;  Location: [facility];  Service: Ophthalmology;  Laterality: Left;  CDE: 8.40   COLONOSCOPY  07/13/2011   Procedure: COLONOSCOPY;  Surgeon: Arlyce Harman, MD;  Location: [facility];  Service: Endoscopy;  Laterality: N/A;  9:30 AM   COLONOSCOPY N/A 12/09/2018   diverticulosis, suspect diverticulum in mid sigmoid s/p epi, unable to place clip. 5 mm benign polyp. Colonic lipoma. Normal TI. Many diverticula occluded with stool.    POLYPECTOMY  12/09/2018   Procedure: POLYPECTOMY;  Surgeon: Corbin Ade, MD;  Location: [facility];  Service: Endoscopy;;  splenic flexure     FAMILY HISTORY:  Family History  Problem Relation Age of Onset   Cancer Mother  Cancer Sister    Colon cancer Neg Hx      SOCIAL HISTORY:  reports that she has quit smoking. She has never used smokeless tobacco. She reports current alcohol use. She reports that she does not use drugs.   ALLERGIES: Lipitor [atorvastatin]   MEDICATIONS:  Current Outpatient Medications  Medication Sig Dispense Refill   amLODipine (NORVASC) 10 MG tablet Take 10 mg by mouth at bedtime.      Cholecalciferol (VITAMIN D3 PO) Take by mouth daily.     Coenzyme Q10 (CO Q 10 PO) Take by mouth daily.     losartan (COZAAR) 100 MG tablet Take 100  mg by mouth daily.     metFORMIN (GLUCOPHAGE) 1000 MG tablet Take 500 mg by mouth 2 (two) times daily with a meal.  0   No current facility-administered medications for this visit.     REVIEW OF SYSTEMS: On review of systems, the patient reports that she is doing ***     PHYSICAL EXAM:  Wt Readings from Last 3 Encounters:  04/07/23 154 lb 11.2 oz (70.2 kg)  07/14/22 163 lb 4.8 oz (74.1 kg)  12/09/21 159 lb 9.6 oz (72.4 kg)   Temp Readings from Last 3 Encounters:  04/07/23 (!) 97.5 F (36.4 C) (Temporal)  07/14/22 (!) 97.5 F (36.4 C) (Temporal)  12/09/21 (!) 97.5 F (36.4 C) (Temporal)   BP Readings from Last 3 Encounters:  04/07/23 (!) 151/83  07/14/22 125/72  12/09/21 139/83   Pulse Readings from Last 3 Encounters:  04/07/23 85  07/14/22 83  12/09/21 83    In general this is a well appearing *** female in no acute distress. She's alert and oriented x4 and appropriate throughout the examination. Cardiopulmonary assessment is negative for acute distress and she exhibits normal effort. Bilateral breast exam is deferred.    ECOG = ***  0 - Asymptomatic (Fully active, able to carry on all predisease activities without restriction)  1 - Symptomatic but completely ambulatory (Restricted in physically strenuous activity but ambulatory and able to carry out work of a light or sedentary nature. For example, light housework, office work)  2 - Symptomatic, <50% in bed during the day (Ambulatory and capable of all self care but unable to carry out any work activities. Up and about more than 50% of waking hours)  3 - Symptomatic, >50% in bed, but not bedbound (Capable of only limited self-care, confined to bed or chair 50% or more of waking hours)  4 - Bedbound (Completely disabled. Cannot carry on any self-care. Totally confined to bed or chair)  5 - Death   Santiago Glad MM, Creech RH, Tormey DC, et al. [phone]). "Toxicity and response criteria of the [facility]  Group". Am. Evlyn Clines. Oncol. 5 (6): 649-55    LABORATORY DATA:  Lab Results  Component Value Date   WBC 4.3 07/14/2022   HGB 11.7 07/14/2022   HCT 35.5 07/14/2022   MCV 82 07/14/2022   PLT 219 07/14/2022   Lab Results  Component Value Date   NA 139 07/14/2022   K 3.8 07/14/2022   CL 101 07/14/2022   CO2 26 07/14/2022   Lab Results  Component Value Date   ALT 13 07/14/2022   AST 17 07/14/2022   ALKPHOS 41 (L) 07/14/2022   BILITOT 0.3 07/14/2022      RADIOGRAPHY: [place] LT BREAST BX W LOC DEV 1ST LESION IMG BX SPEC US GUIDE  Addendum Date: 03/31/2023   ADDENDUM REPORT: 03/31/2023  13:30 ADDENDUM: Pathology revealed: GRADE III INVASIVE MAMMARY CARCINOMA of the LEFT breast, 1:30 o'clock, 7 cmfn , (coil clip). This was found to be concordant by Dr. Quincy Carnes. Pathology revealed: METASTATIC CARCINOMA TO A LYMPH NODE of the LEFT axilla, (HydroMark spiral clip). This was found to be concordant by Dr. Quincy Carnes. Pathology revealed: GRADE III INVASIVE MAMMARY CARCINOMA of the LEFT breast, upper outer quadrant distortion, (X clip). This was found to be concordant by Dr. Quincy Carnes. Pathology results were discussed with the patient by telephone. The patient reported doing well after the biopsies with tenderness and bleeding at the sites. Post biopsy instructions and care were reviewed and questions were answered. The patient was encouraged to call [facility] for any additional concerns. My direct phone number was provided. Surgical consultation has been arranged with Dr. Chevis Pretty at [facility] on April 05, 2023. Recommendation: Bilateral breast MRI for extent of disease, concerned for lobular histology. Pathology results reported by Rene Kocher, RN on 03/31/2023. Electronically Signed   By: Hulan Saas M.D.   On: 03/31/2023 13:30   Result Date: 03/31/2023 CLINICAL DATA:  77 year old with screening detected architectural distortion and  subtle developing asymmetry in the UPPER OUTER QUADRANT of the LEFT breast at middle depth, a 0.5 cm mass in the UPPER OUTER QUADRANT of the LEFT breast, and a solitary abnormal LEFT axillary lymph node. Personal history of remote excisional biopsy from the UPPER INNER QUADRANT of the LEFT breast 20+ years ago. EXAM: ULTRASOUND GUIDED LEFT BREAST CORE NEEDLE BIOPSY (x 2) STEREOTACTIC MAMMOGRAPHIC GUIDED CORE NEEDLE BIOPSY OF THE LEFT BREAST COMPARISON:  Previous exam(s). PROCEDURE: I met with the patient and we discussed the procedure of ultrasound-guided biopsy, including benefits and alternatives. We discussed the high likelihood of a successful procedure. We discussed the risks of the procedure, including infection, bleeding, tissue injury, clip migration, and inadequate sampling. Informed written consent was given. The usual time-out protocol was performed immediately prior to the procedure. #1) LEFT breast mass, lesion quadrant: UPPER OUTER QUADRANT. Using sterile technique with chlorhexidine as skin antisepsis, 1% lidocaine and 1% lidocaine with epinephrine as local anesthetic, under direct ultrasound visualization, a 12 gauge Bard Marquee core needle device placed through an 11 gauge introducer needle was used to perform biopsy of the mass in the UPPER OUTER QUADRANT using a lateral approach. At the conclusion of the procedure, a coil shaped tissue marker clip was deployed into the biopsy cavity. # 2) LEFT axillary lymph node: Using sterile technique with chlorhexidine as skin antisepsis, 1% lidocaine and 1% lidocaine with epinephrine as local anesthetic, under direct ultrasound visualization, a 14 gauge Bard Marquee core needle device placed through a 13 gauge introducer was used to perform biopsy of the abnormal lymph node using an inferolateral approach. At the conclusion of the procedure, a HydroMark spiral shaped tissue marking clip was placed into the biopsy cavity. # 3) LEFT breast distortion, lesion  quadrant: UPPER OUTER QUADRANT. Using sterile technique with chlorhexidine as skin antisepsis, 1% lidocaine and 1% lidocaine with epinephrine as local anesthetic, using stereotactic tomosynthesis mammographic guidance, a 9 gauge vacuum assisted Brevera core needle device was used to perform biopsy of the distortion in the UPPER OUTER QUADRANT using a superior approach. At the conclusion of the procedure, an X shaped tissue marking clip was placed into the biopsy cavity. The patient tolerated the procedures well without apparent immediate complications. Follow up 2 view mammogram was performed confirm clip placement and  was dictated separately. IMPRESSION: 1. Ultrasound guided core needle biopsy of a 0.5 cm mass in the UPPER OUTER QUADRANT of the LEFT breast. 2. Ultrasound guided core needle biopsy of a solitary abnormal LEFT axillary lymph. 3. Stereotactic tomosynthesis guided core needle biopsy of architectural distortion in the UPPER OUTER QUADRANT of the LEFT breast at middle depth. Electronically Signed: By: Hulan Saas M.D. On: 03/30/2023 09:17   [place] LT BREAST BX W LOC DEV EA ADD LESION IMG BX SPEC US GUIDE  Addendum Date: 03/31/2023   ADDENDUM REPORT: 03/31/2023 13:30 ADDENDUM: Pathology revealed: GRADE III INVASIVE MAMMARY CARCINOMA of the LEFT breast, 1:30 o'clock, 7 cmfn , (coil clip). This was found to be concordant by Dr. Quincy Carnes. Pathology revealed: METASTATIC CARCINOMA TO A LYMPH NODE of the LEFT axilla, (HydroMark spiral clip). This was found to be concordant by Dr. Quincy Carnes. Pathology revealed: GRADE III INVASIVE MAMMARY CARCINOMA of the LEFT breast, upper outer quadrant distortion, (X clip). This was found to be concordant by Dr. Quincy Carnes. Pathology results were discussed with the patient by telephone. The patient reported doing well after the biopsies with tenderness and bleeding at the sites. Post biopsy instructions and care were reviewed and questions were answered. The  patient was encouraged to call [facility] for any additional concerns. My direct phone number was provided. Surgical consultation has been arranged with Dr. Chevis Pretty at [facility] on April 05, 2023. Recommendation: Bilateral breast MRI for extent of disease, concerned for lobular histology. Pathology results reported by Rene Kocher, RN on 03/31/2023. Electronically Signed   By: Hulan Saas M.D.   On: 03/31/2023 13:30   Result Date: 03/31/2023 CLINICAL DATA:  77 year old with screening detected architectural distortion and subtle developing asymmetry in the UPPER OUTER QUADRANT of the LEFT breast at middle depth, a 0.5 cm mass in the UPPER OUTER QUADRANT of the LEFT breast, and a solitary abnormal LEFT axillary lymph node. Personal history of remote excisional biopsy from the UPPER INNER QUADRANT of the LEFT breast 20+ years ago. EXAM: ULTRASOUND GUIDED LEFT BREAST CORE NEEDLE BIOPSY (x 2) STEREOTACTIC MAMMOGRAPHIC GUIDED CORE NEEDLE BIOPSY OF THE LEFT BREAST COMPARISON:  Previous exam(s). PROCEDURE: I met with the patient and we discussed the procedure of ultrasound-guided biopsy, including benefits and alternatives. We discussed the high likelihood of a successful procedure. We discussed the risks of the procedure, including infection, bleeding, tissue injury, clip migration, and inadequate sampling. Informed written consent was given. The usual time-out protocol was performed immediately prior to the procedure. #1) LEFT breast mass, lesion quadrant: UPPER OUTER QUADRANT. Using sterile technique with chlorhexidine as skin antisepsis, 1% lidocaine and 1% lidocaine with epinephrine as local anesthetic, under direct ultrasound visualization, a 12 gauge Bard Marquee core needle device placed through an 11 gauge introducer needle was used to perform biopsy of the mass in the UPPER OUTER QUADRANT using a lateral approach. At the conclusion of the procedure, a coil  shaped tissue marker clip was deployed into the biopsy cavity. # 2) LEFT axillary lymph node: Using sterile technique with chlorhexidine as skin antisepsis, 1% lidocaine and 1% lidocaine with epinephrine as local anesthetic, under direct ultrasound visualization, a 14 gauge Bard Marquee core needle device placed through a 13 gauge introducer was used to perform biopsy of the abnormal lymph node using an inferolateral approach. At the conclusion of the procedure, a HydroMark spiral shaped tissue marking clip was placed into the biopsy cavity. # 3) LEFT  breast distortion, lesion quadrant: UPPER OUTER QUADRANT. Using sterile technique with chlorhexidine as skin antisepsis, 1% lidocaine and 1% lidocaine with epinephrine as local anesthetic, using stereotactic tomosynthesis mammographic guidance, a 9 gauge vacuum assisted Brevera core needle device was used to perform biopsy of the distortion in the UPPER OUTER QUADRANT using a superior approach. At the conclusion of the procedure, an X shaped tissue marking clip was placed into the biopsy cavity. The patient tolerated the procedures well without apparent immediate complications. Follow up 2 view mammogram was performed confirm clip placement and was dictated separately. IMPRESSION: 1. Ultrasound guided core needle biopsy of a 0.5 cm mass in the UPPER OUTER QUADRANT of the LEFT breast. 2. Ultrasound guided core needle biopsy of a solitary abnormal LEFT axillary lymph. 3. Stereotactic tomosynthesis guided core needle biopsy of architectural distortion in the UPPER OUTER QUADRANT of the LEFT breast at middle depth. Electronically Signed: By: Hulan Saas M.D. On: 03/30/2023 09:17   MM LT BREAST BX W LOC DEV 1ST LESION IMAGE BX SPEC STEREO GUIDE  Addendum Date: 03/31/2023   ADDENDUM REPORT: 03/31/2023 13:30 ADDENDUM: Pathology revealed: GRADE III INVASIVE MAMMARY CARCINOMA of the LEFT breast, 1:30 o'clock, 7 cmfn , (coil clip). This was found to be concordant by Dr.  Quincy Carnes. Pathology revealed: METASTATIC CARCINOMA TO A LYMPH NODE of the LEFT axilla, (HydroMark spiral clip). This was found to be concordant by Dr. Quincy Carnes. Pathology revealed: GRADE III INVASIVE MAMMARY CARCINOMA of the LEFT breast, upper outer quadrant distortion, (X clip). This was found to be concordant by Dr. Quincy Carnes. Pathology results were discussed with the patient by telephone. The patient reported doing well after the biopsies with tenderness and bleeding at the sites. Post biopsy instructions and care were reviewed and questions were answered. The patient was encouraged to call [facility] for any additional concerns. My direct phone number was provided. Surgical consultation has been arranged with Dr. Chevis Pretty at [facility] on April 05, 2023. Recommendation: Bilateral breast MRI for extent of disease, concerned for lobular histology. Pathology results reported by Rene Kocher, RN on 03/31/2023. Electronically Signed   By: Hulan Saas M.D.   On: 03/31/2023 13:30   Result Date: 03/31/2023 CLINICAL DATA:  77 year old with screening detected architectural distortion and subtle developing asymmetry in the UPPER OUTER QUADRANT of the LEFT breast at middle depth, a 0.5 cm mass in the UPPER OUTER QUADRANT of the LEFT breast, and a solitary abnormal LEFT axillary lymph node. Personal history of remote excisional biopsy from the UPPER INNER QUADRANT of the LEFT breast 20+ years ago. EXAM: ULTRASOUND GUIDED LEFT BREAST CORE NEEDLE BIOPSY (x 2) STEREOTACTIC MAMMOGRAPHIC GUIDED CORE NEEDLE BIOPSY OF THE LEFT BREAST COMPARISON:  Previous exam(s). PROCEDURE: I met with the patient and we discussed the procedure of ultrasound-guided biopsy, including benefits and alternatives. We discussed the high likelihood of a successful procedure. We discussed the risks of the procedure, including infection, bleeding, tissue injury, clip migration, and  inadequate sampling. Informed written consent was given. The usual time-out protocol was performed immediately prior to the procedure. #1) LEFT breast mass, lesion quadrant: UPPER OUTER QUADRANT. Using sterile technique with chlorhexidine as skin antisepsis, 1% lidocaine and 1% lidocaine with epinephrine as local anesthetic, under direct ultrasound visualization, a 12 gauge Bard Marquee core needle device placed through an 11 gauge introducer needle was used to perform biopsy of the mass in the UPPER OUTER QUADRANT using a lateral approach. At the  conclusion of the procedure, a coil shaped tissue marker clip was deployed into the biopsy cavity. # 2) LEFT axillary lymph node: Using sterile technique with chlorhexidine as skin antisepsis, 1% lidocaine and 1% lidocaine with epinephrine as local anesthetic, under direct ultrasound visualization, a 14 gauge Bard Marquee core needle device placed through a 13 gauge introducer was used to perform biopsy of the abnormal lymph node using an inferolateral approach. At the conclusion of the procedure, a HydroMark spiral shaped tissue marking clip was placed into the biopsy cavity. # 3) LEFT breast distortion, lesion quadrant: UPPER OUTER QUADRANT. Using sterile technique with chlorhexidine as skin antisepsis, 1% lidocaine and 1% lidocaine with epinephrine as local anesthetic, using stereotactic tomosynthesis mammographic guidance, a 9 gauge vacuum assisted Brevera core needle device was used to perform biopsy of the distortion in the UPPER OUTER QUADRANT using a superior approach. At the conclusion of the procedure, an X shaped tissue marking clip was placed into the biopsy cavity. The patient tolerated the procedures well without apparent immediate complications. Follow up 2 view mammogram was performed confirm clip placement and was dictated separately. IMPRESSION: 1. Ultrasound guided core needle biopsy of a 0.5 cm mass in the UPPER OUTER QUADRANT of the LEFT breast. 2.  Ultrasound guided core needle biopsy of a solitary abnormal LEFT axillary lymph. 3. Stereotactic tomosynthesis guided core needle biopsy of architectural distortion in the UPPER OUTER QUADRANT of the LEFT breast at middle depth. Electronically Signed: By: Hulan Saas M.D. On: 03/30/2023 09:17   MM CLIP PLACEMENT LEFT  Result Date: 03/30/2023 CLINICAL DATA:  Confirmation of clip placement after ultrasound-guided core needle biopsy of a LEFT breast mass, an abnormal LEFT axillary lymph node, and stereotactic tomosynthesis core needle biopsy of architectural distortion in the LEFT breast. EXAM: 2D and 3D DIAGNOSTIC LEFT MAMMOGRAM POST ULTRASOUND and STEREOTACTIC BIOPSY COMPARISON:  Previous exam(s). FINDINGS: 2D and 3D full field CC, laterally exaggerated CC and mediolateral mammographic images were obtained following ultrasound guided biopsy of of the LEFT breast mass and an abnormal LEFT axillary lymph node and stereotactic tomosynthesis guided biopsy of LEFT breast architectural distortion. The coil shaped marking clip is appropriately positioned within the biopsied mass in the UPPER OUTER QUADRANT at middle depth. The [facility] spiral shaped tissue marking clip is appropriately positioned immediately adjacent to the biopsied lymph node in the LEFT axilla. The X shaped tissue marking clip is appropriately positioned at the anterior margin of the biopsied architectural distortion in the UPPER OUTER QUADRANT at middle depth. Expected post biopsy hemorrhage is present at all 3 sites without evidence of hematoma. IMPRESSION: 1. Appropriate positioning of the coil shaped tissue marking clip within the biopsied mass in the UPPER OUTER QUADRANT of the LEFT breast at middle depth. 2. Appropriate positioning of the HydroMark spiral shaped tissue marking clip immediately adjacent to the biopsied lymph node in the LEFT axilla. 3. Appropriate positioning of the X shaped tissue marking clip at the anterior margin of the  biopsied architectural distortion in the UPPER OUTER QUADRANT of the LEFT breast at middle depth. Final Assessment: Post Procedure Mammograms for Marker Placement Electronically Signed   By: Hulan Saas M.D.   On: 03/30/2023 09:15   MM 3D DIAGNOSTIC MAMMOGRAM BILATERAL BREAST  Result Date: 03/22/2023 CLINICAL DATA:  77 year old female with a palpable area of concern in the left axilla. History of remote benign left breast excision. EXAM: DIGITAL DIAGNOSTIC BILATERAL MAMMOGRAM WITH TOMOSYNTHESIS AND CAD; ULTRASOUND LEFT BREAST LIMITED TECHNIQUE: Bilateral digital diagnostic mammography  and breast tomosynthesis was performed. The images were evaluated with computer-aided detection. ; Targeted ultrasound examination of the left breast was performed. COMPARISON:  Previous exam(s). ACR Breast Density Category b: There are scattered areas of fibroglandular density. FINDINGS: No suspicious masses or calcifications are seen in the right breast. There is a persistent area subtle distortion in the upper slightly outer left breast. There is an oval circumscribed mass in the upper-outer left breast measuring 0.5 cm. Spot compression tomograms were performed at the site of palpable concern in the left axilla demonstrating an oval mass versus 2 oval masses likely representing lymph nodes, increased in compared to the prior exam. Targeted ultrasound of the left axilla was performed. There is an abnormal lymph node with increased cortical thickening in the left axilla measuring 1.7 x 1.1 x 1.5 cm, with a cortex thickness of 0.6 cm. This corresponds with the site of palpable concern in the lymph nodes seen in the left axilla on mammography. There is an oval circumscribed hypoechoic mass in the left breast at the 1:30 position 7 cm from nipple measuring 0.5 cm, demonstrating imaging features also suggestive of an intramammary lymph node. Targeted ultrasound of the upper/upper outer left breast was performed. There is no  definite sonographic correlate for the subtle distortion seen in the upper-outer left breast at mammography. IMPRESSION: 1. Abnormal lymph node at site of palpable concern in the left axilla. 2. Indeterminate 0.5 cm mass in the left breast at the 1:30 position, possibly an intramammary lymph node. 3. Persistent subtle distortion in the upper slightly outer left breast. RECOMMENDATION: 1. Recommend ultrasound-guided core biopsy of the abnormal lymph node in the left axilla as well as the 0.5 cm mass in the left breast at the 1:30 position. 2. Recommend stereotactic guided biopsy of the subtle distortion in the upper slightly outer left breast. I have discussed the findings and recommendations with the patient. If applicable, a reminder letter will be sent to the patient regarding the next appointment. BI-RADS CATEGORY  4: Suspicious. Electronically Signed   By: Edwin Cap M.D.   On: 03/22/2023 11:48   [place] LIMITED ULTRASOUND INCLUDING AXILLA LEFT BREAST   Result Date: 03/22/2023 CLINICAL DATA:  77 year old female with a palpable area of concern in the left axilla. History of remote benign left breast excision. EXAM: DIGITAL DIAGNOSTIC BILATERAL MAMMOGRAM WITH TOMOSYNTHESIS AND CAD; ULTRASOUND LEFT BREAST LIMITED TECHNIQUE: Bilateral digital diagnostic mammography and breast tomosynthesis was performed. The images were evaluated with computer-aided detection. ; Targeted ultrasound examination of the left breast was performed. COMPARISON:  Previous exam(s). ACR Breast Density Category b: There are scattered areas of fibroglandular density. FINDINGS: No suspicious masses or calcifications are seen in the right breast. There is a persistent area subtle distortion in the upper slightly outer left breast. There is an oval circumscribed mass in the upper-outer left breast measuring 0.5 cm. Spot compression tomograms were performed at the site of palpable concern in the left axilla demonstrating an oval mass versus 2  oval masses likely representing lymph nodes, increased in compared to the prior exam. Targeted ultrasound of the left axilla was performed. There is an abnormal lymph node with increased cortical thickening in the left axilla measuring 1.7 x 1.1 x 1.5 cm, with a cortex thickness of 0.6 cm. This corresponds with the site of palpable concern in the lymph nodes seen in the left axilla on mammography. There is an oval circumscribed hypoechoic mass in the left breast at the 1:30 position 7 cm  from nipple measuring 0.5 cm, demonstrating imaging features also suggestive of an intramammary lymph node. Targeted ultrasound of the upper/upper outer left breast was performed. There is no definite sonographic correlate for the subtle distortion seen in the upper-outer left breast at mammography. IMPRESSION: 1. Abnormal lymph node at site of palpable concern in the left axilla. 2. Indeterminate 0.5 cm mass in the left breast at the 1:30 position, possibly an intramammary lymph node. 3. Persistent subtle distortion in the upper slightly outer left breast. RECOMMENDATION: 1. Recommend ultrasound-guided core biopsy of the abnormal lymph node in the left axilla as well as the 0.5 cm mass in the left breast at the 1:30 position. 2. Recommend stereotactic guided biopsy of the subtle distortion in the upper slightly outer left breast. I have discussed the findings and recommendations with the patient. If applicable, a reminder letter will be sent to the patient regarding the next appointment. BI-RADS CATEGORY  4: Suspicious. Electronically Signed   By: Edwin Cap M.D.   On: 03/22/2023 11:48       IMPRESSION/PLAN: 1. 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 Dr. Mitzi Hansen discusses the pathology findings and reviews the nature of left node positive breast disease.  Dr. Pamelia Hoit recommends Oncotype DX score to determine the role of systemic therapy.  Dr. Mitzi Hansen also recommends external  radiotherapy to the breast  to reduce risks of local recurrence followed by antiestrogen therapy. We discussed the risks, benefits, short, and long term effects of radiotherapy, as well as the curative intent, and the patient is interested in proceeding. Dr. Mitzi Hansen discusses the delivery and logistics of radiotherapy and anticipates a course of 6-1/2 weeks of radiotherapy to the left breast and regional lymph nodes. We will see her back a few weeks after surgery and or completion of systemic treatment if needed to discuss the simulation process and anticipate we starting radiotherapy about 4-6 weeks thereafter. 2. Possible genetic predisposition to malignancy. The patient is a candidate for genetic testing given *** personal and family history. She will meet with our geneticist today in clinic.   In a visit lasting *** minutes, greater than 50% of the time was spent face to face reviewing her case, as well as in preparation of, discussing, and coordinating the patient's care.  The above documentation reflects my direct findings during this shared patient visit. Please see the separate note by Dr. Mitzi Hansen on this date for the remainder of the patient's plan of care.    Osker Mason, [facility]    **Disclaimer: This note was dictated with voice recognition software. Similar sounding words can inadvertently be transcribed and this note may contain transcription errors which may not have been corrected upon publication of note.**

## 2023-04-11 NOTE — Progress Notes (Signed)
New Breast Cancer Diagnosis: Left Breast   Did patient present with symptoms (if so, please note symptoms) or screening mammography?: She presented with a palpable abnormality in the left axillary region.  Location and Extent of disease :left breast. Located at 1:30 position, measured 0.5 cm in greatest dimension. Adenopathy yes.  MRI Breast 04/09/2023:  Non mass enhancement is seen surrounding both the coil shaped post biopsy marker clip and the X shaped post biopsy marker clip in the left breast. This non mass enhancement extends between the clips, medial to the X clip and posterior to the X, overall span of abnormal enhancement being 6.4 x 4.8 x 5.0 cm. This is all suspicious for additional malignancy.  No evidence of right breast malignancy. 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 No other abnormal axillary lymph nodes.    Histology per Pathology Report: grade 3, Invasive Lobular Carcinoma 03/30/2023  Receptor Status: ER(positive), PR (positive), Her2-neu (negative), Ki-(50%)   Surgeon and surgical plan, if any:  Dr. Carolynne Edouard 04/05/2023 -At this point given the lobular nature of the cancer we will evaluate it further with MRI.  -I will make her referrals to medical and radiation oncology to discuss adjuvant versus neoadjuvant therapy.  -Once we have the results of the MRI then we will have a better idea of whether she would be a candidate for breast conservation versus mastectomy.  -If she is a candidate for neoadjuvant therapy then it may shrink the lymph node. If she is not then we would likely plan for targeted node dissection with sentinel node mapping at the time of her definitive surgery and then use that information to make any final decisions on whether she would need a full lymph node dissection or not.  -Surgery unscheduled at this time   Medical oncologist, treatment if any:   Dr. Pamelia Hoit 04/07/2023 -Recommendations: 1. Breast conserving surgery followed by 2. Oncotype DX  testing to determine if chemotherapy would be of any benefit followed by 3. Adjuvant radiation therapy followed by 4. Adjuvant antiestrogen therapy.   Family History of Breast/Ovarian/Prostate Cancer: Mother had ovarian cancer.  Lymphedema issues, if any: She reports mild swelling to the breast at the biopsy site.     Pain issues, if any: None    SAFETY ISSUES: Prior radiation? No Pacemaker/ICD? No Possible current pregnancy? Hysterectomy Is the patient on methotrexate? No  Current Complaints / other details:

## 2023-04-12 ENCOUNTER — Ambulatory Visit
Admission: RE | Admit: 2023-04-12 | Discharge: 2023-04-12 | Disposition: A | Discharge status: Home / Self Care | Payer: Medicare HMO | Source: Ambulatory Visit | Attending: Radiation Oncology | Admitting: Radiation Oncology

## 2023-04-12 ENCOUNTER — Encounter: Payer: Self-pay | Admitting: Radiation Oncology

## 2023-04-12 VITALS — BP 151/87 | HR 77 | Temp 97.5°F | Resp 18 | Ht 68.0 in | Wt 154.1 lb

## 2023-04-12 DIAGNOSIS — Z170 Estrogen receptor positive status [ER+]: Secondary | ICD-10-CM

## 2023-04-12 DIAGNOSIS — I10 Essential (primary) hypertension: Secondary | ICD-10-CM | POA: Insufficient documentation

## 2023-04-12 DIAGNOSIS — D472 Monoclonal gammopathy: Secondary | ICD-10-CM | POA: Insufficient documentation

## 2023-04-12 DIAGNOSIS — C50412 Malignant neoplasm of upper-outer quadrant of left female breast: Secondary | ICD-10-CM | POA: Diagnosis not present

## 2023-04-12 DIAGNOSIS — K760 Fatty (change of) liver, not elsewhere classified: Secondary | ICD-10-CM | POA: Diagnosis not present

## 2023-04-12 DIAGNOSIS — E119 Type 2 diabetes mellitus without complications: Secondary | ICD-10-CM | POA: Diagnosis not present

## 2023-04-12 DIAGNOSIS — Z809 Family history of malignant neoplasm, unspecified: Secondary | ICD-10-CM | POA: Diagnosis not present

## 2023-04-12 DIAGNOSIS — Z87891 Personal history of nicotine dependence: Secondary | ICD-10-CM | POA: Diagnosis not present

## 2023-04-12 DIAGNOSIS — Z79899 Other long term (current) drug therapy: Secondary | ICD-10-CM | POA: Diagnosis not present

## 2023-04-13 ENCOUNTER — Encounter: Payer: Self-pay | Admitting: *Deleted

## 2023-04-13 ENCOUNTER — Other Ambulatory Visit: Payer: Self-pay | Admitting: General Surgery

## 2023-04-13 DIAGNOSIS — R928 Other abnormal and inconclusive findings on diagnostic imaging of breast: Secondary | ICD-10-CM

## 2023-04-14 ENCOUNTER — Ambulatory Visit
Admission: RE | Admit: 2023-04-14 | Discharge: 2023-04-14 | Disposition: A | Discharge status: Home / Self Care | Payer: Medicare HMO | Source: Ambulatory Visit | Attending: General Surgery | Admitting: General Surgery

## 2023-04-14 ENCOUNTER — Other Ambulatory Visit (HOSPITAL_COMMUNITY): Payer: Self-pay | Admitting: Diagnostic Radiology

## 2023-04-14 DIAGNOSIS — R928 Other abnormal and inconclusive findings on diagnostic imaging of breast: Secondary | ICD-10-CM

## 2023-04-14 DIAGNOSIS — C50112 Malignant neoplasm of central portion of left female breast: Secondary | ICD-10-CM | POA: Diagnosis not present

## 2023-04-14 MED ORDER — GADOPICLENOL 0.5 MMOL/ML IV SOLN
7.0000 mL | Freq: Once | INTRAVENOUS | Status: AC | PRN
  Administered 2023-04-14: 7 mL via INTRAVENOUS

## 2023-04-15 ENCOUNTER — Inpatient Hospital Stay: Payer: Medicare HMO

## 2023-04-15 LAB — SURGICAL PATHOLOGY

## 2023-04-15 NOTE — Progress Notes (Signed)
[facility] Clinical Social Work  Clinical Social Work was referred by medical provider for assessment of psychosocial needs.  Clinical Social Worker contacted patient by phone to offer support and assess for needs. CSW introduced herself and discussed the supports available through the cancer center (Breast Cancer support group, fall garden club, and cooking classes). CSW received verbal authorization to email patient digital PDFs of support services. CSW assessed and discussed SDOH concerns present (food and utilities).  Patient is on a plant based diet and can this can cause challenges. Patient and CSW discussed SNAP food benefits, patient will attempt to reapply for SNAP benefits despite small allowance to cover costs of produce. CSW explained financial assistance available should patient need it. Patient has CSWs direct number, CSW will follow up with patient in two weeks to check on treatment.   Marguerita Merles, LCSWA Clinical Social Worker [facility]

## 2023-04-18 ENCOUNTER — Encounter: Payer: Self-pay | Admitting: *Deleted

## 2023-04-20 ENCOUNTER — Encounter: Payer: Self-pay | Admitting: Internal Medicine

## 2023-04-20 ENCOUNTER — Ambulatory Visit: Payer: Medicare HMO | Admitting: Internal Medicine

## 2023-04-25 ENCOUNTER — Ambulatory Visit: Payer: Self-pay | Admitting: General Surgery

## 2023-04-25 DIAGNOSIS — Z170 Estrogen receptor positive status [ER+]: Secondary | ICD-10-CM

## 2023-04-25 MED ORDER — KETOROLAC TROMETHAMINE 15 MG/ML IJ SOLN: 15.0000 mg | INTRAMUSCULAR | Status: AC

## 2023-04-27 ENCOUNTER — Other Ambulatory Visit: Payer: Self-pay | Admitting: General Surgery

## 2023-04-27 DIAGNOSIS — Z170 Estrogen receptor positive status [ER+]: Secondary | ICD-10-CM

## 2023-04-29 ENCOUNTER — Encounter: Payer: Self-pay | Admitting: *Deleted

## 2023-05-03 NOTE — Progress Notes (Signed)
Surgical Instructions   Your procedure is scheduled on Friday May 06, 2023. Report to [facility] Main Entrance "A" at 9:30 A.M., then check in with the Admitting office. Any questions or running late day of surgery: call [phone]  Questions prior to your surgery date: call [phone], Monday-Friday, 8am-4pm. If you experience any cold or flu symptoms such as cough, fever, chills, shortness of breath, etc. between now and your scheduled surgery, please notify us at the above number.     Remember:  Do not eat after midnight the night before your surgery   You may drink clear liquids until 8:30 the morning of your surgery.   Clear liquids allowed are: Water, Non-Citrus Juices (without pulp), Carbonated Beverages, Clear Tea, Black Coffee Only (NO MILK, CREAM OR POWDERED CREAMER of any kind), and Gatorade.    Take these medicines the morning of surgery with A SIP OF WATER None  One week prior to surgery, STOP taking any Aspirin (unless otherwise instructed by your surgeon) Aleve, Naproxen, Ibuprofen, Motrin, Advil, Goody's, BC's, all herbal medications, fish oil, and non-prescription vitamins.    WHAT DO I DO ABOUT MY DIABETES MEDICATION?   Do not take oral diabetes medicines (pills) the morning of surgery.        DO NOT TAKE YOUR metFORMIN (GLUCOPHAGE) THE DAY OF SURGERY.   The day of surgery, do not take other diabetes injectables, including Byetta (exenatide), Bydureon (exenatide ER), Victoza (liraglutide), or Trulicity (dulaglutide).  If your CBG is greater than 220 mg/dL, you may take  of your sliding scale (correction) dose of insulin.   HOW TO MANAGE YOUR DIABETES BEFORE AND AFTER SURGERY  Why is it important to control my blood sugar before and after surgery? Improving blood sugar levels before and after surgery helps healing and can limit problems. A way of improving blood sugar control is eating a healthy diet by:  Eating less sugar and carbohydrates   Increasing activity/exercise  Talking with your doctor about reaching your blood sugar goals High blood sugars (greater than 180 mg/dL) can raise your risk of infections and slow your recovery, so you will need to focus on controlling your diabetes during the weeks before surgery. Make sure that the doctor who takes care of your diabetes knows about your planned surgery including the date and location.  How do I manage my blood sugar before surgery? Check your blood sugar at least 4 times a day, starting 2 days before surgery, to make sure that the level is not too high or low.  Check your blood sugar the morning of your surgery when you wake up and every 2 hours until you get to the Short Stay unit.  If your blood sugar is less than 70 mg/dL, you will need to treat for low blood sugar: Do not take insulin. Treat a low blood sugar (less than 70 mg/dL) with  cup of clear juice (cranberry or apple), 4 glucose tablets, OR glucose gel. Recheck blood sugar in 15 minutes after treatment (to make sure it is greater than 70 mg/dL). If your blood sugar is not greater than 70 mg/dL on recheck, call [phone] for further instructions. Report your blood sugar to the short stay nurse when you get to Short Stay.  If you are admitted to the hospital after surgery: Your blood sugar will be checked by the staff and you will probably be given insulin after surgery (instead of oral diabetes medicines) to make sure you have good blood sugar levels. The  goal for blood sugar control after surgery is 80-180 mg/dL.                     Do NOT Smoke (Tobacco/Vaping) for 24 hours prior to your procedure.  If you use a CPAP at night, you may bring your mask/headgear for your overnight stay.   You will be asked to remove any contacts, glasses, piercing's, hearing aid's, dentures/partials prior to surgery. Please bring cases for these items if needed.    Patients discharged the day of surgery will not be allowed to  drive home, and someone needs to stay with them for 24 hours.  SURGICAL WAITING ROOM VISITATION Patients may have no more than 2 support people in the waiting area - these visitors may rotate.   Pre-op nurse will coordinate an appropriate time for 1 ADULT support person, who may not rotate, to accompany patient in pre-op.  Children under the age of 7 must have an adult with them who is not the patient and must remain in the main waiting area with an adult.  If the patient needs to stay at the hospital during part of their recovery, the visitor guidelines for inpatient rooms apply.  Please refer to the [facility] website for the visitor guidelines for any additional information.   If you received a COVID test during your pre-op visit  it is requested that you wear a mask when out in public, stay away from anyone that may not be feeling well and notify your surgeon if you develop symptoms. If you have been in contact with anyone that has tested positive in the last 10 days please notify you surgeon.      Pre-operative CHG Bathing Instructions   You can play a key role in reducing the risk of infection after surgery. Your skin needs to be as free of germs as possible. You can reduce the number of germs on your skin by washing with CHG (chlorhexidine gluconate) soap before surgery. CHG is an antiseptic soap that kills germs and continues to kill germs even after washing.   DO NOT use if you have an allergy to chlorhexidine/CHG or antibacterial soaps. If your skin becomes reddened or irritated, stop using the CHG and notify one of our RNs at [phone].              TAKE A SHOWER THE NIGHT BEFORE SURGERY AND THE DAY OF SURGERY    Please keep in mind the following:  DO NOT shave, including legs and underarms, 48 hours prior to surgery.   You may shave your face before/day of surgery.  Place clean sheets on your bed the night before surgery Use a clean washcloth (not used since being  washed) for each shower. DO NOT sleep with pet's night before surgery.  CHG Shower Instructions:  Wash your face and private area with normal soap. If you choose to wash your hair, wash first with your normal shampoo.  After you use shampoo/soap, rinse your hair and body thoroughly to remove shampoo/soap residue.  Turn the water OFF and apply half the bottle of CHG soap to a CLEAN washcloth.  Apply CHG soap ONLY FROM YOUR NECK DOWN TO YOUR TOES (washing for 3-5 minutes)  DO NOT use CHG soap on face, private areas, open wounds, or sores.  Pay special attention to the area where your surgery is being performed.  If you are having back surgery, having someone wash your back for you may be  helpful. Wait 2 minutes after CHG soap is applied, then you may rinse off the CHG soap.  Pat dry with a clean towel  Put on clean pajamas    Additional instructions for the day of surgery: DO NOT APPLY any lotions, deodorants or perfumes.   Do not wear jewelry or makeup Do not wear nail polish, gel polish, artificial nails, or any other type of covering on natural nails (fingers and toes) Do not bring valuables to the hospital. [facility] is not responsible for valuables/personal belongings. Put on clean/comfortable clothes.  Please brush your teeth.  Ask your nurse before applying any prescription medications to the skin.

## 2023-05-04 ENCOUNTER — Other Ambulatory Visit: Payer: Self-pay

## 2023-05-04 ENCOUNTER — Encounter (HOSPITAL_COMMUNITY)
Admission: RE | Admit: 2023-05-04 | Discharge: 2023-05-04 | Disposition: A | Discharge status: Home / Self Care | Payer: Medicare HMO | Source: Ambulatory Visit | Attending: General Surgery | Admitting: General Surgery

## 2023-05-04 ENCOUNTER — Encounter (HOSPITAL_COMMUNITY): Payer: Self-pay | Admitting: General Surgery

## 2023-05-04 VITALS — BP 135/75 | HR 71 | Temp 98.3°F | Resp 20 | Ht 68.0 in | Wt 154.4 lb

## 2023-05-04 DIAGNOSIS — Z01818 Encounter for other preprocedural examination: Secondary | ICD-10-CM | POA: Diagnosis not present

## 2023-05-04 DIAGNOSIS — E119 Type 2 diabetes mellitus without complications: Secondary | ICD-10-CM | POA: Insufficient documentation

## 2023-05-04 LAB — BASIC METABOLIC PANEL
Anion gap: 8 (ref 5–15)
BUN: 11 mg/dL (ref 8–23)
CO2: 27 mmol/L (ref 22–32)
Calcium: 9.5 mg/dL (ref 8.9–10.3)
Chloride: 103 mmol/L (ref 98–111)
Creatinine, Ser: 1.01 mg/dL — ABNORMAL HIGH (ref 0.44–1.00)
GFR, Estimated: 57 mL/min — ABNORMAL LOW (ref >60)
Glucose, Bld: 109 mg/dL — ABNORMAL HIGH (ref 70–99)
Potassium: 4.1 mmol/L (ref 3.5–5.1)
Sodium: 138 mmol/L (ref 135–145)

## 2023-05-04 LAB — HEMOGLOBIN A1C
Hgb A1c MFr Bld: 6.4 % — ABNORMAL HIGH (ref 4.8–5.6)
Mean Plasma Glucose: 136.98 mg/dL

## 2023-05-04 LAB — CBC
HCT: 34.5 % — ABNORMAL LOW (ref 36.0–46.0)
Hemoglobin: 10.8 g/dL — ABNORMAL LOW (ref 12.0–15.0)
MCH: 26.7 pg (ref 26.0–34.0)
MCHC: 31.3 g/dL (ref 30.0–36.0)
MCV: 85.4 fL (ref 80.0–100.0)
Platelets: 220 10*3/uL (ref 150–400)
RBC: 4.04 MIL/uL (ref 3.87–5.11)
RDW: 13.3 % (ref 11.5–15.5)
WBC: 5.4 10*3/uL (ref 4.0–10.5)
nRBC: 0 % (ref 0.0–0.2)

## 2023-05-04 LAB — GLUCOSE, CAPILLARY: Glucose-Capillary: 139 mg/dL — ABNORMAL HIGH (ref 70–99)

## 2023-05-04 NOTE — Progress Notes (Addendum)
PCP - Jeannetta Ellis  Cardiologist - Denies  PPM/ICD - denies  Device Orders -  Rep Notified -   Chest x-ray - na EKG - 05/04/23 Stress Test - denies ECHO - denies Cardiac Cath - denies  Sleep Study - denies CPAP - no  Fasting Blood Sugar - 107-110 Checks Blood Sugar _____ times a day  Last dose of GLP1 agonist- na  GLP1 instructions:   Blood Thinner Instructions:na Aspirin Instructions:na  ERAS Protcol - clear liquids until 0830 PRE-SURGERY Ensure or G2-   COVID TEST- na   Anesthesia review: yes- seed patient  Patient denies shortness of breath, fever, cough and chest pain at PAT appointment   All instructions explained to the patient, with a verbal understanding of the material. Patient agrees to go over the instructions while at home for a better understanding. Patient also instructed to wear a mask when out in public prior to surgery. The opportunity to ask questions was provided.

## 2023-05-05 ENCOUNTER — Ambulatory Visit
Admission: RE | Admit: 2023-05-05 | Discharge: 2023-05-05 | Disposition: A | Discharge status: Home / Self Care | Payer: Medicare HMO | Source: Ambulatory Visit | Attending: General Surgery | Admitting: General Surgery

## 2023-05-05 ENCOUNTER — Other Ambulatory Visit: Payer: Self-pay | Admitting: General Surgery

## 2023-05-05 DIAGNOSIS — Z170 Estrogen receptor positive status [ER+]: Secondary | ICD-10-CM

## 2023-05-05 DIAGNOSIS — R590 Localized enlarged lymph nodes: Secondary | ICD-10-CM | POA: Diagnosis not present

## 2023-05-05 HISTORY — PX: BREAST BIOPSY: SHX20

## 2023-05-06 ENCOUNTER — Ambulatory Visit
Admission: RE | Admit: 2023-05-06 | Discharge: 2023-05-06 | Disposition: A | Discharge status: Home / Self Care | Payer: Medicare HMO | Source: Ambulatory Visit | Attending: General Surgery | Admitting: General Surgery

## 2023-05-06 ENCOUNTER — Encounter (HOSPITAL_COMMUNITY)
Admission: RE | Disposition: A | Discharge status: Home / Self Care | Payer: Self-pay | Source: Home / Self Care | Attending: General Surgery

## 2023-05-06 ENCOUNTER — Other Ambulatory Visit: Payer: Self-pay

## 2023-05-06 ENCOUNTER — Ambulatory Visit (HOSPITAL_COMMUNITY)
Admission: RE | Admit: 2023-05-06 | Discharge: 2023-05-07 | Disposition: A | Discharge status: Home / Self Care | Payer: Medicare HMO | Attending: General Surgery | Admitting: General Surgery

## 2023-05-06 ENCOUNTER — Ambulatory Visit (HOSPITAL_COMMUNITY): Payer: Medicare HMO | Admitting: Physician Assistant

## 2023-05-06 ENCOUNTER — Encounter (HOSPITAL_COMMUNITY)
Admission: RE | Admit: 2023-05-06 | Discharge: 2023-05-06 | Disposition: A | Discharge status: Home / Self Care | Payer: Medicare HMO | Source: Ambulatory Visit | Attending: General Surgery | Admitting: General Surgery

## 2023-05-06 ENCOUNTER — Ambulatory Visit (HOSPITAL_COMMUNITY): Payer: Medicare HMO | Admitting: Anesthesiology

## 2023-05-06 DIAGNOSIS — Z23 Encounter for immunization: Secondary | ICD-10-CM | POA: Diagnosis not present

## 2023-05-06 DIAGNOSIS — I129 Hypertensive chronic kidney disease with stage 1 through stage 4 chronic kidney disease, or unspecified chronic kidney disease: Secondary | ICD-10-CM | POA: Diagnosis not present

## 2023-05-06 DIAGNOSIS — E079 Disorder of thyroid, unspecified: Secondary | ICD-10-CM | POA: Insufficient documentation

## 2023-05-06 DIAGNOSIS — C50412 Malignant neoplasm of upper-outer quadrant of left female breast: Secondary | ICD-10-CM | POA: Insufficient documentation

## 2023-05-06 DIAGNOSIS — E1122 Type 2 diabetes mellitus with diabetic chronic kidney disease: Secondary | ICD-10-CM | POA: Diagnosis not present

## 2023-05-06 DIAGNOSIS — Z170 Estrogen receptor positive status [ER+]: Secondary | ICD-10-CM | POA: Insufficient documentation

## 2023-05-06 DIAGNOSIS — Z01818 Encounter for other preprocedural examination: Secondary | ICD-10-CM

## 2023-05-06 DIAGNOSIS — Z800 Family history of malignant neoplasm of digestive organs: Secondary | ICD-10-CM | POA: Insufficient documentation

## 2023-05-06 DIAGNOSIS — N189 Chronic kidney disease, unspecified: Secondary | ICD-10-CM

## 2023-05-06 DIAGNOSIS — Z7984 Long term (current) use of oral hypoglycemic drugs: Secondary | ICD-10-CM | POA: Diagnosis not present

## 2023-05-06 DIAGNOSIS — Z1721 Progesterone receptor positive status: Secondary | ICD-10-CM | POA: Diagnosis not present

## 2023-05-06 DIAGNOSIS — Z87891 Personal history of nicotine dependence: Secondary | ICD-10-CM | POA: Diagnosis not present

## 2023-05-06 DIAGNOSIS — C50912 Malignant neoplasm of unspecified site of left female breast: Secondary | ICD-10-CM

## 2023-05-06 DIAGNOSIS — I10 Essential (primary) hypertension: Secondary | ICD-10-CM | POA: Diagnosis not present

## 2023-05-06 DIAGNOSIS — C773 Secondary and unspecified malignant neoplasm of axilla and upper limb lymph nodes: Secondary | ICD-10-CM | POA: Diagnosis not present

## 2023-05-06 DIAGNOSIS — E119 Type 2 diabetes mellitus without complications: Secondary | ICD-10-CM | POA: Diagnosis not present

## 2023-05-06 DIAGNOSIS — R590 Localized enlarged lymph nodes: Secondary | ICD-10-CM | POA: Diagnosis not present

## 2023-05-06 HISTORY — PX: NODE DISSECTION: SHX5269

## 2023-05-06 HISTORY — PX: SIMPLE MASTECTOMY WITH AXILLARY SENTINEL NODE BIOPSY: SHX6098

## 2023-05-06 LAB — GLUCOSE, CAPILLARY
Glucose-Capillary: 106 mg/dL — ABNORMAL HIGH (ref 70–99)
Glucose-Capillary: 106 mg/dL — ABNORMAL HIGH (ref 70–99)

## 2023-05-06 SURGERY — SIMPLE MASTECTOMY WITH AXILLARY SENTINEL NODE BIOPSY
Anesthesia: Regional | Laterality: Left

## 2023-05-06 MED ORDER — ORAL CARE MOUTH RINSE: 15.0000 mL | Freq: Once | OROMUCOSAL | Status: AC

## 2023-05-06 MED ORDER — TECHNETIUM TC 99M TILMANOCEPT KIT
1.0000 | PACK | Freq: Once | INTRAVENOUS | Status: AC | PRN
  Administered 2023-05-06: 1 via INTRADERMAL

## 2023-05-06 MED ORDER — MORPHINE SULFATE (PF) 2 MG/ML IV SOLN: 1.0000 mg | INTRAVENOUS | Status: DC | PRN

## 2023-05-06 MED ORDER — INFLUENZA VAC A&B SURF ANT ADJ 0.5 ML IM SUSY
0.5000 mL | PREFILLED_SYRINGE | INTRAMUSCULAR | Status: AC
  Administered 2023-05-07: 0.5 mL via INTRAMUSCULAR
  Filled 2023-05-06: qty 0.5

## 2023-05-06 MED ORDER — AMLODIPINE BESYLATE 10 MG PO TABS
10.0000 mg | ORAL_TABLET | Freq: Every day | ORAL | Status: DC
  Administered 2023-05-06: 10 mg via ORAL
  Filled 2023-05-06: qty 1

## 2023-05-06 MED ORDER — BUPIVACAINE-EPINEPHRINE (PF) 0.25% -1:200000 IJ SOLN
INTRAMUSCULAR | Status: AC
  Filled 2023-05-06: qty 30

## 2023-05-06 MED ORDER — FENTANYL CITRATE (PF) 250 MCG/5ML IJ SOLN
INTRAMUSCULAR | Status: DC | PRN
  Administered 2023-05-06: 50 ug via INTRAVENOUS
  Administered 2023-05-06: 100 ug via INTRAVENOUS
  Administered 2023-05-06 (×2): 50 ug via INTRAVENOUS

## 2023-05-06 MED ORDER — ENOXAPARIN SODIUM 30 MG/0.3ML IJ SOSY
30.0000 mg | PREFILLED_SYRINGE | INTRAMUSCULAR | Status: DC
  Administered 2023-05-07: 30 mg via SUBCUTANEOUS
  Filled 2023-05-06: qty 0.3

## 2023-05-06 MED ORDER — ONDANSETRON HCL 4 MG/2ML IJ SOLN: 4.0000 mg | Freq: Once | INTRAMUSCULAR | Status: DC | PRN

## 2023-05-06 MED ORDER — GABAPENTIN 100 MG PO CAPS
100.0000 mg | ORAL_CAPSULE | ORAL | Status: AC
  Administered 2023-05-06: 100 mg via ORAL
  Filled 2023-05-06: qty 1

## 2023-05-06 MED ORDER — ONDANSETRON HCL 4 MG/2ML IJ SOLN
INTRAMUSCULAR | Status: AC
  Filled 2023-05-06: qty 2

## 2023-05-06 MED ORDER — ONDANSETRON HCL 4 MG/2ML IJ SOLN: 4.0000 mg | Freq: Four times a day (QID) | INTRAMUSCULAR | Status: DC | PRN

## 2023-05-06 MED ORDER — ONDANSETRON HCL 4 MG/2ML IJ SOLN
INTRAMUSCULAR | Status: DC | PRN
  Administered 2023-05-06: 4 mg via INTRAVENOUS

## 2023-05-06 MED ORDER — DEXAMETHASONE SODIUM PHOSPHATE 10 MG/ML IJ SOLN
INTRAMUSCULAR | Status: DC | PRN
  Administered 2023-05-06: 10 mg via INTRAVENOUS

## 2023-05-06 MED ORDER — LACTATED RINGERS IV SOLN: INTRAVENOUS | Status: DC

## 2023-05-06 MED ORDER — METHOCARBAMOL 500 MG PO TABS: 500.0000 mg | ORAL_TABLET | Freq: Four times a day (QID) | ORAL | 2 refills | Status: DC | PRN

## 2023-05-06 MED ORDER — METFORMIN HCL 500 MG PO TABS
1000.0000 mg | ORAL_TABLET | Freq: Two times a day (BID) | ORAL | Status: DC
  Administered 2023-05-06 – 2023-05-07 (×2): 1000 mg via ORAL
  Filled 2023-05-06 (×2): qty 2

## 2023-05-06 MED ORDER — SUGAMMADEX SODIUM 200 MG/2ML IV SOLN
INTRAVENOUS | Status: DC | PRN
  Administered 2023-05-06: 200 mg via INTRAVENOUS

## 2023-05-06 MED ORDER — PROPOFOL 10 MG/ML IV BOLUS
INTRAVENOUS | Status: AC
  Filled 2023-05-06: qty 20

## 2023-05-06 MED ORDER — LIDOCAINE 2% (20 MG/ML) 5 ML SYRINGE
INTRAMUSCULAR | Status: DC | PRN
  Administered 2023-05-06: 80 mg via INTRAVENOUS

## 2023-05-06 MED ORDER — ACETAMINOPHEN 500 MG PO TABS
1000.0000 mg | ORAL_TABLET | ORAL | Status: DC
  Filled 2023-05-06: qty 2

## 2023-05-06 MED ORDER — FENTANYL CITRATE (PF) 100 MCG/2ML IJ SOLN: 25.0000 ug | INTRAMUSCULAR | Status: DC | PRN

## 2023-05-06 MED ORDER — FENTANYL CITRATE (PF) 100 MCG/2ML IJ SOLN: 50.0000 ug | Freq: Once | INTRAMUSCULAR | Status: DC

## 2023-05-06 MED ORDER — SODIUM CHLORIDE 0.9 % IV SOLN: INTRAVENOUS | Status: DC

## 2023-05-06 MED ORDER — ROCURONIUM BROMIDE 10 MG/ML (PF) SYRINGE
PREFILLED_SYRINGE | INTRAVENOUS | Status: DC | PRN
  Administered 2023-05-06: 40 mg via INTRAVENOUS

## 2023-05-06 MED ORDER — METHOCARBAMOL 500 MG PO TABS: 500.0000 mg | ORAL_TABLET | Freq: Four times a day (QID) | ORAL | Status: DC | PRN

## 2023-05-06 MED ORDER — PANTOPRAZOLE SODIUM 40 MG IV SOLR
40.0000 mg | Freq: Every day | INTRAVENOUS | Status: DC
  Administered 2023-05-06: 40 mg via INTRAVENOUS
  Filled 2023-05-06: qty 10

## 2023-05-06 MED ORDER — CHLORHEXIDINE GLUCONATE 0.12 % MT SOLN
15.0000 mL | Freq: Once | OROMUCOSAL | Status: AC
  Administered 2023-05-06: 15 mL via OROMUCOSAL
  Filled 2023-05-06: qty 15

## 2023-05-06 MED ORDER — ROPIVACAINE HCL 5 MG/ML IJ SOLN
INTRAMUSCULAR | Status: DC | PRN
  Administered 2023-05-06: 30 mL via PERINEURAL

## 2023-05-06 MED ORDER — LOSARTAN POTASSIUM 50 MG PO TABS
100.0000 mg | ORAL_TABLET | Freq: Every day | ORAL | Status: DC
  Administered 2023-05-06 – 2023-05-07 (×2): 100 mg via ORAL
  Filled 2023-05-06 (×2): qty 2

## 2023-05-06 MED ORDER — OXYCODONE HCL 5 MG PO TABS
5.0000 mg | ORAL_TABLET | Freq: Four times a day (QID) | ORAL | 0 refills | Status: DC | PRN
Start: 2023-05-06 — End: 2023-06-10

## 2023-05-06 MED ORDER — CEFAZOLIN SODIUM-DEXTROSE 2-4 GM/100ML-% IV SOLN
2.0000 g | INTRAVENOUS | Status: AC
  Administered 2023-05-06: 2 g via INTRAVENOUS
  Filled 2023-05-06: qty 100

## 2023-05-06 MED ORDER — LIDOCAINE 2% (20 MG/ML) 5 ML SYRINGE
INTRAMUSCULAR | Status: AC
  Filled 2023-05-06: qty 5

## 2023-05-06 MED ORDER — CHLORHEXIDINE GLUCONATE CLOTH 2 % EX PADS: 6.0000 | MEDICATED_PAD | Freq: Once | CUTANEOUS | Status: DC

## 2023-05-06 MED ORDER — METHYLENE BLUE (ANTIDOTE) 1 % IV SOLN
INTRAVENOUS | Status: AC
  Filled 2023-05-06: qty 10

## 2023-05-06 MED ORDER — PROPOFOL 10 MG/ML IV BOLUS
INTRAVENOUS | Status: DC | PRN
  Administered 2023-05-06: 100 mg via INTRAVENOUS
  Administered 2023-05-06: 50 mg via INTRAVENOUS

## 2023-05-06 MED ORDER — AMISULPRIDE (ANTIEMETIC) 5 MG/2ML IV SOLN: 10.0000 mg | Freq: Once | INTRAVENOUS | Status: DC | PRN

## 2023-05-06 MED ORDER — SUCCINYLCHOLINE CHLORIDE 200 MG/10ML IV SOSY
PREFILLED_SYRINGE | INTRAVENOUS | Status: DC | PRN
  Administered 2023-05-06: 100 mg via INTRAVENOUS
  Administered 2023-05-06: 80 mg via INTRAVENOUS

## 2023-05-06 MED ORDER — FENTANYL CITRATE (PF) 250 MCG/5ML IJ SOLN
INTRAMUSCULAR | Status: AC
  Filled 2023-05-06: qty 5

## 2023-05-06 MED ORDER — DEXAMETHASONE SODIUM PHOSPHATE 10 MG/ML IJ SOLN
INTRAMUSCULAR | Status: AC
  Filled 2023-05-06: qty 1

## 2023-05-06 MED ORDER — INSULIN ASPART 100 UNIT/ML IJ SOLN: 0.0000 [IU] | INTRAMUSCULAR | Status: DC | PRN

## 2023-05-06 MED ORDER — ONDANSETRON 4 MG PO TBDP: 4.0000 mg | ORAL_TABLET | Freq: Four times a day (QID) | ORAL | Status: DC | PRN

## 2023-05-06 MED ORDER — OXYCODONE HCL 5 MG PO TABS
5.0000 mg | ORAL_TABLET | ORAL | Status: DC | PRN
  Administered 2023-05-06: 10 mg via ORAL
  Administered 2023-05-07: 5 mg via ORAL
  Filled 2023-05-06: qty 2
  Filled 2023-05-06: qty 1

## 2023-05-06 MED ORDER — FENTANYL CITRATE (PF) 100 MCG/2ML IJ SOLN
INTRAMUSCULAR | Status: AC
  Administered 2023-05-06: 50 ug
  Filled 2023-05-06: qty 2

## 2023-05-06 MED ORDER — SUCCINYLCHOLINE CHLORIDE 200 MG/10ML IV SOSY
PREFILLED_SYRINGE | INTRAVENOUS | Status: AC
  Filled 2023-05-06: qty 10

## 2023-05-06 SURGICAL SUPPLY — 57 items
ADH SKN CLS APL DERMABOND .7 (GAUZE/BANDAGES/DRESSINGS) ×1
APL PRP STRL LF DISP 70% ISPRP (MISCELLANEOUS) ×1
APPLIER CLIP 9.375 MED OPEN (MISCELLANEOUS) ×1
APR CLP MED 9.3 20 MLT OPN (MISCELLANEOUS) ×1
BAG COUNTER SPONGE SURGICOUNT (BAG) ×1 IMPLANT
BAG SPNG CNTER NS LX DISP (BAG) ×1
BINDER BREAST LRG (GAUZE/BANDAGES/DRESSINGS) IMPLANT
BINDER BREAST XLRG (GAUZE/BANDAGES/DRESSINGS) IMPLANT
BIOPATCH RED 1 DISK 7.0 (GAUZE/BANDAGES/DRESSINGS) ×1 IMPLANT
CANISTER SUCT 3000ML PPV (MISCELLANEOUS) ×1 IMPLANT
CHLORAPREP W/TINT 26 (MISCELLANEOUS) ×1 IMPLANT
CLIP APPLIE 9.375 MED OPEN (MISCELLANEOUS) ×1 IMPLANT
CNTNR URN SCR LID CUP LEK RST (MISCELLANEOUS) ×1 IMPLANT
CONT SPEC 4OZ STRL OR WHT (MISCELLANEOUS) ×1
COVER PROBE W GEL 5X96 (DRAPES) ×1 IMPLANT
COVER SURGICAL LIGHT HANDLE (MISCELLANEOUS) ×1 IMPLANT
DERMABOND ADVANCED .7 DNX12 (GAUZE/BANDAGES/DRESSINGS) ×1 IMPLANT
DEVICE DSSCT PLSMBLD 3.0S LGHT (MISCELLANEOUS) ×1 IMPLANT
DRAIN CHANNEL 19F RND (DRAIN) ×1 IMPLANT
DRAPE CHEST BREAST 15X10 FENES (DRAPES) ×1 IMPLANT
DRSG TEGADERM 4X4.75 (GAUZE/BANDAGES/DRESSINGS) ×1 IMPLANT
ELECT CAUTERY BLADE 6.4 (BLADE) ×1 IMPLANT
ELECT REM PT RETURN 9FT ADLT (ELECTROSURGICAL) ×1
ELECTRODE REM PT RTRN 9FT ADLT (ELECTROSURGICAL) ×1 IMPLANT
EVACUATOR SILICONE 100CC (DRAIN) ×1 IMPLANT
FILTER IN LINE W/DETACHED HOSE (FILTER) ×1 IMPLANT
GAUZE PAD ABD 8X10 STRL (GAUZE/BANDAGES/DRESSINGS) ×1 IMPLANT
GLOVE BIO SURGEON STRL SZ7.5 (GLOVE) ×1 IMPLANT
GOWN STRL REUS W/ TWL LRG LVL3 (GOWN DISPOSABLE) ×2 IMPLANT
GOWN STRL REUS W/TWL LRG LVL3 (GOWN DISPOSABLE) ×2
KIT BASIN OR (CUSTOM PROCEDURE TRAY) ×1 IMPLANT
KIT TURNOVER KIT B (KITS) ×1 IMPLANT
LIGHT WAVEGUIDE WIDE FLAT (MISCELLANEOUS) IMPLANT
NDL 18GX1X1/2 (RX/OR ONLY) (NEEDLE) IMPLANT
NDL FILTER BLUNT 18X1 1/2 (NEEDLE) IMPLANT
NDL HYPO 25GX1X1/2 BEV (NEEDLE) IMPLANT
NEEDLE 18GX1X1/2 (RX/OR ONLY) (NEEDLE)
NEEDLE FILTER BLUNT 18X1 1/2 (NEEDLE)
NEEDLE HYPO 25GX1X1/2 BEV (NEEDLE)
NS IRRIG 1000ML POUR BTL (IV SOLUTION) ×1 IMPLANT
PACK GENERAL/GYN (CUSTOM PROCEDURE TRAY) ×1 IMPLANT
PAD ARMBOARD 7.5X6 YLW CONV (MISCELLANEOUS) ×1 IMPLANT
PENCIL SMOKE EVACUATOR (MISCELLANEOUS) ×1 IMPLANT
PLASMABLADE 3.0S W/LIGHT (MISCELLANEOUS) ×1
SPECIMEN JAR X LARGE (MISCELLANEOUS) ×1 IMPLANT
SUT ETHILON 3 0 FSL (SUTURE) ×1 IMPLANT
SUT MNCRL AB 4-0 PS2 18 (SUTURE) ×1 IMPLANT
SUT VIC AB 0 CT1 27 (SUTURE) ×2
SUT VIC AB 0 CT1 27XBRD ANBCTR (SUTURE) ×2 IMPLANT
SUT VIC AB 3-0 54X BRD REEL (SUTURE) IMPLANT
SUT VIC AB 3-0 BRD 54 (SUTURE)
SUT VIC AB 3-0 SH 18 (SUTURE) ×1 IMPLANT
SUT VIC AB 3-0 SH 8-18 (SUTURE) IMPLANT
SYR CONTROL 10ML LL (SYRINGE) IMPLANT
TOWEL GREEN STERILE (TOWEL DISPOSABLE) ×1 IMPLANT
TOWEL GREEN STERILE FF (TOWEL DISPOSABLE) ×1 IMPLANT
TUBE CONNECTING 12X1/4 (SUCTIONS) IMPLANT

## 2023-05-06 NOTE — Anesthesia Postprocedure Evaluation (Signed)
Anesthesia Post Note  Patient: Melanie Welch  Procedure(s) Performed: LEFT MASTECTOMY AND SENTINEL NODE BIOPSY (Left) TARGETED NODE DISSECTION (Left)     Patient location during evaluation: PACU Anesthesia Type: Regional and General Level of consciousness: awake Pain management: pain level controlled Vital Signs Assessment: post-procedure vital signs reviewed and stable Respiratory status: spontaneous breathing, nonlabored ventilation and respiratory function stable Cardiovascular status: blood pressure returned to baseline and stable Postop Assessment: no apparent nausea or vomiting Anesthetic complications: no   There were no known notable events for this encounter.  Last Vitals:  Vitals:   05/06/23 1545 05/06/23 1600  BP: (!) 153/82 (!) 140/77  Pulse: 75 72  Resp: 12 11  Temp:  36.6 C  SpO2: 97% 97%    Last Pain:  Vitals:   05/06/23 1600  TempSrc:   PainSc: Asleep                 Salley Boxley P Nasiya Pascual

## 2023-05-06 NOTE — Interval H&P Note (Signed)
History and Physical Interval Note:  05/06/2023 9:55 AM  Melanie Welch  has presented today for surgery, with the diagnosis of LEFT BREAST CANCER.  The various methods of treatment have been discussed with the patient and family. After consideration of risks, benefits and other options for treatment, the patient has consented to  Procedure(s): LEFT MASTECTOMY AND SENTINEL NODE BIOPSY (Left) TARGETED NODE DISSECTION (Left) as a surgical intervention.  The patient's history has been reviewed, patient examined, no change in status, stable for surgery.  I have reviewed the patient's chart and labs.  Questions were answered to the patient's satisfaction.     [name]

## 2023-05-06 NOTE — Anesthesia Procedure Notes (Signed)
Procedure Name: Intubation Date/Time: 05/06/2023 11:58 AM  Performed by: Allyn Kenner, CRNAPre-anesthesia Checklist: Patient identified, Emergency Drugs available, Suction available and Patient being monitored Patient Re-evaluated:Patient Re-evaluated prior to induction Oxygen Delivery Method: Circle System Utilized Preoxygenation: Pre-oxygenation with 100% oxygen Induction Type: IV induction Ventilation: Mask ventilation without difficulty Tube type: Oral Tube size: 7.0 mm Number of attempts: 1 Airway Equipment and Method: Stylet and Oral airway Placement Confirmation: ETT inserted through vocal cords under direct vision, positive ETCO2 and breath sounds checked- equal and bilateral Tube secured with: Tape Dental Injury: Teeth and Oropharynx as per pre-operative assessment

## 2023-05-06 NOTE — Anesthesia Procedure Notes (Signed)
Anesthesia Regional Block: Pectoralis block   Pre-Anesthetic Checklist: , timeout performed,  Correct Patient, Correct Site, Correct Laterality,  Correct Procedure, Correct Position, site marked,  Risks and benefits discussed,  Surgical consent,  Pre-op evaluation,  At surgeon's request and post-op pain management  Laterality: Left  Prep: chloraprep       Needles:  Injection technique: Single-shot  Needle Type: Echogenic Stimulator Needle     Needle Length: 10cm  Needle Gauge: 20     Additional Needles:   Procedures:,,,, ultrasound used (permanent image in chart),,    Narrative:  Start time: 05/06/2023 11:10 AM End time: 05/06/2023 11:20 AM Injection made incrementally with aspirations every 5 mL.  Performed by: Personally  Anesthesiologist: Leonides Grills, MD  Additional Notes: Functioning IV was confirmed and monitors were applied.  A timeout was performed. Sterile prep, hand hygiene and sterile gloves were used. A 20ga Bbraun echogenic stimulator needle was used. Negative aspiration and negative test dose prior to incremental administration of local anesthetic. The patient tolerated the procedure well.  Ultrasound guidance: relevent anatomy identified, needle position confirmed, local anesthetic spread visualized around nerve(s), vascular puncture avoided.  Image printed for medical record.

## 2023-05-06 NOTE — Op Note (Signed)
05/06/2023  1:54 PM  PATIENT:  Melanie Welch  77 y.o. female  PRE-OPERATIVE DIAGNOSIS:  LEFT BREAST CANCER  POST-OPERATIVE DIAGNOSIS:  LEFT BREAST CANCER  PROCEDURE:  Procedure(s): LEFT MASTECTOMY AND DEEP LEFT AXILLARY SENTINEL NODE BIOPSY (Left) TARGETED NODE DISSECTION (Left)  SURGEON:  Surgeons and Role:    * Griselda Miner, MD - Primary  PHYSICIAN ASSISTANT:   ASSISTANTS: none   ANESTHESIA:   general  EBL:  minimal   BLOOD ADMINISTERED:none  DRAINS: (1) Blake drain(s) in the prepectoral space    LOCAL MEDICATIONS USED:  NONE  SPECIMEN:  Source of Specimen:  left mastectomy, sentinel node x 2, targeted node  DISPOSITION OF SPECIMEN:  PATHOLOGY  COUNTS:  YES  TOURNIQUET:  * No tourniquets in log *  DICTATION: .Dragon Dictation  After informed consent was obtained the patient was brought to the operating room and placed in the supine position on the operating table.  After adequate induction of general anesthesia the patient's left chest, breast, and axillary area were prepped with ChloraPrep, allowed to dry, and draped in usual sterile manner.  An appropriate timeout was performed.  Previously an I-125 seed was placed in a previously positive lymph node in the left axilla.  Also earlier in the day the patient underwent injection of 1 mCi of technetium sulfur colloid in the subareolar position on the left.  Attention was first turned to the left breast.  An elliptical incision was made widely around the nipple and areola complex in order to minimize the excess skin.  The incision was carried through the skin and subcutaneous tissue sharply with the PlasmaBlade.  Breast hooks were then used to elevate the skin flaps anteriorly towards the ceiling.  Thin skin flaps were created by dissecting between the breast tissue and the subcutaneous fat and skin.  This dissection was carried circumferentially all the way to the chest wall.  Next the breast was removed from the pectoralis  muscle with the pectoralis fascia.  Several small perforating vessels medially were controlled with 3-0 Vicryl figure-of-eight stitches.  Once this dissection was complete then the entire left breast was removed from the patient.  It was marked with a stitch on the lateral skin and sent to pathology for further evaluation.  The neoprobe was first set to I-125 and the node with the radioactive seed was readily identified.  This was excised sharply with the PlasmaBlade and the surrounding small vessels and lymphatics were controlled with clips.  This was sent as the targeted node and a specimen radiograph confirmed the presence of the seed.  The neoprobe was then set to 2 technetium and I was able to identify 2 other areas that had some radioactive signal.  These 2 areas were excised sharply with the PlasmaBlade and the surrounding small vessels and lymphatics were controlled with clips.  Ex vivo counts on these nodes were approximately 100.  No other hot or palpable nodes were identified in the left axilla.  At this point we were looking at the left axillary vein so most of the axillary contents were exposed.  Hemostasis was achieved using the PlasmaBlade.  The wound was irrigated with copious amounts of saline.  A small stab incision was then made near the anterior axillary line inferior to the operative bed.  A hemostat was placed through this opening and used to bring a 19 [place] round Blake drain into the operative bed.  The drain was curled along the chest wall.  The drain  was anchored to the skin with a 3-0 nylon stitch.  Next the superior and inferior skin flaps were grossly reapproximated with interrupted 3-0 Vicryl stitches.  The skin incision was then closed with a running 4-0 Monocryl subcuticular stitch.  Dermabond dressings and sterile drain dressings were applied.  The drain was placed to bulb suction and there was a good seal.  The patient tolerated the procedure well.  At the end of the case all  needle sponge and instrument counts were correct.  The patient was then awakened and taken to recovery in stable condition.  PLAN OF CARE: Admit for overnight observation  PATIENT DISPOSITION:  PACU - hemodynamically stable.   Delay start of Pharmacological VTE agent (>24hrs) due to surgical blood loss or risk of bleeding: no

## 2023-05-06 NOTE — H&P (Signed)
REFERRING PHYSICIAN: Corrie Mckusick, MD PROVIDER: Lindell Noe, MD MRN: [id number] DOB: 05-Nov-1945 Subjective   Chief Complaint: Breast Cancer  History of Present Illness: Melanie Welch is a 77 y.o. female who is seen today as an office consultation for evaluation of Breast Cancer  We are asked to see the patient in consultation by Dr. Assunta Found to evaluate her for a new left breast cancer. The patient is a 77 year old black female who recently felt a mass in the left axilla. She went to her doctor and was evaluated with mammogram and ultrasound. She was found to have a 5 mm mass in the upper outer quadrant of the left breast as well as a separate area of distortion. She also had 1 and possibly 2 abnormal looking lymph nodes in the left axilla. 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 She does have a family history of pancreatic cancer in her mother and multiple myeloma and a sister. She does not smoke.  Review of Systems: A complete review of systems was obtained from the patient. I have reviewed this information and discussed as appropriate with the patient. See HPI as well for other ROS.  ROS   Medical History: Past Medical History:  Diagnosis Date  Chronic kidney disease  Diabetes mellitus without complication (CMS/HHS-HCC)  History of cancer  Liver disease  Thyroid disease   There is no problem list on file for this patient.  Past Surgical History:  Procedure Laterality Date  HYSTERECTOMY    Allergies  Allergen Reactions  Atorvastatin Vomiting  VOMITING   Current Outpatient Medications on File Prior to Visit  Medication Sig Dispense Refill  losartan (COZAAR) 100 MG tablet Take by mouth  metFORMIN (GLUCOPHAGE) 1000 MG tablet Take 1,000 mg by mouth 2 (two) times daily  amLODIPine (NORVASC) 10 MG tablet Take by mouth  ascorbic acid, vitamin C, (VITAMIN C) 250 MG tablet Take by  mouth  cholecalciferol (VITAMIN D3) 400 unit tablet Take by mouth  coenzyme Q10 10 mg capsule Take by mouth  multivitamin (MULTIVITAMIN) tablet Take by mouth  omega-3 fatty acids-fish oil 300-1,000 mg capsule Take by mouth   No current facility-administered medications on file prior to visit.   Family History  Problem Relation Age of Onset  Diabetes Sister    Social History   Tobacco Use  Smoking Status Former  Smokeless Tobacco Never    Social History   Socioeconomic History  Marital status: Single  Tobacco Use  Smoking status: Former  Smokeless tobacco: Never   Social Determinants of Corporate investment banker Strain: Low Risk (04/29/2021)  Received from [facility]  Overall Financial Resource Strain (CARDIA)  Difficulty of Paying Living Expenses: Not hard at all  Food Insecurity: No Food Insecurity (04/29/2021)  Received from [facility]  Hunger Vital Sign  Worried About Running Out of Food in the Last Year: Never true  Ran Out of Food in the Last Year: Never true  Transportation Needs: No Transportation Needs (04/29/2021)  Received from [facility]  PRAPARE - Transportation  Lack of Transportation (Medical): No  Lack of Transportation (Non-Medical): No  Physical Activity: Inactive (09/23/2021)  Received from [facility]  Exercise Vital Sign  Days of Exercise per Week: 0 days  Minutes of Exercise per Session: 0 min  Stress: No Stress Concern Present (04/29/2021)  Received from [facility]  [employer] of Occupational Health - Occupational Stress Questionnaire  Feeling of Stress : Not at all  Received from [employer]  Social Network   Objective:   Vitals:   PainSc: 0-No pain   There is no height or weight on file to calculate BMI.  Physical Exam Vitals reviewed.  Constitutional:  General: She is not in acute distress. Appearance: Normal appearance.  HENT:  Head: Normocephalic and atraumatic.  Right Ear: External ear  normal.  Left Ear: External ear normal.  Nose: Nose normal.  Mouth/Throat:  Mouth: Mucous membranes are moist.  Pharynx: Oropharynx is clear.  Eyes:  General: No scleral icterus. Extraocular Movements: Extraocular movements intact.  Conjunctiva/sclera: Conjunctivae normal.  Pupils: Pupils are equal, round, and reactive to light.  Cardiovascular:  Rate and Rhythm: Normal rate and regular rhythm.  Pulses: Normal pulses.  Heart sounds: Normal heart sounds.  Pulmonary:  Effort: Pulmonary effort is normal. No respiratory distress.  Breath sounds: Normal breath sounds.  Abdominal:  General: Bowel sounds are normal.  Palpations: Abdomen is soft.  Tenderness: There is no abdominal tenderness.  Musculoskeletal:  General: No swelling, tenderness or deformity. Normal range of motion.  Cervical back: Normal range of motion and neck supple.  Skin: General: Skin is warm and dry.  Coloration: Skin is not jaundiced.  Neurological:  General: No focal deficit present.  Mental Status: She is alert and oriented to person, place, and time.  Psychiatric:  Mood and Affect: Mood normal.  Behavior: Behavior normal.     Breast: There is a large bruise in the upper outer quadrant of the left breast as well as in the left axilla. There is no palpable mass in the right breast. There is only the palpable bruise in the left axilla  Labs, Imaging and Diagnostic Testing:  Assessment and Plan:   Diagnoses and all orders for this visit:  Malignant neoplasm of upper-outer quadrant of left female breast, unspecified estrogen receptor status (CMS/HHS-HCC) - MRI breast bilateral with and without contrast; Future - Ambulatory Referral to Oncology-Medical - Ambulatory Referral to Radiation Oncology - Ambulatory Referral to Physical Therapy - [facility] Case Posting Request; Future   The patient appears to have 2 small areas of invasive lobular cancer in the upper outer quadrant of the left breast with a  positive node. At this point given the lobular nature of the cancer we will evaluate it further with MRI. I will make her referrals to medical and radiation oncology to discuss adjuvant versus neoadjuvant therapy. Once we have the results of the MRI then we will have a better idea of whether she would be a candidate for breast conservation versus mastectomy. We can then begin surgical planning. If she is a candidate for neoadjuvant therapy then it may shrink the lymph node. If she is not then we would likely plan for targeted node dissection with sentinel node mapping at the time of her definitive surgery and then use that information to make any final decisions on whether she would need a full lymph node dissection or not. I have discussed all of this with her in detail including risks and benefits of the surgery as well as some of the technical aspects and she understands. We will move forward with planning once we get the rest of the information.  Plan for left mastectomy and sentinel node biopsy with targeted node dissection

## 2023-05-06 NOTE — Transfer of Care (Signed)
Immediate Anesthesia Transfer of Care Note  Patient: Melanie Welch  Procedure(s) Performed: LEFT MASTECTOMY AND SENTINEL NODE BIOPSY (Left) TARGETED NODE DISSECTION (Left)  Patient Location: PACU  Anesthesia Type:GA combined with regional for post-op pain   Level of Consciousness: awake, alert , and oriented   Airway & Oxygen Therapy: Patient Spontanous Breathing and Patient connected to face mask oxygen  Post-op Assessment: Report given to RN and Post -op Vital signs reviewed and stable  Post vital signs: Reviewed and stable  Last Vitals:  Vitals Value Taken Time  BP 154/88 05/06/23 1415  Temp    Pulse 95 05/06/23 1416  Resp 15 05/06/23 1416  SpO2 100 % 05/06/23 1416  Vitals shown include unfiled device data.  Last Pain:  Vitals:   05/06/23 1048  TempSrc:   PainSc: 0-No pain         Complications: There were no known notable events for this encounter.

## 2023-05-06 NOTE — Anesthesia Preprocedure Evaluation (Addendum)
Anesthesia Evaluation  Patient identified by MRN, date of birth, ID band Patient awake    Reviewed: Allergy & Precautions, NPO status , Patient's Chart, lab work & pertinent test results  Airway Mallampati: II  TM Distance: >3 FB Neck ROM: Full    Dental no notable dental hx.    Pulmonary former smoker   Pulmonary exam normal        Cardiovascular hypertension, Pt. on medications Normal cardiovascular exam     Neuro/Psych negative neurological ROS  negative psych ROS   GI/Hepatic negative GI ROS, Neg liver ROS,,,  Endo/Other  diabetes, Oral Hypoglycemic Agents    Renal/GU Renal disease     Musculoskeletal negative musculoskeletal ROS (+)    Abdominal   Peds  Hematology negative hematology ROS (+)   Anesthesia Other Findings LEFT BREAST CANCER  Reproductive/Obstetrics                              Anesthesia Physical Anesthesia Plan  ASA: 3  Anesthesia Plan: General and Regional   Post-op Pain Management: Regional block*   Induction: Intravenous  PONV Risk Score and Plan: 3 and Ondansetron, Dexamethasone, Midazolam and Treatment may vary due to age or medical condition  Airway Management Planned: Oral ETT  Additional Equipment:   Intra-op Plan:   Post-operative Plan: Extubation in OR  Informed Consent: I have reviewed the patients History and Physical, chart, labs and discussed the procedure including the risks, benefits and alternatives for the proposed anesthesia with the patient or authorized representative who has indicated his/her understanding and acceptance.     Dental advisory given  Plan Discussed with: CRNA  Anesthesia Plan Comments:         Anesthesia Quick Evaluation

## 2023-05-07 ENCOUNTER — Encounter (HOSPITAL_COMMUNITY): Payer: Self-pay | Admitting: General Surgery

## 2023-05-07 DIAGNOSIS — Z170 Estrogen receptor positive status [ER+]: Secondary | ICD-10-CM | POA: Diagnosis not present

## 2023-05-07 DIAGNOSIS — C773 Secondary and unspecified malignant neoplasm of axilla and upper limb lymph nodes: Secondary | ICD-10-CM | POA: Diagnosis not present

## 2023-05-07 DIAGNOSIS — E079 Disorder of thyroid, unspecified: Secondary | ICD-10-CM | POA: Diagnosis not present

## 2023-05-07 DIAGNOSIS — E1122 Type 2 diabetes mellitus with diabetic chronic kidney disease: Secondary | ICD-10-CM | POA: Diagnosis not present

## 2023-05-07 DIAGNOSIS — Z800 Family history of malignant neoplasm of digestive organs: Secondary | ICD-10-CM | POA: Diagnosis not present

## 2023-05-07 DIAGNOSIS — N189 Chronic kidney disease, unspecified: Secondary | ICD-10-CM | POA: Diagnosis not present

## 2023-05-07 DIAGNOSIS — Z23 Encounter for immunization: Secondary | ICD-10-CM | POA: Diagnosis not present

## 2023-05-07 DIAGNOSIS — Z1721 Progesterone receptor positive status: Secondary | ICD-10-CM | POA: Diagnosis not present

## 2023-05-07 DIAGNOSIS — C50412 Malignant neoplasm of upper-outer quadrant of left female breast: Secondary | ICD-10-CM | POA: Diagnosis not present

## 2023-05-07 NOTE — Discharge Summary (Signed)
Physician Discharge Summary  Patient ID: Melanie Welch MRN: [id number] DOB/AGE: 77-Jan-1947 77 y.o.  Admit date: 05/06/2023 Discharge date: 05/07/2023  Admission Diagnoses: Left breast cancer  Discharge Diagnoses:  Principal Problem:   Cancer of left female breast [name]   Discharged Condition: good  Hospital Course: Patient underwent left simple mastectomy.  She did well postoperatively.  She was tolerating her diet, had good pain control and her drainage was appropriate in her JP.  C    Treatments: surgery: Left simple mastectomy  Discharge Exam: Blood pressure 120/67, pulse 73, temperature 98.3 F (36.8 C), temperature source Oral, resp. rate 14, height 5\' 8"  (1.727 m), weight 70.3 kg, SpO2 99%. General appearance: alert and cooperative Resp: clear to auscultation bilaterally Cardio: Normal sinus rhythm Incision/Wound: Left mastectomy wound clean dry intact.  JP drain in place.  Flaps are viable.  No hematoma.  Disposition:      Follow-up Information     [name], MD Follow up in 2 week(s).   Specialty: General Surgery Contact information: [address] [place] [address] [place] [address] [phone]                 Signed: Clovis Pu Maridee Slape 05/07/2023, 10:16 AM

## 2023-05-07 NOTE — Discharge Instructions (Addendum)
[facility] [phone]  MASTECTOMY: POST OP INSTRUCTIONS  Always review your discharge instruction sheet given to you by the facility where your surgery was performed. IF YOU HAVE DISABILITY OR FAMILY LEAVE FORMS, YOU MUST BRING THEM TO THE OFFICE FOR PROCESSING.   DO NOT GIVE THEM TO YOUR DOCTOR. A prescription for pain medication may be given to you upon discharge.  Take your pain medication as prescribed, if needed.  If narcotic pain medicine is not needed, then you may take acetaminophen (Tylenol) or ibuprofen (Advil) as needed. Take your usually prescribed medications unless otherwise directed. If you need a refill on your pain medication, please contact your pharmacy.  They will contact our office to request authorization.  Prescriptions will not be filled after 5pm or on week-ends. You should follow a light diet the first few days after arrival home, such as soup and crackers, etc.  Resume your normal diet the day after surgery. Most patients will experience some swelling and bruising on the chest and underarm.  Ice packs will help.  Swelling and bruising can take several days to resolve.  It is common to experience some constipation if taking pain medication after surgery.  Increasing fluid intake and taking a stool softener (such as Colace) will usually help or prevent this problem from occurring.  A mild laxative (Milk of Magnesia or Miralax) should be taken according to package instructions if there are no bowel movements after 48 hours. Unless discharge instructions indicate otherwise, leave your bandage dry and in place until your next appointment in 3-5 days.  You may take a limited sponge bath.  No tube baths or showers until the drains are removed.  You may have steri-strips (small skin tapes) in place directly over the incision.  These strips should be left on the skin for 7-10 days.  If your surgeon used skin glue on the incision, you may shower in 24 hours.   The glue will flake off over the next 2-3 weeks.  Any sutures or staples will be removed at the office during your follow-up visit. DRAINS:  If you have drains in place, it is important to keep a list of the amount of drainage produced each day in your drains.  Before leaving the hospital, you should be instructed on drain care.  Call our office if you have any questions about your drains. ACTIVITIES:  You may resume regular (light) daily activities beginning the next day--such as daily self-care, walking, climbing stairs--gradually increasing activities as tolerated.  You may have sexual intercourse when it is comfortable.  Refrain from any heavy lifting or straining until approved by your doctor. You may drive when you are no longer taking prescription pain medication, you can comfortably wear a seatbelt, and you can safely maneuver your car and apply brakes. RETURN TO WORK:  __________________________________________________________ Bonita Quin should see your doctor in the office for a follow-up appointment approximately 3-5 days after your surgery.  Your doctor's nurse will typically make your follow-up appointment when she calls you with your pathology report.  Expect your pathology report 2-3 business days after your surgery.  You may call to check if you do not hear from [place] after three days.   OTHER INSTRUCTIONS: ______________________________________________________________________________________________ ____________________________________________________________________________________________ WHEN TO CALL YOUR DOCTOR: Fever over 101.0 Nausea and/or vomiting Extreme swelling or bruising Continued bleeding from incision. Increased pain, redness, or drainage from the incision. The clinic staff is available to answer your questions during regular business hours.  Please don't hesitate  to call and ask to speak to one of the nurses for clinical concerns.  If you have a medical emergency, go to the  nearest emergency room or call 911.  A surgeon from [facility] is always on call at the hospital. [address], [place], [place]  [address] ? [address] [address], [place], [place]   [address] [phone] ? [phone] ? FAX [phone] Web site: [email or web address]      Second to [employer] for prosthetic tops  Located on [address] near the science center

## 2023-05-09 LAB — SURGICAL PATHOLOGY

## 2023-05-13 ENCOUNTER — Telehealth: Payer: Self-pay | Admitting: Hematology and Oncology

## 2023-05-13 NOTE — Telephone Encounter (Signed)
Left patient a message about scheduled follow up appointment with Gudena; left callback if needed for rescheduling

## 2023-05-16 ENCOUNTER — Encounter: Payer: Self-pay | Admitting: *Deleted

## 2023-05-25 ENCOUNTER — Telehealth: Payer: Self-pay | Admitting: Hematology and Oncology

## 2023-05-25 NOTE — Telephone Encounter (Signed)
Left patient a message in regards to scheduled follow up appointment times/dates

## 2023-05-26 ENCOUNTER — Ambulatory Visit: Payer: Self-pay | Admitting: General Surgery

## 2023-05-26 MED ORDER — KETOROLAC TROMETHAMINE 15 MG/ML IJ SOLN: 15.0000 mg | INTRAMUSCULAR | Status: AC

## 2023-06-03 ENCOUNTER — Encounter (HOSPITAL_BASED_OUTPATIENT_CLINIC_OR_DEPARTMENT_OTHER): Payer: Self-pay | Admitting: General Surgery

## 2023-06-03 ENCOUNTER — Other Ambulatory Visit: Payer: Self-pay

## 2023-06-06 ENCOUNTER — Encounter (HOSPITAL_BASED_OUTPATIENT_CLINIC_OR_DEPARTMENT_OTHER)
Admission: RE | Admit: 2023-06-06 | Discharge: 2023-06-06 | Disposition: A | Discharge status: Home / Self Care | Payer: Medicare HMO | Source: Ambulatory Visit | Attending: General Surgery

## 2023-06-06 ENCOUNTER — Inpatient Hospital Stay: Payer: Medicare HMO | Attending: Hematology and Oncology | Admitting: Hematology and Oncology

## 2023-06-06 ENCOUNTER — Ambulatory Visit: Payer: Medicare HMO | Admitting: Hematology and Oncology

## 2023-06-06 ENCOUNTER — Encounter: Payer: Self-pay | Admitting: *Deleted

## 2023-06-06 ENCOUNTER — Encounter: Payer: Self-pay | Admitting: Hematology and Oncology

## 2023-06-06 VITALS — BP 140/76 | HR 83 | Temp 97.5°F | Resp 18 | Ht 68.0 in | Wt 148.6 lb

## 2023-06-06 DIAGNOSIS — E119 Type 2 diabetes mellitus without complications: Secondary | ICD-10-CM | POA: Insufficient documentation

## 2023-06-06 DIAGNOSIS — Z5189 Encounter for other specified aftercare: Secondary | ICD-10-CM | POA: Insufficient documentation

## 2023-06-06 DIAGNOSIS — C50412 Malignant neoplasm of upper-outer quadrant of left female breast: Secondary | ICD-10-CM | POA: Insufficient documentation

## 2023-06-06 DIAGNOSIS — Z9012 Acquired absence of left breast and nipple: Secondary | ICD-10-CM | POA: Diagnosis not present

## 2023-06-06 DIAGNOSIS — Z170 Estrogen receptor positive status [ER+]: Secondary | ICD-10-CM | POA: Diagnosis not present

## 2023-06-06 DIAGNOSIS — Z01812 Encounter for preprocedural laboratory examination: Secondary | ICD-10-CM | POA: Insufficient documentation

## 2023-06-06 DIAGNOSIS — Z5111 Encounter for antineoplastic chemotherapy: Secondary | ICD-10-CM | POA: Insufficient documentation

## 2023-06-06 LAB — BASIC METABOLIC PANEL
Anion gap: 15 (ref 5–15)
BUN: 15 mg/dL (ref 8–23)
CO2: 22 mmol/L (ref 22–32)
Calcium: 10.2 mg/dL (ref 8.9–10.3)
Chloride: 104 mmol/L (ref 98–111)
Creatinine, Ser: 1.19 mg/dL — ABNORMAL HIGH (ref 0.44–1.00)
GFR, Estimated: 47 mL/min — ABNORMAL LOW (ref >60)
Glucose, Bld: 117 mg/dL — ABNORMAL HIGH (ref 70–99)
Potassium: 4.1 mmol/L (ref 3.5–5.1)
Sodium: 141 mmol/L (ref 135–145)

## 2023-06-06 MED ORDER — PROCHLORPERAZINE MALEATE 10 MG PO TABS: 10.0000 mg | ORAL_TABLET | Freq: Four times a day (QID) | ORAL | 1 refills | Status: DC | PRN

## 2023-06-06 MED ORDER — CHLORHEXIDINE GLUCONATE CLOTH 2 % EX PADS: 6.0000 | MEDICATED_PAD | Freq: Once | CUTANEOUS | Status: DC

## 2023-06-06 MED ORDER — LIDOCAINE-PRILOCAINE 2.5-2.5 % EX CREA: TOPICAL_CREAM | CUTANEOUS | 3 refills | Status: DC

## 2023-06-06 MED ORDER — ONDANSETRON HCL 8 MG PO TABS: 8.0000 mg | ORAL_TABLET | Freq: Three times a day (TID) | ORAL | 1 refills | Status: DC | PRN

## 2023-06-06 MED ORDER — DEXAMETHASONE 4 MG PO TABS: ORAL_TABLET | ORAL | 0 refills | Status: DC

## 2023-06-06 NOTE — Progress Notes (Signed)
START ON PATHWAY REGIMEN - Breast     A cycle is every 21 days:     Cyclophosphamide      Docetaxel   **Always confirm dose/schedule in your pharmacy ordering system**  Patient Characteristics: Postoperative without Neoadjuvant Therapy, M0 (Pathologic Staging), Invasive Disease, Adjuvant Therapy, HER2 Negative, ER Positive, Node Positive, Node Positive (1-3), Genomic Testing Not Performed, Chemotherapy Candidate Therapeutic Status: Postoperative without Neoadjuvant Therapy, M0 (Pathologic Staging) AJCC Grade: G3 AJCC N Category: pN1 AJCC M Category: cM0 ER Status: Positive (+) AJCC 8 Stage Grouping: IIB HER2 Status: Negative (-) Oncotype Dx Recurrence Score: Not Appropriate AJCC T Category: pT3 PR Status: Positive (+) Has this patient completed genomic testing<= No - Did Not Order Test  Intent of Therapy: Curative Intent, Discussed with Patient

## 2023-06-06 NOTE — Progress Notes (Signed)

## 2023-06-06 NOTE — Assessment & Plan Note (Addendum)
Palpable concern left axilla, mammogram showed 0.5 cm mass left breast 1:30 position possibly an intramammary lymph node, at the site of palpable concern there was an abnormal lymph node.  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, left axillary lymph node biopsy positive, no evidence of extranodal extension, ER 100%, PR 99%, Ki-67 50%, HER2 1+ negative, stage IIa   05/06/2023: Left mastectomy: Grade 3 ILC with LCIS, 5 cm, margins negative, lymphovascular base involvement present, 3/4 lymph nodes positive, ER 100%, PR 99%, HER2 negative 1+, Ki-67 50%  Pathology counseling: I discussed the final pathology report of the patient provided  a copy of this report. I discussed the margins as well as lymph node surgeries. We also discussed the final staging along with previously performed ER/PR and HER-2/neu testing.  I am concerned that the patient has a grade 3 tumor with 3 positive lymph nodes and a high Ki-67  Treatment plan: Oncotype DX testing to determine if chemotherapy would be of any benefit followed by Adjuvant radiation therapy followed by Adjuvant antiestrogen therapy  Return to clinic based upon Oncotype DX test result

## 2023-06-06 NOTE — Progress Notes (Signed)
Patient Care Team: Assunta Found, MD as PCP - General (Family Medicine) Lanelle Bal, DO as Consulting Physician (Internal Medicine)  DIAGNOSIS:  Encounter Diagnosis  Name Primary?   Malignant neoplasm of upper-outer quadrant of left breast in female, estrogen receptor positive (HCC) Yes    SUMMARY OF ONCOLOGIC HISTORY: Oncology History  Malignant neoplasm of upper-outer quadrant of left breast in female, estrogen receptor positive (HCC)  03/30/2023 Initial Diagnosis   Palpable concern left axilla, mammogram showed 0.5 cm mass left breast 1:30 position possibly an intramammary lymph node, at the site of palpable concern there was an abnormal lymph node.  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 The recommendation was for a discussion about ALND.  Dr. Pamelia Hoit plans to discuss adj chemo with her also. He is seeing her 11/4.  Looks like she is scheduled for post op appt with Dr. Carolynne Edouard 10/24.    06/06/2023 Cancer Staging   Staging form: Breast, AJCC 8th Edition - Pathologic: Stage IIA (pT2, pN1(sn), cM0, G3, ER+, PR+, HER2-) - Signed by Serena Croissant, MD on 06/06/2023 Stage prefix: Initial diagnosis Method of lymph node assessment: Sentinel lymph node biopsy Histologic grading system: 3 grade system   06/20/2023 -  Chemotherapy   Patient is on Treatment Plan : BREAST TC q21d       CHIEF COMPLIANT: Follow-up after surgery to discuss treatment plan  HISTORY OF PRESENT ILLNESS:  History of Present Illness   The patient, with a history  of left breast cancer, presents for a post-operative follow-up after a left mastectomy. The pathology report revealed a 5 cm tumor, with negative margins indicating complete removal of the tumor. However, of the four lymph nodes removed, three were positive for breast cancer, with evidence of extranodal extension, indicating an aggressive disease. The patient is otherwise fit and healthy, maintaining a healthy lifestyle with regular exercise and a diet rich in raw foods and juicing. She has been juicing for two years, which includes a mix of ginger and lemon, known for their anti-nausea properties. The patient also reports a recent weight gain of four pounds.         ALLERGIES:  is allergic to lipitor [atorvastatin].  MEDICATIONS:  Current Outpatient Medications  Medication Sig Dispense Refill   amLODipine (NORVASC) 10 MG tablet Take 10 mg by mouth at bedtime.      ascorbic acid (VITAMIN C) 250 MG tablet Take 250 mg by mouth daily.     Cholecalciferol (VITAMIN D3 PO) Take 1 tablet by mouth daily.     dexamethasone (DECADRON) 4 MG tablet Take 1 tablet day before chemo and 1 tablet day after chemo with food 8 tablet 0   lidocaine-prilocaine (EMLA) cream Apply to affected area once 30 g 3   losartan (COZAAR) 100 MG tablet Take 100 mg by mouth daily.     metFORMIN (GLUCOPHAGE) 1000 MG tablet Take 1,000 mg by mouth 2 (two) times daily with a meal.  0   methocarbamol (ROBAXIN) 500 MG tablet Take 1 tablet (500 mg total) by mouth  every 6 (six) hours as needed for muscle spasms. 30 tablet 2   Omega-3 Fatty Acids (FISH OIL) 1000 MG CAPS Take 1,000 mg by mouth daily.     ondansetron (ZOFRAN) 8 MG tablet Take 1 tablet (8 mg total) by mouth every 8 (eight) hours as needed for nausea or vomiting. Start on the third day after chemotherapy. 30 tablet 1   oxyCODONE (ROXICODONE) 5 MG immediate release tablet Take 1 tablet (5 mg total) by mouth every 6 (six) hours as needed for severe pain. 15 tablet 0    prochlorperazine (COMPAZINE) 10 MG tablet Take 1 tablet (10 mg total) by mouth every 6 (six) hours as needed for nausea or vomiting. 30 tablet 1   No current facility-administered medications for this visit.    PHYSICAL EXAMINATION: ECOG PERFORMANCE STATUS: 1 - Symptomatic but completely ambulatory  Vitals:   06/06/23 1219  BP: (!) 140/76  Pulse: 83  Resp: 18  Temp: (!) 97.5 F (36.4 C)  SpO2: 100%   Filed Weights   06/06/23 1219  Weight: 148 lb 9.6 oz (67.4 kg)    Physical Exam   MEASUREMENTS: WT- 148      (exam performed in the presence of a chaperone)  LABORATORY DATA:  I have reviewed the data as listed    Latest Ref Rng & Units 05/04/2023   10:08 AM 07/14/2022   12:05 PM 09/03/2019   12:49 PM  CMP  Glucose 70 - 99 mg/dL 244  010  272   BUN 8 - 23 mg/dL 11  13  15    Creatinine 0.44 - 1.00 mg/dL 5.36  6.44  0.34   Sodium 135 - 145 mmol/L 138  139  138   Potassium 3.5 - 5.1 mmol/L 4.1  3.8  3.6   Chloride 98 - 111 mmol/L 103  101  103   CO2 22 - 32 mmol/L 27  26  26    Calcium 8.9 - 10.3 mg/dL 9.5  9.3  9.4   Total Protein 6.0 - 8.5 g/dL  7.3    Total Bilirubin 0.0 - 1.2 mg/dL  0.3    Alkaline Phos 44 - 121 IU/L  41    AST 0 - 40 IU/L  17    ALT 0 - 32 IU/L  13      Lab Results  Component Value Date   WBC 5.4 05/04/2023   HGB 10.8 (L) 05/04/2023   HCT 34.5 (L) 05/04/2023   MCV 85.4 05/04/2023   PLT 220 05/04/2023   NEUTROABS 1.8 03/16/2019    ASSESSMENT & PLAN:  Malignant neoplasm of upper-outer quadrant of left breast in female, estrogen receptor positive (HCC) Palpable concern left axilla, mammogram showed 0.5 cm mass left breast 1:30 position possibly an intramammary lymph node, at the site of palpable concern there was an abnormal lymph node.  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 I discussed the margins as well as lymph node surgeries. We also discussed the final staging along with previously performed ER/PR and HER-2/neu testing.  I am concerned  that the patient has a grade 3 tumor with 3 positive lymph nodes and a high Ki-67  Treatment plan: Based upon tumor size greater than 5 cm and presence of 3 positive lymph nodes being grade 3 and Ki-67 of 50% I recommended systemic chemotherapy with Taxotere and Cytoxan every 3 weeks x 4 Adjuvant radiation therapy followed by Adjuvant antiestrogen therapy   Orders Placed This Encounter  Procedures   Consent Attestation for Oncology Treatment    Order Specific Question:   The patient is informed of risks, benefits, side-effects of the prescribed oncology treatment. Potential short term and long term side effects and response rates discussed. After a long discussion, the patient made informed decision to proceed.    Answer:   Yes   The patient has a good understanding of the overall plan. she agrees with it. she will call with any problems that may develop before the next visit here. Total time spent: 30 mins including face to face time and time spent for planning, charting and co-ordination of care   Tamsen Meek, MD 06/06/23

## 2023-06-07 ENCOUNTER — Ambulatory Visit: Payer: Self-pay | Admitting: General Surgery

## 2023-06-07 ENCOUNTER — Other Ambulatory Visit: Payer: Self-pay

## 2023-06-10 ENCOUNTER — Ambulatory Visit (HOSPITAL_BASED_OUTPATIENT_CLINIC_OR_DEPARTMENT_OTHER): Payer: Medicare HMO | Admitting: Anesthesiology

## 2023-06-10 ENCOUNTER — Encounter (HOSPITAL_BASED_OUTPATIENT_CLINIC_OR_DEPARTMENT_OTHER): Payer: Self-pay | Admitting: General Surgery

## 2023-06-10 ENCOUNTER — Other Ambulatory Visit: Payer: Self-pay | Admitting: *Deleted

## 2023-06-10 ENCOUNTER — Ambulatory Visit (HOSPITAL_COMMUNITY): Payer: Medicare HMO

## 2023-06-10 ENCOUNTER — Ambulatory Visit (HOSPITAL_BASED_OUTPATIENT_CLINIC_OR_DEPARTMENT_OTHER)
Admission: RE | Admit: 2023-06-10 | Discharge: 2023-06-11 | Disposition: A | Discharge status: Home / Self Care | Payer: Medicare HMO | Attending: General Surgery | Admitting: General Surgery

## 2023-06-10 ENCOUNTER — Encounter (HOSPITAL_BASED_OUTPATIENT_CLINIC_OR_DEPARTMENT_OTHER)
Admission: RE | Disposition: A | Discharge status: Home / Self Care | Payer: Self-pay | Source: Home / Self Care | Attending: General Surgery

## 2023-06-10 ENCOUNTER — Other Ambulatory Visit: Payer: Self-pay

## 2023-06-10 DIAGNOSIS — Z87891 Personal history of nicotine dependence: Secondary | ICD-10-CM | POA: Diagnosis not present

## 2023-06-10 DIAGNOSIS — Z79899 Other long term (current) drug therapy: Secondary | ICD-10-CM | POA: Insufficient documentation

## 2023-06-10 DIAGNOSIS — N189 Chronic kidney disease, unspecified: Secondary | ICD-10-CM | POA: Insufficient documentation

## 2023-06-10 DIAGNOSIS — Z1732 Human epidermal growth factor receptor 2 negative status: Secondary | ICD-10-CM | POA: Insufficient documentation

## 2023-06-10 DIAGNOSIS — I129 Hypertensive chronic kidney disease with stage 1 through stage 4 chronic kidney disease, or unspecified chronic kidney disease: Secondary | ICD-10-CM | POA: Insufficient documentation

## 2023-06-10 DIAGNOSIS — Z7984 Long term (current) use of oral hypoglycemic drugs: Secondary | ICD-10-CM | POA: Insufficient documentation

## 2023-06-10 DIAGNOSIS — C50912 Malignant neoplasm of unspecified site of left female breast: Secondary | ICD-10-CM

## 2023-06-10 DIAGNOSIS — Z1721 Progesterone receptor positive status: Secondary | ICD-10-CM | POA: Diagnosis not present

## 2023-06-10 DIAGNOSIS — C50412 Malignant neoplasm of upper-outer quadrant of left female breast: Secondary | ICD-10-CM | POA: Diagnosis not present

## 2023-06-10 DIAGNOSIS — I96 Gangrene, not elsewhere classified: Secondary | ICD-10-CM | POA: Diagnosis not present

## 2023-06-10 DIAGNOSIS — Z452 Encounter for adjustment and management of vascular access device: Secondary | ICD-10-CM | POA: Diagnosis not present

## 2023-06-10 DIAGNOSIS — E119 Type 2 diabetes mellitus without complications: Secondary | ICD-10-CM | POA: Diagnosis not present

## 2023-06-10 DIAGNOSIS — I10 Essential (primary) hypertension: Secondary | ICD-10-CM | POA: Diagnosis not present

## 2023-06-10 DIAGNOSIS — E1122 Type 2 diabetes mellitus with diabetic chronic kidney disease: Secondary | ICD-10-CM | POA: Diagnosis not present

## 2023-06-10 DIAGNOSIS — Z170 Estrogen receptor positive status [ER+]: Secondary | ICD-10-CM

## 2023-06-10 DIAGNOSIS — I898 Other specified noninfective disorders of lymphatic vessels and lymph nodes: Secondary | ICD-10-CM | POA: Diagnosis not present

## 2023-06-10 HISTORY — PX: AXILLARY LYMPH NODE DISSECTION: SHX5229

## 2023-06-10 HISTORY — PX: PORTACATH PLACEMENT: SHX2246

## 2023-06-10 LAB — GLUCOSE, CAPILLARY
Glucose-Capillary: 102 mg/dL — ABNORMAL HIGH (ref 70–99)
Glucose-Capillary: 94 mg/dL (ref 70–99)

## 2023-06-10 SURGERY — LYMPHADENECTOMY, AXILLARY
Anesthesia: General | Site: Chest

## 2023-06-10 MED ORDER — FENTANYL CITRATE (PF) 100 MCG/2ML IJ SOLN: 25.0000 ug | INTRAMUSCULAR | Status: DC | PRN

## 2023-06-10 MED ORDER — CHLORHEXIDINE GLUCONATE CLOTH 2 % EX PADS: 6.0000 | MEDICATED_PAD | Freq: Once | CUTANEOUS | Status: DC

## 2023-06-10 MED ORDER — MORPHINE SULFATE (PF) 4 MG/ML IV SOLN
1.0000 mg | INTRAVENOUS | Status: DC | PRN
  Administered 2023-06-10: 1 mg via INTRAVENOUS
  Filled 2023-06-10: qty 1

## 2023-06-10 MED ORDER — FENTANYL CITRATE (PF) 100 MCG/2ML IJ SOLN
INTRAMUSCULAR | Status: AC
  Filled 2023-06-10: qty 2

## 2023-06-10 MED ORDER — GABAPENTIN 100 MG PO CAPS: 100.0000 mg | ORAL_CAPSULE | ORAL | Status: AC

## 2023-06-10 MED ORDER — PHENYLEPHRINE HCL (PRESSORS) 10 MG/ML IV SOLN
INTRAVENOUS | Status: DC | PRN
  Administered 2023-06-10 (×2): 80 ug via INTRAVENOUS

## 2023-06-10 MED ORDER — ENOXAPARIN SODIUM 30 MG/0.3ML IJ SOSY
30.0000 mg | PREFILLED_SYRINGE | INTRAMUSCULAR | Status: DC
Start: 2023-06-11 — End: 2023-06-11
  Filled 2023-06-10: qty 0.3

## 2023-06-10 MED ORDER — FENTANYL CITRATE (PF) 100 MCG/2ML IJ SOLN
INTRAMUSCULAR | Status: DC | PRN
  Administered 2023-06-10 (×2): 50 ug via INTRAVENOUS
  Administered 2023-06-10: 25 ug via INTRAVENOUS

## 2023-06-10 MED ORDER — ONDANSETRON 4 MG PO TBDP: 4.0000 mg | ORAL_TABLET | Freq: Four times a day (QID) | ORAL | Status: DC | PRN

## 2023-06-10 MED ORDER — HEPARIN (PORCINE) IN NACL 2-0.9 UNITS/ML
INTRAMUSCULAR | Status: AC | PRN
  Administered 2023-06-10: 500 mL

## 2023-06-10 MED ORDER — BUPIVACAINE-EPINEPHRINE (PF) 0.25% -1:200000 IJ SOLN
INTRAMUSCULAR | Status: AC
  Filled 2023-06-10: qty 30

## 2023-06-10 MED ORDER — LACTATED RINGERS IV SOLN: INTRAVENOUS | Status: DC

## 2023-06-10 MED ORDER — DEXAMETHASONE SODIUM PHOSPHATE 4 MG/ML IJ SOLN
INTRAMUSCULAR | Status: DC | PRN
  Administered 2023-06-10: 4 mg via INTRAVENOUS

## 2023-06-10 MED ORDER — ONDANSETRON HCL 4 MG/2ML IJ SOLN: 4.0000 mg | Freq: Once | INTRAMUSCULAR | Status: DC | PRN

## 2023-06-10 MED ORDER — METHOCARBAMOL 500 MG PO TABS: 500.0000 mg | ORAL_TABLET | Freq: Four times a day (QID) | ORAL | Status: DC | PRN

## 2023-06-10 MED ORDER — GABAPENTIN 100 MG PO CAPS
100.0000 mg | ORAL_CAPSULE | ORAL | Status: AC
  Administered 2023-06-10: 100 mg via ORAL

## 2023-06-10 MED ORDER — ACETAMINOPHEN 500 MG PO TABS: 1000.0000 mg | ORAL_TABLET | ORAL | Status: AC

## 2023-06-10 MED ORDER — CEFAZOLIN SODIUM-DEXTROSE 2-4 GM/100ML-% IV SOLN
2.0000 g | INTRAVENOUS | Status: AC
  Administered 2023-06-10: 2 g via INTRAVENOUS

## 2023-06-10 MED ORDER — GABAPENTIN 100 MG PO CAPS
ORAL_CAPSULE | ORAL | Status: AC
  Filled 2023-06-10: qty 1

## 2023-06-10 MED ORDER — PANTOPRAZOLE SODIUM 40 MG IV SOLR
40.0000 mg | Freq: Every day | INTRAVENOUS | Status: DC
  Filled 2023-06-10: qty 10

## 2023-06-10 MED ORDER — BUPIVACAINE-EPINEPHRINE 0.25% -1:200000 IJ SOLN
INTRAMUSCULAR | Status: DC | PRN
  Administered 2023-06-10: 7 mL

## 2023-06-10 MED ORDER — HEPARIN SOD (PORK) LOCK FLUSH 100 UNIT/ML IV SOLN
INTRAVENOUS | Status: AC
  Filled 2023-06-10: qty 5

## 2023-06-10 MED ORDER — GABAPENTIN 100 MG PO CAPS: 100.0000 mg | ORAL_CAPSULE | Freq: Three times a day (TID) | ORAL | Status: DC | PRN

## 2023-06-10 MED ORDER — ACETAMINOPHEN 500 MG PO TABS
ORAL_TABLET | ORAL | Status: AC
  Filled 2023-06-10: qty 2

## 2023-06-10 MED ORDER — PROPOFOL 10 MG/ML IV BOLUS
INTRAVENOUS | Status: AC
Start: 2023-06-10 — End: ?
  Filled 2023-06-10: qty 20

## 2023-06-10 MED ORDER — METFORMIN HCL 500 MG PO TABS
1000.0000 mg | ORAL_TABLET | Freq: Two times a day (BID) | ORAL | Status: DC
  Filled 2023-06-10: qty 2

## 2023-06-10 MED ORDER — ACETAMINOPHEN 500 MG PO TABS
1000.0000 mg | ORAL_TABLET | ORAL | Status: AC
  Administered 2023-06-10: 1000 mg via ORAL

## 2023-06-10 MED ORDER — OXYCODONE HCL 5 MG PO TABS: 5.0000 mg | ORAL_TABLET | Freq: Four times a day (QID) | ORAL | 0 refills | Status: DC | PRN

## 2023-06-10 MED ORDER — CEFAZOLIN SODIUM-DEXTROSE 2-4 GM/100ML-% IV SOLN: 2.0000 g | INTRAVENOUS | Status: AC

## 2023-06-10 MED ORDER — LOSARTAN POTASSIUM 50 MG PO TABS
100.0000 mg | ORAL_TABLET | Freq: Every day | ORAL | Status: DC
  Filled 2023-06-10: qty 2

## 2023-06-10 MED ORDER — METHOCARBAMOL 500 MG PO TABS: 500.0000 mg | ORAL_TABLET | Freq: Three times a day (TID) | ORAL | 3 refills | Status: DC | PRN

## 2023-06-10 MED ORDER — AMLODIPINE BESYLATE 10 MG PO TABS
10.0000 mg | ORAL_TABLET | Freq: Every day | ORAL | Status: DC
  Administered 2023-06-10: 10 mg via ORAL
  Filled 2023-06-10: qty 1

## 2023-06-10 MED ORDER — LOSARTAN POTASSIUM 50 MG PO TABS
100.0000 mg | ORAL_TABLET | Freq: Every day | ORAL | Status: DC
Start: 2023-06-10 — End: 2023-06-11
  Administered 2023-06-10: 100 mg via ORAL
  Filled 2023-06-10: qty 2

## 2023-06-10 MED ORDER — ONDANSETRON HCL 4 MG/2ML IJ SOLN
INTRAMUSCULAR | Status: DC | PRN
  Administered 2023-06-10: 4 mg via INTRAVENOUS

## 2023-06-10 MED ORDER — ONDANSETRON HCL 4 MG/2ML IJ SOLN
INTRAMUSCULAR | Status: AC
Start: 2023-06-10 — End: ?
  Filled 2023-06-10: qty 2

## 2023-06-10 MED ORDER — PROPOFOL 10 MG/ML IV BOLUS
INTRAVENOUS | Status: DC | PRN
  Administered 2023-06-10: 200 mg via INTRAVENOUS

## 2023-06-10 MED ORDER — OXYCODONE HCL 5 MG PO TABS
5.0000 mg | ORAL_TABLET | ORAL | Status: DC | PRN
Start: 2023-06-10 — End: 2023-06-11

## 2023-06-10 MED ORDER — HEPARIN SOD (PORK) LOCK FLUSH 100 UNIT/ML IV SOLN
INTRAVENOUS | Status: DC | PRN
  Administered 2023-06-10: 400 [IU]

## 2023-06-10 MED ORDER — LIDOCAINE 2% (20 MG/ML) 5 ML SYRINGE
INTRAMUSCULAR | Status: AC
  Filled 2023-06-10: qty 5

## 2023-06-10 MED ORDER — CEFAZOLIN SODIUM-DEXTROSE 2-4 GM/100ML-% IV SOLN
INTRAVENOUS | Status: AC
  Filled 2023-06-10: qty 100

## 2023-06-10 MED ORDER — SODIUM CHLORIDE FLUSH 0.9 % IV SOLN
INTRAVENOUS | Status: AC
Start: 2023-06-10 — End: ?
  Filled 2023-06-10: qty 10

## 2023-06-10 MED ORDER — ONDANSETRON HCL 4 MG/2ML IJ SOLN: 4.0000 mg | Freq: Four times a day (QID) | INTRAMUSCULAR | Status: DC | PRN

## 2023-06-10 MED ORDER — LIDOCAINE HCL (CARDIAC) PF 100 MG/5ML IV SOSY
PREFILLED_SYRINGE | INTRAVENOUS | Status: DC | PRN
  Administered 2023-06-10: 60 mg via INTRAVENOUS

## 2023-06-10 MED ORDER — DEXAMETHASONE SODIUM PHOSPHATE 10 MG/ML IJ SOLN
INTRAMUSCULAR | Status: AC
  Filled 2023-06-10: qty 1

## 2023-06-10 MED ORDER — AMISULPRIDE (ANTIEMETIC) 5 MG/2ML IV SOLN: 10.0000 mg | Freq: Once | INTRAVENOUS | Status: DC | PRN

## 2023-06-10 MED ORDER — HEPARIN (PORCINE) IN NACL 1000-0.9 UT/500ML-% IV SOLN
INTRAVENOUS | Status: AC
Start: 2023-06-10 — End: ?
  Filled 2023-06-10: qty 500

## 2023-06-10 MED ORDER — GABAPENTIN 100 MG PO CAPS: 100.0000 mg | ORAL_CAPSULE | Freq: Three times a day (TID) | ORAL | 2 refills | Status: DC | PRN

## 2023-06-10 MED ORDER — CHLORHEXIDINE GLUCONATE CLOTH 2 % EX PADS
6.0000 | MEDICATED_PAD | Freq: Once | CUTANEOUS | Status: DC
Start: 2023-06-10 — End: 2023-06-10

## 2023-06-10 MED ORDER — 0.9 % SODIUM CHLORIDE (POUR BTL) OPTIME
TOPICAL | Status: DC | PRN
  Administered 2023-06-10: 1000 mL

## 2023-06-10 SURGICAL SUPPLY — 63 items
ADH SKN CLS APL DERMABOND .7 (GAUZE/BANDAGES/DRESSINGS) ×2
APL PRP STRL LF DISP 70% ISPRP (MISCELLANEOUS) ×4
APPLIER CLIP 11 MED OPEN (CLIP) ×2
APR CLP MED 11 20 MLT OPN (CLIP) ×2
BAG DECANTER FOR FLEXI CONT (MISCELLANEOUS) ×2 IMPLANT
BINDER BREAST LRG (GAUZE/BANDAGES/DRESSINGS) IMPLANT
BIOPATCH RED 1 DISK 7.0 (GAUZE/BANDAGES/DRESSINGS) ×2 IMPLANT
BLADE SURG 10 STRL SS (BLADE) ×2 IMPLANT
BLADE SURG 15 STRL LF DISP TIS (BLADE) ×2 IMPLANT
BLADE SURG 15 STRL SS (BLADE) ×2
CANISTER SUCT 1200ML W/VALVE (MISCELLANEOUS) ×2 IMPLANT
CHLORAPREP W/TINT 26 (MISCELLANEOUS) ×2 IMPLANT
CLEANER CAUTERY TIP PAD (MISCELLANEOUS) ×2 IMPLANT
CLIP APPLIE 11 MED OPEN (CLIP) ×2 IMPLANT
COVER BACK TABLE 60X90IN (DRAPES) ×2 IMPLANT
COVER MAYO STAND STRL (DRAPES) ×2 IMPLANT
DERMABOND ADVANCED .7 DNX12 (GAUZE/BANDAGES/DRESSINGS) ×2 IMPLANT
DRAIN CHANNEL 19F RND (DRAIN) ×2 IMPLANT
DRAPE C-ARM 42X72 X-RAY (DRAPES) ×2 IMPLANT
DRAPE LAPAROSCOPIC ABDOMINAL (DRAPES) ×2 IMPLANT
DRAPE UTILITY XL STRL (DRAPES) ×2 IMPLANT
DRSG TEGADERM 2-3/8X2-3/4 SM (GAUZE/BANDAGES/DRESSINGS) ×2 IMPLANT
ELECT COATED BLADE 2.86 ST (ELECTRODE) ×2 IMPLANT
ELECT REM PT RETURN 9FT ADLT (ELECTROSURGICAL) ×2
ELECTRODE REM PT RTRN 9FT ADLT (ELECTROSURGICAL) ×2 IMPLANT
EVACUATOR SILICONE 100CC (DRAIN) ×2 IMPLANT
GAUZE 4X4 16PLY ~~LOC~~+RFID DBL (SPONGE) ×2 IMPLANT
GAUZE PAD ABD 8X10 STRL (GAUZE/BANDAGES/DRESSINGS) IMPLANT
GLOVE BIO SURGEON STRL SZ7.5 (GLOVE) ×2 IMPLANT
GOWN STRL REUS W/ TWL LRG LVL3 (GOWN DISPOSABLE) ×4 IMPLANT
GOWN STRL REUS W/TWL LRG LVL3 (GOWN DISPOSABLE) ×4
IV KIT MINILOC 20X1 SAFETY (NEEDLE) IMPLANT
KIT PORT INFUSION SMART 8FR (Port) IMPLANT
NDL HYPO 22X1.5 SAFETY MO (MISCELLANEOUS) IMPLANT
NDL HYPO 25X1 1.5 SAFETY (NEEDLE) ×2 IMPLANT
NDL SAFETY ECLIPSE 18X1.5 (NEEDLE) IMPLANT
NDL SPNL 22GX3.5 QUINCKE BK (NEEDLE) IMPLANT
NEEDLE HYPO 22X1.5 SAFETY MO (MISCELLANEOUS)
NEEDLE HYPO 25X1 1.5 SAFETY (NEEDLE) ×2
NEEDLE SPNL 22GX3.5 QUINCKE BK (NEEDLE)
NS IRRIG 1000ML POUR BTL (IV SOLUTION) ×2 IMPLANT
PACK BASIN DAY SURGERY FS (CUSTOM PROCEDURE TRAY) ×2 IMPLANT
PENCIL SMOKE EVACUATOR (MISCELLANEOUS) ×2 IMPLANT
SLEEVE SCD COMPRESS KNEE MED (STOCKING) ×2 IMPLANT
SPIKE FLUID TRANSFER (MISCELLANEOUS) IMPLANT
SPONGE T-LAP 18X18 ~~LOC~~+RFID (SPONGE) ×2 IMPLANT
STAPLER SKIN PROX WIDE 3.9 (STAPLE) ×2 IMPLANT
SUT ETHILON 3 0 PS 1 (SUTURE) ×2 IMPLANT
SUT MON AB 4-0 PC3 18 (SUTURE) ×2 IMPLANT
SUT PROLENE 2 0 SH DA (SUTURE) ×2 IMPLANT
SUT SILK 2 0 TIES 17X18 (SUTURE)
SUT SILK 2-0 18XBRD TIE BLK (SUTURE) IMPLANT
SUT VIC AB 3-0 54X BRD REEL (SUTURE) IMPLANT
SUT VIC AB 3-0 BRD 54 (SUTURE)
SUT VIC AB 3-0 SH 27 (SUTURE) ×4
SUT VIC AB 3-0 SH 27X BRD (SUTURE) ×2 IMPLANT
SYR 10ML LL (SYRINGE) IMPLANT
SYR 5ML LL (SYRINGE) ×2 IMPLANT
SYR BULB EAR ULCER 3OZ GRN STR (SYRINGE) ×2 IMPLANT
SYR CONTROL 10ML LL (SYRINGE) ×4 IMPLANT
TOWEL GREEN STERILE FF (TOWEL DISPOSABLE) ×4 IMPLANT
TUBE CONNECTING 20X1/4 (TUBING) ×2 IMPLANT
YANKAUER SUCT BULB TIP NO VENT (SUCTIONS) ×2 IMPLANT

## 2023-06-10 NOTE — H&P (Signed)
MRN: [id number] DOB: 20-May-1946 Subjective   Chief Complaint: Post Operative Visit   History of Present Illness: Melanie Welch is a 77 y.o. female who is seen today for left breast cancer. The patient is a 77 year old black female who is about 2 weeks status post left mastectomy and sentinel node biopsy with targeted node dissection. Her margins were clean. She had 3 of 4 nodes positive. The cancer was ER and PR positive and HER2 negative with a Ki-67 of 50%. She tolerated the surgery well. Her drain is putting out about 50 to 60 cc a day    Review of Systems: A complete review of systems was obtained from the patient. I have reviewed this information and discussed as appropriate with the patient. See HPI as well for other ROS.  ROS   Medical History: Past Medical History:  Diagnosis Date  Chronic kidney disease  Diabetes mellitus without complication (CMS/HHS-HCC)  History of cancer  Liver disease  Thyroid disease   Patient Active Problem List  Diagnosis  Malignant neoplasm of upper-outer quadrant of left female breast (CMS/HHS-HCC)   Past Surgical History:  Procedure Laterality Date  LT MASTY, SN BX w/TARGETED NODE DISSECTION 05/06/2023  Dr. Carolynne Edouard  HYSTERECTOMY    Allergies  Allergen Reactions  Atorvastatin Vomiting  VOMITING   Current Outpatient Medications on File Prior to Visit  Medication Sig Dispense Refill  amLODIPine (NORVASC) 10 MG tablet Take by mouth  ascorbic acid, vitamin C, (VITAMIN C) 250 MG tablet Take by mouth  cholecalciferol (VITAMIN D3) 400 unit tablet Take by mouth  coenzyme Q10 10 mg capsule Take by mouth  losartan (COZAAR) 100 MG tablet Take by mouth  metFORMIN (GLUCOPHAGE) 1000 MG tablet Take 1,000 mg by mouth 2 (two) times daily  multivitamin (MULTIVITAMIN) tablet Take by mouth  omega-3 fatty acids-fish oil 300-1,000 mg capsule Take by mouth   No current facility-administered medications on file prior to visit.   Family History  Problem  Relation Age of Onset  Diabetes Sister    Social History   Tobacco Use  Smoking Status Former  Smokeless Tobacco Never    Social History   Socioeconomic History  Marital status: Single  Tobacco Use  Smoking status: Former  Smokeless tobacco: Never  Substance and Sexual Activity  Alcohol use: Never  Drug use: Never   Social Drivers of Corporate investment banker Strain: Low Risk (04/29/2021)  [facility]  Overall Financial Resource Strain (CARDIA)  Difficulty of Paying Living Expenses: Not hard at all  Food Insecurity: No Food Insecurity (05/06/2023)  Received from [facility]  Hunger Vital Sign  Worried About Running Out of Food in the Last Year: Never true  Ran Out of Food in the Last Year: Never true  Recent Concern: Food Insecurity - Food Insecurity Present (04/12/2023)  Received from [facility]  Hunger Vital Sign  Worried About Running Out of Food in the Last Year: Sometimes true  Ran Out of Food in the Last Year: Sometimes true  Transportation Needs: No Transportation Needs (05/06/2023)  Received from [facility] - Transportation  Lack of Transportation (Medical): No  Lack of Transportation (Non-Medical): No  Physical Activity: Inactive (09/23/2021)  Received from [facility]  Exercise Vital Sign  Days of Exercise per Week: 0 days  Minutes of Exercise per Session: 0 min  Stress: No Stress Concern Present (04/29/2021)  Received from [facility] - Occupational Stress Questionnaire  Feeling of Stress : Not at all  Received from [facility]  Social Network   Objective:   Vitals:  PainSc: 0-No pain  PainLoc: Breast   There is no height or weight on file to calculate BMI.  Physical Exam Vitals reviewed.  Constitutional:  General: She is not in acute distress. Appearance: Normal appearance.  HENT:  Head: Normocephalic and atraumatic.  Right Ear: External ear normal.  Left  Ear: External ear normal.  Nose: Nose normal.  Mouth/Throat:  Mouth: Mucous membranes are moist.  Pharynx: Oropharynx is clear.  Eyes:  General: No scleral icterus. Extraocular Movements: Extraocular movements intact.  Conjunctiva/sclera: Conjunctivae normal.  Pupils: Pupils are equal, round, and reactive to light.  Cardiovascular:  Rate and Rhythm: Normal rate and regular rhythm.  Pulses: Normal pulses.  Heart sounds: Normal heart sounds.  Pulmonary:  Effort: Pulmonary effort is normal. No respiratory distress.  Breath sounds: Normal breath sounds.  Abdominal:  General: Bowel sounds are normal.  Palpations: Abdomen is soft.  Tenderness: There is no abdominal tenderness.  Musculoskeletal:  General: No swelling, tenderness or deformity. Normal range of motion.  Cervical back: Normal range of motion and neck supple.  Skin: General: Skin is warm and dry.  Coloration: Skin is not jaundiced.  Neurological:  General: No focal deficit present.  Mental Status: She is alert and oriented to person, place, and time.  Psychiatric:  Mood and Affect: Mood normal.  Behavior: Behavior normal.     Breast: The left mastectomy incision is healing nicely with no sign of infection or seroma. The skin flaps are healthy. The drain is intact. The drainage is clear but dark  Labs, Imaging and Diagnostic Testing:  Assessment and Plan:   Diagnoses and all orders for this visit:  Malignant neoplasm of upper-outer quadrant of left female breast, unspecified estrogen receptor status (CMS/HHS-HCC)    The patient is about 2 weeks status post left mastectomy for breast cancer. Since she had multiple positive nodes the recommendation would be for completion node dissection. I have discussed with her in detail the risks and benefits of the operation as well as some of the technical aspects and she understands and wishes to proceed. We will plan to get this done for in the very near future. She will  continue to follow-up with medical and radiation oncology for adjuvant therapy. We will also plan for port placement

## 2023-06-10 NOTE — Anesthesia Preprocedure Evaluation (Addendum)
Anesthesia Evaluation  Patient identified by MRN, date of birth, ID band Patient awake    Reviewed: Allergy & Precautions, NPO status , Patient's Chart, lab work & pertinent test results  Airway Mallampati: II  TM Distance: >3 FB Neck ROM: Full    Dental no notable dental hx.    Pulmonary former smoker   Pulmonary exam normal        Cardiovascular hypertension, Pt. on medications Normal cardiovascular exam     Neuro/Psych negative neurological ROS  negative psych ROS   GI/Hepatic negative GI ROS, Neg liver ROS,,,  Endo/Other  diabetes, Oral Hypoglycemic Agents    Renal/GU Renal disease     Musculoskeletal negative musculoskeletal ROS (+)    Abdominal   Peds  Hematology negative hematology ROS (+)   Anesthesia Other Findings LEFT BREAST CANCER  Reproductive/Obstetrics                             Anesthesia Physical Anesthesia Plan  ASA: 3  Anesthesia Plan: General   Post-op Pain Management:    Induction: Intravenous  PONV Risk Score and Plan: 3 and Ondansetron, Dexamethasone and Treatment may vary due to age or medical condition  Airway Management Planned: LMA  Additional Equipment:   Intra-op Plan:   Post-operative Plan: Extubation in OR  Informed Consent: I have reviewed the patients History and Physical, chart, labs and discussed the procedure including the risks, benefits and alternatives for the proposed anesthesia with the patient or authorized representative who has indicated his/her understanding and acceptance.     Dental advisory given  Plan Discussed with: CRNA  Anesthesia Plan Comments:        Anesthesia Quick Evaluation

## 2023-06-10 NOTE — Interval H&P Note (Signed)
History and Physical Interval Note:  06/10/2023 1:20 PM  Melanie Welch  has presented today for surgery, with the diagnosis of LEFT BREAST CANCER.  The various methods of treatment have been discussed with the patient and family. After consideration of risks, benefits and other options for treatment, the patient has consented to  Procedure(s): COMPLETION LEFT AXILLARY LYMPH NODE DISSECTION (Left) INSERTION PORT-A-CATH WITH ULTRASOUND GUIDANCE (N/A) as a surgical intervention.  The patient's history has been reviewed, patient examined, no change in status, stable for surgery.  I have reviewed the patient's chart and labs.  Questions were answered to the patient's satisfaction.     [name]

## 2023-06-10 NOTE — Transfer of Care (Signed)
Immediate Anesthesia Transfer of Care Note  Patient: Melanie Welch  Procedure(s) Performed: COMPLETION LEFT AXILLARY LYMPH NODE DISSECTION (Left: Axilla) INSERTION PORT-A-CATH WITH ULTRASOUND GUIDANCE (Chest)  Patient Location: PACU  Anesthesia Type:General  Level of Consciousness: sedated  Airway & Oxygen Therapy: Patient Spontanous Breathing and Patient connected to face mask oxygen  Post-op Assessment: Report given to RN and Post -op Vital signs reviewed and stable  Post vital signs: Reviewed and stable  Last Vitals:  Vitals Value Taken Time  BP 162/81 06/10/23 1709  Temp    Pulse 95 06/10/23 1712  Resp 14 06/10/23 1712  SpO2 100 % 06/10/23 1712  Vitals shown include unfiled device data.  Last Pain:  Vitals:   06/10/23 1333  TempSrc: Temporal  PainSc: 0-No pain      Patients Stated Pain Goal: 1 (06/10/23 1333)  Complications: No notable events documented.

## 2023-06-10 NOTE — OR Nursing (Signed)
JP drain, Left chest removed by Dr Carolynne Edouard, in tact, in OR

## 2023-06-10 NOTE — Op Note (Signed)
06/10/2023  4:59 PM  PATIENT:  Melanie Welch  77 y.o. female  PRE-OPERATIVE DIAGNOSIS:  LEFT BREAST CANCER  POST-OPERATIVE DIAGNOSIS:  LEFT BREAST CANCER  PROCEDURE:  Procedure(s): COMPLETION LEFT AXILLARY LYMPH NODE DISSECTION (Left) INSERTION PORT-A-CATH   SURGEON:  Surgeons and Role:    * Griselda Miner, MD - Primary  PHYSICIAN ASSISTANT:   ASSISTANTS: none   ANESTHESIA:   local and general  EBL:  30 mL   BLOOD ADMINISTERED:none  DRAINS: (1) Blake drain(s) in the left axilla    LOCAL MEDICATIONS USED:  MARCAINE     SPECIMEN:  Source of Specimen:  left axillary contents  DISPOSITION OF SPECIMEN:  PATHOLOGY  COUNTS:  YES  TOURNIQUET:  * No tourniquets in log *  DICTATION: .Dragon Dictation  After informed consent was obtained the patient was brought to the operating room and placed in the supine position on the operating table.  After adequate induction of general anesthesia a roll was placed between the patient's shoulder blades to extend the shoulder slightly.  The patient's bilateral chest, breast, and neck and axillary area were prepped with ChloraPrep, allowed to dry, and draped in usual sterile manner.  An appropriate timeout was performed.  The patient was placed in Trendelenburg position.  The area lateral to the bend of the clavicle on the right chest wall was infiltrated with quarter percent Marcaine.  A large bore needle from the Port-A-Cath kit was used to slide beneath the bend of the clavicle heading towards the sternal notch and in doing so I was able to access the right subclavian vein without difficulty.  A wire was fed through the needle using the Seldinger technique without difficulty.  The wire was confirmed in the central venous system using real-time fluoroscopy.  Next a small incision was made at the wire entry site.  The incision was carried through the skin and subcutaneous tissue sharply with the electrocautery.  A subcutaneous pocket was created  inferior to the incision by blunt finger dissection.  Next the tubing was placed on the reservoir.  The reservoir was placed in the pocket and the length of the tubing was estimated using real-time fluoroscopy.  The tubing was cut to the appropriate length.  Next the sheath and dilator were fed over the wire using the Seldinger technique without difficulty.  The dilator and wire were removed from the patient.  The tubing was fed through the sheath as far as it would go and then held in place while the sheath was gently cracked and separated.  Another real-time fluoroscopy image showed the tip of the catheter to be in the distal superior vena cava.  The tubing was then permanently anchored to the reservoir.  The reservoir was anchored in the pocket with two 2-0 Prolene stitches.  The port was then aspirated and it aspirated blood easily.  The port was then flushed initially with a dilute heparin solution and then with a more concentrated heparin solution.  The subcutaneous tissue was closed over the port with interrupted 3-0 Vicryl stitches.  The skin was closed with interrupted 4-0 Monocryl subcuticular stitches.  Dermabond dressings were applied.  Attention was then turned to the left axilla.  The lateral portion of the mastectomy incision was then reopened sharply with a 15 blade knife and electrocautery.  Dissection was carried towards the chest wall.  I was able to identify the old mastectomy cavity.  I dissected laterally until I could identify the serratus muscle medially and  the latissimus muscle laterally.  The deep left axillary space was entered at this point.  I then carried the dissection superiorly until I was able to identify the left axillary vein.  The contents within these boundaries was then dissected out by blunt right angle dissection and some sharp dissection with the electrocautery.  Several small vessels and intercostobrachial nerves were controlled with clips.  Once this dissection was  complete then the entire left axillary contents were sent to pathology for further evaluation care was taken to avoid injury to the thoracodorsal and long thoracic neurovascular complexes.  Hemostasis was achieved using the Bovie electrocautery.  The wound was irrigated with saline.  New small stab incision was made near the anterior axillary line inferior to the operative bed.  A hemostat was placed through this opening and used to bring a 19 [place] round Blake drain into the operative bed.  The drain was placed in the axilla.  The drain was anchored to the skin with a 3-0 nylon stitch.  The subcutaneous tissue of the incision was then closed with interrupted 3-0 Vicryl stitches.  The skin was then closed with a running 4-0 Monocryl subcuticular stitch.  Dermabond dressings and sterile drain dressings were applied.  The patient tolerated the procedure well.  At the end of the case all needle sponge and instrument counts were correct.  The patient was then awakened and taken to recovery in stable condition.  PLAN OF CARE: Admit for overnight observation  PATIENT DISPOSITION:  PACU - hemodynamically stable.   Delay start of Pharmacological VTE agent (>24hrs) due to surgical blood loss or risk of bleeding: no

## 2023-06-10 NOTE — Anesthesia Procedure Notes (Signed)
Procedure Name: LMA Insertion Date/Time: 06/10/2023 3:10 PM  Performed by: Burna Cash, CRNAPre-anesthesia Checklist: Patient identified, Emergency Drugs available, Suction available and Patient being monitored Patient Re-evaluated:Patient Re-evaluated prior to induction Oxygen Delivery Method: Circle system utilized Preoxygenation: Pre-oxygenation with 100% oxygen Induction Type: IV induction Ventilation: Mask ventilation without difficulty LMA: LMA inserted LMA Size: 4.0 Number of attempts: 1 Airway Equipment and Method: Bite block Placement Confirmation: positive ETCO2 Tube secured with: Tape Dental Injury: Teeth and Oropharynx as per pre-operative assessment

## 2023-06-11 DIAGNOSIS — I129 Hypertensive chronic kidney disease with stage 1 through stage 4 chronic kidney disease, or unspecified chronic kidney disease: Secondary | ICD-10-CM | POA: Diagnosis not present

## 2023-06-11 DIAGNOSIS — Z1721 Progesterone receptor positive status: Secondary | ICD-10-CM | POA: Diagnosis not present

## 2023-06-11 DIAGNOSIS — Z87891 Personal history of nicotine dependence: Secondary | ICD-10-CM | POA: Diagnosis not present

## 2023-06-11 DIAGNOSIS — E1122 Type 2 diabetes mellitus with diabetic chronic kidney disease: Secondary | ICD-10-CM | POA: Diagnosis not present

## 2023-06-11 DIAGNOSIS — Z1732 Human epidermal growth factor receptor 2 negative status: Secondary | ICD-10-CM | POA: Diagnosis not present

## 2023-06-11 DIAGNOSIS — N189 Chronic kidney disease, unspecified: Secondary | ICD-10-CM | POA: Diagnosis not present

## 2023-06-11 DIAGNOSIS — Z7984 Long term (current) use of oral hypoglycemic drugs: Secondary | ICD-10-CM | POA: Diagnosis not present

## 2023-06-11 DIAGNOSIS — C50412 Malignant neoplasm of upper-outer quadrant of left female breast: Secondary | ICD-10-CM | POA: Diagnosis not present

## 2023-06-11 DIAGNOSIS — Z79899 Other long term (current) drug therapy: Secondary | ICD-10-CM | POA: Diagnosis not present

## 2023-06-11 MED ORDER — OXYCODONE HCL 5 MG PO TABS: 5.0000 mg | ORAL_TABLET | Freq: Four times a day (QID) | ORAL | 0 refills | Status: DC | PRN

## 2023-06-11 NOTE — Discharge Instructions (Signed)
About my Jackson-Pratt Bulb Drain ° °What is a Jackson-Pratt bulb? °A Jackson-Pratt is a soft, round device used to collect drainage. It is connected to a long, thin drainage catheter, which is held in place by one or two small stiches near your surgical incision site. When the bulb is squeezed, it forms a vacuum, forcing the drainage to empty into the bulb. ° °Emptying the Jackson-Pratt bulb- °To empty the bulb: °1. Release the plug on the top of the bulb. °2. Pour the bulb's contents into a measuring container which your nurse will provide. °3. Record the time emptied and amount of drainage. Empty the drain(s) as often as your     doctor or nurse recommends. ° °Date                  Time                    Amount (Drain 1)                 Amount (Drain 2) ° °_____________________________________________________________________ ° °_____________________________________________________________________ ° °_____________________________________________________________________ ° °_____________________________________________________________________ ° °_____________________________________________________________________ ° °_____________________________________________________________________ ° °_____________________________________________________________________ ° °_____________________________________________________________________ ° °Squeezing the Jackson-Pratt Bulb- °To squeeze the bulb: °1. Make sure the plug at the top of the bulb is open. °2. Squeeze the bulb tightly in your fist. You will hear air squeezing from the bulb. °3. Replace the plug while the bulb is squeezed. °4. Use a safety pin to attach the bulb to your clothing. This will keep the catheter from     pulling at the bulb insertion site. ° °When to call your doctor- °Call your doctor if: °· Drain site becomes red, swollen or hot. °· You have a fever greater than 101 degrees F. °· There is oozing at the drain site. °· Drain falls out (apply a guaze  bandage over the drain hole and secure it with tape). °· Drainage increases daily not related to activity patterns. (You will usually have more drainage when you are active than when you are resting.) °· Drainage has a bad odor. ° ° °

## 2023-06-11 NOTE — Progress Notes (Signed)
1 Day Post-Op   Subjective/Chief Complaint: No complaints   Objective: Vital signs in last 24 hours: Temp:  [97.2 F (36.2 C)-98.5 F (36.9 C)] 97.8 F (36.6 C) (11/08 2200) Pulse Rate:  [71-104] 79 (11/09 0200) Resp:  [12-17] 16 (11/09 0200) BP: (124-167)/(77-94) 124/77 (11/09 0200) SpO2:  [94 %-100 %] 98 % (11/09 0200) Weight:  [67.4 kg] 67.4 kg (11/08 1333)    Intake/Output from previous day: 11/08 0701 - 11/09 0700 In: 700 [I.V.:700] Out: 1255 [Urine:1200; Drains:25; Blood:30] Intake/Output this shift: No intake/output data recorded.  General appearance: alert and cooperative Resp: clear to auscultation bilaterally Chest wall: skin flaps look good Cardio: regular rate and rhythm GI: soft, non-tender; bowel sounds normal; no masses,  no organomegaly  Lab Results:  No results for input(s): "WBC", "HGB", "HCT", "PLT" in the last 72 hours. BMET No results for input(s): "NA", "K", "CL", "CO2", "GLUCOSE", "BUN", "CREATININE", "CALCIUM" in the last 72 hours. PT/INR No results for input(s): "LABPROT", "INR" in the last 72 hours. ABG No results for input(s): "PHART", "HCO3" in the last 72 hours.  Invalid input(s): "PCO2", "PO2"  Studies/Results: DG Chest Port 1 View  Result Date: 06/10/2023 CLINICAL DATA:  Port-A-Cath placement EXAM: PORTABLE CHEST 1 VIEW COMPARISON:  None Available. FINDINGS: Right chest port catheter tip projects over the distal SVC. There surgical clips in the left axilla. The heart size and mediastinal contours are within normal limits. Both lungs are clear. There is no pleural effusion or pneumothorax. The visualized skeletal structures are unremarkable. IMPRESSION: Right chest port catheter tip projects over the distal SVC. No pneumothorax. Electronically Signed   By: Darliss Cheney M.D.   On: 06/10/2023 18:50   DG C-Arm 1-60 Min-No Report  Result Date: 06/10/2023 Fluoroscopy was utilized by the requesting physician.  No radiographic interpretation.     Anti-infectives: Anti-infectives (From admission, onward)    Start     Dose/Rate Route Frequency Ordered Stop   06/11/23 0600  ceFAZolin (ANCEF) IVPB 2g/100 mL premix        2 g 200 mL/hr over 30 Minutes Intravenous On call to O.R. 06/10/23 1300 06/10/23 1526   06/11/23 0600  ceFAZolin (ANCEF) IVPB 2g/100 mL premix        2 g 200 mL/hr over 30 Minutes Intravenous On call to O.R. 06/10/23 1300 06/10/23 1412       Assessment/Plan: s/p Procedure(s): COMPLETION LEFT AXILLARY LYMPH NODE DISSECTION (Left) INSERTION PORT-A-CATH WITH ULTRASOUND GUIDANCE (N/A) Advance diet Discharge  LOS: 0 days    Melanie Welch 06/11/2023

## 2023-06-13 ENCOUNTER — Other Ambulatory Visit: Payer: Self-pay | Admitting: Hematology and Oncology

## 2023-06-13 DIAGNOSIS — C50412 Malignant neoplasm of upper-outer quadrant of left female breast: Secondary | ICD-10-CM

## 2023-06-13 NOTE — Anesthesia Postprocedure Evaluation (Signed)
Anesthesia Post Note  Patient: Melanie Welch  Procedure(s) Performed: COMPLETION LEFT AXILLARY LYMPH NODE DISSECTION (Left: Axilla) INSERTION PORT-A-CATH WITH ULTRASOUND GUIDANCE (Chest)     Patient location during evaluation: PACU Anesthesia Type: General Level of consciousness: awake Pain management: pain level controlled Vital Signs Assessment: post-procedure vital signs reviewed and stable Respiratory status: spontaneous breathing, nonlabored ventilation and respiratory function stable Cardiovascular status: blood pressure returned to baseline and stable Postop Assessment: no apparent nausea or vomiting Anesthetic complications: no   No notable events documented.  Last Vitals:  Vitals:   06/11/23 0200 06/11/23 0700  BP: 124/77 132/75  Pulse: 79 76  Resp: 16 16  Temp:  36.9 C  SpO2: 98% 98%    Last Pain:  Vitals:   06/11/23 0800  TempSrc:   PainSc: 0-No pain   Pain Goal: Patients Stated Pain Goal: 1 (06/10/23 1333)                 Oley Lahaie P Kely Dohn

## 2023-06-13 NOTE — Progress Notes (Addendum)
Pharmacist Chemotherapy Monitoring - Initial Assessment    Anticipated start date: 06/20/2023   The following has been reviewed per standard work regarding the patient's treatment regimen: The patient's diagnosis, treatment plan and drug doses, and organ/hematologic function Lab orders and baseline tests specific to treatment regimen  The treatment plan start date, drug sequencing, and pre-medications Prior authorization status  Patient's documented medication list, including drug-drug interaction screen and prescriptions for anti-emetics and supportive care specific to the treatment regimen The drug concentrations, fluid compatibility, administration routes, and timing of the medications to be used The patient's access for treatment and lifetime cumulative dose history, if applicable  The patient's medication allergies and previous infusion related reactions, if applicable   Changes made to treatment plan:  switch to insurance preferred biosimilar Fulphila for C1D3 per insurance preference d/t Udenyca backorder  Follow up needed:  Pending authorization for treatment   Drusilla Kanner, PharmD, MBA

## 2023-06-14 ENCOUNTER — Inpatient Hospital Stay: Payer: Medicare HMO

## 2023-06-14 ENCOUNTER — Inpatient Hospital Stay: Payer: Medicare HMO | Admitting: Pharmacist

## 2023-06-14 ENCOUNTER — Encounter: Payer: Self-pay | Admitting: *Deleted

## 2023-06-14 ENCOUNTER — Encounter (HOSPITAL_BASED_OUTPATIENT_CLINIC_OR_DEPARTMENT_OTHER): Payer: Self-pay | Admitting: General Surgery

## 2023-06-14 ENCOUNTER — Other Ambulatory Visit: Payer: Self-pay

## 2023-06-14 DIAGNOSIS — Z5189 Encounter for other specified aftercare: Secondary | ICD-10-CM | POA: Diagnosis not present

## 2023-06-14 DIAGNOSIS — E119 Type 2 diabetes mellitus without complications: Secondary | ICD-10-CM | POA: Diagnosis not present

## 2023-06-14 DIAGNOSIS — Z01812 Encounter for preprocedural laboratory examination: Secondary | ICD-10-CM | POA: Diagnosis not present

## 2023-06-14 DIAGNOSIS — C50412 Malignant neoplasm of upper-outer quadrant of left female breast: Secondary | ICD-10-CM

## 2023-06-14 DIAGNOSIS — Z9012 Acquired absence of left breast and nipple: Secondary | ICD-10-CM | POA: Diagnosis not present

## 2023-06-14 DIAGNOSIS — Z5111 Encounter for antineoplastic chemotherapy: Secondary | ICD-10-CM | POA: Diagnosis not present

## 2023-06-14 DIAGNOSIS — Z170 Estrogen receptor positive status [ER+]: Secondary | ICD-10-CM | POA: Diagnosis not present

## 2023-06-14 LAB — SURGICAL PATHOLOGY

## 2023-06-14 NOTE — Progress Notes (Signed)
[facility]       Telephone: [phone]?Fax: [phone]   Oncology Clinical Pharmacist Practitioner Initial Assessment  Melanie Welch is a 77 y.o. female with a diagnosis of breast cancer. They were contacted today via in-person visit. She is accompanied by her sister Melanie Welch.  Indication/Regimen Docetaxel (Taxotere) and cyclophosphamide (Cytoxan) are being used appropriately for treatment of breast cancer by Dr. Serena Croissant.      Wt Readings from Last 1 Encounters:  06/10/23 148 lb 9.4 oz (67.4 kg)    Estimated body surface area is 1.8 meters squared as calculated from the following:   Height as of 06/10/23: 5\' 8"  (1.727 m).   Weight as of 06/10/23: 148 lb 9.4 oz (67.4 kg).  The dosing regimen cycle is every 21 days x 4 cycles  Docetaxel (75 mg/m2) on Day 1 Cyclophosphamide (600 mg/m2) on Day 1 Pegfilgrastim (6 mg) on Day 3  It is planned to continue until treatment plan completion or unacceptable toxicity. The tentative start date is: 06/20/23.  Dose Modifications per Dr. Pamelia Hoit Docetaxel reduced from 75 mg/m2 to 60 mg/m2 Cyclophosphamide reduced from 600 mg/m2 to 500 mg/m2  Allergies Allergies  Allergen Reactions   Lipitor [Atorvastatin] Nausea And Vomiting    Vitals: No vitals or labs were done today for this chemotherapy education visit     06/11/2023    7:00 AM 06/11/2023    2:00 AM 06/10/2023   10:00 PM  Oncology Vitals  Temp 98.4 F (36.9 C)  97.8 F (36.6 C)  Pulse Rate 76 79 84  BP 132/75 124/77 145/86  Resp 16 16 16   SpO2 98 % 98 % 97 %     Laboratory Data    Latest Ref Rng & Units 05/04/2023   10:08 AM 07/14/2022   12:05 PM 09/03/2019   12:49 PM  CBC EXTENDED  WBC 4.0 - 10.5 K/uL 5.4  4.3  13.7   RBC 3.87 - 5.11 MIL/uL 4.04  4.33  4.84   Hemoglobin 12.0 - 15.0 g/dL 63.8  75.6  43.3   HCT 36.0 - 46.0 % 34.5  35.5  40.8   Platelets 150 - 400 K/uL 220  219  206        Latest Ref Rng & Units 06/06/2023    1:42 PM 05/04/2023    10:08 AM 07/14/2022   12:05 PM  CMP  Glucose 70 - 99 mg/dL 295  188  416   BUN 8 - 23 mg/dL 15  11  13    Creatinine 0.44 - 1.00 mg/dL 6.06  3.01  6.01   Sodium 135 - 145 mmol/L 141  138  139   Potassium 3.5 - 5.1 mmol/L 4.1  4.1  3.8   Chloride 98 - 111 mmol/L 104  103  101   CO2 22 - 32 mmol/L 22  27  26    Calcium 8.9 - 10.3 mg/dL 09.3  9.5  9.3   Total Protein 6.0 - 8.5 g/dL   7.3   Total Bilirubin 0.0 - 1.2 mg/dL   0.3   Alkaline Phos 44 - 121 IU/L   41   AST 0 - 40 IU/L   17   ALT 0 - 32 IU/L   13    Contraindications Contraindications were reviewed? Yes Contraindications to therapy were identified? No   Safety Precautions The following safety precautions were reviewed:  Fever: reviewed the importance of having a thermometer and the Centers for Disease Control and Prevention (  CDC) definition of fever which is 100.88F (38C) or higher. Patient should call 24/7 triage at [phone] if experiencing a fever or any other symptoms Decreased white blood cells (WBCs) and increased risk for infection Decreased platelet count and increased risk of bleeding Decreased hemoglobin, part of the red blood cells that carry iron and oxygen Hair Loss Fatigue Fluid retention or swelling (edema) Peripheral Neuropathy Mouth sores Rash or itchy skin Muscle or joint pain or weakness Nausea or vomiting Diarrhea Nail Changes Hypersensitivity reactions Secondary Malignancies Hemorrhagic cystitis Pneumonitis Handling body fluids and waste Intimacy, sexual activity, contraception, fertility  Medication Reconciliation Current Outpatient Medications  Medication Sig Dispense Refill   amLODipine (NORVASC) 10 MG tablet Take 10 mg by mouth at bedtime.      losartan (COZAAR) 100 MG tablet Take 100 mg by mouth daily.     metFORMIN (GLUCOPHAGE) 1000 MG tablet Take 1,000 mg by mouth 2 (two) times daily with a meal.  0   dexamethasone (DECADRON) 4 MG tablet Take 1 tablet day before chemo and 1  tablet day after chemo with food (Patient not taking: Reported on 06/14/2023) 8 tablet 0   lidocaine-prilocaine (EMLA) cream Apply to affected area once (Patient not taking: Reported on 06/14/2023) 30 g 3   methocarbamol (ROBAXIN) 500 MG tablet Take 1 tablet (500 mg total) by mouth every 8 (eight) hours as needed for muscle spasms. (Patient not taking: Reported on 06/14/2023) 30 tablet 3   ondansetron (ZOFRAN) 8 MG tablet Take 1 tablet (8 mg total) by mouth every 8 (eight) hours as needed for nausea or vomiting. Start on the third day after chemotherapy. (Patient not taking: Reported on 06/14/2023) 30 tablet 1   oxyCODONE (OXY IR/ROXICODONE) 5 MG immediate release tablet Take 1 tablet (5 mg total) by mouth every 6 (six) hours as needed for moderate pain (pain score 4-6). (Patient not taking: Reported on 06/14/2023) 15 tablet 0   prochlorperazine (COMPAZINE) 10 MG tablet Take 1 tablet (10 mg total) by mouth every 6 (six) hours as needed for nausea or vomiting. (Patient not taking: Reported on 06/14/2023) 30 tablet 1   No current facility-administered medications for this visit.    Medication reconciliation is based on the patient's most recent medication list in the electronic medical record (EMR) including herbal products and OTC medications.   The patient's medication list was reviewed today with the patient? Yes   Drug-drug interactions (DDIs) DDIs were evaluated? Yes Significant DDIs identified? No   Drug-Food Interactions Drug-food interactions were evaluated? Yes Drug-food interactions identified? No   Follow-up Plan  Treatment start date: 06/20/23 Port placement date: 06/10/23 We reviewed the prescriptions, premedications, and treatment regimen with the patient. Possible side effects of the treatment regimen were reviewed and management strategies were discussed.  Can use loperamide as needed for diarrhea, loratadine as needed for G-CSF bone pain, and Senna-S as needed for  constipation.  Clinical pharmacy will assist Dr. Serena Croissant and Talbert Cage on an as needed basis going forward  [employer] participated in the discussion, expressed understanding, and voiced agreement with the above plan. All questions were answered to her satisfaction. The patient was advised to contact the clinic at [phone] with any questions or concerns prior to her return visit.   I spent 60 minutes assessing the patient.  Elayjah Chaney A. Odetta Pink, PharmD, BCOP, CPP  Anselm Lis, RPH-CPP, 06/14/2023 2:52 PM  **Disclaimer: This note was dictated with voice recognition software. Similar sounding words can inadvertently  be transcribed and this note may contain transcription errors which may not have been corrected upon publication of note.**

## 2023-06-20 ENCOUNTER — Inpatient Hospital Stay: Payer: Medicare HMO

## 2023-06-20 ENCOUNTER — Other Ambulatory Visit: Payer: Self-pay

## 2023-06-20 ENCOUNTER — Inpatient Hospital Stay: Payer: Medicare HMO | Admitting: Hematology and Oncology

## 2023-06-20 VITALS — BP 147/70 | HR 76 | Temp 98.0°F | Resp 18 | Ht 68.0 in

## 2023-06-20 VITALS — BP 146/87 | HR 80 | Temp 98.8°F | Resp 16

## 2023-06-20 DIAGNOSIS — Z170 Estrogen receptor positive status [ER+]: Secondary | ICD-10-CM

## 2023-06-20 DIAGNOSIS — Z5189 Encounter for other specified aftercare: Secondary | ICD-10-CM | POA: Diagnosis not present

## 2023-06-20 DIAGNOSIS — Z9012 Acquired absence of left breast and nipple: Secondary | ICD-10-CM | POA: Diagnosis not present

## 2023-06-20 DIAGNOSIS — C50412 Malignant neoplasm of upper-outer quadrant of left female breast: Secondary | ICD-10-CM

## 2023-06-20 DIAGNOSIS — Z5111 Encounter for antineoplastic chemotherapy: Secondary | ICD-10-CM | POA: Diagnosis not present

## 2023-06-20 DIAGNOSIS — E119 Type 2 diabetes mellitus without complications: Secondary | ICD-10-CM | POA: Diagnosis not present

## 2023-06-20 DIAGNOSIS — Z01812 Encounter for preprocedural laboratory examination: Secondary | ICD-10-CM | POA: Diagnosis not present

## 2023-06-20 LAB — CBC WITH DIFFERENTIAL (CANCER CENTER ONLY)
Abs Immature Granulocytes: 0.02 10*3/uL (ref 0.00–0.07)
Basophils Absolute: 0 10*3/uL (ref 0.0–0.1)
Basophils Relative: 1 %
Eosinophils Absolute: 0.2 10*3/uL (ref 0.0–0.5)
Eosinophils Relative: 4 %
HCT: 31.7 % — ABNORMAL LOW (ref 36.0–46.0)
Hemoglobin: 10.4 g/dL — ABNORMAL LOW (ref 12.0–15.0)
Immature Granulocytes: 0 %
Lymphocytes Relative: 34 %
Lymphs Abs: 1.7 10*3/uL (ref 0.7–4.0)
MCH: 28 pg (ref 26.0–34.0)
MCHC: 32.8 g/dL (ref 30.0–36.0)
MCV: 85.2 fL (ref 80.0–100.0)
Monocytes Absolute: 0.5 10*3/uL (ref 0.1–1.0)
Monocytes Relative: 10 %
Neutro Abs: 2.5 10*3/uL (ref 1.7–7.7)
Neutrophils Relative %: 51 %
Platelet Count: 252 10*3/uL (ref 150–400)
RBC: 3.72 MIL/uL — ABNORMAL LOW (ref 3.87–5.11)
RDW: 13.6 % (ref 11.5–15.5)
WBC Count: 5 10*3/uL (ref 4.0–10.5)
nRBC: 0 % (ref 0.0–0.2)

## 2023-06-20 LAB — CMP (CANCER CENTER ONLY)
ALT: 11 U/L (ref 0–44)
AST: 11 U/L — ABNORMAL LOW (ref 15–41)
Albumin: 3.7 g/dL (ref 3.5–5.0)
Alkaline Phosphatase: 38 U/L (ref 38–126)
Anion gap: 9 (ref 5–15)
BUN: 15 mg/dL (ref 8–23)
CO2: 26 mmol/L (ref 22–32)
Calcium: 9.2 mg/dL (ref 8.9–10.3)
Chloride: 105 mmol/L (ref 98–111)
Creatinine: 1.12 mg/dL — ABNORMAL HIGH (ref 0.44–1.00)
GFR, Estimated: 51 mL/min — ABNORMAL LOW (ref >60)
Glucose, Bld: 203 mg/dL — ABNORMAL HIGH (ref 70–99)
Potassium: 3.5 mmol/L (ref 3.5–5.1)
Sodium: 140 mmol/L (ref 135–145)
Total Bilirubin: 0.3 mg/dL (ref <1.2)
Total Protein: 7.5 g/dL (ref 6.5–8.1)

## 2023-06-20 MED ORDER — DEXAMETHASONE SODIUM PHOSPHATE 10 MG/ML IJ SOLN
10.0000 mg | Freq: Once | INTRAMUSCULAR | Status: AC
  Administered 2023-06-20: 10 mg via INTRAVENOUS
  Filled 2023-06-20: qty 1

## 2023-06-20 MED ORDER — SODIUM CHLORIDE 0.9 % IV SOLN
60.0000 mg/m2 | Freq: Once | INTRAVENOUS | Status: AC
  Administered 2023-06-20: 108 mg via INTRAVENOUS
  Filled 2023-06-20: qty 10.8

## 2023-06-20 MED ORDER — SODIUM CHLORIDE 0.9% FLUSH
10.0000 mL | INTRAVENOUS | Status: DC | PRN
  Administered 2023-06-20: 10 mL

## 2023-06-20 MED ORDER — HEPARIN SOD (PORK) LOCK FLUSH 100 UNIT/ML IV SOLN
500.0000 [IU] | Freq: Once | INTRAVENOUS | Status: AC | PRN
  Administered 2023-06-20: 500 [IU]

## 2023-06-20 MED ORDER — PALONOSETRON HCL INJECTION 0.25 MG/5ML
0.2500 mg | Freq: Once | INTRAVENOUS | Status: AC
  Administered 2023-06-20: 0.25 mg via INTRAVENOUS
  Filled 2023-06-20: qty 5

## 2023-06-20 MED ORDER — SODIUM CHLORIDE 0.9 % IV SOLN
500.0000 mg/m2 | Freq: Once | INTRAVENOUS | Status: AC
  Administered 2023-06-20: 1000 mg via INTRAVENOUS
  Filled 2023-06-20: qty 50

## 2023-06-20 MED ORDER — SODIUM CHLORIDE 0.9 % IV SOLN: INTRAVENOUS | Status: DC

## 2023-06-20 NOTE — Progress Notes (Signed)
Ok to proceed with cytoxan 1000mg  - rounded to vial

## 2023-06-20 NOTE — Assessment & Plan Note (Signed)
Palpable concern left axilla, mammogram showed 0.5 cm mass left breast 1:30 position possibly an intramammary lymph node, at the site of palpable concern there was an abnormal lymph node.  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, left axillary lymph node biopsy positive, no evidence of extranodal extension, ER 100%, PR 99%, Ki-67 50%, HER2 1+ negative, stage IIa    05/06/2023: Left mastectomy: Grade 3 ILC with LCIS, 5 cm, margins negative, lymphovascular base involvement present, 3/4 lymph nodes positive, ER 100%, PR 99%, HER2 negative 1+, Ki-67 50% 06/10/2023: ALND: 0/1 lymph node  Treatment plan: Based upon tumor size greater than 5 cm and presence of 3 positive lymph nodes being grade 3 and Ki-67 of 50% I recommended systemic chemotherapy with Taxotere and Cytoxan every 3 weeks x 4 Adjuvant radiation therapy followed by Adjuvant antiestrogen therapy --------------------------------------------------------------------------------------------------------------------------- Current treatment: Cycle 1 day 1 Taxotere and Cytoxan Labs reviewed, chemo education completed, antiemetics reviewed Return to clinic in 1 week for toxicity check

## 2023-06-20 NOTE — Progress Notes (Signed)
Patient Care Team: Assunta Found, MD as PCP - General (Family Medicine) Lanelle Bal, DO as Consulting Physician (Internal Medicine)  DIAGNOSIS:  Encounter Diagnosis  Name Primary?   Malignant neoplasm of upper-outer quadrant of left breast in female, estrogen receptor positive (HCC) Yes    SUMMARY OF ONCOLOGIC HISTORY: Oncology History  Malignant neoplasm of upper-outer quadrant of left breast in female, estrogen receptor positive (HCC)  03/30/2023 Initial Diagnosis   Palpable concern left axilla, mammogram showed 0.5 cm mass left breast 1:30 position possibly an intramammary lymph node, at the site of palpable concern there was an abnormal lymph node.  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 The recommendation was for a discussion about ALND.  Dr. Pamelia Hoit plans to discuss adj chemo with her also. He is seeing her 11/4.  Looks like she is scheduled for post op appt with Dr. Carolynne Edouard 10/24.    06/06/2023 Cancer Staging   Staging form: Breast, AJCC 8th Edition - Pathologic: Stage IIA (pT2, pN1(sn), cM0, G3, ER+, PR+, HER2-) - Signed by Serena Croissant, MD on 06/06/2023 Stage prefix: Initial diagnosis Method of lymph node assessment: Sentinel lymph node biopsy Histologic grading system: 3 grade system   06/20/2023 -  Chemotherapy   Patient is on Treatment Plan : BREAST TC q21d       CHIEF COMPLIANT: Cycle 1 Taxotere and Cytoxan  HISTORY OF PRESENT ILLNESS:   History of Present Illness   The patient, with a diagnosis of breast cancer,  presents for chemotherapy with TC.  The patient is proactive in managing her health, keeping a diary of her symptoms and experiences. She has arranged for transportation to and from her treatments to maintain her independence and avoid feeling infantilized by well-meaning friends and family. She expresses concern about dietary restrictions during chemotherapy, specifically asking about consuming pie on Thanksgiving and drinking red wine with spaghetti. She also discusses her juicing habits, which include celery and beets.         ALLERGIES:  is allergic to lipitor [atorvastatin].  MEDICATIONS:  Current Outpatient Medications  Medication Sig Dispense Refill   amLODipine (NORVASC) 10 MG tablet Take 10 mg by mouth at bedtime.      dexamethasone (DECADRON) 4 MG tablet Take 1 tablet day before chemo and 1 tablet day after chemo with food (Patient not taking: Reported on 06/14/2023) 8 tablet 0   lidocaine-prilocaine (EMLA) cream Apply to affected area once (Patient not taking: Reported on 06/14/2023) 30 g 3   losartan (COZAAR) 100 MG tablet Take 100 mg by mouth daily.     metFORMIN (GLUCOPHAGE) 1000 MG tablet Take 1,000 mg by mouth 2 (two) times daily with a meal.  0   methocarbamol (ROBAXIN) 500 MG tablet Take 1 tablet (500 mg total) by mouth every 8 (eight) hours as needed for muscle spasms. (Patient not taking: Reported on 06/14/2023) 30 tablet 3   ondansetron (ZOFRAN) 8 MG tablet Take 1 tablet (8 mg total) by mouth every 8 (eight) hours as needed for nausea or vomiting. Start on the  third day after chemotherapy. (Patient not taking: Reported on 06/14/2023) 30 tablet 1   oxyCODONE (OXY IR/ROXICODONE) 5 MG immediate release tablet Take 1 tablet (5 mg total) by mouth every 6 (six) hours as needed for moderate pain (pain score 4-6). (Patient not taking: Reported on 06/14/2023) 15 tablet 0   prochlorperazine (COMPAZINE) 10 MG tablet Take 1 tablet (10 mg total) by mouth every 6 (six) hours as needed for  nausea or vomiting. (Patient not taking: Reported on 06/14/2023) 30 tablet 1   No current facility-administered medications for this visit.    PHYSICAL EXAMINATION: ECOG PERFORMANCE STATUS: 1 - Symptomatic but completely ambulatory  Vitals:   06/20/23 1204  BP: (!) 147/70  Pulse: 76  Resp: 18  Temp: 98 F (36.7 C)  SpO2: 100%   There were no vitals filed for this visit.    LABORATORY DATA:  I have reviewed the data as listed    Latest Ref Rng & Units 06/20/2023   11:27 AM 06/06/2023    1:42 PM 05/04/2023   10:08 AM  CMP  Glucose 70 - 99 mg/dL 664  403  474   BUN 8 - 23 mg/dL 15  15  11    Creatinine 0.44 - 1.00 mg/dL 2.59  5.63  8.75   Sodium 135 - 145 mmol/L 140  141  138   Potassium 3.5 - 5.1 mmol/L 3.5  4.1  4.1   Chloride 98 - 111 mmol/L 105  104  103   CO2 22 - 32 mmol/L 26  22  27    Calcium 8.9 - 10.3 mg/dL 9.2  64.3  9.5   Total Protein 6.5 - 8.1 g/dL 7.5     Total Bilirubin <1.2 mg/dL 0.3     Alkaline Phos 38 - 126 U/L 38     AST 15 - 41 U/L 11     ALT 0 - 44 U/L 11       Lab Results  Component Value Date   WBC 5.0 06/20/2023   HGB 10.4 (L) 06/20/2023   HCT 31.7 (L) 06/20/2023   MCV 85.2 06/20/2023   PLT 252 06/20/2023   NEUTROABS 2.5 06/20/2023    ASSESSMENT & PLAN:  Malignant neoplasm of upper-outer quadrant of left breast in female, estrogen receptor positive (HCC) Palpable concern left axilla, mammogram showed 0.5 cm mass left breast 1:30 position possibly an intramammary lymph node, at the site of palpable concern there was an abnormal lymph node.  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, left axillary lymph node biopsy positive, no evidence of extranodal extension, ER 100%, PR 99%, Ki-67 50%, HER2 1+ negative, stage IIa    05/06/2023: Left mastectomy: Grade 3 ILC with LCIS, 5 cm, margins negative, lymphovascular base involvement present, 3/4 lymph nodes positive, ER 100%, PR 99%, HER2 negative 1+, Ki-67 50% 06/10/2023: ALND: 0/1  lymph node  Treatment plan: Based upon tumor size greater than 5 cm and presence of 3 positive lymph nodes being grade 3 and Ki-67 of 50% I recommended systemic chemotherapy with Taxotere and Cytoxan every 3 weeks x 4 Adjuvant radiation therapy followed by Adjuvant antiestrogen therapy --------------------------------------------------------------------------------------------------------------------------- Current treatment: Cycle 1 day 1 Taxotere and Cytoxan Labs reviewed, chemo education completed, antiemetics reviewed  Anemia Hemoglobin is slightly low at 10.4 (normal is 12). Discussed the importance of consuming iron-rich foods to help improve anemia. -Increase intake of iron-rich foods. -Monitor hemoglobin levels.  Hyperglycemia Recent blood work showed elevated blood sugar levels. Discussed the importance  of maintaining a balanced diet to manage blood sugar levels. -Monitor blood sugar levels. -Maintain a balanced diet.  Renal function Slightly elevated creatinine at 1.1 (normal is 1). Discussed the importance of hydration to support kidney function. -Ensure intake of at least 64 ounces of water daily. -Monitor creatinine levels.  Genetic counseling Discussed the importance of genetic counseling and testing in the context of cancer treatment. -Schedule genetic counseling and testing during one of the upcoming visits.      Return to clinic in 1 week for toxicity check  No orders of the defined types were placed in this encounter.  The patient has a good understanding of the overall plan. she agrees with it. she will call with any problems that may develop before the next visit here. Total time spent: 30 mins including face to face time and time spent for planning, charting and co-ordination of care   Tamsen Meek, MD 06/20/23

## 2023-06-20 NOTE — Patient Instructions (Signed)
[facility] - A DEPT OF [facility]  Discharge Instructions: Thank you for choosing [facility] to provide your oncology and hematology care.   If you have a lab appointment with the Cancer Center, please go directly to the Cancer Center and check in at the registration area.   Wear comfortable clothing and clothing appropriate for easy access to any Portacath or PICC line.   We strive to give you quality time with your provider. You may need to reschedule your appointment if you arrive late (15 or more minutes).  Arriving late affects you and other patients whose appointments are after yours.  Also, if you miss three or more appointments without notifying the office, you may be dismissed from the clinic at the provider's discretion.      For prescription refill requests, have your pharmacy contact our office and allow 72 hours for refills to be completed.    Today you received the following chemotherapy and/or immunotherapy agents: Docetaxel, Cytoxan      To help prevent nausea and vomiting after your treatment, we encourage you to take your nausea medication as directed.  BELOW ARE SYMPTOMS THAT SHOULD BE REPORTED IMMEDIATELY: *FEVER GREATER THAN 100.4 F (38 C) OR HIGHER *CHILLS OR SWEATING *NAUSEA AND VOMITING THAT IS NOT CONTROLLED WITH YOUR NAUSEA MEDICATION *UNUSUAL SHORTNESS OF BREATH *UNUSUAL BRUISING OR BLEEDING *URINARY PROBLEMS (pain or burning when urinating, or frequent urination) *BOWEL PROBLEMS (unusual diarrhea, constipation, pain near the anus) TENDERNESS IN MOUTH AND THROAT WITH OR WITHOUT PRESENCE OF ULCERS (sore throat, sores in mouth, or a toothache) UNUSUAL RASH, SWELLING OR PAIN  UNUSUAL VAGINAL DISCHARGE OR ITCHING   Items with * indicate a potential emergency and should be followed up as soon as possible or go to the Emergency Department if any problems should occur.  Please show the CHEMOTHERAPY ALERT CARD or  IMMUNOTHERAPY ALERT CARD at check-in to the Emergency Department and triage nurse.  Should you have questions after your visit or need to cancel or reschedule your appointment, please contact [facility] - A DEPT OF [facility]  Dept: [phone]  and follow the prompts.  Office hours are 8:00 a.m. to 4:30 p.m. Monday - Friday. Please note that voicemails left after 4:00 p.m. may not be returned until the following business day.  We are closed weekends and major holidays. You have access to a nurse at all times for urgent questions. Please call the main number to the clinic Dept: [phone] and follow the prompts.   For any non-urgent questions, you may also contact your provider using MyChart. We now offer e-Visits for anyone 75 and older to request care online for non-urgent symptoms. For details visit [email or web address].   Also download the MyChart app! Go to the app store, search "MyChart", open the app, select [place], and log in with your MyChart username and password.

## 2023-06-22 ENCOUNTER — Other Ambulatory Visit: Payer: Self-pay

## 2023-06-22 ENCOUNTER — Inpatient Hospital Stay: Payer: Medicare HMO

## 2023-06-22 VITALS — BP 130/79 | HR 77 | Temp 98.8°F | Resp 16

## 2023-06-22 DIAGNOSIS — D472 Monoclonal gammopathy: Secondary | ICD-10-CM | POA: Diagnosis not present

## 2023-06-22 DIAGNOSIS — C50412 Malignant neoplasm of upper-outer quadrant of left female breast: Secondary | ICD-10-CM | POA: Diagnosis not present

## 2023-06-22 DIAGNOSIS — R509 Fever, unspecified: Secondary | ICD-10-CM | POA: Diagnosis present

## 2023-06-22 DIAGNOSIS — R58 Hemorrhage, not elsewhere classified: Secondary | ICD-10-CM | POA: Diagnosis not present

## 2023-06-22 DIAGNOSIS — Z888 Allergy status to other drugs, medicaments and biological substances status: Secondary | ICD-10-CM | POA: Diagnosis not present

## 2023-06-22 DIAGNOSIS — D62 Acute posthemorrhagic anemia: Secondary | ICD-10-CM | POA: Diagnosis not present

## 2023-06-22 DIAGNOSIS — Z807 Family history of other malignant neoplasms of lymphoid, hematopoietic and related tissues: Secondary | ICD-10-CM | POA: Diagnosis not present

## 2023-06-22 DIAGNOSIS — R6889 Other general symptoms and signs: Secondary | ICD-10-CM | POA: Diagnosis not present

## 2023-06-22 DIAGNOSIS — Z9012 Acquired absence of left breast and nipple: Secondary | ICD-10-CM | POA: Diagnosis not present

## 2023-06-22 DIAGNOSIS — R55 Syncope and collapse: Secondary | ICD-10-CM | POA: Diagnosis not present

## 2023-06-22 DIAGNOSIS — Z90710 Acquired absence of both cervix and uterus: Secondary | ICD-10-CM | POA: Diagnosis not present

## 2023-06-22 DIAGNOSIS — Z853 Personal history of malignant neoplasm of breast: Secondary | ICD-10-CM | POA: Diagnosis not present

## 2023-06-22 DIAGNOSIS — K625 Hemorrhage of anus and rectum: Secondary | ICD-10-CM | POA: Diagnosis not present

## 2023-06-22 DIAGNOSIS — R571 Hypovolemic shock: Secondary | ICD-10-CM | POA: Diagnosis not present

## 2023-06-22 DIAGNOSIS — K5791 Diverticulosis of intestine, part unspecified, without perforation or abscess with bleeding: Secondary | ICD-10-CM | POA: Diagnosis not present

## 2023-06-22 DIAGNOSIS — R079 Chest pain, unspecified: Secondary | ICD-10-CM | POA: Diagnosis not present

## 2023-06-22 DIAGNOSIS — Z8041 Family history of malignant neoplasm of ovary: Secondary | ICD-10-CM | POA: Diagnosis not present

## 2023-06-22 DIAGNOSIS — E119 Type 2 diabetes mellitus without complications: Secondary | ICD-10-CM | POA: Diagnosis not present

## 2023-06-22 DIAGNOSIS — Z87891 Personal history of nicotine dependence: Secondary | ICD-10-CM | POA: Diagnosis not present

## 2023-06-22 DIAGNOSIS — Z170 Estrogen receptor positive status [ER+]: Secondary | ICD-10-CM

## 2023-06-22 DIAGNOSIS — K760 Fatty (change of) liver, not elsewhere classified: Secondary | ICD-10-CM | POA: Diagnosis not present

## 2023-06-22 DIAGNOSIS — K922 Gastrointestinal hemorrhage, unspecified: Secondary | ICD-10-CM | POA: Diagnosis not present

## 2023-06-22 DIAGNOSIS — Z7984 Long term (current) use of oral hypoglycemic drugs: Secondary | ICD-10-CM | POA: Diagnosis not present

## 2023-06-22 DIAGNOSIS — E876 Hypokalemia: Secondary | ICD-10-CM | POA: Diagnosis not present

## 2023-06-22 DIAGNOSIS — Z923 Personal history of irradiation: Secondary | ICD-10-CM | POA: Diagnosis not present

## 2023-06-22 DIAGNOSIS — I959 Hypotension, unspecified: Secondary | ICD-10-CM | POA: Diagnosis not present

## 2023-06-22 DIAGNOSIS — T458X5A Adverse effect of other primarily systemic and hematological agents, initial encounter: Secondary | ICD-10-CM | POA: Diagnosis present

## 2023-06-22 DIAGNOSIS — Z79899 Other long term (current) drug therapy: Secondary | ICD-10-CM | POA: Diagnosis not present

## 2023-06-22 DIAGNOSIS — K5731 Diverticulosis of large intestine without perforation or abscess with bleeding: Secondary | ICD-10-CM | POA: Diagnosis not present

## 2023-06-22 DIAGNOSIS — I10 Essential (primary) hypertension: Secondary | ICD-10-CM | POA: Diagnosis not present

## 2023-06-22 DIAGNOSIS — Z9221 Personal history of antineoplastic chemotherapy: Secondary | ICD-10-CM | POA: Diagnosis not present

## 2023-06-22 DIAGNOSIS — D72829 Elevated white blood cell count, unspecified: Secondary | ICD-10-CM | POA: Diagnosis not present

## 2023-06-22 MED ORDER — PEGFILGRASTIM-JMDB 6 MG/0.6ML ~~LOC~~ SOSY
6.0000 mg | PREFILLED_SYRINGE | Freq: Once | SUBCUTANEOUS | Status: AC
  Administered 2023-06-22: 6 mg via SUBCUTANEOUS
  Filled 2023-06-22: qty 0.6

## 2023-06-22 NOTE — Patient Instructions (Signed)

## 2023-06-23 ENCOUNTER — Telehealth: Payer: Self-pay | Admitting: Hematology and Oncology

## 2023-06-23 NOTE — Telephone Encounter (Signed)
Patient is aware of scheduled appointment times/dates; patient has also declined wanting to scheduled Genetic Counselor appointment wanting to wait until further discussion with provider and family before making that decision

## 2023-06-24 ENCOUNTER — Inpatient Hospital Stay (HOSPITAL_COMMUNITY): Admit: 2023-06-24 | Payer: Medicare HMO | Admitting: Orthopedic Surgery

## 2023-06-24 ENCOUNTER — Encounter (HOSPITAL_COMMUNITY): Payer: Self-pay

## 2023-06-24 DIAGNOSIS — K922 Gastrointestinal hemorrhage, unspecified: Secondary | ICD-10-CM | POA: Diagnosis not present

## 2023-06-24 DIAGNOSIS — I959 Hypotension, unspecified: Secondary | ICD-10-CM | POA: Diagnosis not present

## 2023-06-24 DIAGNOSIS — R571 Hypovolemic shock: Secondary | ICD-10-CM | POA: Diagnosis not present

## 2023-06-24 DIAGNOSIS — R58 Hemorrhage, not elsewhere classified: Secondary | ICD-10-CM | POA: Diagnosis not present

## 2023-06-24 DIAGNOSIS — R079 Chest pain, unspecified: Secondary | ICD-10-CM | POA: Diagnosis not present

## 2023-06-24 DIAGNOSIS — R55 Syncope and collapse: Secondary | ICD-10-CM | POA: Diagnosis not present

## 2023-06-25 ENCOUNTER — Inpatient Hospital Stay (HOSPITAL_COMMUNITY)
Admission: EM | Admit: 2023-06-25 | Discharge: 2023-06-28 | DRG: 378 | Disposition: A | Discharge status: Home / Self Care | Payer: Medicare HMO | Source: Other Acute Inpatient Hospital | Attending: Internal Medicine | Admitting: Internal Medicine

## 2023-06-25 ENCOUNTER — Encounter (HOSPITAL_COMMUNITY): Payer: Self-pay | Admitting: Pulmonary Disease

## 2023-06-25 ENCOUNTER — Other Ambulatory Visit: Payer: Self-pay

## 2023-06-25 DIAGNOSIS — E119 Type 2 diabetes mellitus without complications: Secondary | ICD-10-CM | POA: Diagnosis present

## 2023-06-25 DIAGNOSIS — I959 Hypotension, unspecified: Secondary | ICD-10-CM | POA: Diagnosis present

## 2023-06-25 DIAGNOSIS — Z7984 Long term (current) use of oral hypoglycemic drugs: Secondary | ICD-10-CM

## 2023-06-25 DIAGNOSIS — I10 Essential (primary) hypertension: Secondary | ICD-10-CM | POA: Diagnosis present

## 2023-06-25 DIAGNOSIS — Z9221 Personal history of antineoplastic chemotherapy: Secondary | ICD-10-CM | POA: Diagnosis not present

## 2023-06-25 DIAGNOSIS — Z90710 Acquired absence of both cervix and uterus: Secondary | ICD-10-CM

## 2023-06-25 DIAGNOSIS — D472 Monoclonal gammopathy: Secondary | ICD-10-CM | POA: Diagnosis present

## 2023-06-25 DIAGNOSIS — D62 Acute posthemorrhagic anemia: Secondary | ICD-10-CM | POA: Diagnosis present

## 2023-06-25 DIAGNOSIS — T458X5A Adverse effect of other primarily systemic and hematological agents, initial encounter: Secondary | ICD-10-CM | POA: Diagnosis present

## 2023-06-25 DIAGNOSIS — C50412 Malignant neoplasm of upper-outer quadrant of left female breast: Secondary | ICD-10-CM | POA: Diagnosis not present

## 2023-06-25 DIAGNOSIS — K922 Gastrointestinal hemorrhage, unspecified: Secondary | ICD-10-CM | POA: Diagnosis present

## 2023-06-25 DIAGNOSIS — Z923 Personal history of irradiation: Secondary | ICD-10-CM

## 2023-06-25 DIAGNOSIS — Z87891 Personal history of nicotine dependence: Secondary | ICD-10-CM

## 2023-06-25 DIAGNOSIS — K760 Fatty (change of) liver, not elsewhere classified: Secondary | ICD-10-CM | POA: Diagnosis present

## 2023-06-25 DIAGNOSIS — Z79899 Other long term (current) drug therapy: Secondary | ICD-10-CM

## 2023-06-25 DIAGNOSIS — Z853 Personal history of malignant neoplasm of breast: Secondary | ICD-10-CM

## 2023-06-25 DIAGNOSIS — K5731 Diverticulosis of large intestine without perforation or abscess with bleeding: Principal | ICD-10-CM | POA: Diagnosis present

## 2023-06-25 DIAGNOSIS — Z888 Allergy status to other drugs, medicaments and biological substances status: Secondary | ICD-10-CM

## 2023-06-25 DIAGNOSIS — K625 Hemorrhage of anus and rectum: Secondary | ICD-10-CM | POA: Diagnosis not present

## 2023-06-25 DIAGNOSIS — D72829 Elevated white blood cell count, unspecified: Secondary | ICD-10-CM | POA: Diagnosis present

## 2023-06-25 DIAGNOSIS — Z9012 Acquired absence of left breast and nipple: Secondary | ICD-10-CM

## 2023-06-25 DIAGNOSIS — Z807 Family history of other malignant neoplasms of lymphoid, hematopoietic and related tissues: Secondary | ICD-10-CM

## 2023-06-25 DIAGNOSIS — R509 Fever, unspecified: Secondary | ICD-10-CM | POA: Diagnosis present

## 2023-06-25 DIAGNOSIS — E876 Hypokalemia: Secondary | ICD-10-CM | POA: Diagnosis not present

## 2023-06-25 DIAGNOSIS — K5791 Diverticulosis of intestine, part unspecified, without perforation or abscess with bleeding: Secondary | ICD-10-CM | POA: Diagnosis not present

## 2023-06-25 DIAGNOSIS — Z8041 Family history of malignant neoplasm of ovary: Secondary | ICD-10-CM | POA: Diagnosis not present

## 2023-06-25 LAB — CBC
HCT: 31.6 % — ABNORMAL LOW (ref 36.0–46.0)
HCT: 28.7 % — ABNORMAL LOW (ref 36.0–46.0)
Hemoglobin: 10.3 g/dL — ABNORMAL LOW (ref 12.0–15.0)
Hemoglobin: 9.3 g/dL — ABNORMAL LOW (ref 12.0–15.0)
MCH: 27.8 pg (ref 26.0–34.0)
MCH: 28.1 pg (ref 26.0–34.0)
MCHC: 32.6 g/dL (ref 30.0–36.0)
MCHC: 32.4 g/dL (ref 30.0–36.0)
MCV: 85.4 fL (ref 80.0–100.0)
MCV: 86.7 fL (ref 80.0–100.0)
Platelets: 133 10*3/uL — ABNORMAL LOW (ref 150–400)
Platelets: 133 10*3/uL — ABNORMAL LOW (ref 150–400)
RBC: 3.7 MIL/uL — ABNORMAL LOW (ref 3.87–5.11)
RBC: 3.31 MIL/uL — ABNORMAL LOW (ref 3.87–5.11)
RDW: 14.3 % (ref 11.5–15.5)
RDW: 14.1 % (ref 11.5–15.5)
WBC: 6.9 10*3/uL (ref 4.0–10.5)
WBC: 4 10*3/uL (ref 4.0–10.5)
nRBC: 0 % (ref 0.0–0.2)
nRBC: 0 % (ref 0.0–0.2)

## 2023-06-25 LAB — COMPREHENSIVE METABOLIC PANEL
ALT: 14 U/L (ref 0–44)
AST: 12 U/L — ABNORMAL LOW (ref 15–41)
Albumin: 2.8 g/dL — ABNORMAL LOW (ref 3.5–5.0)
Alkaline Phosphatase: 35 U/L — ABNORMAL LOW (ref 38–126)
Anion gap: 6 (ref 5–15)
BUN: 17 mg/dL (ref 8–23)
CO2: 24 mmol/L (ref 22–32)
Calcium: 8 mg/dL — ABNORMAL LOW (ref 8.9–10.3)
Chloride: 107 mmol/L (ref 98–111)
Creatinine, Ser: 0.9 mg/dL (ref 0.44–1.00)
GFR, Estimated: >60 mL/min (ref >60)
Glucose, Bld: 101 mg/dL — ABNORMAL HIGH (ref 70–99)
Potassium: 3.3 mmol/L — ABNORMAL LOW (ref 3.5–5.1)
Sodium: 137 mmol/L (ref 135–145)
Total Bilirubin: 1.3 mg/dL — ABNORMAL HIGH (ref <1.2)
Total Protein: 6 g/dL — ABNORMAL LOW (ref 6.5–8.1)

## 2023-06-25 LAB — HEMOGLOBIN AND HEMATOCRIT, BLOOD
HCT: 29.2 % — ABNORMAL LOW (ref 36.0–46.0)
Hemoglobin: 9.6 g/dL — ABNORMAL LOW (ref 12.0–15.0)

## 2023-06-25 LAB — MRSA NEXT GEN BY PCR, NASAL: MRSA by PCR Next Gen: NOT DETECTED

## 2023-06-25 LAB — PROTIME-INR
INR: 1.2 (ref 0.8–1.2)
Prothrombin Time: 14.9 s (ref 11.4–15.2)

## 2023-06-25 LAB — LACTIC ACID, PLASMA: Lactic Acid, Venous: 1 mmol/L (ref 0.5–1.9)

## 2023-06-25 MED ORDER — POLYETHYLENE GLYCOL 3350 17 G PO PACK
17.0000 g | PACK | Freq: Every day | ORAL | Status: DC | PRN
Start: 2023-06-25 — End: 2023-06-28

## 2023-06-25 MED ORDER — OXYCODONE HCL 5 MG PO TABS
5.0000 mg | ORAL_TABLET | Freq: Four times a day (QID) | ORAL | Status: DC | PRN
  Administered 2023-06-26: 5 mg via ORAL
  Filled 2023-06-25: qty 1

## 2023-06-25 MED ORDER — SODIUM CHLORIDE 0.9 % IV SOLN: INTRAVENOUS | Status: AC

## 2023-06-25 MED ORDER — LOSARTAN POTASSIUM 50 MG PO TABS
100.0000 mg | ORAL_TABLET | Freq: Every day | ORAL | Status: DC
  Administered 2023-06-25 – 2023-06-28 (×4): 100 mg via ORAL
  Filled 2023-06-25 (×4): qty 2

## 2023-06-25 MED ORDER — METHOCARBAMOL 500 MG PO TABS: 500.0000 mg | ORAL_TABLET | Freq: Three times a day (TID) | ORAL | Status: DC | PRN

## 2023-06-25 MED ORDER — CHLORHEXIDINE GLUCONATE CLOTH 2 % EX PADS
6.0000 | MEDICATED_PAD | Freq: Every day | CUTANEOUS | Status: DC
  Administered 2023-06-25 – 2023-06-27 (×4): 6 via TOPICAL

## 2023-06-25 MED ORDER — PANTOPRAZOLE SODIUM 40 MG IV SOLR
40.0000 mg | INTRAVENOUS | Status: DC
  Administered 2023-06-25 – 2023-06-27 (×4): 40 mg via INTRAVENOUS
  Filled 2023-06-25 (×4): qty 10

## 2023-06-25 MED ORDER — AMLODIPINE BESYLATE 10 MG PO TABS
10.0000 mg | ORAL_TABLET | Freq: Every day | ORAL | Status: DC
  Administered 2023-06-25 – 2023-06-27 (×3): 10 mg via ORAL
  Filled 2023-06-25 (×3): qty 1

## 2023-06-25 MED ORDER — ORAL CARE MOUTH RINSE: 15.0000 mL | OROMUCOSAL | Status: DC | PRN

## 2023-06-25 MED ORDER — DOCUSATE SODIUM 100 MG PO CAPS: 100.0000 mg | ORAL_CAPSULE | Freq: Two times a day (BID) | ORAL | Status: DC | PRN

## 2023-06-25 NOTE — H&P (Signed)
NAME:  Melanie Welch, MRN:  [id number], DOB:  07/23/1946, LOS: 0 ADMISSION DATE:  06/25/2023, CONSULTATION DATE:  06/26/2023 REFERRING MD:  ED Sovah [place], CHIEF COMPLAINT:  Blood per rectum    History of Present Illness:  77 year old woman who suddenly started passing blood per rectum yesterday. Initially associated with lower abdominal cramping which has subsided to a dull persistent ache. She reports a steady stream of bloody motions.   Initially hypotensive to the 60's at [facility], she has improved after 2 units PRBC and has been hypertensive since.  CTA there reportedly negative.   She apparently had a similar episode 3 years ago and has known diverticular disease.   On ROS she has recently undergone L mastectomy and is currently receiving adjuvant chemotherapy for stage III breast cancer. She has been tolerating chemotherapy well without nausea. She denies dyspnea, fever. Appetite has been good. No dizziness, weakness or sensory changes. Some tenderness at mastectomy site but no drainage.   Pertinent  Medical History    Past Medical History:  Diagnosis Date   Breast cancer (HCC) 03/30/2023   Colonic adenoma 12/2018   ONE   Diabetes mellitus    Fatty liver    Hypertension    MGUS (monoclonal gammopathy of unknown significance)    Proteinuria    Past Surgical History:  Procedure Laterality Date   ABDOMINAL HYSTERECTOMY     AXILLARY LYMPH NODE DISSECTION Left 06/10/2023   Procedure: COMPLETION LEFT AXILLARY LYMPH NODE DISSECTION;  Surgeon: Griselda Miner, MD;  Location: [facility];  Service: General;  Laterality: Left;   BREAST BIOPSY  2005   left   BREAST BIOPSY Left 03/30/2023   [place] LT BREAST BX W LOC DEV EA ADD LESION IMG BX SPEC US GUIDE 03/30/2023 [facility]   BREAST BIOPSY Left 03/30/2023   MM LT BREAST BX W LOC DEV 1ST LESION IMAGE BX SPEC STEREO GUIDE 03/30/2023 [facility]   BREAST BIOPSY Left 03/30/2023   [place] LT BREAST BX W LOC DEV 1ST  LESION IMG BX SPEC US GUIDE 03/30/2023 [facility]   BREAST BIOPSY Left 05/05/2023   [place] LT RADIOACTIVE SEED LOC 05/05/2023 [facility]   CATARACT EXTRACTION W/PHACO Right 05/10/2017   Procedure: CATARACT EXTRACTION PHACO AND INTRAOCULAR LENS PLACEMENT (IOC);  Surgeon: Jethro Bolus, MD;  Location: [facility];  Service: Ophthalmology;  Laterality: Right;  CDE: 5.04   CATARACT EXTRACTION W/PHACO Left 05/24/2017   Procedure: CATARACT EXTRACTION PHACO AND INTRAOCULAR LENS PLACEMENT (IOC);  Surgeon: Jethro Bolus, MD;  Location: [facility];  Service: Ophthalmology;  Laterality: Left;  CDE: 8.40   COLONOSCOPY  07/13/2011   Procedure: COLONOSCOPY;  Surgeon: Arlyce Harman, MD;  [facility];  Service: Endoscopy;  Laterality: N/A;  9:30 AM   COLONOSCOPY N/A 12/09/2018   diverticulosis, suspect diverticulum in mid sigmoid s/p epi, unable to place clip. 5 mm benign polyp. Colonic lipoma. Normal TI. Many diverticula occluded with stool.    NODE DISSECTION Left 05/06/2023   Procedure: TARGETED NODE DISSECTION;  Surgeon: Griselda Miner, MD;  Location: [facility] OR;  Service: General;  Laterality: Left;   POLYPECTOMY  12/09/2018   Procedure: POLYPECTOMY;  Surgeon: Corbin Ade, MD;  [facility];  Service: Endoscopy;;  splenic flexure   PORTACATH PLACEMENT N/A 06/10/2023   Procedure: INSERTION PORT-A-CATH WITH ULTRASOUND GUIDANCE;  Surgeon: Griselda Miner, MD;  Location: [facility];  Service: General;  Laterality: N/A;   SIMPLE  MASTECTOMY WITH AXILLARY SENTINEL NODE BIOPSY Left 05/06/2023   Procedure: LEFT MASTECTOMY AND SENTINEL NODE BIOPSY;  Surgeon: Griselda Miner, MD;  Location: [facility] OR;  Service: General;  Laterality: Left;   No current facility-administered medications on file prior to encounter.   Current Outpatient Medications on File Prior to Encounter  Medication Sig Dispense Refill   amLODipine (NORVASC) 10 MG tablet Take 10 mg by mouth at bedtime.      dexamethasone  (DECADRON) 4 MG tablet Take 1 tablet day before chemo and 1 tablet day after chemo with food (Patient not taking: Reported on 06/14/2023) 8 tablet 0   lidocaine-prilocaine (EMLA) cream Apply to affected area once (Patient not taking: Reported on 06/14/2023) 30 g 3   losartan (COZAAR) 100 MG tablet Take 100 mg by mouth daily.     metFORMIN (GLUCOPHAGE) 1000 MG tablet Take 1,000 mg by mouth 2 (two) times daily with a meal.  0   methocarbamol (ROBAXIN) 500 MG tablet Take 1 tablet (500 mg total) by mouth every 8 (eight) hours as needed for muscle spasms. (Patient not taking: Reported on 06/14/2023) 30 tablet 3   ondansetron (ZOFRAN) 8 MG tablet Take 1 tablet (8 mg total) by mouth every 8 (eight) hours as needed for nausea or vomiting. Start on the third day after chemotherapy. (Patient not taking: Reported on 06/14/2023) 30 tablet 1   oxyCODONE (OXY IR/ROXICODONE) 5 MG immediate release tablet Take 1 tablet (5 mg total) by mouth every 6 (six) hours as needed for moderate pain (pain score 4-6). (Patient not taking: Reported on 06/14/2023) 15 tablet 0   prochlorperazine (COMPAZINE) 10 MG tablet Take 1 tablet (10 mg total) by mouth every 6 (six) hours as needed for nausea or vomiting. (Patient not taking: Reported on 06/14/2023) 30 tablet 1   Significant Hospital Events: Including procedures, antibiotic start and stop dates in addition to other pertinent events   11/24 - admission.   Interim History / Subjective:  Continues to have bloody bowel movements.   Objective   Blood pressure (!) 148/81, pulse 76, temperature 98 F (36.7 C), temperature source Oral, resp. rate 19, height 5\' 8"  (1.727 m), weight 66.6 kg, SpO2 99%.        Intake/Output Summary (Last 24 hours) at 06/25/2023 0714 Last data filed at 06/25/2023 0500 Gross per 24 hour  Intake 57.92 ml  Output --  Net 57.92 ml   Filed Weights   06/25/23 0300  Weight: 66.6 kg    Examination: General: appears stated age with no  distress. HENT: no icterus, oral mucosa moist with normal tongue, no glossitis. No nodes.  Lungs: clear bilaterally. Well healed left mastectomy scar  Cardiovascular: no JVD, HS normal, normal capillary refill. Abdomen: active bowel sounds. No masses. No tenderness to palpation.  Extremities: no edema or rashes Neuro: intact GU: deferred.   Ancillary Tests:  HB stable at 10.3 (received 2units) Mild thrombocytopenia.  LFT's unremarkable.  Hypokalemia 3.3 Assessment & Plan:   Lower GI bleed secondary to diverticulosis Stage III breast cancer on treatment. HTN Type 2 DM  Plan:  - GI consultation today.  - Active bleeding stopped based on CTA. May be passing left over blood. - Repeat CBC as needed and transfuse if HB < 8 - Continue home antihypertensives -SSI for glycemic control.  Best Practice (right click and "Reselect all SmartList Selections" daily)   Diet/type: NPO w/ oral meds DVT prophylaxis: SCDs Start: 06/25/23 0257   Pressure ulcer(s): not present on admission  GI prophylaxis: PPI Lines: N/A Foley:  N/A Code Status:  full code Last date of multidisciplinary goals of care discussion [patient updated.]  Lynnell Catalan, MD [facility] ICU Physician [facility]  Pager: [phone] Or Epic Secure Chat After hours: [phone].  06/25/2023, 7:14 AM

## 2023-06-25 NOTE — Plan of Care (Signed)
  Problem: Health Behavior/Discharge Planning: Goal: Ability to manage health-related needs will improve Outcome: Progressing   Problem: Clinical Measurements: Goal: Respiratory complications will improve Outcome: Progressing Goal: Cardiovascular complication will be avoided Outcome: Progressing   Problem: Activity: Goal: Risk for activity intolerance will decrease Outcome: Progressing   Problem: Elimination: Goal: Will not experience complications related to urinary retention Outcome: Progressing   Problem: Safety: Goal: Ability to remain free from injury will improve Outcome: Progressing   Problem: Skin Integrity: Goal: Risk for impaired skin integrity will decrease Outcome: Progressing

## 2023-06-25 NOTE — Consult Note (Signed)
Referring Provider: CCM Primary Care Physician:  Assunta Found, MD Primary Gastroenterologist: [facility]  Reason for Consultation: GI bleed  HPI: Melanie Welch is a 77 y.o. female with recent diagnosis of left breast cancer was started on chemotherapy few days ago, history of MGUS, history of diverticular bleed in May 2020 transferred from outside hospital for further management of lower GI bleed.  Was initially found to be hypotensive at [facility].  CT angio negative per the chart review of notes at [facility].  She received 2 units of blood transfusion.  GI is consulted for further evaluation.  Patient seen and examined at bedside.  She started having bright red blood per rectum yesterday.  Complaining of mild left lower quadrant discomfort.  Last episode of rectal bleeding this morning.  He denies any vomiting.  Colonoscopy in May 2020 for evaluation of rectal bleeding showed suspected diverticular bleed from mid sigmoid colon which was treated with epinephrine injection.  Also had a small tubular adenoma at splenic flexure.  Repeat colonoscopy was not recommended because of age.  Past Medical History:  Diagnosis Date   Breast cancer (HCC) 03/30/2023   Colonic adenoma 12/2018   ONE   Diabetes mellitus    Fatty liver    Hypertension    MGUS (monoclonal gammopathy of unknown significance)    Proteinuria     Past Surgical History:  Procedure Laterality Date   ABDOMINAL HYSTERECTOMY     AXILLARY LYMPH NODE DISSECTION Left 06/10/2023   Procedure: COMPLETION LEFT AXILLARY LYMPH NODE DISSECTION;  Surgeon: Griselda Miner, MD;  Location: [facility];  Service: General;  Laterality: Left;   BREAST BIOPSY  2005   left   BREAST BIOPSY Left 03/30/2023   [place] LT BREAST BX W LOC DEV EA ADD LESION IMG BX SPEC US GUIDE 03/30/2023 [facility]   BREAST BIOPSY Left 03/30/2023   MM LT BREAST BX W LOC DEV 1ST LESION IMAGE BX SPEC STEREO GUIDE 03/30/2023 [facility]    BREAST BIOPSY Left 03/30/2023   [place] LT BREAST BX W LOC DEV 1ST LESION IMG BX SPEC US GUIDE 03/30/2023 [facility]   BREAST BIOPSY Left 05/05/2023   [place] LT RADIOACTIVE SEED LOC 05/05/2023 [facility]   CATARACT EXTRACTION W/PHACO Right 05/10/2017   Procedure: CATARACT EXTRACTION PHACO AND INTRAOCULAR LENS PLACEMENT (IOC);  Surgeon: Jethro Bolus, MD;  Location: [facility];  Service: Ophthalmology;  Laterality: Right;  CDE: 5.04   CATARACT EXTRACTION W/PHACO Left 05/24/2017   Procedure: CATARACT EXTRACTION PHACO AND INTRAOCULAR LENS PLACEMENT (IOC);  Surgeon: Jethro Bolus, MD;  Location: [facility];  Service: Ophthalmology;  Laterality: Left;  CDE: 8.40   COLONOSCOPY  07/13/2011   Procedure: COLONOSCOPY;  Surgeon: Arlyce Harman, MD;  [facility];  Service: Endoscopy;  Laterality: N/A;  9:30 AM   COLONOSCOPY N/A 12/09/2018   diverticulosis, suspect diverticulum in mid sigmoid s/p epi, unable to place clip. 5 mm benign polyp. Colonic lipoma. Normal TI. Many diverticula occluded with stool.    NODE DISSECTION Left 05/06/2023   Procedure: TARGETED NODE DISSECTION;  Surgeon: Griselda Miner, MD;  Location: [facility] OR;  Service: General;  Laterality: Left;   POLYPECTOMY  12/09/2018   Procedure: POLYPECTOMY;  Surgeon: Corbin Ade, MD;  [facility];  Service: Endoscopy;;  splenic flexure   PORTACATH PLACEMENT N/A 06/10/2023   Procedure: INSERTION PORT-A-CATH WITH ULTRASOUND GUIDANCE;  Surgeon: Griselda Miner, MD;  Location: [facility];  Service:  General;  Laterality: N/A;   SIMPLE MASTECTOMY WITH AXILLARY SENTINEL NODE BIOPSY Left 05/06/2023   Procedure: LEFT MASTECTOMY AND SENTINEL NODE BIOPSY;  Surgeon: Griselda Miner, MD;  Location: [facility];  Service: General;  Laterality: Left;    Prior to Admission medications   Medication Sig Start Date End Date Taking? Authorizing Provider  amLODipine (NORVASC) 10 MG tablet Take 10 mg by mouth at bedtime.    Yes [provider]  dexamethasone (DECADRON) 4 MG tablet Take 1 tablet day before chemo and 1 tablet day after chemo with food 06/06/23  Yes Serena Croissant, MD  gabapentin (NEURONTIN) 100 MG capsule Take 100 mg by mouth as needed (nerve pain).   Yes [provider]  lidocaine-prilocaine (EMLA) cream Apply to affected area once Patient taking differently: Apply 1 Application topically as needed (port). 06/06/23  Yes Serena Croissant, MD  losartan (COZAAR) 100 MG tablet Take 100 mg by mouth daily.   Yes [provider]  metFORMIN (GLUCOPHAGE) 1000 MG tablet Take 1,000 mg by mouth 2 (two) times daily with a meal. 03/11/17  Yes [provider]  methocarbamol (ROBAXIN) 500 MG tablet Take 1 tablet (500 mg total) by mouth every 8 (eight) hours as needed for muscle spasms. Patient taking differently: Take 500 mg by mouth as needed for muscle spasms. 06/10/23  Yes [name], MD  ondansetron (ZOFRAN) 8 MG tablet Take 1 tablet (8 mg total) by mouth every 8 (eight) hours as needed for nausea or vomiting. Start on the third day after chemotherapy. 06/06/23  Yes Serena Croissant, MD  oxyCODONE (OXY IR/ROXICODONE) 5 MG immediate release tablet Take 1 tablet (5 mg total) by mouth every 6 (six) hours as needed for moderate pain (pain score 4-6). 06/11/23  Yes [name], MD  prochlorperazine (COMPAZINE) 10 MG tablet Take 1 tablet (10 mg total) by mouth every 6 (six) hours as needed for nausea or vomiting. 06/06/23  Yes Serena Croissant, MD    Scheduled Meds:  amLODipine  10 mg Oral QHS   Chlorhexidine Gluconate Cloth  6 each Topical Daily   losartan  100 mg Oral Daily   pantoprazole (PROTONIX) IV  40 mg Intravenous Q24H   Continuous Infusions:  sodium chloride 40 mL/hr at 06/25/23 0800   PRN Meds:.docusate sodium, methocarbamol, mouth rinse, oxyCODONE, polyethylene glycol  Allergies as of 06/25/2023 - Review Complete 06/25/2023  Allergen Reaction Noted   Lipitor [atorvastatin] Nausea And Vomiting  07/18/2019    Family History  Problem Relation Age of Onset   Ovarian cancer Mother    Multiple myeloma Sister    Colon cancer Neg Hx     Social History   Socioeconomic History   Marital status: Divorced    Spouse name: Not on file   Number of children: Not on file   Years of education: Not on file   Highest education level: Not on file  Occupational History   Not on file  Tobacco Use   Smoking status: Former   Smokeless tobacco: Never  Vaping Use   Vaping status: Never Used  Substance and Sexual Activity   Alcohol use: Yes    Comment: rare wine   Drug use: No   Sexual activity: Not Currently  Other Topics Concern   Not on file  Social History Narrative   Not on file   Social Determinants of Health   Financial Resource Strain: Low Risk  (04/29/2021)   Received from [facility], [facility]  Overall Financial Resource Strain (CARDIA)    Difficulty of Paying Living Expenses: Not hard at all  Food Insecurity: No Food Insecurity (06/25/2023)   Hunger Vital Sign    Worried About Running Out of Food in the Last Year: Never true    Ran Out of Food in the Last Year: Never true  Recent Concern: Food Insecurity - Food Insecurity Present (04/12/2023)   Hunger Vital Sign    Worried About Running Out of Food in the Last Year: Sometimes true    Ran Out of Food in the Last Year: Sometimes true  Transportation Needs: No Transportation Needs (06/25/2023)   PRAPARE - Administrator, Civil Service (Medical): No    Lack of Transportation (Non-Medical): No  Physical Activity: Inactive (09/23/2021)   [facility] from [facility], [facility]   Exercise Vital Sign    Days of Exercise per Week: 0 days    Minutes of Exercise per Session: 0 min  Stress: No Stress Concern Present (04/29/2021)   Received from [facility], [facility] of Occupational Health - Occupational Stress Questionnaire    Feeling of Stress : Not at all  Social  Connections: Unknown (12/15/2021)   Received from [facility], [facility]   Social Network    Social Network: Not on file  Intimate Partner Violence: Not At Risk (06/25/2023)   Humiliation, Afraid, Rape, and Kick questionnaire    Fear of Current or Ex-Partner: No    Emotionally Abused: No    Physically Abused: No    Sexually Abused: No    Review of Systems: All negative except as stated above in HPI.  Physical Exam: Vital signs: Vitals:   06/25/23 0841 06/25/23 0900  BP:  (!) 155/87  Pulse:  84  Resp:  17  Temp: 98.4 F (36.9 C)   SpO2:  100%     General:   Elderly patient, resting comfortably, not in acute distress Lungs: No visible respiratory distress Heart:  Regular rate and rhythm; no murmurs, clicks, rubs,  or gallops. Abdomen: Soft, nontender, nondistended, bowel sound present, no peritoneal signs Mood and affect normal Alert and oriented x 3 Rectal:  Deferred  GI:  Lab Results: Recent Labs    06/25/23 0439  WBC 6.9  HGB 10.3*  HCT 31.6*  PLT 133*   BMET Recent Labs    06/25/23 0439  NA 137  K 3.3*  CL 107  CO2 24  GLUCOSE 101*  BUN 17  CREATININE 0.90  CALCIUM 8.0*   LFT Recent Labs    06/25/23 0439  PROT 6.0*  ALBUMIN 2.8*  AST 12*  ALT 14  ALKPHOS 35*  BILITOT 1.3*   PT/INR Recent Labs    06/25/23 0439  LABPROT 14.9  INR 1.2     Studies/Results: No results found.  Impression/Plan: -Bright red blood per rectum in a patient with history of diverticular bleed requiring colonoscopy and epinephrine injection in the diverticulum in 2020.  Location of diverticulum was difficult and clip placement was not able to achieve because of difficult location. -Acute blood loss anemia.  Status post 2 units of blood transfusion -Recent diagnosis of breast cancer.  Had chemotherapy few days ago.  Recommendations ------------------------ -Patient with most likely diverticular bleed.  Most diverticular bleed resolved without any  intervention in 48 hours. -Recommend supportive care in ICU for now. -If ongoing bleeding, consider GI bleeding scan and if positive GI bleeding scan would benefit from  IR embolization. -Start clear liquid diet.  Monitor H&H.  Transfuse as needed. -GI will follow    LOS: 0 days   Kathi Der  MD, FACP 06/25/2023, 9:58 AM  Contact #  [phone]

## 2023-06-26 DIAGNOSIS — K5791 Diverticulosis of intestine, part unspecified, without perforation or abscess with bleeding: Secondary | ICD-10-CM | POA: Diagnosis not present

## 2023-06-26 DIAGNOSIS — C50412 Malignant neoplasm of upper-outer quadrant of left female breast: Secondary | ICD-10-CM | POA: Diagnosis not present

## 2023-06-26 LAB — CBC
HCT: 27.8 % — ABNORMAL LOW (ref 36.0–46.0)
HCT: 29.5 % — ABNORMAL LOW (ref 36.0–46.0)
Hemoglobin: 9.1 g/dL — ABNORMAL LOW (ref 12.0–15.0)
Hemoglobin: 9.5 g/dL — ABNORMAL LOW (ref 12.0–15.0)
MCH: 28.1 pg (ref 26.0–34.0)
MCH: 27.5 pg (ref 26.0–34.0)
MCHC: 32.7 g/dL (ref 30.0–36.0)
MCHC: 32.2 g/dL (ref 30.0–36.0)
MCV: 85.8 fL (ref 80.0–100.0)
MCV: 85.5 fL (ref 80.0–100.0)
Platelets: 125 10*3/uL — ABNORMAL LOW (ref 150–400)
Platelets: 137 10*3/uL — ABNORMAL LOW (ref 150–400)
RBC: 3.24 MIL/uL — ABNORMAL LOW (ref 3.87–5.11)
RBC: 3.45 MIL/uL — ABNORMAL LOW (ref 3.87–5.11)
RDW: 14.2 % (ref 11.5–15.5)
RDW: 14.2 % (ref 11.5–15.5)
WBC: 3.6 10*3/uL — ABNORMAL LOW (ref 4.0–10.5)
WBC: 4 10*3/uL (ref 4.0–10.5)
nRBC: 0 % (ref 0.0–0.2)
nRBC: 0.8 % — ABNORMAL HIGH (ref 0.0–0.2)

## 2023-06-26 NOTE — Progress Notes (Signed)
[facility] Gastroenterology Progress Note  Melanie Welch 77 y.o. [birth year]  CC: Rectal bleeding   Subjective: Patient seen and examined at bedside.  Tolerating diet.  Denies any significant bleeding but had some dark stools last night.  No bowel movement this morning.  ROS : Afebrile, negative for chest pain   Objective: Vital signs in last 24 hours: Vitals:   06/26/23 0320 06/26/23 0700  BP:  (!) 164/83  Pulse:  88  Resp:  17  Temp: 98.9 F (37.2 C)   SpO2:  100%    Physical Exam:  General:   Elderly patient, resting comfortably, not in acute distress Lungs: No visible respiratory distress Heart:  Regular rate and rhythm; no murmurs, clicks, rubs,  or gallops. Abdomen: Soft, nontender, nondistended, bowel sound present, no peritoneal signs Mood and affect normal Alert and oriented x 3  Lab Results: Recent Labs    06/25/23 0439  NA 137  K 3.3*  CL 107  CO2 24  GLUCOSE 101*  BUN 17  CREATININE 0.90  CALCIUM 8.0*   Recent Labs    06/25/23 0439  AST 12*  ALT 14  ALKPHOS 35*  BILITOT 1.3*  PROT 6.0*  ALBUMIN 2.8*   Recent Labs    06/25/23 1640 06/26/23 0654  WBC 4.0 3.6*  HGB 9.3* 9.1*  HCT 28.7* 27.8*  MCV 86.7 85.8  PLT 133* 125*   Recent Labs    06/25/23 0439  LABPROT 14.9  INR 1.2      Assessment/Plan: -Bright red blood per rectum in a patient with history of diverticular bleed requiring colonoscopy and epinephrine injection in the diverticulum in 2020.  Location of diverticulum was difficult and clip placement was not able to achieve because of difficult location. -Acute blood loss anemia.  Status post 2 units of blood transfusion -Recent diagnosis of breast cancer.  Had chemotherapy few days ago.   Recommendations ------------------------ -Patient with most likely diverticular bleed which has resolved now.  Hemoglobin stable. -No plan for inpatient colonoscopy at this time.   -Advance diet as tolerated.   -GI will sign off.   -Follow-up with primary GI at [facility] after discharge.    Kathi Der MD, FACP 06/26/2023, 8:22 AM  Contact #  [phone]

## 2023-06-26 NOTE — Progress Notes (Signed)
   NAME:  Melanie Welch, MRN:  [id number], DOB:  Mar 17, 1946, LOS: 1 ADMISSION DATE:  06/25/2023, CONSULTATION DATE:  06/26/2023 REFERRING MD:  ED Sovah [place], CHIEF COMPLAINT:  Blood per rectum    History of Present Illness:  77 year old woman who suddenly started passing blood per rectum yesterday. Initially associated with lower abdominal cramping which has subsided to a dull persistent ache. She reports a steady stream of bloody motions.   Initially hypotensive to the 60's at [facility], she has improved after 2 units PRBC and has been hypertensive since.  CTA there reportedly negative.   She apparently had a similar episode 3 years ago and has known diverticular disease.   On ROS she has recently undergone L mastectomy and is currently receiving adjuvant chemotherapy for stage III breast cancer. She has been tolerating chemotherapy well without nausea. She denies dyspnea, fever. Appetite has been good. No dizziness, weakness or sensory changes. Some tenderness at mastectomy site but no drainage.   Significant Hospital Events: Including procedures, antibiotic start and stop dates in addition to other pertinent events   11/24 - admission. S/p PRBC x 2  Interim History / Subjective:  Last bowel movement last night - dark. No BRBPR  Objective   Blood pressure (!) 152/86, pulse 78, temperature 97.8 F (36.6 C), temperature source Oral, resp. rate 20, height 5\' 8"  (1.727 m), weight 68.9 kg, SpO2 99%.        Intake/Output Summary (Last 24 hours) at 06/26/2023 1014 Last data filed at 06/26/2023 0320 Gross per 24 hour  Intake 750.21 ml  Output 1 ml  Net 749.21 ml   Filed Weights   06/25/23 0300 06/26/23 0430  Weight: 66.6 kg 68.9 kg    Physical Exam: General: Well-appearing, no acute distress HENT: [place], AT, OP clear, MMM Eyes: EOMI, no scleral icterus Respiratory: Clear to auscultation bilaterally.  No crackles, wheezing or rales Cardiovascular: RRR, -M/R/G, no JVD GI: BS+, soft,  nontender Extremities:-Edema,-tenderness Neuro: AAO x4, CNII-XII grossly intact Skin: Intact, no rashes or bruising Psych: Normal mood, normal affect   Ancillary Tests:  Hg 9.1, stable Plt 125 Assessment & Plan:   Lower GI bleed secondary to diverticulosis - resolving. Hg stable Stage III breast cancer on treatment. HTN Type 2 DM  Plan:  - Appreciate GI consult. No plan for inpatient colonoscopy. Team has signed off - Active bleeding stopped based on CTA. May be passing left over blood. - Trend CBC and transfuse if HB < 8 - Continue home antihypertensives - SSI for glycemic control.  Best Practice (right click and "Reselect all SmartList Selections" daily)   Diet/type: Regular consistency (see orders) DVT prophylaxis: SCDs Start: 06/25/23 0257   Pressure ulcer(s): not present on admission  GI prophylaxis: PPI Lines: N/A Foley:  N/A Code Status:  full code Last date of multidisciplinary goals of care discussion [patient updated.]  Care Time: 50 min  Mechele Collin, M.D. [facility] Pulmonary/Critical Care Medicine 06/26/2023 10:14 AM   See Amion for personal pager For hours between 7 PM to 7 AM, please call Elink for urgent questions

## 2023-06-27 DIAGNOSIS — K5791 Diverticulosis of intestine, part unspecified, without perforation or abscess with bleeding: Secondary | ICD-10-CM | POA: Diagnosis not present

## 2023-06-27 DIAGNOSIS — E876 Hypokalemia: Secondary | ICD-10-CM

## 2023-06-27 DIAGNOSIS — I10 Essential (primary) hypertension: Secondary | ICD-10-CM | POA: Diagnosis not present

## 2023-06-27 LAB — CBC
HCT: 28.5 % — ABNORMAL LOW (ref 36.0–46.0)
HCT: 32.4 % — ABNORMAL LOW (ref 36.0–46.0)
Hemoglobin: 9.3 g/dL — ABNORMAL LOW (ref 12.0–15.0)
Hemoglobin: 10.4 g/dL — ABNORMAL LOW (ref 12.0–15.0)
MCH: 28.1 pg (ref 26.0–34.0)
MCH: 27.9 pg (ref 26.0–34.0)
MCHC: 32.6 g/dL (ref 30.0–36.0)
MCHC: 32.1 g/dL (ref 30.0–36.0)
MCV: 86.1 fL (ref 80.0–100.0)
MCV: 86.9 fL (ref 80.0–100.0)
Platelets: 139 10*3/uL — ABNORMAL LOW (ref 150–400)
Platelets: 153 10*3/uL (ref 150–400)
RBC: 3.31 MIL/uL — ABNORMAL LOW (ref 3.87–5.11)
RBC: 3.73 MIL/uL — ABNORMAL LOW (ref 3.87–5.11)
RDW: 13.9 % (ref 11.5–15.5)
RDW: 14.2 % (ref 11.5–15.5)
WBC: 6.8 10*3/uL (ref 4.0–10.5)
WBC: 15.2 10*3/uL — ABNORMAL HIGH (ref 4.0–10.5)
nRBC: 1.6 % — ABNORMAL HIGH (ref 0.0–0.2)
nRBC: 1.8 % — ABNORMAL HIGH (ref 0.0–0.2)

## 2023-06-27 LAB — BASIC METABOLIC PANEL
Anion gap: 9 (ref 5–15)
BUN: 10 mg/dL (ref 8–23)
CO2: 23 mmol/L (ref 22–32)
Calcium: 8 mg/dL — ABNORMAL LOW (ref 8.9–10.3)
Chloride: 108 mmol/L (ref 98–111)
Creatinine, Ser: 0.95 mg/dL (ref 0.44–1.00)
GFR, Estimated: >60 mL/min (ref >60)
Glucose, Bld: 121 mg/dL — ABNORMAL HIGH (ref 70–99)
Potassium: 3.1 mmol/L — ABNORMAL LOW (ref 3.5–5.1)
Sodium: 140 mmol/L (ref 135–145)

## 2023-06-27 LAB — URINALYSIS, ROUTINE W REFLEX MICROSCOPIC
Bilirubin Urine: NEGATIVE
Glucose, UA: NEGATIVE mg/dL
Hgb urine dipstick: NEGATIVE
Ketones, ur: NEGATIVE mg/dL
Leukocytes,Ua: NEGATIVE
Nitrite: NEGATIVE
Protein, ur: NEGATIVE mg/dL
Specific Gravity, Urine: 1.006 (ref 1.005–1.030)
pH: 7 (ref 5.0–8.0)

## 2023-06-27 LAB — MAGNESIUM: Magnesium: 1.4 mg/dL — ABNORMAL LOW (ref 1.7–2.4)

## 2023-06-27 MED ORDER — POTASSIUM CHLORIDE CRYS ER 20 MEQ PO TBCR
40.0000 meq | EXTENDED_RELEASE_TABLET | ORAL | Status: AC
  Administered 2023-06-27 (×2): 40 meq via ORAL
  Filled 2023-06-27 (×2): qty 2

## 2023-06-27 MED ORDER — MAGNESIUM SULFATE 4 GM/100ML IV SOLN
4.0000 g | Freq: Once | INTRAVENOUS | Status: AC
  Administered 2023-06-27: 4 g via INTRAVENOUS
  Filled 2023-06-27: qty 100

## 2023-06-27 NOTE — Evaluation (Signed)
Physical Therapy Evaluation-1x Patient Details Name: Melanie Welch MRN: [id number] DOB: [birth year] Today's Date: 06/27/2023  History of Present Illness  77 yo female admitted with GI bleed. Hx of breast ca s/p mastectomy 06/10/23-undergoing ca tx, DM, essential HTN  Clinical Impression  On eval, pt was Mod Ind with mobility. She walked ~200 feet around the unit. Mild unsteadiness intermittently but no LOB. Pt tolerated activity well. O2 100% on RA, HR 112 bpm. Pt denied dizziness, fatigue. Pt reports she has been doing wall exercises for L UE (s/p mastectomy 06/10/23). Could possibly benefit from f/u PT at some point for L UE but will defer to oncology MD.  Recommend daily hallway ambulation with nursing and/or mobility team. 1x eval. Will sign off.         If plan is discharge home, recommend the following:     Can travel by private vehicle        Equipment Recommendations None recommended by PT  Recommendations for Other Services       Functional Status Assessment       Precautions / Restrictions Precautions Precaution Comments: L UE 2* mastectomy Restrictions Weight Bearing Restrictions: No      Mobility  Bed Mobility                    Transfers Overall transfer level: Modified independent                      Ambulation/Gait Ambulation/Gait assistance: Modified independent (Device/Increase time) Gait Distance (Feet): 200 Feet Assistive device: None Gait Pattern/deviations: Step-through pattern       General Gait Details: Mild intermittent unsteadiness but no LOB. Tolerated distance well. O2 100% on RA, HR 112 bpm. Pt denied dyspnea.  Stairs            Wheelchair Mobility     Tilt Bed    Modified Rankin (Stroke Patients Only)       Balance                                             Pertinent Vitals/Pain Pain Assessment Pain Assessment: No/denies pain    Home Living Family/patient expects to be  discharged to:: Private residence Living Arrangements: Alone Available Help at Discharge: Family Type of Home: House Home Access: Stairs to enter Entrance Stairs-Rails: Right Entrance Stairs-Number of Steps: 2   Home Layout: One level Home Equipment: None      Prior Function Prior Level of Function : Independent/Modified Independent;Driving                     Extremity/Trunk Assessment                Communication   Communication Communication: No apparent difficulties  Cognition                                                General Comments      Exercises     Assessment/Plan    PT Assessment    PT Problem List         PT Treatment Interventions      PT Goals (Current goals can be found in the Care Plan section)  Frequency       Co-evaluation               AM-PAC PT "6 Clicks" Mobility  Outcome Measure Help needed turning from your back to your side while in a flat bed without using bedrails?: None Help needed moving from lying on your back to sitting on the side of a flat bed without using bedrails?: None Help needed moving to and from a bed to a chair (including a wheelchair)?: None Help needed standing up from a chair using your arms (e.g., wheelchair or bedside chair)?: None Help needed to walk in hospital room?: None Help needed climbing 3-5 steps with a railing? : None 6 Click Score: 24    End of Session   Activity Tolerance: Patient tolerated treatment well Patient left: in bed;with call bell/phone within reach        Time: 1059-1130 PT Time Calculation (min) (ACUTE ONLY): 31 min   Charges:   PT Evaluation $PT Eval Low Complexity: 1 Low PT Treatments $Gait Training: 8-22 mins PT General Charges $$ ACUTE PT VISIT: 1 Visit          Faye Ramsay, PT Acute Rehabilitation  Office: [phone]

## 2023-06-27 NOTE — Progress Notes (Signed)
[facility] PROGRESS NOTE   Melanie Welch EXB:[id number] DOB: 09-19-1945 DOA: 06/25/2023  PCP: Melanie Found, MD  Brief History: 77 year old female with past medical history of breast cancer under active treatment, diabetes mellitus, essential hypertension who presented with rectal bleeding.  Was hospitalized initially to the ICU due to hypotension.  Gastroenterology was consulted.    Consultants: Gastroenterology  Procedures: None    Subjective/Interval History: Patient still passing blood in stool but she mentions that it is now dark in color.  Denies any abdominal pain nausea or vomiting.    Assessment/Plan:  Hematochezia likely secondary to diverticulosis Passing old blood.  Patient was seen by gastroenterology.  No role for colonoscopy at this time.  Hemoglobin stable for the last 24 hours.  Start mobilizing.  Hypokalemia and hypomagnesemia Will be supplemented.  Low-grade fever Check UA.  Left breast cancer Status post left mastectomy on 10//2024. Followed by medical oncology.  Currently getting chemotherapy.  Also to get adjuvant radiation therapy.  This is to be followed by adjuvant antiestrogen therapy.  Will notify medical oncology of her hospitalization.  Diabetes mellitus type 2 Noted to be on metformin prior to admission.  Monitor CBGs.  Essential hypertension Noted to be on amlodipine and losartan.  Continue to monitor blood pressures.   DVT Prophylaxis: SCDs only for now Code Status: Full code Family Communication: Discussed with patient Disposition Plan: Anticipate discharge home tomorrow.  She lives by herself.  Will have PT and OT evaluate her.  Status is: Inpatient Remains inpatient appropriate because: GI bleed.  Electrolyte abnormalities.      Medications: Scheduled:  amLODipine  10 mg Oral QHS   Chlorhexidine Gluconate Cloth  6 each Topical Daily   losartan  100 mg Oral Daily   pantoprazole (PROTONIX) IV  40 mg Intravenous Q24H    potassium chloride  40 mEq Oral Q4H   Continuous:  magnesium sulfate bolus IVPB 4 g (06/27/23 0847)   NUU:VOZDGUYQ sodium, methocarbamol, mouth rinse, oxyCODONE, polyethylene glycol   Objective:  Vital Signs  Vitals:   06/27/23 0700 06/27/23 0712 06/27/23 0755 06/27/23 0800  BP: 129/74   (!) 153/84  Pulse:    87  Resp: (!) 22   17  Temp:   98.7 F (37.1 C)   TempSrc:   Oral   SpO2:    100%  Weight:  66.3 kg    Height:       No intake or output data in the 24 hours ending 06/27/23 0851 Filed Weights   06/25/23 0300 06/26/23 0430 06/27/23 0712  Weight: 66.6 kg 68.9 kg 66.3 kg    General appearance: Awake alert.  In no distress Resp: Clear to auscultation bilaterally.  Normal effort Cardio: S1-S2 is normal regular.  No S3-S4.  No rubs murmurs or bruit GI: Abdomen is soft.  Nontender nondistended.  Bowel sounds are present normal.  No masses organomegaly Extremities: No edema.  Full range of motion of lower extremities. Neurologic: Alert and oriented x3.  No focal neurological deficits.    Lab Results:  Data Reviewed: I have personally reviewed following labs and reports of the imaging studies  CBC: Recent Labs  Lab 06/20/23 1127 06/20/23 1127 06/25/23 0439 06/25/23 1048 06/25/23 1640 06/26/23 0654 06/26/23 1701 06/27/23 0525  WBC 5.0  --  6.9  --  4.0 3.6* 4.0 6.8  NEUTROABS 2.5  --   --   --   --   --   --   --  HGB 10.4*   < > 10.3* 9.6* 9.3* 9.1* 9.5* 9.3*  HCT 31.7*  --  31.6* 29.2* 28.7* 27.8* 29.5* 28.5*  MCV 85.2  --  85.4  --  86.7 85.8 85.5 86.1  PLT 252  --  133*  --  133* 125* 137* 139*   < > = values in this interval not displayed.    Basic Metabolic Panel: Recent Labs  Lab 06/20/23 1127 06/25/23 0439 06/27/23 0525  NA 140 137 140  K 3.5 3.3* 3.1*  CL 105 107 108  CO2 26 24 23   GLUCOSE 203* 101* 121*  BUN 15 17 10   CREATININE 1.12* 0.90 0.95  CALCIUM 9.2 8.0* 8.0*  MG  --   --  1.4*    GFR: Estimated Creatinine Clearance: 50  mL/min (by C-G formula based on SCr of 0.95 mg/dL).  Liver Function Tests: Recent Labs  Lab 06/20/23 1127 06/25/23 0439  AST 11* 12*  ALT 11 14  ALKPHOS 38 35*  BILITOT 0.3 1.3*  PROT 7.5 6.0*  ALBUMIN 3.7 2.8*    Coagulation Profile: Recent Labs  Lab 06/25/23 0439  INR 1.2    Recent Results (from the past 240 hour(s))  MRSA Next Gen by PCR, Nasal     Status: None   Collection Time: 06/25/23  3:14 AM   Specimen: Nasal Mucosa; Nasal Swab  Result Value Ref Range Status   MRSA by PCR Next Gen NOT DETECTED NOT DETECTED Final    Comment: (NOTE) The GeneXpert MRSA Assay (FDA approved for NASAL specimens only), is one component of a comprehensive MRSA colonization surveillance program. It is not intended to diagnose MRSA infection nor to guide or monitor treatment for MRSA infections. Test performance is not FDA approved in patients less than 52 years old. Performed at [facility], [address] [place], [place] [address]       Radiology Studies: No results Welch.     LOS: 2 days   Melanie Welch Melanie Welch  [facility] Pager on [email or web address]  06/27/2023, 8:51 AM

## 2023-06-27 NOTE — Progress Notes (Signed)
   06/27/23 1144  TOC Brief Assessment  Insurance and Status Reviewed  Patient has primary care physician Yes  Home environment has been reviewed Single family home  Prior level of function: Independent  Prior/Current Home Services No current home services  Social Determinants of Health Reivew SDOH reviewed no interventions necessary  Readmission risk has been reviewed Yes  Transition of care needs no transition of care needs at this time

## 2023-06-27 NOTE — Progress Notes (Signed)
OT Cancellation Note  Patient Details Name: Melanie Welch MRN: [id number] DOB: 26-May-1946   Cancelled Treatment:    Reason Eval/Treat Not Completed: OT screened, no needs identified, will sign off Patient in room with PT indicating MI for ADLs. OT to sign off at this time.  Rosalio Loud, MS Acute Rehabilitation Department Office# [phone]  06/27/2023, 11:13 AM

## 2023-06-27 NOTE — Progress Notes (Signed)
Report called to RN on 5th floor. Pt going to room 1506 via WC.

## 2023-06-28 DIAGNOSIS — K5791 Diverticulosis of intestine, part unspecified, without perforation or abscess with bleeding: Secondary | ICD-10-CM | POA: Diagnosis not present

## 2023-06-28 LAB — BASIC METABOLIC PANEL
Anion gap: 7 (ref 5–15)
BUN: 10 mg/dL (ref 8–23)
CO2: 23 mmol/L (ref 22–32)
Calcium: 7.9 mg/dL — ABNORMAL LOW (ref 8.9–10.3)
Chloride: 109 mmol/L (ref 98–111)
Creatinine, Ser: 1.05 mg/dL — ABNORMAL HIGH (ref 0.44–1.00)
GFR, Estimated: 55 mL/min — ABNORMAL LOW (ref >60)
Glucose, Bld: 116 mg/dL — ABNORMAL HIGH (ref 70–99)
Potassium: 3.4 mmol/L — ABNORMAL LOW (ref 3.5–5.1)
Sodium: 139 mmol/L (ref 135–145)

## 2023-06-28 LAB — MAGNESIUM: Magnesium: 2.1 mg/dL (ref 1.7–2.4)

## 2023-06-28 LAB — CBC
HCT: 28.7 % — ABNORMAL LOW (ref 36.0–46.0)
Hemoglobin: 9.3 g/dL — ABNORMAL LOW (ref 12.0–15.0)
MCH: 27.8 pg (ref 26.0–34.0)
MCHC: 32.4 g/dL (ref 30.0–36.0)
MCV: 85.9 fL (ref 80.0–100.0)
Platelets: 152 10*3/uL (ref 150–400)
RBC: 3.34 MIL/uL — ABNORMAL LOW (ref 3.87–5.11)
RDW: 14.3 % (ref 11.5–15.5)
WBC: 23.2 10*3/uL — ABNORMAL HIGH (ref 4.0–10.5)
nRBC: 1.1 % — ABNORMAL HIGH (ref 0.0–0.2)

## 2023-06-28 MED ORDER — PANTOPRAZOLE SODIUM 40 MG PO TBEC: 40.0000 mg | DELAYED_RELEASE_TABLET | Freq: Every day | ORAL | 1 refills | Status: DC

## 2023-06-28 MED ORDER — POTASSIUM CHLORIDE CRYS ER 20 MEQ PO TBCR
40.0000 meq | EXTENDED_RELEASE_TABLET | Freq: Once | ORAL | Status: AC
  Administered 2023-06-28: 40 meq via ORAL
  Filled 2023-06-28: qty 2

## 2023-06-28 NOTE — Discharge Summary (Signed)
[facility]  Physician Discharge Summary   Patient ID: Melanie Welch MRN: [id number] DOB/AGE: 12-31-1945 77 y.o.  Admit date: 06/25/2023 Discharge date:   06/28/2023   PCP: Assunta Found, MD  DISCHARGE DIAGNOSES:  Acute diverticular bleed, resolved Left breast cancer Leukocytosis secondary to Neulasta    RECOMMENDATIONS FOR OUTPATIENT FOLLOW UP: Follow-up with medical oncology   Home Health: None Equipment/Devices: None  CODE STATUS: Full code  DISCHARGE CONDITION: fair  Diet recommendation: Modified carbohydrate  INITIAL HISTORY: 77 year old female with past medical history of breast cancer under active treatment, diabetes mellitus, essential hypertension who presented with rectal bleeding.  Was hospitalized initially to the ICU due to hypotension.  Gastroenterology was consulted.     Consultants: Gastroenterology   Procedures: None  HOSPITAL COURSE:   Hematochezia likely secondary to diverticulosis Passing old blood.  Patient was seen by gastroenterology.  No role for colonoscopy at this time.  Hemoglobin stable for the last 24 hours.     Hypokalemia and hypomagnesemia Supplement prior to discharge   Low-grade fever UA unremarkable.  Fever appears to have subsided.  No clear indication for antibiotics. Leukocytosis is due to the Neulasta that she received on 11/20.   Left breast cancer Status post left mastectomy on 10//2024. Followed by medical oncology.  Currently getting chemotherapy.  Also to get adjuvant radiation therapy.  This is to be followed by adjuvant antiestrogen therapy.   Oncology was notified of this admission.  Patient has an appointment with Dr. Pamelia Hoit on 12/3.   Diabetes mellitus type 2 Noted to be on metformin prior to admission.     Essential hypertension Noted to be on amlodipine and losartan.    Patient is stable.  Okay for discharge home today.  PERTINENT LABS:  The results of significant diagnostics from this  hospitalization (including imaging, microbiology, ancillary and laboratory) are listed below for reference.    Microbiology: Recent Results (from the past 240 hour(s))  MRSA Next Gen by PCR, Nasal     Status: None   Collection Time: 06/25/23  3:14 AM   Specimen: Nasal Mucosa; Nasal Swab  Result Value Ref Range Status   MRSA by PCR Next Gen NOT DETECTED NOT DETECTED Final    Comment: (NOTE) The GeneXpert MRSA Assay (FDA approved for NASAL specimens only), is one component of a comprehensive MRSA colonization surveillance program. It is not intended to diagnose MRSA infection nor to guide or monitor treatment for MRSA infections. Test performance is not FDA approved in patients less than 2 years old. Performed at [facility], [address], [place], [place] [address]      Labs:   Basic Metabolic Panel: Recent Labs  Lab 06/25/23 0439 06/27/23 0525 06/28/23 0520  NA 137 140 139  K 3.3* 3.1* 3.4*  CL 107 108 109  CO2 24 23 23   GLUCOSE 101* 121* 116*  BUN 17 10 10   CREATININE 0.90 0.95 1.05*  CALCIUM 8.0* 8.0* 7.9*  MG  --  1.4* 2.1   Liver Function Tests: Recent Labs  Lab 06/25/23 0439  AST 12*  ALT 14  ALKPHOS 35*  BILITOT 1.3*  PROT 6.0*  ALBUMIN 2.8*    CBC: Recent Labs  Lab 06/26/23 0654 06/26/23 1701 06/27/23 0525 06/27/23 1713 06/28/23 0520  WBC 3.6* 4.0 6.8 15.2* 23.2*  HGB 9.1* 9.5* 9.3* 10.4* 9.3*  HCT 27.8* 29.5* 28.5* 32.4* 28.7*  MCV 85.8 85.5 86.1 86.9 85.9  PLT 125* 137* 139* 153 152  IMAGING STUDIES DG Chest Port 1 View  Result Date: 06/10/2023 CLINICAL DATA:  Port-A-Cath placement EXAM: PORTABLE CHEST 1 VIEW COMPARISON:  None Available. FINDINGS: Right chest port catheter tip projects over the distal SVC. There surgical clips in the left axilla. The heart size and mediastinal contours are within normal limits. Both lungs are clear. There is no pleural effusion or pneumothorax. The visualized skeletal structures  are unremarkable. IMPRESSION: Right chest port catheter tip projects over the distal SVC. No pneumothorax. Electronically Signed   By: Darliss Cheney M.D.   On: 06/10/2023 18:50   DG C-Arm 1-60 Min-No Report  Result Date: 06/10/2023 Fluoroscopy was utilized by the requesting physician.  No radiographic interpretation.    DISCHARGE EXAMINATION: Vitals:   06/27/23 1910 06/27/23 2153 06/28/23 0450 06/28/23 1010  BP: 131/81 (!) 141/75 132/76 126/77  Pulse: 96 92 94   Resp: 16  16   Temp: 99.4 F (37.4 C)  98.7 F (37.1 C)   TempSrc:      SpO2: 100%  96%   Weight:      Height:       General appearance: Awake alert.  In no distress Resp: Clear to auscultation bilaterally.  Normal effort Cardio: S1-S2 is normal regular.  No S3-S4.  No rubs murmurs or bruit GI: Abdomen is soft.  Nontender nondistended.  Bowel sounds are present normal.  No masses organomegaly   DISPOSITION: Home  Discharge Instructions     Call MD for:  difficulty breathing, headache or visual disturbances   Complete by: As directed    Call MD for:  extreme fatigue   Complete by: As directed    Call MD for:  persistant dizziness or light-headedness   Complete by: As directed    Call MD for:  persistant nausea and vomiting   Complete by: As directed    Call MD for:  severe uncontrolled pain   Complete by: As directed    Call MD for:  temperature >100.4   Complete by: As directed    Diet - low sodium heart healthy   Complete by: As directed    Discharge instructions   Complete by: As directed    Seek attention if bleeding recurs.  You were cared for by a hospitalist during your hospital stay. If you have any questions about your discharge medications or the care you received while you were in the hospital after you are discharged, you can call the unit and asked to speak with the hospitalist on call if the hospitalist that took care of you is not available. Once you are discharged, your primary care physician  will handle any further medical issues. Please note that NO REFILLS for any discharge medications will be authorized once you are discharged, as it is imperative that you return to your primary care physician (or establish a relationship with a primary care physician if you do not have one) for your aftercare needs so that they can reassess your need for medications and monitor your lab values. If you do not have a primary care physician, you can call [phone] for a physician referral.   Increase activity slowly   Complete by: As directed          Allergies as of 06/28/2023       Reactions   Lipitor [atorvastatin] Nausea And Vomiting        Medication List     TAKE these medications    amLODipine 10 MG tablet Commonly known as: NORVASC  Take 10 mg by mouth at bedtime.   dexamethasone 4 MG tablet Commonly known as: DECADRON Take 1 tablet day before chemo and 1 tablet day after chemo with food   gabapentin 100 MG capsule Commonly known as: NEURONTIN Take 100 mg by mouth as needed (nerve pain).   lidocaine-prilocaine cream Commonly known as: EMLA Apply to affected area once What changed:  how much to take how to take this when to take this reasons to take this additional instructions   losartan 100 MG tablet Commonly known as: COZAAR Take 100 mg by mouth daily.   metFORMIN 1000 MG tablet Commonly known as: GLUCOPHAGE Take 1,000 mg by mouth 2 (two) times daily with a meal.   methocarbamol 500 MG tablet Commonly known as: ROBAXIN Take 1 tablet (500 mg total) by mouth every 8 (eight) hours as needed for muscle spasms. What changed: when to take this   ondansetron 8 MG tablet Commonly known as: Zofran Take 1 tablet (8 mg total) by mouth every 8 (eight) hours as needed for nausea or vomiting. Start on the third day after chemotherapy.   oxyCODONE 5 MG immediate release tablet Commonly known as: Oxy IR/ROXICODONE Take 1 tablet (5 mg total) by mouth every 6 (six)  hours as needed for moderate pain (pain score 4-6).   pantoprazole 40 MG tablet Commonly known as: Protonix Take 1 tablet (40 mg total) by mouth daily.   prochlorperazine 10 MG tablet Commonly known as: COMPAZINE Take 1 tablet (10 mg total) by mouth every 6 (six) hours as needed for nausea or vomiting.          Follow-up Information     Serena Croissant, MD Follow up.   Specialty: Hematology and Oncology Contact information: [address] [place] [address] [phone]         Assunta Found, MD. Schedule an appointment as soon as possible for a visit in 1 week(s).   Specialty: Family Medicine Why: post hospitalization follow up Contact information: [address] [place] [address] [phone]         [facility] Follow up.   Contact information: [address] [place] [address] [phone]                TOTAL DISCHARGE TIME: 35 minutes  Rael Yo Rito Ehrlich  [facility] Pager on [email or web address]  06/28/2023, 10:27 AM

## 2023-06-28 NOTE — Progress Notes (Signed)
Patient is discharged to home, AVS reviewed and all questions answered.

## 2023-07-04 ENCOUNTER — Other Ambulatory Visit: Payer: Self-pay

## 2023-07-04 DIAGNOSIS — Z170 Estrogen receptor positive status [ER+]: Secondary | ICD-10-CM

## 2023-07-04 NOTE — Assessment & Plan Note (Signed)
Palpable concern left axilla, mammogram showed 0.5 cm mass left breast 1:30 position possibly an intramammary lymph node, at the site of palpable concern there was an abnormal lymph node.  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, left axillary lymph node biopsy positive, no evidence of extranodal extension, ER 100%, PR 99%, Ki-67 50%, HER2 1+ negative, stage IIa    05/06/2023: Left mastectomy: Grade 3 ILC with LCIS, 5 cm, margins negative, lymphovascular base involvement present, 3/4 lymph nodes positive, ER 100%, PR 99%, HER2 negative 1+, Ki-67 50% 06/10/2023: ALND: 0/1 lymph node   Treatment plan: Based upon tumor size greater than 5 cm and presence of 3 positive lymph nodes being grade 3 and Ki-67 of 50% I recommended systemic chemotherapy with Taxotere and Cytoxan every 3 weeks x 4 Adjuvant radiation therapy followed by Adjuvant antiestrogen therapy --------------------------------------------------------------------------------------------------------------------------- Current treatment: Cycle 2 Taxotere and Cytoxan Chemo toxicities: Hospitalization 06/25/2023-06/28/2023: Acute diverticular bleed Chemotherapy-induced anemia

## 2023-07-05 ENCOUNTER — Inpatient Hospital Stay: Payer: Medicare HMO | Attending: Hematology and Oncology

## 2023-07-05 ENCOUNTER — Inpatient Hospital Stay: Payer: Medicare HMO | Admitting: Hematology and Oncology

## 2023-07-05 VITALS — BP 143/76 | HR 98 | Temp 98.0°F | Resp 18 | Ht 68.0 in | Wt 149.0 lb

## 2023-07-05 DIAGNOSIS — Z95828 Presence of other vascular implants and grafts: Secondary | ICD-10-CM | POA: Insufficient documentation

## 2023-07-05 DIAGNOSIS — E119 Type 2 diabetes mellitus without complications: Secondary | ICD-10-CM | POA: Insufficient documentation

## 2023-07-05 DIAGNOSIS — Z5189 Encounter for other specified aftercare: Secondary | ICD-10-CM | POA: Diagnosis not present

## 2023-07-05 DIAGNOSIS — Z170 Estrogen receptor positive status [ER+]: Secondary | ICD-10-CM

## 2023-07-05 DIAGNOSIS — C50412 Malignant neoplasm of upper-outer quadrant of left female breast: Secondary | ICD-10-CM | POA: Diagnosis present

## 2023-07-05 DIAGNOSIS — Z5111 Encounter for antineoplastic chemotherapy: Secondary | ICD-10-CM | POA: Insufficient documentation

## 2023-07-05 DIAGNOSIS — R6889 Other general symptoms and signs: Secondary | ICD-10-CM | POA: Diagnosis not present

## 2023-07-05 DIAGNOSIS — Z79899 Other long term (current) drug therapy: Secondary | ICD-10-CM | POA: Insufficient documentation

## 2023-07-05 HISTORY — DX: Presence of other vascular implants and grafts: Z95.828

## 2023-07-05 LAB — CBC WITH DIFFERENTIAL (CANCER CENTER ONLY)
Abs Immature Granulocytes: 0.08 10*3/uL — ABNORMAL HIGH (ref 0.00–0.07)
Basophils Absolute: 0.1 10*3/uL (ref 0.0–0.1)
Basophils Relative: 1 %
Eosinophils Absolute: 0 10*3/uL (ref 0.0–0.5)
Eosinophils Relative: 0 %
HCT: 29.3 % — ABNORMAL LOW (ref 36.0–46.0)
Hemoglobin: 9.3 g/dL — ABNORMAL LOW (ref 12.0–15.0)
Immature Granulocytes: 1 %
Lymphocytes Relative: 19 %
Lymphs Abs: 1.3 10*3/uL (ref 0.7–4.0)
MCH: 27.6 pg (ref 26.0–34.0)
MCHC: 31.7 g/dL (ref 30.0–36.0)
MCV: 86.9 fL (ref 80.0–100.0)
Monocytes Absolute: 0.6 10*3/uL (ref 0.1–1.0)
Monocytes Relative: 9 %
Neutro Abs: 4.9 10*3/uL (ref 1.7–7.7)
Neutrophils Relative %: 70 %
Platelet Count: 174 10*3/uL (ref 150–400)
RBC: 3.37 MIL/uL — ABNORMAL LOW (ref 3.87–5.11)
RDW: 14.9 % (ref 11.5–15.5)
Smear Review: NORMAL
WBC Count: 6.9 10*3/uL (ref 4.0–10.5)
nRBC: 0.4 % — ABNORMAL HIGH (ref 0.0–0.2)

## 2023-07-05 LAB — CMP (CANCER CENTER ONLY)
ALT: 27 U/L (ref 0–44)
AST: 16 U/L (ref 15–41)
Albumin: 3.2 g/dL — ABNORMAL LOW (ref 3.5–5.0)
Alkaline Phosphatase: 66 U/L (ref 38–126)
Anion gap: 7 (ref 5–15)
BUN: 18 mg/dL (ref 8–23)
CO2: 27 mmol/L (ref 22–32)
Calcium: 8.3 mg/dL — ABNORMAL LOW (ref 8.9–10.3)
Chloride: 109 mmol/L (ref 98–111)
Creatinine: 1.32 mg/dL — ABNORMAL HIGH (ref 0.44–1.00)
GFR, Estimated: 42 mL/min — ABNORMAL LOW (ref >60)
Glucose, Bld: 165 mg/dL — ABNORMAL HIGH (ref 70–99)
Potassium: 3.6 mmol/L (ref 3.5–5.1)
Sodium: 143 mmol/L (ref 135–145)
Total Bilirubin: 0.2 mg/dL (ref <1.2)
Total Protein: 6.7 g/dL (ref 6.5–8.1)

## 2023-07-05 MED ORDER — SODIUM CHLORIDE 0.9% FLUSH
10.0000 mL | Freq: Once | INTRAVENOUS | Status: AC
  Administered 2023-07-05: 10 mL

## 2023-07-05 MED ORDER — HEPARIN SOD (PORK) LOCK FLUSH 100 UNIT/ML IV SOLN
500.0000 [IU] | Freq: Once | INTRAVENOUS | Status: AC
  Administered 2023-07-05: 500 [IU]

## 2023-07-05 NOTE — Progress Notes (Signed)
Patient Care Team: Assunta Found, MD as PCP - General (Family Medicine) Lanelle Bal, DO as Consulting Physician (Internal Medicine)  DIAGNOSIS:  Encounter Diagnosis  Name Primary?   Malignant neoplasm of upper-outer quadrant of left breast in female, estrogen receptor positive (HCC) Yes    SUMMARY OF ONCOLOGIC HISTORY: Oncology History  Malignant neoplasm of upper-outer quadrant of left breast in female, estrogen receptor positive (HCC)  03/30/2023 Initial Diagnosis   Palpable concern left axilla, mammogram showed 0.5 cm mass left breast 1:30 position possibly an intramammary lymph node, at the site of palpable concern there was an abnormal lymph node.  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 The recommendation was for a discussion about ALND.  Dr. Pamelia Hoit plans to discuss adj chemo with her also. He is seeing her 11/4.  Looks like she is scheduled for post op appt with Dr. Carolynne Edouard 10/24.    06/06/2023 Cancer Staging   Staging form: Breast, AJCC 8th Edition - Pathologic: Stage IIA (pT2, pN1(sn), cM0, G3, ER+, PR+, HER2-) - Signed by Serena Croissant, MD on 06/06/2023 Stage prefix: Initial diagnosis Method of lymph node assessment: Sentinel lymph node biopsy Histologic grading system: 3 grade system   06/20/2023 -  Chemotherapy   Patient is on Treatment Plan : BREAST TC q21d       CHIEF COMPLIANT: Follow-up after recent hospitalization for GI bleed  HISTORY OF PRESENT ILLNESS:   History of Present Illness   The patient, with a  history of diverticulosis and breast cancer requiring chemotherapy, presents for a follow-up visit after a recent hospitalization due to a bleeding episode. The patient reports that this is not the first occurrence of bleeding, with a similar episode happening approximately three years ago. The patient attributes the previous episode to consumption of pistachios, but denies any similar trigger for the recent episode. The patient mentions eating grapes prior to the recent episode but is unsure if this could have been a trigger. The patient reports that the bleeding episode resolved on its own, as it had in the past. The patient also mentions having a cough, which she attributes to the time of year, and reports taking Claritin for this. The patient is also taking a white blood cell booster shot as part of her chemotherapy regimen.         ALLERGIES:  is allergic to lipitor [atorvastatin].  MEDICATIONS:  Current Outpatient Medications  Medication Sig Dispense Refill   amLODipine (NORVASC) 10 MG tablet Take 10 mg by mouth at bedtime.      dexamethasone (DECADRON) 4 MG tablet Take 1 tablet day before chemo and 1 tablet day after chemo with food 8 tablet 0   gabapentin (NEURONTIN) 100 MG capsule Take 100 mg by mouth as needed (nerve pain).     lidocaine-prilocaine (EMLA) cream Apply to affected area once (Patient taking differently: Apply 1 Application topically as needed (port).) 30 g 3   losartan (COZAAR) 100 MG tablet Take 100 mg by mouth daily.     metFORMIN (GLUCOPHAGE) 1000 MG tablet Take  1,000 mg by mouth 2 (two) times daily with a meal.  0   methocarbamol (ROBAXIN) 500 MG tablet Take 1 tablet (500 mg total) by mouth every 8 (eight) hours as needed for muscle spasms. (Patient taking differently: Take 500 mg by mouth as needed for muscle spasms.) 30 tablet 3   ondansetron (ZOFRAN) 8 MG tablet Take 1 tablet (8 mg total) by mouth every 8 (eight) hours as needed for nausea or vomiting. Start on the  third day after chemotherapy. 30 tablet 1   oxyCODONE (OXY IR/ROXICODONE) 5 MG immediate release tablet Take 1 tablet (5 mg total) by mouth every 6 (six) hours as needed for moderate pain (pain score 4-6). 15 tablet 0   pantoprazole (PROTONIX) 40 MG tablet Take 1 tablet (40 mg total) by mouth daily. 30 tablet 1   prochlorperazine (COMPAZINE) 10 MG tablet Take 1 tablet (10 mg total) by mouth every 6 (six) hours as needed for nausea or vomiting. 30 tablet 1   No current facility-administered medications for this visit.    PHYSICAL EXAMINATION: ECOG PERFORMANCE STATUS: 1 - Symptomatic but completely ambulatory  Vitals:   07/05/23 1236  BP: (!) 143/76  Pulse: 98  Resp: 18  Temp: 98 F (36.7 C)  SpO2: 100%   Filed Weights   07/05/23 1236  Weight: 149 lb (67.6 kg)      LABORATORY DATA:  I have reviewed the data as listed    Latest Ref Rng & Units 07/05/2023   12:22 PM 06/28/2023    5:20 AM 06/27/2023    5:25 AM  CMP  Glucose 70 - 99 mg/dL 811  914  782   BUN 8 - 23 mg/dL 18  10  10    Creatinine 0.44 - 1.00 mg/dL 9.56  2.13  0.86   Sodium 135 - 145 mmol/L 143  139  140   Potassium 3.5 - 5.1 mmol/L 3.6  3.4  3.1   Chloride 98 - 111 mmol/L 109  109  108   CO2 22 - 32 mmol/L 27  23  23    Calcium 8.9 - 10.3 mg/dL 8.3  7.9  8.0   Total Protein 6.5 - 8.1 g/dL 6.7     Total Bilirubin <1.2 mg/dL 0.2     Alkaline Phos 38 - 126 U/L 66     AST 15 - 41 U/L 16     ALT 0 - 44 U/L 27       Lab Results  Component Value Date   WBC 6.9 07/05/2023   HGB 9.3 (L) 07/05/2023   HCT 29.3 (L) 07/05/2023   MCV 86.9 07/05/2023   PLT 174 07/05/2023   NEUTROABS 4.9 07/05/2023    ASSESSMENT & PLAN:  Malignant neoplasm of upper-outer quadrant of left breast in female, estrogen receptor positive (HCC) Palpable concern left axilla, mammogram showed 0.5 cm mass left breast 1:30 position possibly an intramammary lymph node, at the site of palpable concern there was an abnormal lymph node.  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, left axillary lymph node biopsy positive, no evidence of extranodal extension, ER 100%, PR 99%, Ki-67 50%, HER2 1+ negative, stage IIa    05/06/2023: Left mastectomy: Grade 3 ILC with LCIS, 5 cm, margins negative, lymphovascular base involvement present, 3/4 lymph nodes positive, ER 100%, PR 99%, HER2 negative 1+, Ki-67 50% 06/10/2023: ALND: 0/1 lymph node   Treatment plan: Based upon tumor size greater than 5 cm and presence of 3 positive  lymph nodes being grade 3 and Ki-67 of 50% I recommended systemic chemotherapy with Taxotere and Cytoxan every 3 weeks x 4 Adjuvant radiation therapy followed by Adjuvant antiestrogen therapy --------------------------------------------------------------------------------------------------------------------------- Current treatment: Completed cycle 1 Taxotere and Cytoxan Chemo toxicities: Hospitalization 06/25/2023-06/28/2023: Acute diverticular bleed Chemotherapy-induced anemia: Hemoglobin stable at 9.3  Today's hemoglobin is 9.3 and therefore we do not need to administer any iron or blood transfusion at this time.  Will watch and monitor.  No orders of the defined types were placed in this encounter.  The patient has a good understanding of the overall plan. she agrees with it. she will call with any problems that may develop before the next visit here. Total time spent: 30 mins including face to face time and time spent for planning, charting and co-ordination of care   Tamsen Meek, MD 07/05/23

## 2023-07-11 ENCOUNTER — Inpatient Hospital Stay (HOSPITAL_BASED_OUTPATIENT_CLINIC_OR_DEPARTMENT_OTHER): Payer: Medicare HMO | Admitting: Adult Health

## 2023-07-11 ENCOUNTER — Encounter: Payer: Self-pay | Admitting: Adult Health

## 2023-07-11 ENCOUNTER — Inpatient Hospital Stay: Payer: Medicare HMO

## 2023-07-11 VITALS — BP 125/74 | HR 87 | Resp 18

## 2023-07-11 VITALS — BP 145/87 | HR 92 | Temp 99.1°F | Resp 18 | Ht 68.0 in | Wt 151.6 lb

## 2023-07-11 DIAGNOSIS — R6889 Other general symptoms and signs: Secondary | ICD-10-CM | POA: Diagnosis not present

## 2023-07-11 DIAGNOSIS — Z170 Estrogen receptor positive status [ER+]: Secondary | ICD-10-CM

## 2023-07-11 DIAGNOSIS — Z5111 Encounter for antineoplastic chemotherapy: Secondary | ICD-10-CM | POA: Diagnosis not present

## 2023-07-11 DIAGNOSIS — C50412 Malignant neoplasm of upper-outer quadrant of left female breast: Secondary | ICD-10-CM

## 2023-07-11 DIAGNOSIS — Z95828 Presence of other vascular implants and grafts: Secondary | ICD-10-CM

## 2023-07-11 DIAGNOSIS — Z79899 Other long term (current) drug therapy: Secondary | ICD-10-CM | POA: Diagnosis not present

## 2023-07-11 DIAGNOSIS — Z5189 Encounter for other specified aftercare: Secondary | ICD-10-CM | POA: Diagnosis not present

## 2023-07-11 DIAGNOSIS — E119 Type 2 diabetes mellitus without complications: Secondary | ICD-10-CM | POA: Diagnosis not present

## 2023-07-11 LAB — CBC WITH DIFFERENTIAL (CANCER CENTER ONLY)
Abs Immature Granulocytes: 0.06 10*3/uL (ref 0.00–0.07)
Basophils Absolute: 0.1 10*3/uL (ref 0.0–0.1)
Basophils Relative: 1 %
Eosinophils Absolute: 0 10*3/uL (ref 0.0–0.5)
Eosinophils Relative: 0 %
HCT: 30.4 % — ABNORMAL LOW (ref 36.0–46.0)
Hemoglobin: 9.6 g/dL — ABNORMAL LOW (ref 12.0–15.0)
Immature Granulocytes: 1 %
Lymphocytes Relative: 11 %
Lymphs Abs: 0.9 10*3/uL (ref 0.7–4.0)
MCH: 27.6 pg (ref 26.0–34.0)
MCHC: 31.6 g/dL (ref 30.0–36.0)
MCV: 87.4 fL (ref 80.0–100.0)
Monocytes Absolute: 0.2 10*3/uL (ref 0.1–1.0)
Monocytes Relative: 3 %
Neutro Abs: 6.5 10*3/uL (ref 1.7–7.7)
Neutrophils Relative %: 84 %
Platelet Count: 451 10*3/uL — ABNORMAL HIGH (ref 150–400)
RBC: 3.48 MIL/uL — ABNORMAL LOW (ref 3.87–5.11)
RDW: 15.3 % (ref 11.5–15.5)
WBC Count: 7.7 10*3/uL (ref 4.0–10.5)
nRBC: 0 % (ref 0.0–0.2)

## 2023-07-11 LAB — CMP (CANCER CENTER ONLY)
ALT: 21 U/L (ref 0–44)
AST: 15 U/L (ref 15–41)
Albumin: 3.6 g/dL (ref 3.5–5.0)
Alkaline Phosphatase: 57 U/L (ref 38–126)
Anion gap: 6 (ref 5–15)
BUN: 19 mg/dL (ref 8–23)
CO2: 26 mmol/L (ref 22–32)
Calcium: 9 mg/dL (ref 8.9–10.3)
Chloride: 105 mmol/L (ref 98–111)
Creatinine: 1.27 mg/dL — ABNORMAL HIGH (ref 0.44–1.00)
GFR, Estimated: 44 mL/min — ABNORMAL LOW (ref >60)
Glucose, Bld: 165 mg/dL — ABNORMAL HIGH (ref 70–99)
Potassium: 4.7 mmol/L (ref 3.5–5.1)
Sodium: 137 mmol/L (ref 135–145)
Total Bilirubin: 0.2 mg/dL (ref <1.2)
Total Protein: 7.5 g/dL (ref 6.5–8.1)

## 2023-07-11 MED ORDER — SODIUM CHLORIDE 0.9% FLUSH
10.0000 mL | INTRAVENOUS | Status: DC | PRN
  Administered 2023-07-11: 10 mL

## 2023-07-11 MED ORDER — SODIUM CHLORIDE 0.9% FLUSH
10.0000 mL | Freq: Once | INTRAVENOUS | Status: AC
Start: 2023-07-11 — End: 2023-07-11
  Administered 2023-07-11: 10 mL

## 2023-07-11 MED ORDER — SODIUM CHLORIDE 0.9 % IV SOLN
500.0000 mg/m2 | Freq: Once | INTRAVENOUS | Status: AC
  Administered 2023-07-11: 1000 mg via INTRAVENOUS
  Filled 2023-07-11: qty 50

## 2023-07-11 MED ORDER — SODIUM CHLORIDE 0.9 % IV SOLN: INTRAVENOUS | Status: DC

## 2023-07-11 MED ORDER — DEXAMETHASONE SODIUM PHOSPHATE 10 MG/ML IJ SOLN
10.0000 mg | Freq: Once | INTRAMUSCULAR | Status: AC
  Administered 2023-07-11: 10 mg via INTRAVENOUS
  Filled 2023-07-11: qty 1

## 2023-07-11 MED ORDER — SODIUM CHLORIDE 0.9 % IV SOLN
60.0000 mg/m2 | Freq: Once | INTRAVENOUS | Status: AC
  Administered 2023-07-11: 108 mg via INTRAVENOUS
  Filled 2023-07-11: qty 10.8

## 2023-07-11 MED ORDER — HEPARIN SOD (PORK) LOCK FLUSH 100 UNIT/ML IV SOLN
500.0000 [IU] | Freq: Once | INTRAVENOUS | Status: AC | PRN
Start: 2023-07-11 — End: 2023-07-11
  Administered 2023-07-11: 500 [IU]

## 2023-07-11 MED ORDER — PALONOSETRON HCL INJECTION 0.25 MG/5ML
0.2500 mg | Freq: Once | INTRAVENOUS | Status: AC
  Administered 2023-07-11: 0.25 mg via INTRAVENOUS
  Filled 2023-07-11: qty 5

## 2023-07-11 NOTE — Assessment & Plan Note (Signed)
Palpable concern left axilla, mammogram showed 0.5 cm mass left breast 1:30 position possibly an intramammary lymph node, at the site of palpable concern there was an abnormal lymph node.  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, left axillary lymph node biopsy positive, no evidence of extranodal extension, ER 100%, PR 99%, Ki-67 50%, HER2 1+ negative, stage IIa    05/06/2023: Left mastectomy: Grade 3 ILC with LCIS, 5 cm, margins negative, lymphovascular base involvement present, 3/4 lymph nodes positive, ER 100%, PR 99%, HER2 negative 1+, Ki-67 50% 06/10/2023: ALND: 0/1 lymph node   Treatment plan: Based upon tumor size greater than 5 cm and presence of 3 positive lymph nodes being grade 3 and Ki-67 of 50% I recommended systemic chemotherapy with Taxotere and Cytoxan every 3 weeks x 4 Adjuvant radiation therapy followed by Adjuvant antiestrogen therapy --------------------------------------------------------------------------------------------------------------------------- Current treatment: Cycle 2 Taxotere and Cytoxan Chemo toxicities: Hospitalization 06/25/2023-06/28/2023: Acute diverticular bleed Chemotherapy-induced anemia: hemoglobin is stable, monitoring Will proceed with next treatment today  Diverticulosis Recent hospitalization for diverticular bleed. Currently asymptomatic. Patient has been self-managing diet based on personal research. Suspects grape skins may have triggered the recent episode. -Provided patient with printed dietary recommendations for diverticulosis. -Encouraged patient to continue working closely with gastroenterologist.  Hyperglycemia Blood sugar slightly elevated at 165, likely due to recent dexamethasone use. -Continue current management.  Mild Renal Insufficiency Slight increase in kidney function markers, likely due to recent hospitalization. -Encourage patient to maintain adequate hydration. -Monitor kidney function.  RTC in 3  weeks for labs, f/u and cycle 3 of treatment.

## 2023-07-11 NOTE — Patient Instructions (Signed)
[facility] - A DEPT OF [facility]  Discharge Instructions: Thank you for choosing [facility] to provide your oncology and hematology care.   If you have a lab appointment with the Cancer Center, please go directly to the Cancer Center and check in at the registration area.   Wear comfortable clothing and clothing appropriate for easy access to any Portacath or PICC line.   We strive to give you quality time with your provider. You may need to reschedule your appointment if you arrive late (15 or more minutes).  Arriving late affects you and other patients whose appointments are after yours.  Also, if you miss three or more appointments without notifying the office, you may be dismissed from the clinic at the provider's discretion.      For prescription refill requests, have your pharmacy contact our office and allow 72 hours for refills to be completed.    Today you received the following chemotherapy and/or immunotherapy agents: Docetaxel, Cytoxan      To help prevent nausea and vomiting after your treatment, we encourage you to take your nausea medication as directed.  BELOW ARE SYMPTOMS THAT SHOULD BE REPORTED IMMEDIATELY: *FEVER GREATER THAN 100.4 F (38 C) OR HIGHER *CHILLS OR SWEATING *NAUSEA AND VOMITING THAT IS NOT CONTROLLED WITH YOUR NAUSEA MEDICATION *UNUSUAL SHORTNESS OF BREATH *UNUSUAL BRUISING OR BLEEDING *URINARY PROBLEMS (pain or burning when urinating, or frequent urination) *BOWEL PROBLEMS (unusual diarrhea, constipation, pain near the anus) TENDERNESS IN MOUTH AND THROAT WITH OR WITHOUT PRESENCE OF ULCERS (sore throat, sores in mouth, or a toothache) UNUSUAL RASH, SWELLING OR PAIN  UNUSUAL VAGINAL DISCHARGE OR ITCHING   Items with * indicate a potential emergency and should be followed up as soon as possible or go to the Emergency Department if any problems should occur.  Please show the CHEMOTHERAPY ALERT CARD or  IMMUNOTHERAPY ALERT CARD at check-in to the Emergency Department and triage nurse.  Should you have questions after your visit or need to cancel or reschedule your appointment, please contact [facility] - A DE[facility]  Dept: [phone]  and follow the prompts.  Office hours are 8:00 a.m. to 4:30 p.m. Monday - Friday. Please note that voicemails left after 4:00 p.m. may not be returned until the following business day.  We are closed weekends and major holidays. You have access to a nurse at all times for urgent questions. Please call the main number to the clinic Dept: [phone] and follow the prompts.   For any non-urgent questions, you may also contact your provider using MyChart. We now offer e-Visits for anyone 44 and older to request care online for non-urgent symptoms. For details visit [email or web address].   Also download the MyChart app! Go to the app store, search "MyChart", open the app, select [place], and log in with your MyChart username and password.

## 2023-07-11 NOTE — Patient Instructions (Signed)
Diverticulitis  Diverticulitis happens when poop (stool) and bacteria get trapped in small pouches in the colon called diverticula. These pouches may form if you have a condition called diverticulosis. When the poop and bacteria get trapped, it can cause an infection and inflammation. Diverticulitis may cause severe stomach pain and diarrhea. It can also lead to tissue damage in your colon. This can cause bleeding or blockage. In some cases, the diverticula may burst (rupture). This can cause infected poop to go into other parts of your abdomen. What are the causes? This condition is caused by poop getting trapped in the diverticula. This allows bacteria to grow. It can lead to inflammation and infection. What increases the risk? You are more likely to get this condition if you have diverticulosis. You are also more at risk if: You are overweight or obese. You do not get enough exercise. You drink alcohol. You smoke. You eat a lot of red meat, such as beef, pork, or lamb. You do not get enough fiber. Foods high in fiber include fruits, vegetables, beans, nuts, and whole grains. You are over 35 years of age. What are the signs or symptoms? Symptoms of this condition may include: Pain and tenderness in the abdomen. This pain is often felt on the left side but may occur in other spots. Fever and chills. Nausea and vomiting. Cramping. Bloating. Changes in how often you poop. Blood in your poop. How is this diagnosed? This condition is diagnosed based on your medical history and a physical exam. You may also have tests done to make sure there is nothing else causing your condition. These tests may include: Blood tests. Tests done on your pee (urine). A CT scan of the abdomen. You may need to have a colonoscopy. This is an exam to look at your whole large intestine. During the exam, a tube is put into the opening of your butt (anus) and then moved into your rectum, colon, and other parts of  the large intestine. This exam is done to look at the diverticula. It can also see if there is something else that may be causing your symptoms. How is this treated? Most cases are mild and can be treated at home. You may be told to: Take over-the-counter pain medicine. Only eat and drink clear liquids. Take antibiotics. Rest. More severe cases may need to be treated at a hospital. Treatment may include: Not eating or drinking. Taking pain medicines. Getting antibiotics through an IV. Getting fluids and nutrition through an IV. Surgery. Follow these instructions at home: Medicines Take over-the-counter and prescription medicines only as told by your health care provider. These include fiber supplements, probiotics, and medicines to soften your poop (stool softeners). If you were prescribed antibiotics, take them as told by your provider. Do not stop using the antibiotic even if you start to feel better. Ask your provider if the medicine prescribed to you requires you to avoid driving or using machinery. Eating and drinking  Follow the diet told by your provider. You may need to only eat and drink liquids. After your symptoms get better, you may be able to return to a more normal diet. You may be told to eat at least 25 grams (25 g) of fiber each day. Fiber makes it easier to poop. Healthy sources of fiber include: Berries. One cup has 4-8 g of fiber. Beans or lentils. One-half cup has 5-8 g of fiber. Green vegetables. One cup has 4 g of fiber. Avoid eating red meat.  General instructions Do not use any products that contain nicotine or tobacco. These products include cigarettes, chewing tobacco, and vaping devices, such as e-cigarettes. If you need help quitting, ask your provider. Exercise for at least 30 minutes, 3 times a week. Exercise hard enough to raise your heart rate and break a sweat. Contact a health care provider if: Your pain gets worse. Your pooping does not go back to  normal. Your symptoms do not get better with treatment. Your symptoms get worse all of a sudden. You have a fever. You vomit more than one time. Your poop is bloody, black, or tarry. This information is not intended to replace advice given to you by your health care provider. Make sure you discuss any questions you have with your health care provider. Document Revised: 04/15/2022 Document Reviewed: 04/15/2022 Elsevier Patient Education  2024 [employer].

## 2023-07-11 NOTE — Progress Notes (Signed)
[facility] Cancer Follow up:    Melanie Found, MD [address] [place] [address]   DIAGNOSIS:  Cancer Staging  Malignant neoplasm of upper-outer quadrant of left breast in female, estrogen receptor positive (HCC) Staging form: Breast, AJCC 8th Edition - Clinical: Stage IIA (cT1b, cN1, cM0, G3, ER+, PR+, HER2-) - Signed by Serena Croissant, MD on 04/07/2023 Histologic grading system: 3 grade system - Pathologic: Stage IIA (pT2, pN1(sn), cM0, G3, ER+, PR+, HER2-) - Signed by Serena Croissant, MD on 06/06/2023 Stage prefix: Initial diagnosis Method of lymph node assessment: Sentinel lymph node biopsy Histologic grading system: 3 grade system   SUMMARY OF ONCOLOGIC HISTORY: Oncology History  Malignant neoplasm of upper-outer quadrant of left breast in female, estrogen receptor positive (HCC)  03/30/2023 Initial Diagnosis   Palpable concern left axilla, mammogram showed 0.5 cm mass left breast 1:30 position possibly an intramammary lymph node, at the site of palpable concern there was an abnormal lymph node.  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 The recommendation was for a discussion about ALND.  Dr. Pamelia Hoit plans to discuss adj chemo with her also. He is seeing her 11/4.  Looks like she is scheduled for post op appt with Dr. Carolynne Edouard 10/24.    06/06/2023 Cancer Staging   Staging form: Breast, AJCC 8th Edition - Pathologic: Stage IIA (pT2, pN1(sn), cM0, G3, ER+, PR+, HER2-) - Signed by Serena Croissant, MD on 06/06/2023 Stage  prefix: Initial diagnosis Method of lymph node assessment: Sentinel lymph node biopsy Histologic grading system: 3 grade system   06/20/2023 -  Chemotherapy   Patient is on Treatment Plan : BREAST TC q21d       CURRENT THERAPY: Taxotere/Cytoxan  INTERVAL HISTORY: Melanie Welch 77 y.o. female returns for f/u prior to receiving her second cycle of taxotere and cytoxan.  She is doing moderately well.   [name] has a history of diverticulosis, presents for follow-up after a recent hospitalization due to a gastrointestinal bleed. She reports feeling "pretty good" since the incident. She attributes the bleed to her diet, specifically the consumption of grape skins, which she believes triggered the episode. She plans to work closely with her gastroenterologist to establish a more suitable diet, as she has been self-managing her diet based on information from books and the internet.  The patient also mentions a recent change in her medication regimen, having been prescribed dexamethasone. She reports no new symptoms or side effects from this medication. She also expresses a desire to be more proactive in her health management, seeking to understand her condition better and take steps to prevent future incidents.  She has lost her hair secondary to her chemotherapy, for which she is considering getting a wig. She expresses a preference for a dark wig with brown highlights. She also mentions a friend in [place] whom she plans to visit once her health issues are resolved.   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  MEDICAL HISTORY: Past Medical History:  Diagnosis Date   Breast cancer (HCC) 03/30/2023   Colonic adenoma 12/2018   ONE   Diabetes mellitus    Fatty liver    Hypertension    MGUS (monoclonal gammopathy of unknown significance)    Proteinuria     SURGICAL HISTORY: Past Surgical History:  Procedure Laterality Date   ABDOMINAL HYSTERECTOMY     AXILLARY LYMPH NODE DISSECTION Left 06/10/2023   Procedure: COMPLETION LEFT AXILLARY LYMPH NODE DISSECTION;  Surgeon: Griselda Miner, MD;  Location: [facility];  Service: General;  Laterality: Left;   BREAST BIOPSY  2005   left   BREAST BIOPSY Left 03/30/2023   [place] LT BREAST BX W LOC DEV EA ADD LESION IMG BX SPEC US GUIDE 03/30/2023 [facility]   BREAST BIOPSY Left 03/30/2023   MM LT BREAST BX W LOC DEV 1ST LESION IMAGE BX SPEC STEREO GUIDE 03/30/2023 [facility]   BREAST BIOPSY Left 03/30/2023   [place] LT BREAST BX W LOC DEV 1ST LESION IMG BX SPEC US GUIDE 03/30/2023 [facility]   BREAST BIOPSY Left 05/05/2023   [place] LT RADIOACTIVE SEED LOC 05/05/2023 [facility]   CATARACT EXTRACTION W/PHACO Right 05/10/2017   Procedure: CATARACT EXTRACTION PHACO AND INTRAOCULAR LENS PLACEMENT (IOC);  Surgeon: Jethro Bolus, MD;  Location: [facility];  Service: Ophthalmology;  Laterality: Right;  CDE: 5.04   CATARACT EXTRACTION W/PHACO Left 05/24/2017   Procedure: CATARACT EXTRACTION PHACO AND INTRAOCULAR LENS PLACEMENT (IOC);  Surgeon: Jethro Bolus, MD;  Location: [facility];  Service: Ophthalmology;  Laterality: Left;  CDE: 8.40   COLONOSCOPY  07/13/2011   Procedure: COLONOSCOPY;  Surgeon: Arlyce Harman, MD;  Location: [facility];  Service: Endoscopy;  Laterality: N/A;  9:30 AM    COLONOSCOPY N/A 12/09/2018   diverticulosis, suspect diverticulum in mid sigmoid s/p epi, unable to place clip. 5 mm benign polyp. Colonic lipoma. Normal TI. Many diverticula occluded with stool.    NODE DISSECTION Left 05/06/2023   Procedure: TARGETED NODE DISSECTION;  Surgeon: Griselda Miner, MD;  Location: [facility];  Service: General;  Laterality: Left;   POLYPECTOMY  12/09/2018   Procedure: POLYPECTOMY;  Surgeon: Corbin Ade, MD;  Location: [facility];  Service: Endoscopy;;  splenic flexure   PORTACATH PLACEMENT N/A 06/10/2023   Procedure: INSERTION PORT-A-CATH WITH ULTRASOUND GUIDANCE;  Surgeon: Griselda Miner, MD;  Location: [facility];  Service: General;  Laterality: N/A;   SIMPLE MASTECTOMY WITH AXILLARY SENTINEL NODE BIOPSY Left 05/06/2023   Procedure: LEFT MASTECTOMY AND SENTINEL NODE BIOPSY;  Surgeon: Griselda Miner, MD;  Location: [facility];  Service: General;  Laterality: Left;    SOCIAL HISTORY: Social History   Socioeconomic History   Marital status: Divorced    Spouse name: Not on file   Number of children: Not on file   Years of education: Not on file   Highest education level: Not on file  Occupational History  Not on file  Tobacco Use   Smoking status: Former   Smokeless tobacco: Never  Vaping Use   Vaping status: Never Used  Substance and Sexual Activity   Alcohol use: Yes    Comment: rare wine   Drug use: No   Sexual activity: Not Currently  Other Topics Concern   Not on file  Social History Narrative   Not on file   Social Determinants of Health   Financial Resource Strain: Low Risk  (04/29/2021)   Received from [facility], [facility]   Overall Financial Resource Strain (CARDIA)    Difficulty of Paying Living Expenses: Not hard at all  Food Insecurity: No Food Insecurity (06/25/2023)   Hunger Vital Sign    Worried About Running Out of Food in the Last Year: Never true    Ran Out of Food in the Last Year: Never true  Recent  Concern: Food Insecurity - Food Insecurity Present (04/12/2023)   Hunger Vital Sign    Worried About Running Out of Food in the Last Year: Sometimes true    Ran Out of Food in the Last Year: Sometimes true  Transportation Needs: No Transportation Needs (06/25/2023)   PRAPARE - Administrator, Civil Service (Medical): No    Lack of Transportation (Non-Medical): No  Physical Activity: Inactive (09/23/2021)   Received from [facility], [facility]   Exercise Vital Sign    Days of Exercise per Week: 0 days    Minutes of Exercise per Session: 0 min  Stress: No Stress Concern Present (04/29/2021)   Received from [facility], [facility] - Occupational Stress Questionnaire    Feeling of Stress : Not at all  Social Connections: Unknown (12/15/2021)   Received from [facility], [facility]   Social Network    Social Network: Not on file  Intimate Partner Violence: Not At Risk (06/25/2023)   Humiliation, Afraid, Rape, and Kick questionnaire    Fear of Current or Ex-Partner: No    Emotionally Abused: No    Physically Abused: No    Sexually Abused: No    FAMILY HISTORY: Family History  Problem Relation Age of Onset   Ovarian cancer Mother    Multiple myeloma Sister    Colon cancer Neg Hx     Review of Systems  Constitutional:  Negative for appetite change, chills, fatigue, fever and unexpected weight change.  HENT:   Negative for hearing loss, lump/mass and trouble swallowing.   Eyes:  Negative for eye problems and icterus.  Respiratory:  Negative for chest tightness, cough and shortness of breath.   Cardiovascular:  Negative for chest pain, leg swelling and palpitations.  Gastrointestinal:  Negative for abdominal distention, abdominal pain, constipation, diarrhea, nausea and vomiting.  Endocrine: Negative for hot flashes.  Genitourinary:  Negative for difficulty urinating.   Musculoskeletal:  Negative for  arthralgias.  Skin:  Negative for itching and rash.  Neurological:  Negative for dizziness, extremity weakness, headaches and numbness.  Hematological:  Negative for adenopathy. Does not bruise/bleed easily.  Psychiatric/Behavioral:  Negative for depression. The patient is not nervous/anxious.       PHYSICAL EXAMINATION   Onc Performance Status - 07/11/23 1000       KPS SCALE   KPS % SCORE Normal, no compliants, no evidence of disease             Vitals:   07/11/23 1018  BP: Marland Kitchen)  145/87  Pulse: 92  Resp: 18  Temp: 99.1 F (37.3 C)  SpO2: 99%    Physical Exam Constitutional:      General: She is not in acute distress.    Appearance: Normal appearance. She is not toxic-appearing.  HENT:     Head: Normocephalic and atraumatic.     Mouth/Throat:     Mouth: Mucous membranes are moist.     Pharynx: Oropharynx is clear. No oropharyngeal exudate or posterior oropharyngeal erythema.  Eyes:     General: No scleral icterus. Cardiovascular:     Rate and Rhythm: Normal rate and regular rhythm.     Pulses: Normal pulses.     Heart sounds: Normal heart sounds.  Pulmonary:     Effort: Pulmonary effort is normal.     Breath sounds: Normal breath sounds.  Abdominal:     General: Abdomen is flat. Bowel sounds are normal. There is no distension.     Palpations: Abdomen is soft.     Tenderness: There is no abdominal tenderness.  Musculoskeletal:        General: No swelling.     Cervical back: Neck supple.  Lymphadenopathy:     Cervical: No cervical adenopathy.  Skin:    General: Skin is warm and dry.     Findings: No rash.  Neurological:     General: No focal deficit present.     Mental Status: She is alert.  Psychiatric:        Mood and Affect: Mood normal.        Behavior: Behavior normal.     LABORATORY DATA:  CBC    Component Value Date/Time   WBC 7.7 07/11/2023 0910   WBC 23.2 (H) 06/28/2023 0520   RBC 3.48 (L) 07/11/2023 0910   HGB 9.6 (L) 07/11/2023  0910   HGB 11.7 07/14/2022 1205   HCT 30.4 (L) 07/11/2023 0910   HCT 35.5 07/14/2022 1205   PLT 451 (H) 07/11/2023 0910   PLT 219 07/14/2022 1205   MCV 87.4 07/11/2023 0910   MCV 82 07/14/2022 1205   MCH 27.6 07/11/2023 0910   MCHC 31.6 07/11/2023 0910   RDW 15.3 07/11/2023 0910   RDW 13.6 07/14/2022 1205   LYMPHSABS 0.9 07/11/2023 0910   LYMPHSABS 2.3 03/16/2019 1455   MONOABS 0.2 07/11/2023 0910   EOSABS 0.0 07/11/2023 0910   EOSABS 0.1 03/16/2019 1455   BASOSABS 0.1 07/11/2023 0910   BASOSABS 0.0 03/16/2019 1455    CMP     Component Value Date/Time   NA 137 07/11/2023 0910   NA 139 07/14/2022 1205   K 4.7 07/11/2023 0910   CL 105 07/11/2023 0910   CO2 26 07/11/2023 0910   GLUCOSE 165 (H) 07/11/2023 0910   BUN 19 07/11/2023 0910   BUN 13 07/14/2022 1205   CREATININE 1.27 (H) 07/11/2023 0910   CALCIUM 9.0 07/11/2023 0910   PROT 7.5 07/11/2023 0910   PROT 7.3 07/14/2022 1205   ALBUMIN 3.6 07/11/2023 0910   ALBUMIN 4.0 07/14/2022 1205   AST 15 07/11/2023 0910   ALT 21 07/11/2023 0910   ALKPHOS 57 07/11/2023 0910   BILITOT 0.2 07/11/2023 0910   GFRNONAA 44 (L) 07/11/2023 0910   GFRAA >60 09/03/2019 1249        ASSESSMENT and THERAPY PLAN:   Malignant neoplasm of upper-outer quadrant of left breast in female, estrogen receptor positive (HCC) Palpable concern left axilla, mammogram showed 0.5 cm mass left breast 1:30 position possibly an intramammary lymph  node, at the site of palpable concern there was an abnormal lymph node.  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, left axillary lymph node biopsy positive, no evidence of extranodal extension, ER 100%, PR 99%, Ki-67 50%, HER2 1+ negative, stage IIa    05/06/2023: Left mastectomy: Grade 3 ILC with LCIS, 5 cm, margins negative, lymphovascular base involvement present, 3/4 lymph nodes positive, ER 100%, PR 99%, HER2 negative 1+, Ki-67 50% 06/10/2023: ALND: 0/1 lymph node   Treatment  plan: Based upon tumor size greater than 5 cm and presence of 3 positive lymph nodes being grade 3 and Ki-67 of 50% I recommended systemic chemotherapy with Taxotere and Cytoxan every 3 weeks x 4 Adjuvant radiation therapy followed by Adjuvant antiestrogen therapy --------------------------------------------------------------------------------------------------------------------------- Current treatment: Cycle 2 Taxotere and Cytoxan Chemo toxicities: Hospitalization 06/25/2023-06/28/2023: Acute diverticular bleed Chemotherapy-induced anemia: hemoglobin is stable, monitoring Will proceed with next treatment today  Diverticulosis Recent hospitalization for diverticular bleed. Currently asymptomatic. Patient has been self-managing diet based on personal research. Suspects grape skins may have triggered the recent episode. -Provided patient with printed dietary recommendations for diverticulosis. -Encouraged patient to continue working closely with gastroenterologist.  Hyperglycemia Blood sugar slightly elevated at 165, likely due to recent dexamethasone use. -Continue current management.  Mild Renal Insufficiency Slight increase in kidney function markers, likely due to recent hospitalization. -Encourage patient to maintain adequate hydration. -Monitor kidney function.  RTC in 3 weeks for labs, f/u and cycle 3 of treatment.   All questions were answered. The patient knows to call the clinic with any problems, questions or concerns. We can certainly see the patient much sooner if necessary.  Total encounter time:30 minutes*in face-to-face visit time, chart review, lab review, care coordination, order entry, and documentation of the encounter time.    Lillard Anes, NP 07/11/23 1:23 PM Medical Oncology and Hematology [facility] [address] [place], [place] [address] Tel. [phone]    Fax. [phone]  *Total Encounter Time as defined by the Centers for  Medicare and Medicaid Services includes, in addition to the face-to-face time of a patient visit (documented in the note above) non-face-to-face time: obtaining and reviewing outside history, ordering and reviewing medications, tests or procedures, care coordination (communications with other health care professionals or caregivers) and documentation in the medical record.

## 2023-07-13 ENCOUNTER — Encounter: Payer: Self-pay | Admitting: Hematology and Oncology

## 2023-07-13 ENCOUNTER — Inpatient Hospital Stay: Payer: Medicare HMO

## 2023-07-13 ENCOUNTER — Other Ambulatory Visit: Payer: Self-pay

## 2023-07-13 VITALS — BP 124/78 | HR 86 | Temp 98.9°F | Resp 16

## 2023-07-13 DIAGNOSIS — Z79899 Other long term (current) drug therapy: Secondary | ICD-10-CM | POA: Diagnosis not present

## 2023-07-13 DIAGNOSIS — Z5189 Encounter for other specified aftercare: Secondary | ICD-10-CM | POA: Diagnosis not present

## 2023-07-13 DIAGNOSIS — C50412 Malignant neoplasm of upper-outer quadrant of left female breast: Secondary | ICD-10-CM

## 2023-07-13 DIAGNOSIS — Z5111 Encounter for antineoplastic chemotherapy: Secondary | ICD-10-CM | POA: Diagnosis not present

## 2023-07-13 DIAGNOSIS — E119 Type 2 diabetes mellitus without complications: Secondary | ICD-10-CM | POA: Diagnosis not present

## 2023-07-13 MED ORDER — PEGFILGRASTIM-JMDB 6 MG/0.6ML ~~LOC~~ SOSY
6.0000 mg | PREFILLED_SYRINGE | Freq: Once | SUBCUTANEOUS | Status: AC
Start: 2023-07-13 — End: 2023-07-13
  Administered 2023-07-13: 6 mg via SUBCUTANEOUS
  Filled 2023-07-13: qty 0.6

## 2023-07-13 NOTE — Therapy (Signed)
OUTPATIENT PHYSICAL THERAPY  UPPER EXTREMITY ONCOLOGY EVALUATION  Patient Name: Melanie Welch MRN: [id number] DOB:01-20-1946, 77 y.o., female Today's Date: 07/14/2023  END OF SESSION:  PT End of Session - 07/14/23 1547     Visit Number 1    Number of Visits 6    Date for PT Re-Evaluation 08/25/23    Authorization Type auth needed    PT Start Time 1400    PT Stop Time 1449    PT Time Calculation (min) 49 min    Activity Tolerance Patient tolerated treatment well    Behavior During Therapy [facility] for tasks assessed/performed             Past Medical History:  Diagnosis Date   Breast cancer (HCC) 03/30/2023   Colonic adenoma 12/2018   ONE   Diabetes mellitus    Fatty liver    Hypertension    MGUS (monoclonal gammopathy of unknown significance)    Proteinuria    Past Surgical History:  Procedure Laterality Date   ABDOMINAL HYSTERECTOMY     AXILLARY LYMPH NODE DISSECTION Left 06/10/2023   Procedure: COMPLETION LEFT AXILLARY LYMPH NODE DISSECTION;  Surgeon: Griselda Miner, MD;  Location: [facility];  Service: General;  Laterality: Left;   BREAST BIOPSY  2005   left   BREAST BIOPSY Left 03/30/2023   [place] LT BREAST BX W LOC DEV EA ADD LESION IMG BX SPEC US GUIDE 03/30/2023 [facility]   BREAST BIOPSY Left 03/30/2023   MM LT BREAST BX W LOC DEV 1ST LESION IMAGE BX SPEC STEREO GUIDE 03/30/2023 [facility]   BREAST BIOPSY Left 03/30/2023   [place] LT BREAST BX W LOC DEV 1ST LESION IMG BX SPEC US GUIDE 03/30/2023 [facility]   BREAST BIOPSY Left 05/05/2023   [place] LT RADIOACTIVE SEED LOC 05/05/2023 [facility]   CATARACT EXTRACTION W/PHACO Right 05/10/2017   Procedure: CATARACT EXTRACTION PHACO AND INTRAOCULAR LENS PLACEMENT (IOC);  Surgeon: Jethro Bolus, MD;  Location: [facility];  Service: Ophthalmology;  Laterality: Right;  CDE: 5.04   CATARACT EXTRACTION W/PHACO Left 05/24/2017   Procedure: CATARACT EXTRACTION PHACO AND INTRAOCULAR LENS PLACEMENT (IOC);   Surgeon: Jethro Bolus, MD;  Location: [facility];  Service: Ophthalmology;  Laterality: Left;  CDE: 8.40   COLONOSCOPY  07/13/2011   Procedure: COLONOSCOPY;  Surgeon: Arlyce Harman, MD;  [facility];  Service: Endoscopy;  Laterality: N/A;  9:30 AM   COLONOSCOPY N/A 12/09/2018   diverticulosis, suspect diverticulum in mid sigmoid s/p epi, unable to place clip. 5 mm benign polyp. Colonic lipoma. Normal TI. Many diverticula occluded with stool.    NODE DISSECTION Left 05/06/2023   Procedure: TARGETED NODE DISSECTION;  Surgeon: Griselda Miner, MD;  Location: [facility];  Service: General;  Laterality: Left;   POLYPECTOMY  12/09/2018   Procedure: POLYPECTOMY;  Surgeon: Corbin Ade, MD;  [facility];  Service: Endoscopy;;  splenic flexure   PORTACATH PLACEMENT N/A 06/10/2023   Procedure: INSERTION PORT-A-CATH WITH ULTRASOUND GUIDANCE;  Surgeon: Griselda Miner, MD;  Location:  SURGERY CENTER;  Service: General;  Laterality: N/A;   SIMPLE MASTECTOMY WITH AXILLARY SENTINEL NODE BIOPSY Left 05/06/2023   Procedure: LEFT MASTECTOMY AND SENTINEL NODE BIOPSY;  Surgeon: Griselda Miner, MD;  Location: [facility] OR;  Service: General;  Laterality: Left;   Patient Active Problem List   Diagnosis Date Noted   Port-A-Cath in place 07/05/2023   Cancer of left female breast (HCC) 05/06/2023  Malignant neoplasm of upper-outer quadrant of left breast in female, estrogen receptor positive (HCC) 04/07/2023   Loss of weight 12/09/2021   Loose stools 10/03/2020   Fatty liver 07/18/2019   GIB (gastrointestinal bleeding) 12/10/2018   Lower GI bleed 12/08/2018   Nontoxic multinodular goiter 05/22/2015   Diabetes mellitus without complication (HCC) 05/22/2015   Essential hypertension, benign 05/22/2015   Palpable thyroid 08/29/2014   MGUS (monoclonal gammopathy of unknown significance) 08/28/2014    PCP: Assunta Found, MD  REFERRING PROVIDER: Chevis Pretty, MD  REFERRING DIAG:  Diagnosis  C50.412  (ICD-10-CM) - Malignant neoplasm of upper-outer quadrant of left female breast    THERAPY DIAG:  No diagnosis found.  Rationale for Evaluation and Treatment: Rehabilitation  ONSET DATE: 03/30/23  Rationale for Evaluation and Treatment: Rehabilitation  SUBJECTIVE:                                                                                                                                                                                           SUBJECTIVE STATEMENT: The only thing that is hard is reaching high.    PERTINENT HISTORY: Left breast cancer, ER positive ILC with positive node on biopsy. 05/06/23: Lt mastectomy 05/06/23 with 3/4 nodes positive. 06/10/23: ALND 0/1 nodes. Having chemotherapy with Taxotere and cytoxan, then radiation. Other hx includes: kidney disease, DM, liver disease.   PAIN:  Are you having pain? No  PRECAUTIONS: lymphedema risk Left side   RED FLAGS: None   WEIGHT BEARING RESTRICTIONS: No  FALLS:  Has patient fallen in last 6 months? No  LIVING ENVIRONMENT: Lives with: lives with their family and lives alone  OCCUPATION: Retired.  I help with banking and finance if needed.    LEISURE: Gardening  HAND DOMINANCE: right   PRIOR LEVEL OF FUNCTION: Independent  PATIENT GOALS: learn what to do   OBJECTIVE: Note: Objective measures were completed at Evaluation unless otherwise noted.  COGNITION: Overall cognitive status: Within functional limits for tasks assessed   PALPATION: Possibly mild edema noted lateral incision/trunk but may also just be skin fold, 4 cords noted in axilla   OBSERVATIONS / OTHER ASSESSMENTS: port in place, well healed incision.    SENSATION: Feeling numb back of arm and around the chest   POSTURE: rounded shoulders    UPPER EXTREMITY AROM/PROM:  A/PROM RIGHT   eval   Shoulder extension 60  Shoulder flexion 125  Shoulder abduction 140  Shoulder internal rotation   Shoulder external rotation 100    (Blank  rows = not tested)  A/PROM LEFT   eval  Shoulder extension 60  Shoulder flexion 100  Shoulder abduction 90  Shoulder internal rotation   Shoulder external rotation 95    (Blank rows = not tested)  CERVICAL AROM: All within normal limits:   LYMPHEDEMA ASSESSMENTS:   LANDMARK RIGHT  eval  At axilla    15 cm proximal to olecranon process   10 cm proximal to olecranon process   Olecranon process   15 cm proximal to ulnar styloid process   10 cm proximal to ulnar styloid process   Just proximal to ulnar styloid process   Across hand at thumb web space   At base of 2nd digit   (Blank rows = not tested)  LANDMARK LEFT  eval  At axilla    15 cm proximal to olecranon process   10 cm proximal to olecranon process   Olecranon process   15 cm proximal to ulnar styloid process   10 cm proximal to ulnar styloid process   Just proximal to ulnar styloid process   Across hand at thumb web space   At base of 2nd digit   (Blank rows = not tested)  GAIT: WNL  L-DEX LYMPHEDEMA SCREENING: The patient was assessed using the L-Dex machine today to produce a lymphedema index baseline score.   L-DEX LYMPHEDEMA SCREENING Measurement Type: Unilateral L-DEX MEASUREMENT EXTREMITY: Upper Extremity POSITION : Standing DOMINANT SIDE: Right At Risk Side: Left BASELINE SCORE (UNILATERAL): -6.5 Comment: after surgery baseline  QUICK DASH SURVEY: 25%   TODAY'S TREATMENT:                                                                                                                                          DATE: 07/14/23 Eval performed Supine:  dowel flexion and hands clasped flexion x 2 - no preference between the two.    Chest stretch butterfly motion with elbows x 4 Education only on wall walking and scapular retractions Education on lymphedema and etiology, time to onset etc.  Brief PROM into flexion x 5 with education on cording throughout Gave bra script and info for second to  nature  PATIENT EDUCATION:  Education details: per today's note Person educated: Patient Education method: Programmer, multimedia, Facilities manager, Verbal cues, and Handouts Education comprehension: verbalized understanding and needs further education  HOME EXERCISE PROGRAM: Post op - supine flexion and chest stretch  ASSESSMENT:  CLINICAL IMPRESSION: Patient is a 77 y.o. female who was seen today for physical therapy evaluation and treatment after her Lt mastectomy.  She is current undergoing chemo and will have radiation after chemo.  She is feeling well so far.  She has no to minimal pain, has a well healed incision, and no signs of seroma.  She is limited in her AROM by significant cording in the axilla.  She initially wanted to do HEP at home, but due to cording she will attend in person for awhile.   OBJECTIVE IMPAIRMENTS: decreased activity tolerance, decreased knowledge of condition, decreased knowledge  of use of DME, and decreased ROM.   ACTIVITY LIMITATIONS: reach over head  PARTICIPATION LIMITATIONS: none  PERSONAL FACTORS: Age and 1 comorbidity: SLNB  are also affecting patient's functional outcome.   REHAB POTENTIAL: Excellent  CLINICAL DECISION MAKING: Stable/uncomplicated  EVALUATION COMPLEXITY: Low  GOALS: Goals reviewed with patient? Yes  SHORT TERM GOALS=LTGs: Target date: 08/24/22  Pt will improve Lt shoulder flexion and abduction to equal to the opposite side to demonstrate improved reach  Baseline: Goal status: INITIAL  2.  Pt will be ind with stretches for continued mobility  Baseline:  Goal status: INITIAL  3.  Pt will be educated on scar massage Baseline:  Goal status: MET  4. Pt will be educated on lymphedema surveillance, presentations and risk reduction  MET  PLAN:  PT FREQUENCY: 1x/week  PT DURATION: 6 weeks  PLANNED INTERVENTIONS: 32951- PT Re-evaluation, 97140- Manual therapy, Patient/Family education, Balance training, Therapeutic exercises,  and Self Care  PLAN FOR NEXT SESSION: Lt shoulder AAROM, AROM, MT for cording , do circumferences, continue Vanessa Ralphs, PT 07/14/2023, 3:59 PM

## 2023-07-14 ENCOUNTER — Encounter: Payer: Self-pay | Admitting: Rehabilitation

## 2023-07-14 ENCOUNTER — Other Ambulatory Visit: Payer: Self-pay

## 2023-07-14 ENCOUNTER — Ambulatory Visit: Payer: Medicare HMO | Attending: General Surgery | Admitting: Rehabilitation

## 2023-07-14 DIAGNOSIS — C50412 Malignant neoplasm of upper-outer quadrant of left female breast: Secondary | ICD-10-CM | POA: Diagnosis not present

## 2023-07-14 DIAGNOSIS — M25612 Stiffness of left shoulder, not elsewhere classified: Secondary | ICD-10-CM | POA: Diagnosis not present

## 2023-07-14 DIAGNOSIS — Z170 Estrogen receptor positive status [ER+]: Secondary | ICD-10-CM | POA: Diagnosis not present

## 2023-07-14 DIAGNOSIS — Z9189 Other specified personal risk factors, not elsewhere classified: Secondary | ICD-10-CM | POA: Diagnosis not present

## 2023-08-01 ENCOUNTER — Inpatient Hospital Stay: Payer: Medicare HMO

## 2023-08-01 ENCOUNTER — Encounter: Payer: Self-pay | Admitting: Adult Health

## 2023-08-01 ENCOUNTER — Inpatient Hospital Stay: Payer: Medicare HMO | Admitting: Adult Health

## 2023-08-01 VITALS — BP 129/79 | HR 86 | Resp 20

## 2023-08-01 VITALS — BP 140/69 | HR 102 | Temp 98.2°F | Resp 18 | Ht 68.0 in | Wt 155.9 lb

## 2023-08-01 DIAGNOSIS — Z79899 Other long term (current) drug therapy: Secondary | ICD-10-CM | POA: Diagnosis not present

## 2023-08-01 DIAGNOSIS — C50412 Malignant neoplasm of upper-outer quadrant of left female breast: Secondary | ICD-10-CM

## 2023-08-01 DIAGNOSIS — Z170 Estrogen receptor positive status [ER+]: Secondary | ICD-10-CM

## 2023-08-01 DIAGNOSIS — Z95828 Presence of other vascular implants and grafts: Secondary | ICD-10-CM

## 2023-08-01 DIAGNOSIS — E119 Type 2 diabetes mellitus without complications: Secondary | ICD-10-CM | POA: Diagnosis not present

## 2023-08-01 DIAGNOSIS — Z5189 Encounter for other specified aftercare: Secondary | ICD-10-CM | POA: Diagnosis not present

## 2023-08-01 DIAGNOSIS — Z5111 Encounter for antineoplastic chemotherapy: Secondary | ICD-10-CM | POA: Diagnosis not present

## 2023-08-01 LAB — CBC WITH DIFFERENTIAL (CANCER CENTER ONLY)
Abs Immature Granulocytes: 0.19 10*3/uL — ABNORMAL HIGH (ref 0.00–0.07)
Basophils Absolute: 0 10*3/uL (ref 0.0–0.1)
Basophils Relative: 0 %
Eosinophils Absolute: 0 10*3/uL (ref 0.0–0.5)
Eosinophils Relative: 0 %
HCT: 27.6 % — ABNORMAL LOW (ref 36.0–46.0)
Hemoglobin: 9 g/dL — ABNORMAL LOW (ref 12.0–15.0)
Immature Granulocytes: 2 %
Lymphocytes Relative: 7 %
Lymphs Abs: 0.9 10*3/uL (ref 0.7–4.0)
MCH: 28.5 pg (ref 26.0–34.0)
MCHC: 32.6 g/dL (ref 30.0–36.0)
MCV: 87.3 fL (ref 80.0–100.0)
Monocytes Absolute: 0.6 10*3/uL (ref 0.1–1.0)
Monocytes Relative: 5 %
Neutro Abs: 10.4 10*3/uL — ABNORMAL HIGH (ref 1.7–7.7)
Neutrophils Relative %: 86 %
Platelet Count: 322 10*3/uL (ref 150–400)
RBC: 3.16 MIL/uL — ABNORMAL LOW (ref 3.87–5.11)
RDW: 16.5 % — ABNORMAL HIGH (ref 11.5–15.5)
WBC Count: 12.1 10*3/uL — ABNORMAL HIGH (ref 4.0–10.5)
nRBC: 0 % (ref 0.0–0.2)

## 2023-08-01 LAB — CMP (CANCER CENTER ONLY)
ALT: 30 U/L (ref 0–44)
AST: 21 U/L (ref 15–41)
Albumin: 3.4 g/dL — ABNORMAL LOW (ref 3.5–5.0)
Alkaline Phosphatase: 51 U/L (ref 38–126)
Anion gap: 10 (ref 5–15)
BUN: 19 mg/dL (ref 8–23)
CO2: 24 mmol/L (ref 22–32)
Calcium: 8.7 mg/dL — ABNORMAL LOW (ref 8.9–10.3)
Chloride: 105 mmol/L (ref 98–111)
Creatinine: 1.07 mg/dL — ABNORMAL HIGH (ref 0.44–1.00)
GFR, Estimated: 53 mL/min — ABNORMAL LOW (ref >60)
Glucose, Bld: 314 mg/dL — ABNORMAL HIGH (ref 70–99)
Potassium: 3.9 mmol/L (ref 3.5–5.1)
Sodium: 139 mmol/L (ref 135–145)
Total Bilirubin: 0.2 mg/dL (ref <1.2)
Total Protein: 7 g/dL (ref 6.5–8.1)

## 2023-08-01 MED ORDER — SODIUM CHLORIDE 0.9% FLUSH
10.0000 mL | Freq: Once | INTRAVENOUS | Status: AC
Start: 2023-08-01 — End: 2023-08-01
  Administered 2023-08-01: 10 mL

## 2023-08-01 MED ORDER — HEPARIN SOD (PORK) LOCK FLUSH 100 UNIT/ML IV SOLN
500.0000 [IU] | Freq: Once | INTRAVENOUS | Status: AC | PRN
  Administered 2023-08-01: 500 [IU]

## 2023-08-01 MED ORDER — PALONOSETRON HCL INJECTION 0.25 MG/5ML
0.2500 mg | Freq: Once | INTRAVENOUS | Status: AC
  Administered 2023-08-01: 0.25 mg via INTRAVENOUS
  Filled 2023-08-01: qty 5

## 2023-08-01 MED ORDER — INSULIN ASPART 100 UNIT/ML IJ SOLN
6.0000 [IU] | Freq: Once | INTRAMUSCULAR | Status: AC
  Administered 2023-08-01: 6 [IU] via SUBCUTANEOUS
  Filled 2023-08-01: qty 1

## 2023-08-01 MED ORDER — CYCLOPHOSPHAMIDE CHEMO INJECTION 1 GM
500.0000 mg/m2 | Freq: Once | INTRAMUSCULAR | Status: AC
  Administered 2023-08-01: 1000 mg via INTRAVENOUS
  Filled 2023-08-01: qty 50

## 2023-08-01 MED ORDER — DEXAMETHASONE SODIUM PHOSPHATE 10 MG/ML IJ SOLN
4.0000 mg | Freq: Once | INTRAMUSCULAR | Status: AC
  Administered 2023-08-01: 4 mg via INTRAVENOUS
  Filled 2023-08-01: qty 1

## 2023-08-01 MED ORDER — SODIUM CHLORIDE 0.9 % IV SOLN
60.0000 mg/m2 | Freq: Once | INTRAVENOUS | Status: AC
  Administered 2023-08-01: 108 mg via INTRAVENOUS
  Filled 2023-08-01: qty 10.8

## 2023-08-01 MED ORDER — SODIUM CHLORIDE 0.9% FLUSH
10.0000 mL | INTRAVENOUS | Status: DC | PRN
Start: 2023-08-01 — End: 2023-08-01
  Administered 2023-08-01: 10 mL

## 2023-08-01 MED ORDER — SODIUM CHLORIDE 0.9 % IV SOLN
INTRAVENOUS | Status: DC
Start: 2023-08-01 — End: 2023-08-01

## 2023-08-01 NOTE — Progress Notes (Signed)
Pt. received 6 units of Novolog insulin and after approximately 45 minutes recheck CBG 208. Lillard Anes, NP notified.

## 2023-08-01 NOTE — Patient Instructions (Addendum)
[facility] - A DEPT OF [facility]  Discharge Instructions: Thank you for choosing [facility] to provide your oncology and hematology care.   If you have a lab appointment with the Cancer Center, please go directly to the Cancer Center and check in at the registration area.   Wear comfortable clothing and clothing appropriate for easy access to any Portacath or PICC line.   We strive to give you quality time with your provider. You may need to reschedule your appointment if you arrive late (15 or more minutes).  Arriving late affects you and other patients whose appointments are after yours.  Also, if you miss three or more appointments without notifying the office, you may be dismissed from the clinic at the provider's discretion.      For prescription refill requests, have your pharmacy contact our office and allow 72 hours for refills to be completed.    Today you received the following chemotherapy and/or immunotherapy agents: Docetaxel (Taxotere) and Cyclphosphamide (Cytoxan)      To help prevent nausea and vomiting after your treatment, we encourage you to take your nausea medication as directed.  BELOW ARE SYMPTOMS THAT SHOULD BE REPORTED IMMEDIATELY: *FEVER GREATER THAN 100.4 F (38 C) OR HIGHER *CHILLS OR SWEATING *NAUSEA AND VOMITING THAT IS NOT CONTROLLED WITH YOUR NAUSEA MEDICATION *UNUSUAL SHORTNESS OF BREATH *UNUSUAL BRUISING OR BLEEDING *URINARY PROBLEMS (pain or burning when urinating, or frequent urination) *BOWEL PROBLEMS (unusual diarrhea, constipation, pain near the anus) TENDERNESS IN MOUTH AND THROAT WITH OR WITHOUT PRESENCE OF ULCERS (sore throat, sores in mouth, or a toothache) UNUSUAL RASH, SWELLING OR PAIN  UNUSUAL VAGINAL DISCHARGE OR ITCHING   Items with * indicate a potential emergency and should be followed up as soon as possible or go to the Emergency Department if any problems should occur.  Please show the  CHEMOTHERAPY ALERT CARD or IMMUNOTHERAPY ALERT CARD at check-in to the Emergency Department and triage nurse.  Should you have questions after your visit or need to cancel or reschedule your appointment, please contact [facility] - A DEPT OF [facility]  Dept: [phone]  and follow the prompts.  Office hours are 8:00 a.m. to 4:30 p.m. Monday - Friday. Please note that voicemails left after 4:00 p.m. may not be returned until the following business day.  We are closed weekends and major holidays. You have access to a nurse at all times for urgent questions. Please call the main number to the clinic Dept: [phone] and follow the prompts.   For any non-urgent questions, you may also contact your provider using MyChart. We now offer e-Visits for anyone 34 and older to request care online for non-urgent symptoms. For details visit [email or web address].   Also download the MyChart app! Go to the app store, search "MyChart", open the app, select [facility], and log in with your MyChart username and password.  Insulin Aspart Injection What is this medication? INSULIN ASPART (IN su lin AS part) treats diabetes. It works by increasing insulin levels in your body, which decreases your blood sugar (glucose). It belongs to a group of medications called rapid-acting insulins. Changes to diet and exercise are often combined with this medication. This medicine may be used for other purposes; ask your health care provider or pharmacist if you have questions. COMMON BRAND NAME(S): [place], [employer], [employer], [employer], NovoLog, NovoLog [place], NovoLog PenFill What  should I tell my care team before I take this medication? They need to know if you have any of these conditions: Episodes of low blood sugar Eye disease, vision problems Kidney disease Liver disease An unusual or allergic reaction to insulin, metacresol, other medications, foods, dyes, or  preservatives Pregnant or trying to get pregnant Breast-feeding How should I use this medication? This medication is injected under the skin. Use it exactly as directed. It is important to follow the directions given to you by your care team. If you are using Novolog, you should start your meal within 5 to 10 minutes after injection. If you are using Fiasp, you should start your meal at the time of injection or within 20 minutes after injection. Have food ready before injection. Do not delay eating. You will be taught how to use this medication and how to adjust doses for activities and illness. Do not use more insulin than prescribed. Do not use it more or less often than prescribed. Always check the appearance of your insulin before using it. This insulin should be clear and colorless like water. Do not use if it is cloudy, thickened, colored, or has solid particles in it. If you use a pen, be sure to take off the outer needle cover before using the dose. It is important that you put your used needles and syringes in a special sharps container. Do not put them in a trash can. If you do not have a sharps container, call your pharmacist or care team to get one. This medication comes with INSTRUCTIONS FOR USE. Ask your pharmacist for directions on how to use this medication. Read the information carefully. Talk to your pharmacist or care team if you have questions. Talk to your care team about the use of this medication in children. While it may be prescribed for children as young as 2 years for selected conditions, precautions do apply. Overdosage: If you think you have taken too much of this medicine contact a poison control center or emergency room at once. NOTE: This medicine is only for you. Do not share this medicine with others. What if I miss a dose? It is important not to miss a dose. Your care team should discuss a plan for missed doses with you. If you do miss a dose, follow their plan. Do not  take double doses. What may interact with this medication? Some medications may affect your blood sugar levels or hide the symptoms of low blood sugar (hypoglycemia). Talk with your care team about all of the medications you take. They may suggest changes to your insulin dose or checking your blood sugar levels more often. Medications that may affect your blood sugar levels include: Alcohol Certain antibiotics, such as ciprofloxacin, levofloxacin, sulfamethoxazole; trimethoprim Certain medications for blood pressure or heart disease, such as benazepril, enalapril, lisinopril, losartan, valsartan Certain medications for mental health conditions, such as fluoxetine or olanzapine Diuretics, such as hydrochlorothiazide (HCTZ) Estrogen and progestin hormones Other medications for diabetes Steroid medications, such as prednisone or cortisone Testosterone Thyroid hormones Medications that may mask symptoms of low blood sugar include: Beta blockers, such as atenolol, metoprolol, propranolol Clonidine Guanethidine Reserpine This list may not describe all possible interactions. Give your health care provider a list of all the medicines, herbs, non-prescription drugs, or dietary supplements you use. Also tell them if you smoke, drink alcohol, or use illegal drugs. Some items may interact with your medicine. What should I watch for while using this medication? Visit your  care team for regular checks on your progress. Your care team will monitor your hemoglobin A1C. This is a simple blood test. It measures your average blood sugar level over the past 3 months. It will help you and your care team manage your diabetes. Learn how to check your blood sugar levels. Know the symptoms of low and high blood sugar and how to manage them. Always carry a source of quick sugar with you for symptoms of low blood sugar. Examples include glucose tablets, juice, or sugar candy. Teach your family members, friends, and  others how to help you if your blood sugar is too low and you are not awake enough to treat it. Talk to your care team if you have high blood sugar. You may need to adjust your insulin dose. Many factors can cause high blood sugar, including illness, stress, or a change in activity. Do not skip meals. Ask your care team if you should avoid alcohol. Many cough and cold products contain sugar or alcohol. These can affect blood sugar levels. Make sure that you have the correct syringe for the type of insulin you use. Do not change the brand or type of insulin or syringe unless your care team tells you to. Switching insulin brand or type can affect your blood sugar enough to cause serious adverse effects. Always keep an extra supply of insulin and related supplies on hand. Only use syringes once. Get rid of syringes and needles in a closed container to prevent accidental needle sticks. Do not share insulin pens or cartridges with anyone, even if the needle is changed. Each pen should only be used by one person. Sharing could cause an infection. Do not use a syringe to take insulin out of an insulin pen. Doing this may result in the wrong dose of insulin. Wear a medical ID bracelet or chain. Carry a card that describes your condition. List the medications and doses you take on the card. What side effects may I notice from receiving this medication? Side effects that you should report to your care team as soon as possible: Allergic reactions--skin rash, itching, hives, swelling of the face, lips, tongue, or throat Low blood sugar (hypoglycemia)--tremors or shaking, anxiety, sweating, cold or clammy skin, confusion, dizziness, rapid heartbeat Low potassium level--muscle pain or cramps, unusual weakness or fatigue, fast or irregular heartbeat, constipation Side effects that usually do not require medical attention (report to your care team if they continue or are bothersome): Lipodystrophy--hardening or  scarring of tissue at injection site Pain, redness, or irritation at injection site Weight gain This list may not describe all possible side effects. Call your doctor for medical advice about side effects. You may report side effects to FDA at 1-800-FDA-1088. Where should I keep my medication? Keep out of the reach of children and pets. Storage and expiration dates for different insulin products may vary. Check the label for information on how to store your insulin. Talk to your care team if you have any questions. Do not freeze. Protect from direct light and heat. Do not use insulin if it is exposed to temperatures above 37 degrees C (98.6 degrees F). Do not use insulin if it has been frozen. Fiasp multi-dose vials, Novolog multiple-dose vials, or Fiasp FlexTouch pen Unopened (not in-use): Store at room temperature up to 30 degrees C (86 degrees F) for up to 28 days, or refrigerated until the expiration date. Opened (in-use): Store at room temperature or refrigerated for up to 28 days. Fiasp  PenFill cartridges, Novolog PenFill cartridges, Novolog FlexPen, or Novolog FlexTouch Unopened (not in-use): Store at room temperature up to 30 degrees C (86 degrees F) for up to 28 days, or refrigerated until the expiration date. Opened (in-use): Store at room temperature for up to 28 days. Do not refrigerate. Fiasp PumpCart cartridges Unopened (not in-use): Store at room temperature up to 30 degrees C (86 degrees F) for up to 18 days (including 4 days in the pump), or refrigerated until the expiration date. Opened (in-use): Store at room temperature for up to 4 days. Do not refrigerate. Insulin Pump Users Change the insulin in your pump reservoir as directed by the pump user manual or the insulin label, whichever is first. To get rid of medications that are no longer needed or have expired: Take the medication to a medication take-back program. Check with your pharmacy or law enforcement to find a  location. If you cannot return the medication, ask your pharmacist or care team how to get rid of this medication safely. NOTE: This sheet is a summary. It may not cover all possible information. If you have questions about this medicine, talk to your doctor, pharmacist, or health care provider.  2024 Elsevier/Gold Standard (2022-05-17 00:00:00)

## 2023-08-01 NOTE — Progress Notes (Unsigned)
[facility] Cancer Follow up:    Assunta Found, MD [address] [place] [address]   DIAGNOSIS: Cancer Staging  Malignant neoplasm of upper-outer quadrant of left breast in female, estrogen receptor positive (HCC) Staging form: Breast, AJCC 8th Edition - Clinical: Stage IIA (cT1b, cN1, cM0, G3, ER+, PR+, HER2-) - Signed by Serena Croissant, MD on 04/07/2023 Histologic grading system: 3 grade system - Pathologic: Stage IIA (pT2, pN1(sn), cM0, G3, ER+, PR+, HER2-) - Signed by Serena Croissant, MD on 06/06/2023 Stage prefix: Initial diagnosis Method of lymph node assessment: Sentinel lymph node biopsy Histologic grading system: 3 grade system   SUMMARY OF ONCOLOGIC HISTORY: Oncology History  Malignant neoplasm of upper-outer quadrant of left breast in female, estrogen receptor positive (HCC)  03/30/2023 Initial Diagnosis   Palpable concern left axilla, mammogram showed 0.5 cm mass left breast 1:30 position possibly an intramammary lymph node, at the site of palpable concern there was an abnormal lymph node.  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 The recommendation was for a discussion about ALND.  Dr. Pamelia Hoit plans to discuss adj chemo with her also. He is seeing her 11/4.  Looks like she is scheduled for post op appt with Dr. Carolynne Edouard 10/24.    06/06/2023 Cancer Staging   Staging form: Breast, AJCC 8th Edition - Pathologic: Stage IIA (pT2, pN1(sn), cM0, G3, ER+, PR+, HER2-) - Signed by Serena Croissant, MD on 06/06/2023 Stage prefix:  Initial diagnosis Method of lymph node assessment: Sentinel lymph node biopsy Histologic grading system: 3 grade system   06/20/2023 -  Chemotherapy   Patient is on Treatment Plan : BREAST TC q21d       CURRENT THERAPY:  INTERVAL HISTORY:  Discussed the use of AI scribe software for clinical note transcription with the patient, who gave verbal consent to proceed.  Melanie Welch 77 y.o. 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  MEDICAL HISTORY: Past Medical History:  Diagnosis Date   Breast cancer (HCC) 03/30/2023   Colonic adenoma 12/2018   ONE   Diabetes mellitus    Fatty liver    Hypertension    MGUS (monoclonal gammopathy of unknown significance)    Proteinuria     SURGICAL HISTORY: Past Surgical History:  Procedure Laterality Date   ABDOMINAL HYSTERECTOMY     AXILLARY LYMPH NODE DISSECTION Left 06/10/2023   Procedure: COMPLETION LEFT AXILLARY LYMPH NODE DISSECTION;  Surgeon: Griselda Miner, MD;  Location: [facility];  Service: General;  Laterality: Left;   BREAST BIOPSY  2005   left   BREAST BIOPSY Left 03/30/2023   [place] LT BREAST BX W LOC DEV EA ADD LESION IMG BX SPEC US GUIDE 03/30/2023 [facility]   BREAST BIOPSY Left 03/30/2023   MM LT BREAST BX W LOC DEV 1ST LESION IMAGE BX SPEC  STEREO GUIDE 03/30/2023 [facility]   BREAST BIOPSY Left 03/30/2023   [place] LT BREAST BX W LOC DEV 1ST LESION IMG BX SPEC US GUIDE 03/30/2023 [facility]   BREAST BIOPSY Left 05/05/2023   [place] LT RADIOACTIVE SEED LOC 05/05/2023 [facility]   CATARACT EXTRACTION W/PHACO Right 05/10/2017   Procedure: CATARACT EXTRACTION PHACO AND INTRAOCULAR LENS PLACEMENT (IOC);  Surgeon: Jethro Bolus, MD;  Location: [facility];  Service: Ophthalmology;  Laterality: Right;  CDE: 5.04   CATARACT EXTRACTION W/PHACO Left 05/24/2017   Procedure: CATARACT EXTRACTION PHACO AND INTRAOCULAR LENS PLACEMENT (IOC);  Surgeon: Jethro Bolus, MD;  Location: [facility];  Service: Ophthalmology;  Laterality: Left;  CDE: 8.40   COLONOSCOPY  07/13/2011   Procedure: COLONOSCOPY;  Surgeon: Arlyce Harman, MD;  Location[facility];  Service: Endoscopy;  Laterality: N/A;  9:30 AM   COLONOSCOPY N/A 12/09/2018   diverticulosis, suspect diverticulum in mid sigmoid s/p epi, unable to place clip. 5 mm benign polyp. Colonic lipoma. Normal TI. Many diverticula occluded with stool.    NODE DISSECTION Left 05/06/2023   Procedure: TARGETED NODE DISSECTION;  Surgeon: Griselda Miner, MD;  Location: [facility] OR;  Service: General;  Laterality: Left;   POLYPECTOMY  12/09/2018   Procedure: POLYPECTOMY;  Surgeon: Corbin Ade, MD;  [facility];  Service: Endoscopy;;  splenic flexure   PORTACATH PLACEMENT N/A 06/10/2023   Procedure: INSERTION PORT-A-CATH WITH ULTRASOUND GUIDANCE;  Surgeon: Griselda Miner, MD;  Location: [facility];  Service: General;  Laterality: N/A;   SIMPLE MASTECTOMY WITH AXILLARY SENTINEL NODE BIOPSY Left 05/06/2023   Procedure: LEFT MASTECTOMY AND SENTINEL NODE BIOPSY;  Surgeon: Griselda Miner, MD;  Location: [facility];  Service: General;  Laterality: Left;    SOCIAL HISTORY: Social History   Socioeconomic History   Marital status: Divorced    Spouse name: Not on file   Number of children: Not on file    Years of education: Not on file   Highest education level: Not on file  Occupational History   Not on file  Tobacco Use   Smoking status: Former   Smokeless tobacco: Never  Vaping Use   Vaping status: Never Used  Substance and Sexual Activity   Alcohol use: Yes    Comment: rare wine   Drug use: No   Sexual activity: Not Currently  Other Topics Concern   Not on file  Social History Narrative   Not on file   Social Drivers of Health   Financial Resource Strain: Low Risk  (04/29/2021)   Received from [facility], [facility]   Overall Financial Resource Strain (CARDIA)    Difficulty of Paying Living Expenses: Not hard at all  Food Insecurity: No Food Insecurity (06/25/2023)   Hunger Vital Sign    Worried About Running Out of Food in the Last Year: Never true    Ran Out of Food in the Last Year: Never true  Recent Concern: Food Insecurity - Food Insecurity Present (04/12/2023)   Hunger Vital Sign  Worried About Programme researcher, broadcasting/film/video in the Last Year: Sometimes true    [employer] of Food in the Last Year: Sometimes true  Transportation Needs: No Transportation Needs (06/25/2023)   PRAPARE - Administrator, Civil Service (Medical): No    Lack of Transportation (Non-Medical): No  Physical Activity: Inactive (09/23/2021)   Received from [facility], [facility]   Exercise Vital Sign    Days of Exercise per Week: 0 days    Minutes of Exercise per Session: 0 min  Stress: No Stress Concern Present (04/29/2021)   Received from [facility], [facility] Occupational [facility] - Occupational Stress Questionnaire    Feeling of Stress : Not at all  Social Connections: Unknown (12/15/2021)   Received from [facility], [facility]   Social Network    Social Network: Not on file  Intimate Partner Violence: Not At Risk (06/25/2023)   Humiliation, Afraid, Rape, and Kick questionnaire    Fear of Current or Ex-Partner: No     Emotionally Abused: No    Physically Abused: No    Sexually Abused: No    FAMILY HISTORY: Family History  Problem Relation Age of Onset   Ovarian cancer Mother    Multiple myeloma Sister    Colon cancer Neg Hx     Review of Systems  Constitutional:  Negative for appetite change, chills, fatigue, fever and unexpected weight change.  HENT:   Negative for hearing loss, lump/mass and trouble swallowing.   Eyes:  Negative for eye problems and icterus.  Respiratory:  Negative for chest tightness, cough and shortness of breath.   Cardiovascular:  Negative for chest pain, leg swelling and palpitations.  Gastrointestinal:  Negative for abdominal distention, abdominal pain, constipation, diarrhea, nausea and vomiting.  Endocrine: Negative for hot flashes.  Genitourinary:  Negative for difficulty urinating.   Musculoskeletal:  Negative for arthralgias.  Skin:  Negative for itching and rash.  Neurological:  Negative for dizziness, extremity weakness, headaches and numbness.  Hematological:  Negative for adenopathy. Does not bruise/bleed easily.  Psychiatric/Behavioral:  Negative for depression. The patient is not nervous/anxious.       PHYSICAL EXAMINATION    Vitals:   08/01/23 0955  BP: (!) 140/69  Pulse: (!) 102  Resp: 18  Temp: 98.2 F (36.8 C)  SpO2: 99%    Physical Exam Constitutional:      General: She is not in acute distress.    Appearance: Normal appearance. She is not toxic-appearing.  HENT:     Head: Normocephalic and atraumatic.     Mouth/Throat:     Mouth: Mucous membranes are moist.     Pharynx: Oropharynx is clear. No oropharyngeal exudate or posterior oropharyngeal erythema.  Eyes:     General: No scleral icterus. Cardiovascular:     Rate and Rhythm: Normal rate and regular rhythm.     Pulses: Normal pulses.     Heart sounds: Normal heart sounds.  Pulmonary:     Effort: Pulmonary effort is normal.     Breath sounds: Normal breath sounds.  Abdominal:      General: Abdomen is flat. Bowel sounds are normal. There is no distension.     Palpations: Abdomen is soft.     Tenderness: There is no abdominal tenderness.  Musculoskeletal:        General: No swelling.     Cervical back: Neck supple.  Lymphadenopathy:     Cervical: No cervical  adenopathy.  Skin:    General: Skin is warm and dry.     Findings: No rash.  Neurological:     General: No focal deficit present.     Mental Status: She is alert.  Psychiatric:        Mood and Affect: Mood normal.        Behavior: Behavior normal.     LABORATORY DATA:  CBC    Component Value Date/Time   WBC 12.1 (H) 08/01/2023 0849   WBC 23.2 (H) 06/28/2023 0520   RBC 3.16 (L) 08/01/2023 0849   HGB 9.0 (L) 08/01/2023 0849   HGB 11.7 07/14/2022 1205   HCT 27.6 (L) 08/01/2023 0849   HCT 35.5 07/14/2022 1205   PLT 322 08/01/2023 0849   PLT 219 07/14/2022 1205   MCV 87.3 08/01/2023 0849   MCV 82 07/14/2022 1205   MCH 28.5 08/01/2023 0849   MCHC 32.6 08/01/2023 0849   RDW 16.5 (H) 08/01/2023 0849   RDW 13.6 07/14/2022 1205   LYMPHSABS 0.9 08/01/2023 0849   LYMPHSABS 2.3 03/16/2019 1455   MONOABS 0.6 08/01/2023 0849   EOSABS 0.0 08/01/2023 0849   EOSABS 0.1 03/16/2019 1455   BASOSABS 0.0 08/01/2023 0849   BASOSABS 0.0 03/16/2019 1455    CMP     Component Value Date/Time   NA 139 08/01/2023 0849   NA 139 07/14/2022 1205   K 3.9 08/01/2023 0849   CL 105 08/01/2023 0849   CO2 24 08/01/2023 0849   GLUCOSE 314 (H) 08/01/2023 0849   BUN 19 08/01/2023 0849   BUN 13 07/14/2022 1205   CREATININE 1.07 (H) 08/01/2023 0849   CALCIUM 8.7 (L) 08/01/2023 0849   PROT 7.0 08/01/2023 0849   PROT 7.3 07/14/2022 1205   ALBUMIN 3.4 (L) 08/01/2023 0849   ALBUMIN 4.0 07/14/2022 1205   AST 21 08/01/2023 0849   ALT 30 08/01/2023 0849   ALKPHOS 51 08/01/2023 0849   BILITOT 0.2 08/01/2023 0849   GFRNONAA 53 (L) 08/01/2023 0849   GFRAA >60 09/03/2019 1249        ASSESSMENT and THERAPY PLAN:    No problem-specific Assessment & Plan notes found for this encounter.    All questions were answered. The patient knows to call the clinic with any problems, questions or concerns. We can certainly see the patient much sooner if necessary.  Total encounter time:*** minutes*in face-to-face visit time, chart review, lab review, care coordination, order entry, and documentation of the encounter time.    Lillard Anes, NP 08/01/23 10:02 AM Medical Oncology and Hematology [facility] [address] [place], [place] [address] Tel. [phone]    Fax. [phone]  *Total Encounter Time as defined by the Centers for Medicare and Medicaid Services includes, in addition to the face-to-face time of a patient visit (documented in the note above) non-face-to-face time: obtaining and reviewing outside history, ordering and reviewing medications, tests or procedures, care coordination (communications with other health care professionals or caregivers) and documentation in the medical record.

## 2023-08-01 NOTE — Progress Notes (Signed)
Ok to treat with HR 102 per LC

## 2023-08-02 ENCOUNTER — Inpatient Hospital Stay: Payer: Medicare HMO | Admitting: Genetic Counselor

## 2023-08-02 ENCOUNTER — Inpatient Hospital Stay: Payer: Medicare HMO

## 2023-08-03 ENCOUNTER — Other Ambulatory Visit: Payer: Self-pay

## 2023-08-04 ENCOUNTER — Inpatient Hospital Stay: Payer: Medicare HMO | Attending: Hematology and Oncology

## 2023-08-04 ENCOUNTER — Encounter: Payer: Self-pay | Admitting: Hematology and Oncology

## 2023-08-04 VITALS — BP 138/74 | HR 87 | Temp 98.6°F | Resp 18

## 2023-08-04 DIAGNOSIS — Z5111 Encounter for antineoplastic chemotherapy: Secondary | ICD-10-CM | POA: Insufficient documentation

## 2023-08-04 DIAGNOSIS — Z5189 Encounter for other specified aftercare: Secondary | ICD-10-CM | POA: Diagnosis not present

## 2023-08-04 DIAGNOSIS — C50412 Malignant neoplasm of upper-outer quadrant of left female breast: Secondary | ICD-10-CM | POA: Diagnosis not present

## 2023-08-04 MED ORDER — PEGFILGRASTIM-JMDB 6 MG/0.6ML ~~LOC~~ SOSY
6.0000 mg | PREFILLED_SYRINGE | Freq: Once | SUBCUTANEOUS | Status: AC
  Administered 2023-08-04: 6 mg via SUBCUTANEOUS
  Filled 2023-08-04: qty 0.6

## 2023-08-04 NOTE — Assessment & Plan Note (Signed)
 Palpable concern left axilla, mammogram showed 0.5 cm mass left breast 1:30 position possibly an intramammary lymph node, at the site of palpable concern there was an abnormal lymph node.  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, left axillary lymph node biopsy positive, no evidence of extranodal extension, ER 100%, PR 99%, Ki-67 50%, HER2 1+ negative, stage IIa    05/06/2023: Left mastectomy: Grade 3 ILC with LCIS, 5 cm, margins negative, lymphovascular base involvement present, 3/4 lymph nodes positive, ER 100%, PR 99%, HER2 negative 1+, Ki-67 50% 06/10/2023: ALND: 0/1 lymph node   Treatment plan: Based upon tumor size greater than 5 cm and presence of 3 positive lymph nodes being grade 3 and Ki-67 of 50% I recommended systemic chemotherapy with Taxotere  and Cytoxan  every 3 weeks x 4 Adjuvant radiation therapy followed by Adjuvant antiestrogen therapy --------------------------------------------------------------------------------------------------------------------------- Current treatment: Cycle 3 Taxotere  and Cytoxan  Chemo toxicities: Hospitalization 06/25/2023-06/28/2023: Acute diverticular bleed Chemotherapy-induced anemia: hemoglobin is stable, monitoring Will proceed with next treatment today  Breast Cancer Undergoing third cycle of Taxotere  and Cytoxan . No significant side effects reported. Mild numbness and tingling in toes when lying down, to be monitored for potential dose adjustment if worsens. -Continue Taxotere  and Cytoxan  as planned. -Monitor for increased frequency or severity of numbness and tingling in toes.  Type 2 Diabetes Hemoglobin A1c on May 04, 2023 was 6.4, at the threshold for diabetes. Patient is on Metformin  1000mg  twice daily. Recent blood sugar significantly elevated at 314, likely due to holiday eating and steroid use. -Administer insulin  today before treatment due to significantly elevated blood sugar. -Decrease IV dexamethasone  dose  from 10mg  to 4mg  to help manage blood sugar. -Advise patient to check blood sugar three times a day for the next few days and report readings. -Advise patient on dietary modifications to manage blood sugar, including limiting carbohydrate intake and increasing protein and vegetable intake.  Steroid Use Patient is on dexamethasone  for nausea and inflammation prevention related to chemotherapy. Increased appetite likely due to steroid use. -Continue dexamethasone , but decrease IV pre-treatment dose from 10mg  to 4mg  due to elevated blood sugar. -Refill prescription for dexamethasone  to ensure patient has enough for remaining chemotherapy treatments.  General Health Maintenance -Encourage patient to maintain physical activity, including walking, to help manage blood sugar. -Plan for radiation therapy 3-4 weeks after completion of chemotherapy. -Advise patient to monitor for skin changes related to chemotherapy and radiation therapy, and to continue using coconut oil for skin care.

## 2023-08-05 DIAGNOSIS — E1165 Type 2 diabetes mellitus with hyperglycemia: Secondary | ICD-10-CM | POA: Diagnosis not present

## 2023-08-05 DIAGNOSIS — K922 Gastrointestinal hemorrhage, unspecified: Secondary | ICD-10-CM | POA: Diagnosis not present

## 2023-08-05 DIAGNOSIS — C50912 Malignant neoplasm of unspecified site of left female breast: Secondary | ICD-10-CM | POA: Diagnosis not present

## 2023-08-05 DIAGNOSIS — Z6823 Body mass index (BMI) 23.0-23.9, adult: Secondary | ICD-10-CM | POA: Diagnosis not present

## 2023-08-09 DIAGNOSIS — D4102 Neoplasm of uncertain behavior of left kidney: Secondary | ICD-10-CM | POA: Diagnosis not present

## 2023-08-10 DIAGNOSIS — C50412 Malignant neoplasm of upper-outer quadrant of left female breast: Secondary | ICD-10-CM | POA: Diagnosis not present

## 2023-08-10 DIAGNOSIS — Z170 Estrogen receptor positive status [ER+]: Secondary | ICD-10-CM | POA: Diagnosis not present

## 2023-08-10 DIAGNOSIS — Z510 Encounter for antineoplastic radiation therapy: Secondary | ICD-10-CM | POA: Diagnosis not present

## 2023-08-15 ENCOUNTER — Other Ambulatory Visit: Payer: Self-pay | Admitting: Hematology and Oncology

## 2023-08-19 ENCOUNTER — Encounter: Payer: Self-pay | Admitting: *Deleted

## 2023-08-19 DIAGNOSIS — Z170 Estrogen receptor positive status [ER+]: Secondary | ICD-10-CM

## 2023-08-22 ENCOUNTER — Inpatient Hospital Stay: Payer: Medicare HMO | Admitting: Hematology and Oncology

## 2023-08-22 ENCOUNTER — Encounter: Payer: Self-pay | Admitting: Hematology and Oncology

## 2023-08-22 ENCOUNTER — Inpatient Hospital Stay: Payer: Medicare HMO

## 2023-08-22 ENCOUNTER — Telehealth: Payer: Self-pay | Admitting: Radiation Oncology

## 2023-08-22 VITALS — HR 92

## 2023-08-22 VITALS — BP 154/73 | HR 103 | Temp 97.8°F | Resp 18 | Ht 68.0 in | Wt 151.1 lb

## 2023-08-22 DIAGNOSIS — C50412 Malignant neoplasm of upper-outer quadrant of left female breast: Secondary | ICD-10-CM

## 2023-08-22 DIAGNOSIS — Z95828 Presence of other vascular implants and grafts: Secondary | ICD-10-CM

## 2023-08-22 DIAGNOSIS — Z5111 Encounter for antineoplastic chemotherapy: Secondary | ICD-10-CM | POA: Diagnosis not present

## 2023-08-22 DIAGNOSIS — Z170 Estrogen receptor positive status [ER+]: Secondary | ICD-10-CM

## 2023-08-22 DIAGNOSIS — Z5189 Encounter for other specified aftercare: Secondary | ICD-10-CM | POA: Diagnosis not present

## 2023-08-22 LAB — CBC WITH DIFFERENTIAL (CANCER CENTER ONLY)
Abs Immature Granulocytes: 0.04 10*3/uL (ref 0.00–0.07)
Basophils Absolute: 0 10*3/uL (ref 0.0–0.1)
Basophils Relative: 1 %
Eosinophils Absolute: 0 10*3/uL (ref 0.0–0.5)
Eosinophils Relative: 0 %
HCT: 29.8 % — ABNORMAL LOW (ref 36.0–46.0)
Hemoglobin: 9.4 g/dL — ABNORMAL LOW (ref 12.0–15.0)
Immature Granulocytes: 1 %
Lymphocytes Relative: 11 %
Lymphs Abs: 0.6 10*3/uL — ABNORMAL LOW (ref 0.7–4.0)
MCH: 27.4 pg (ref 26.0–34.0)
MCHC: 31.5 g/dL (ref 30.0–36.0)
MCV: 86.9 fL (ref 80.0–100.0)
Monocytes Absolute: 0.1 10*3/uL (ref 0.1–1.0)
Monocytes Relative: 2 %
Neutro Abs: 4.6 10*3/uL (ref 1.7–7.7)
Neutrophils Relative %: 85 %
Platelet Count: 347 10*3/uL (ref 150–400)
RBC: 3.43 MIL/uL — ABNORMAL LOW (ref 3.87–5.11)
RDW: 17.1 % — ABNORMAL HIGH (ref 11.5–15.5)
WBC Count: 5.4 10*3/uL (ref 4.0–10.5)
nRBC: 0 % (ref 0.0–0.2)

## 2023-08-22 LAB — CMP (CANCER CENTER ONLY)
ALT: 22 U/L (ref 0–44)
AST: 14 U/L — ABNORMAL LOW (ref 15–41)
Albumin: 3.8 g/dL (ref 3.5–5.0)
Alkaline Phosphatase: 48 U/L (ref 38–126)
Anion gap: 10 (ref 5–15)
BUN: 19 mg/dL (ref 8–23)
CO2: 22 mmol/L (ref 22–32)
Calcium: 9.7 mg/dL (ref 8.9–10.3)
Chloride: 108 mmol/L (ref 98–111)
Creatinine: 0.96 mg/dL (ref 0.44–1.00)
GFR, Estimated: >60 mL/min (ref >60)
Glucose, Bld: 195 mg/dL — ABNORMAL HIGH (ref 70–99)
Potassium: 3.8 mmol/L (ref 3.5–5.1)
Sodium: 140 mmol/L (ref 135–145)
Total Bilirubin: 0.3 mg/dL (ref 0.0–1.2)
Total Protein: 7.6 g/dL (ref 6.5–8.1)

## 2023-08-22 MED ORDER — DOCETAXEL CHEMO INJECTION 160 MG/16ML
60.0000 mg/m2 | Freq: Once | INTRAVENOUS | Status: AC
  Administered 2023-08-22: 108 mg via INTRAVENOUS
  Filled 2023-08-22: qty 10.71

## 2023-08-22 MED ORDER — HEPARIN SOD (PORK) LOCK FLUSH 100 UNIT/ML IV SOLN
500.0000 [IU] | Freq: Once | INTRAVENOUS | Status: AC | PRN
  Administered 2023-08-22: 500 [IU]

## 2023-08-22 MED ORDER — SODIUM CHLORIDE 0.9% FLUSH
10.0000 mL | INTRAVENOUS | Status: DC | PRN
Start: 2023-08-22 — End: 2023-08-22
  Administered 2023-08-22: 10 mL

## 2023-08-22 MED ORDER — DEXAMETHASONE SODIUM PHOSPHATE 10 MG/ML IJ SOLN
4.0000 mg | Freq: Once | INTRAMUSCULAR | Status: AC
  Administered 2023-08-22: 4 mg via INTRAVENOUS
  Filled 2023-08-22: qty 1

## 2023-08-22 MED ORDER — SODIUM CHLORIDE 0.9% FLUSH
10.0000 mL | Freq: Once | INTRAVENOUS | Status: AC
  Administered 2023-08-22: 10 mL

## 2023-08-22 MED ORDER — SODIUM CHLORIDE 0.9 % IV SOLN: INTRAVENOUS | Status: DC

## 2023-08-22 MED ORDER — SODIUM CHLORIDE 0.9 % IV SOLN
500.0000 mg/m2 | Freq: Once | INTRAVENOUS | Status: AC
  Administered 2023-08-22: 1000 mg via INTRAVENOUS
  Filled 2023-08-22: qty 50

## 2023-08-22 MED ORDER — HEPARIN SOD (PORK) LOCK FLUSH 100 UNIT/ML IV SOLN: 500.0000 [IU] | Freq: Once | INTRAVENOUS | Status: DC

## 2023-08-22 MED ORDER — PALONOSETRON HCL INJECTION 0.25 MG/5ML
0.2500 mg | Freq: Once | INTRAVENOUS | Status: AC
  Administered 2023-08-22: 0.25 mg via INTRAVENOUS
  Filled 2023-08-22: qty 5

## 2023-08-22 NOTE — Patient Instructions (Signed)
 [facility] - A DEPT OF [facility]  Discharge Instructions: Thank you for choosing [facility] to provide your oncology and hematology care.   If you have a lab appointment with the Cancer Center, please go directly to the Cancer Center and check in at the registration area.   Wear comfortable clothing and clothing appropriate for easy access to any Portacath or PICC line.   We strive to give you quality time with your provider. You may need to reschedule your appointment if you arrive late (15 or more minutes).  Arriving late affects you and other patients whose appointments are after yours.  Also, if you miss three or more appointments without notifying the office, you may be dismissed from the clinic at the provider's discretion.      For prescription refill requests, have your pharmacy contact our office and allow 72 hours for refills to be completed.    Today you received the following chemotherapy and/or immunotherapy agents: Docetaxel, Cytoxan      To help prevent nausea and vomiting after your treatment, we encourage you to take your nausea medication as directed.  BELOW ARE SYMPTOMS THAT SHOULD BE REPORTED IMMEDIATELY: *FEVER GREATER THAN 100.4 F (38 C) OR HIGHER *CHILLS OR SWEATING *NAUSEA AND VOMITING THAT IS NOT CONTROLLED WITH YOUR NAUSEA MEDICATION *UNUSUAL SHORTNESS OF BREATH *UNUSUAL BRUISING OR BLEEDING *URINARY PROBLEMS (pain or burning when urinating, or frequent urination) *BOWEL PROBLEMS (unusual diarrhea, constipation, pain near the anus) TENDERNESS IN MOUTH AND THROAT WITH OR WITHOUT PRESENCE OF ULCERS (sore throat, sores in mouth, or a toothache) UNUSUAL RASH, SWELLING OR PAIN  UNUSUAL VAGINAL DISCHARGE OR ITCHING   Items with * indicate a potential emergency and should be followed up as soon as possible or go to the Emergency Department if any problems should occur.  Please show the CHEMOTHERAPY ALERT CARD or  IMMUNOTHERAPY ALERT CARD at check-in to the Emergency Department and triage nurse.  Should you have questions after your visit or need to cancel or reschedule your appointment, please contact [facility] - A DEPT OF [facility]  Dept: [phone]  and follow the prompts.  Office hours are 8:00 a.m. to 4:30 p.m. Monday - Friday. Please note that voicemails left after 4:00 p.m. may not be returned until the following business day.  We are closed weekends and major holidays. You have access to a nurse at all times for urgent questions. Please call the main number to the clinic Dept: [phone] and follow the prompts.   For any non-urgent questions, you may also contact your provider using MyChart. We now offer e-Visits for anyone 44 and older to request care online for non-urgent symptoms. For details visit [email or web address].   Also download the MyChart app! Go to the app store, search "MyChart", open the app, select [place], and log in with your MyChart username and password.

## 2023-08-22 NOTE — Progress Notes (Signed)
Dose of cytoxan 1g ok due to vial rounding.

## 2023-08-22 NOTE — Progress Notes (Signed)
Patient Care Team: Assunta Found, MD as PCP - General (Family Medicine) Lanelle Bal, DO as Consulting Physician (Internal Medicine)  DIAGNOSIS:  Encounter Diagnosis  Name Primary?   Malignant neoplasm of upper-outer quadrant of left breast in female, estrogen receptor positive (HCC) Yes    SUMMARY OF ONCOLOGIC HISTORY: Oncology History  Malignant neoplasm of upper-outer quadrant of left breast in female, estrogen receptor positive (HCC)  03/30/2023 Initial Diagnosis   Palpable concern left axilla, mammogram showed 0.5 cm mass left breast 1:30 position possibly an intramammary lymph node, at the site of palpable concern there was an abnormal lymph node.  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 The recommendation was for a discussion about ALND.  Dr. Pamelia Hoit plans to discuss adj chemo with her also. He is seeing her 11/4.  Looks like she is scheduled for post op appt with Dr. Carolynne Edouard 10/24.    06/06/2023 Cancer Staging   Staging form: Breast, AJCC 8th Edition - Pathologic: Stage IIA (pT2, pN1(sn), cM0, G3, ER+, PR+, HER2-) - Signed by Serena Croissant, MD on 06/06/2023 Stage prefix: Initial diagnosis Method of lymph node assessment: Sentinel lymph node biopsy Histologic grading system: 3 grade system   06/20/2023 -  Chemotherapy   Patient is on Treatment Plan : BREAST TC q21d       CHIEF COMPLIANT: Cycle 4 Taxotere and Cytoxan  HISTORY OF PRESENT ILLNESS:  History of Present Illness   Melanie Welch, a patient with a history of cancer,  presents for a follow-up visit to complete her final round of chemotherapy. She reports tolerating the treatment well, with no episodes of sickness, pain, or nausea throughout the course. She does note some transient numbness and tingling in the tips of her fingers and toes, which typically resolves within four to five days. Despite some fatigue, particularly after physical activity such as vacuuming, she reports feeling good overall. She has been careful to limit use of one arm, presumably the one with the port for chemotherapy, and plans to start physical therapy soon. She has been performing the exercises recommended by her physical therapist at home, including massaging of the cords.         ALLERGIES:  is allergic to lipitor [atorvastatin].  MEDICATIONS:  Current Outpatient Medications  Medication Sig Dispense Refill   amLODipine (NORVASC) 10 MG tablet Take 10 mg by mouth at bedtime.      gabapentin (NEURONTIN) 100 MG capsule Take 100 mg by mouth as needed (nerve pain). (Patient not taking: Reported on 07/11/2023)     losartan (COZAAR) 100 MG tablet Take 100 mg by mouth daily.     metFORMIN (GLUCOPHAGE) 1000 MG tablet Take 1,000 mg by mouth 2 (two) times daily with a meal.  0   methocarbamol (ROBAXIN) 500 MG tablet Take 1 tablet (500 mg total) by mouth every 8 (eight) hours as needed for muscle spasms. (Patient not taking: Reported on 08/01/2023) 30 tablet 3   No current facility-administered medications for this visit.    PHYSICAL EXAMINATION: ECOG PERFORMANCE STATUS: 1 - Symptomatic  but completely ambulatory  Vitals:   08/22/23 0857  BP: (!) 154/73  Pulse: (!) 103  Resp: 18  Temp: 97.8 F (36.6 C)  SpO2: 100%   Filed Weights   08/22/23 0857  Weight: 151 lb 1.6 oz (68.5 kg)      LABORATORY DATA:  I have reviewed the data as listed    Latest Ref Rng & Units 08/01/2023    8:49 AM 07/11/2023    9:10 AM 07/05/2023   12:22 PM  CMP  Glucose 70 - 99 mg/dL 161  096  045   BUN  8 - 23 mg/dL 19  19  18    Creatinine 0.44 - 1.00 mg/dL 4.09  8.11  9.14   Sodium 135 - 145 mmol/L 139  137  143   Potassium 3.5 - 5.1 mmol/L 3.9  4.7  3.6   Chloride 98 - 111 mmol/L 105  105  109   CO2 22 - 32 mmol/L 24  26  27    Calcium 8.9 - 10.3 mg/dL 8.7  9.0  8.3   Total Protein 6.5 - 8.1 g/dL 7.0  7.5  6.7   Total Bilirubin <1.2 mg/dL 0.2  0.2  0.2   Alkaline Phos 38 - 126 U/L 51  57  66   AST 15 - 41 U/L 21  15  16    ALT 0 - 44 U/L 30  21  27      Lab Results  Component Value Date   WBC 5.4 08/22/2023   HGB 9.4 (L) 08/22/2023   HCT 29.8 (L) 08/22/2023   MCV 86.9 08/22/2023   PLT 347 08/22/2023   NEUTROABS 4.6 08/22/2023    ASSESSMENT & PLAN:  Malignant neoplasm of upper-outer quadrant of left breast in female, estrogen receptor positive (HCC) Palpable concern left axilla, mammogram showed 0.5 cm mass left breast 1:30 position possibly an intramammary lymph node, at the site of palpable concern there was an abnormal lymph node.  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, left axillary lymph node biopsy positive, no evidence of extranodal extension, ER 100%, PR 99%, Ki-67 50%, HER2 1+ negative, stage IIa    05/06/2023: Left mastectomy: Grade 3 ILC with LCIS, 5 cm, margins negative, lymphovascular base involvement present, 3/4 lymph nodes positive, ER 100%, PR 99%, HER2 negative 1+, Ki-67 50% 06/10/2023: ALND: 0/1 lymph node   Treatment plan: Based upon tumor size greater than 5 cm and presence of 3 positive lymph nodes being grade 3 and Ki-67 of 50% I recommended systemic chemotherapy with Taxotere and Cytoxan every 3 weeks x 4 Adjuvant radiation therapy followed by Adjuvant antiestrogen therapy --------------------------------------------------------------------------------------------------------------------------- Current treatment: Cycle 4 Taxotere and Cytoxan Chemo toxicities: Hospitalization 06/25/2023-06/28/2023: Acute diverticular  bleed Chemotherapy-induced anemia: hemoglobin is stable, monitoring Will proceed with next treatment today  Diabetes type 2: A1c was 6.4 on 05/04/2023.  Currently on metformin.  Will reduce the dosage of dexamethasone given with chemotherapy.  Referral to radiation oncology Follow-up after radiation is complete to start antiestrogen therapy      No orders of the defined types were placed in this encounter.  The patient has a good understanding of the overall plan. she agrees with it. she will call with any problems that may develop before the next visit here. Total time spent: 30 mins including face to face time and time spent for planning, charting and co-ordination of care   Tamsen Meek, MD 08/22/23

## 2023-08-22 NOTE — Assessment & Plan Note (Signed)
Palpable concern left axilla, mammogram showed 0.5 cm mass left breast 1:30 position possibly an intramammary lymph node, at the site of palpable concern there was an abnormal lymph node.  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, left axillary lymph node biopsy positive, no evidence of extranodal extension, ER 100%, PR 99%, Ki-67 50%, HER2 1+ negative, stage IIa    05/06/2023: Left mastectomy: Grade 3 ILC with LCIS, 5 cm, margins negative, lymphovascular base involvement present, 3/4 lymph nodes positive, ER 100%, PR 99%, HER2 negative 1+, Ki-67 50% 06/10/2023: ALND: 0/1 lymph node   Treatment plan: Based upon tumor size greater than 5 cm and presence of 3 positive lymph nodes being grade 3 and Ki-67 of 50% I recommended systemic chemotherapy with Taxotere and Cytoxan every 3 weeks x 4 Adjuvant radiation therapy followed by Adjuvant antiestrogen therapy --------------------------------------------------------------------------------------------------------------------------- Current treatment: Cycle 4 Taxotere and Cytoxan Chemo toxicities: Hospitalization 06/25/2023-06/28/2023: Acute diverticular bleed Chemotherapy-induced anemia: hemoglobin is stable, monitoring Will proceed with next treatment today  Diabetes type 2: A1c was 6.4 on 05/04/2023.  Currently on metformin.  Will reduce the dosage of dexamethasone given with chemotherapy.  Referral to radiation oncology Follow-up after radiation is complete to start antiestrogen therapy

## 2023-08-22 NOTE — Telephone Encounter (Signed)
 Left message for patient to call back to schedule consult per 11/7 referral.

## 2023-08-23 DIAGNOSIS — R946 Abnormal results of thyroid function studies: Secondary | ICD-10-CM | POA: Diagnosis not present

## 2023-08-23 DIAGNOSIS — Z6823 Body mass index (BMI) 23.0-23.9, adult: Secondary | ICD-10-CM | POA: Diagnosis not present

## 2023-08-23 DIAGNOSIS — E559 Vitamin D deficiency, unspecified: Secondary | ICD-10-CM | POA: Diagnosis not present

## 2023-08-23 DIAGNOSIS — E538 Deficiency of other specified B group vitamins: Secondary | ICD-10-CM | POA: Diagnosis not present

## 2023-08-23 DIAGNOSIS — I10 Essential (primary) hypertension: Secondary | ICD-10-CM | POA: Diagnosis not present

## 2023-08-23 DIAGNOSIS — E782 Mixed hyperlipidemia: Secondary | ICD-10-CM | POA: Diagnosis not present

## 2023-08-23 DIAGNOSIS — K922 Gastrointestinal hemorrhage, unspecified: Secondary | ICD-10-CM | POA: Diagnosis not present

## 2023-08-24 ENCOUNTER — Inpatient Hospital Stay: Payer: Medicare HMO

## 2023-08-24 VITALS — BP 133/78 | HR 84 | Temp 98.4°F | Resp 16

## 2023-08-24 DIAGNOSIS — Z170 Estrogen receptor positive status [ER+]: Secondary | ICD-10-CM

## 2023-08-24 MED ORDER — PEGFILGRASTIM-JMDB 6 MG/0.6ML ~~LOC~~ SOSY
6.0000 mg | PREFILLED_SYRINGE | Freq: Once | SUBCUTANEOUS | Status: DC
  Filled 2023-08-24: qty 0.6

## 2023-08-25 ENCOUNTER — Encounter: Payer: Self-pay | Admitting: *Deleted

## 2023-09-07 ENCOUNTER — Other Ambulatory Visit: Payer: Self-pay

## 2023-09-13 ENCOUNTER — Encounter: Payer: Self-pay | Admitting: *Deleted

## 2023-09-13 ENCOUNTER — Ambulatory Visit
Admission: RE | Admit: 2023-09-13 | Discharge: 2023-09-13 | Disposition: A | Discharge status: Home / Self Care | Payer: Medicare HMO | Source: Ambulatory Visit | Attending: Radiation Oncology | Admitting: Radiation Oncology

## 2023-09-13 ENCOUNTER — Encounter: Payer: Self-pay | Admitting: Radiation Oncology

## 2023-09-13 VITALS — BP 137/92 | HR 85 | Temp 97.3°F | Resp 18 | Ht 68.0 in | Wt 150.0 lb

## 2023-09-13 DIAGNOSIS — C50412 Malignant neoplasm of upper-outer quadrant of left female breast: Secondary | ICD-10-CM | POA: Insufficient documentation

## 2023-09-13 DIAGNOSIS — Z79899 Other long term (current) drug therapy: Secondary | ICD-10-CM | POA: Diagnosis not present

## 2023-09-13 DIAGNOSIS — Z8041 Family history of malignant neoplasm of ovary: Secondary | ICD-10-CM | POA: Insufficient documentation

## 2023-09-13 DIAGNOSIS — Z87891 Personal history of nicotine dependence: Secondary | ICD-10-CM | POA: Diagnosis not present

## 2023-09-13 DIAGNOSIS — C773 Secondary and unspecified malignant neoplasm of axilla and upper limb lymph nodes: Secondary | ICD-10-CM | POA: Insufficient documentation

## 2023-09-13 DIAGNOSIS — Z510 Encounter for antineoplastic radiation therapy: Secondary | ICD-10-CM | POA: Insufficient documentation

## 2023-09-13 DIAGNOSIS — I10 Essential (primary) hypertension: Secondary | ICD-10-CM | POA: Diagnosis not present

## 2023-09-13 DIAGNOSIS — Z170 Estrogen receptor positive status [ER+]: Secondary | ICD-10-CM | POA: Insufficient documentation

## 2023-09-13 DIAGNOSIS — K760 Fatty (change of) liver, not elsewhere classified: Secondary | ICD-10-CM | POA: Insufficient documentation

## 2023-09-13 DIAGNOSIS — Z9012 Acquired absence of left breast and nipple: Secondary | ICD-10-CM | POA: Diagnosis not present

## 2023-09-13 DIAGNOSIS — D472 Monoclonal gammopathy: Secondary | ICD-10-CM | POA: Diagnosis not present

## 2023-09-13 DIAGNOSIS — Z7984 Long term (current) use of oral hypoglycemic drugs: Secondary | ICD-10-CM | POA: Insufficient documentation

## 2023-09-13 DIAGNOSIS — E119 Type 2 diabetes mellitus without complications: Secondary | ICD-10-CM | POA: Diagnosis not present

## 2023-09-13 NOTE — Progress Notes (Signed)
Radiation Oncology         [phone]) [phone] ________________________________  Name: Melanie Welch        MRN: [id number]  Date of Service: 09/13/2023 DOB: 07/03/1946  [id number], Jonny Ruiz, MD  Serena Croissant, MD     REFERRING PHYSICIAN: Serena Croissant, MD   DIAGNOSIS: The encounter diagnosis was Malignant neoplasm of upper-outer quadrant of left breast in female, estrogen receptor positive (HCC).   HISTORY OF PRESENT ILLNESS: Melanie Welch is a 78 y.o. female with a diagnosis of left breast cancer. The patient presented with a palpable abnormality in the left axillary region.  Further diagnostic workup showed a mass in the left breast in the 1:30 position that was possibly an intramammary lymph node as well as a palpable abnormal lymph node in the axilla.  She underwent a biopsy on 03/30/2023 that showed invasive lobular carcinoma in the 130 specimen that was grade 3, and similar findings of grade 3 invasive lobular carcinoma were seen in the second specimen of the left breast.  Metastatic carcinoma was noted to the left axillary lymph node, and prognostics on the 1:30 o'clock lesion showed that the tumor was ER/PR positive HER2 negative with a Ki-67 of 50%. An MRI scan for extent of disease was performed on 04/09/2023, this showed 6.4 cm on non mass enhancement but no new findings in the left breast/axilla or right breast/axilla.   She underwent biopsies of the non mass enhancement on 04/14/23. The upper central biopsy marked with a barbell clip showed grade 3 invasive lobular carcinoma with LCIS. The posterior central (marked with a cylinder clip) showed a grade 3 invasive lobular carcinoma with intermediate grade LCIS.   She underwent a left mastectomy with sentinel node biopsy on 05/06/23 which showed an invasive and in situ lobular carcinoma, the largest measuring 5 cm. Margins were negative, and one node in the specimen was negative for carcinoma. The targeted left axillary node specimen showed metastatic  carcinoma measuring 1.8 cm. Two additional sentinel excision specimens showed metastatic carcinoma measuring 1.3 cm, and 1.8 cm, and the left medial skin edge excision was benign. She underwent completion of left axillary node dissection on 06/10/23 and one node in the contents showed a benign node. Given the T3 tumor and nodal involvement, she was offered Taxotere and Cytoxan chemotherapy which she completed between 06/20/23 and 08/22/23. She was hospitalized in November with a diverticular bleed. She recovered and completed her chemotherapy. She's seen today to discuss adjuvant radiotherapy.    PREVIOUS RADIATION THERAPY: No   PAST MEDICAL HISTORY:  Past Medical History:  Diagnosis Date   Breast cancer (HCC) 03/30/2023   Colonic adenoma 12/2018   ONE   Diabetes mellitus    Fatty liver    Hypertension    MGUS (monoclonal gammopathy of unknown significance)    Proteinuria        PAST SURGICAL HISTORY: Past Surgical History:  Procedure Laterality Date   ABDOMINAL HYSTERECTOMY     AXILLARY LYMPH NODE DISSECTION Left 06/10/2023   Procedure: COMPLETION LEFT AXILLARY LYMPH NODE DISSECTION;  Surgeon: Griselda Miner, MD;  Location: [facility];  Service: General;  Laterality: Left;   BREAST BIOPSY  2005   left   BREAST BIOPSY Left 03/30/2023   [place] LT BREAST BX W LOC DEV EA ADD LESION IMG BX SPEC US GUIDE 03/30/2023 [facility]   BREAST BIOPSY Left 03/30/2023   MM LT BREAST BX W LOC DEV 1ST LESION IMAGE BX SPEC STEREO GUIDE  03/30/2023 [facility]   BREAST BIOPSY Left 03/30/2023   [place] LT BREAST BX W LOC DEV 1ST LESION IMG BX SPEC US GUIDE 03/30/2023 [facility]   BREAST BIOPSY Left 05/05/2023   [place] LT RADIOACTIVE SEED LOC 05/05/2023 [facility]   CATARACT EXTRACTION W/PHACO Right 05/10/2017   Procedure: CATARACT EXTRACTION PHACO AND INTRAOCULAR LENS PLACEMENT (IOC);  Surgeon: Jethro Bolus, MD;  Location: [facility];  Service: Ophthalmology;  Laterality: Right;   CDE: 5.04   CATARACT EXTRACTION W/PHACO Left 05/24/2017   Procedure: CATARACT EXTRACTION PHACO AND INTRAOCULAR LENS PLACEMENT (IOC);  Surgeon: Jethro Bolus, MD;  Location: [facility];  Service: Ophthalmology;  Laterality: Left;  CDE: 8.40   COLONOSCOPY  07/13/2011   Procedure: COLONOSCOPY;  Surgeon: Arlyce Harman, MD;  Location: [facility];  Service: Endoscopy;  Laterality: N/A;  9:30 AM   COLONOSCOPY N/A 12/09/2018   diverticulosis, suspect diverticulum in mid sigmoid s/p epi, unable to place clip. 5 mm benign polyp. Colonic lipoma. Normal TI. Many diverticula occluded with stool.    NODE DISSECTION Left 05/06/2023   Procedure: TARGETED NODE DISSECTION;  Surgeon: Griselda Miner, MD;  [facility];  Service: General;  Laterality: Left;   POLYPECTOMY  12/09/2018   Procedure: POLYPECTOMY;  Surgeon: Corbin Ade, MD;  Location: [facility];  Service: Endoscopy;;  splenic flexure   PORTACATH PLACEMENT N/A 06/10/2023   Procedure: INSERTION PORT-A-CATH WITH ULTRASOUND GUIDANCE;  Surgeon: Griselda Miner, MD;  Location: [facility];  Service: General;  Laterality: N/A;   SIMPLE MASTECTOMY WITH AXILLARY SENTINEL NODE BIOPSY Left 05/06/2023   Procedure: LEFT MASTECTOMY AND SENTINEL NODE BIOPSY;  Surgeon: Griselda Miner, MD;  Location: [facility];  Service: General;  Laterality: Left;     FAMILY HISTORY:  Family History  Problem Relation Age of Onset   Ovarian cancer Mother    Multiple myeloma Sister    Colon cancer Neg Hx      SOCIAL HISTORY:  reports that she has quit smoking. She has never used smokeless tobacco. She reports current alcohol use. She reports that she does not use drugs. The patient is divorced and lives in [place].    ALLERGIES: Lipitor [atorvastatin]   MEDICATIONS:  Current Outpatient Medications  Medication Sig Dispense Refill   amLODipine (NORVASC) 10 MG tablet Take 10 mg by mouth at bedtime.      gabapentin (NEURONTIN) 100 MG capsule Take 100 mg by mouth as  needed (nerve pain). (Patient not taking: Reported on 07/11/2023)     losartan (COZAAR) 100 MG tablet Take 100 mg by mouth daily.     metFORMIN (GLUCOPHAGE) 1000 MG tablet Take 1,000 mg by mouth 2 (two) times daily with a meal.  0   methocarbamol (ROBAXIN) 500 MG tablet Take 1 tablet (500 mg total) by mouth every 8 (eight) hours as needed for muscle spasms. (Patient not taking: Reported on 08/01/2023) 30 tablet 3   No current facility-administered medications for this encounter.     REVIEW OF SYSTEMS: On review of systems, the patient reports that she is doing well and her bowel function has returned to baseline. She is drinking Kefir yogurt which she feels has helped her. She reports energy is slowly returning since her last chemo treatment. No other complaints are verbalized.       PHYSICAL EXAM:  Wt Readings from Last 3 Encounters:  09/13/23 150 lb (68 kg)  08/22/23 151 lb 1.6 oz (68.5 kg)  08/01/23 155 lb  14.4 oz (70.7 kg)   Temp Readings from Last 3 Encounters:  09/13/23 (!) 97.3 F (36.3 C) (Temporal)  08/24/23 98.4 F (36.9 C) (Oral)  08/22/23 97.8 F (36.6 C) (Temporal)   BP Readings from Last 3 Encounters:  09/13/23 (!) 167/93  08/24/23 133/78  08/22/23 (!) 154/73   Pulse Readings from Last 3 Encounters:  09/13/23 100  08/24/23 84  08/22/23 92    In general this is a well appearing African American  female in no acute distress. She's alert and oriented x4 and appropriate throughout the examination. Cardiopulmonary assessment is negative for acute distress and she exhibits normal effort. The left chest wall reveals a well healed surgical incision site. No erythema, separation, or drainage is noted.     ECOG = 0  0 - Asymptomatic (Fully active, able to carry on all predisease activities without restriction)  1 - Symptomatic but completely ambulatory (Restricted in physically strenuous activity but ambulatory and able to carry out work of a light or sedentary  nature. For example, light housework, office work)  2 - Symptomatic, <50% in bed during the day (Ambulatory and capable of all self care but unable to carry out any work activities. Up and about more than 50% of waking hours)  3 - Symptomatic, >50% in bed, but not bedbound (Capable of only limited self-care, confined to bed or chair 50% or more of waking hours)  4 - Bedbound (Completely disabled. Cannot carry on any self-care. Totally confined to bed or chair)  5 - Death   Santiago Glad MM, Creech RH, Tormey DC, et al. [phone]). "Toxicity and response criteria of the [facility]". Am. Evlyn Clines. Oncol. 5 (6): 649-55    LABORATORY DATA:  Lab Results  Component Value Date   WBC 5.4 08/22/2023   HGB 9.4 (L) 08/22/2023   HCT 29.8 (L) 08/22/2023   MCV 86.9 08/22/2023   PLT 347 08/22/2023   Lab Results  Component Value Date   NA 140 08/22/2023   K 3.8 08/22/2023   CL 108 08/22/2023   CO2 22 08/22/2023   Lab Results  Component Value Date   ALT 22 08/22/2023   AST 14 (L) 08/22/2023   ALKPHOS 48 08/22/2023   BILITOT 0.3 08/22/2023      RADIOGRAPHY: No results found.     IMPRESSION/PLAN: 1. Stage IIB, pT3N1M0 grade 3, ER/PR positive invasive lobular carcinoma of the left breast. Dr. Mitzi Hansen has reviewed the final pathology findings and today she and I reviewed the nature of left node positive breast disease.  The patient has done well since surgery, and has completed chemotherapy. She is now ready to proceed with adjuvant radiation to the left breast and regional nodes to reduce risks of local recurrence. We discussed the risks, benefits, short, and long term effects of radiotherapy, as well as the curative intent, and the patient is in agreement to proceed. We reviewed the delivery and logistics of radiotherapy and anticipates a course of 6-1/2 weeks of radiotherapy to the left chest wall and regional lymph nodes. Written consent is obtained and placed in the chart, a copy  was provided to the patient. She will simulate today and start radiation the week of 09/26/23. 2. Possible genetic predisposition to malignancy. The patient is a candidate for genetic testing given her personal and family history. She is meeting with them next week. 3. Hypertension. The patient's repeat BP was 137/92 after discussion today, she will continue her current antihypertensives and keep track  of any elevations to report to her PCP.      In a visit lasting 45 minutes, greater than 50% of the time was spent face to face discussing the patient's condition, in preparation for the discussion, and coordinating the patient's care.     Osker Mason, [facility]    **Disclaimer: This note was dictated with voice recognition software. Similar sounding words can inadvertently be transcribed and this note may contain transcription errors which may not have been corrected upon publication of note.**

## 2023-09-13 NOTE — Progress Notes (Signed)
Nursing interview for Malignant neoplasm of upper-outer quadrant of left breast in female, estrogen receptor positive (HCC).   Patient identity verified x2.  Patient reports incision line healing well and doing good overall. Patient denies chest/ arm pain, sob and any other related issues at this time.  Meaningful use complete.  Vitals- BP (!) 167/93 (BP Location: Right Arm, Patient Position: Sitting, Cuff Size: Normal)   Pulse 100   Temp (!) 97.3 F (36.3 C) (Temporal)   Resp 18   Ht 5\' 8"  (1.727 m)   Wt 150 lb (68 kg)   SpO2 100%   BMI 22.81 kg/m   This concludes the interaction.  Ruel Favors, LPN

## 2023-09-14 ENCOUNTER — Other Ambulatory Visit: Payer: Self-pay

## 2023-09-14 DIAGNOSIS — C50412 Malignant neoplasm of upper-outer quadrant of left female breast: Secondary | ICD-10-CM | POA: Diagnosis not present

## 2023-09-19 ENCOUNTER — Encounter: Payer: Self-pay | Admitting: *Deleted

## 2023-09-19 DIAGNOSIS — C50412 Malignant neoplasm of upper-outer quadrant of left female breast: Secondary | ICD-10-CM

## 2023-09-22 ENCOUNTER — Inpatient Hospital Stay: Payer: Medicare HMO | Attending: Hematology and Oncology | Admitting: Genetic Counselor

## 2023-09-22 ENCOUNTER — Other Ambulatory Visit: Payer: Medicare HMO

## 2023-09-22 ENCOUNTER — Telehealth: Payer: Self-pay | Admitting: Genetic Counselor

## 2023-09-22 NOTE — Telephone Encounter (Signed)
 Called patient at time of virtual genetics appt (mobile phone x2 and home phone x1).  No answer and unable to LVMs.

## 2023-09-26 DIAGNOSIS — Z170 Estrogen receptor positive status [ER+]: Secondary | ICD-10-CM | POA: Diagnosis not present

## 2023-09-26 DIAGNOSIS — C50412 Malignant neoplasm of upper-outer quadrant of left female breast: Secondary | ICD-10-CM | POA: Diagnosis not present

## 2023-09-26 DIAGNOSIS — Z510 Encounter for antineoplastic radiation therapy: Secondary | ICD-10-CM | POA: Diagnosis not present

## 2023-09-27 ENCOUNTER — Other Ambulatory Visit: Payer: Self-pay

## 2023-09-27 ENCOUNTER — Ambulatory Visit
Admission: RE | Admit: 2023-09-27 | Discharge: 2023-09-27 | Disposition: A | Discharge status: Home / Self Care | Payer: Medicare HMO | Source: Ambulatory Visit | Attending: Radiation Oncology | Admitting: Radiation Oncology

## 2023-09-27 DIAGNOSIS — C50412 Malignant neoplasm of upper-outer quadrant of left female breast: Secondary | ICD-10-CM | POA: Diagnosis not present

## 2023-09-27 DIAGNOSIS — Z170 Estrogen receptor positive status [ER+]: Secondary | ICD-10-CM | POA: Diagnosis not present

## 2023-09-27 DIAGNOSIS — Z510 Encounter for antineoplastic radiation therapy: Secondary | ICD-10-CM | POA: Diagnosis not present

## 2023-09-27 LAB — RAD ONC ARIA SESSION SUMMARY

## 2023-09-28 ENCOUNTER — Other Ambulatory Visit: Payer: Self-pay

## 2023-09-28 ENCOUNTER — Ambulatory Visit
Admission: RE | Admit: 2023-09-28 | Discharge: 2023-09-28 | Disposition: A | Discharge status: Home / Self Care | Payer: Medicare HMO | Source: Ambulatory Visit | Attending: Radiation Oncology | Admitting: Radiation Oncology

## 2023-09-28 DIAGNOSIS — C50412 Malignant neoplasm of upper-outer quadrant of left female breast: Secondary | ICD-10-CM | POA: Diagnosis not present

## 2023-09-28 DIAGNOSIS — Z510 Encounter for antineoplastic radiation therapy: Secondary | ICD-10-CM | POA: Diagnosis not present

## 2023-09-28 DIAGNOSIS — Z170 Estrogen receptor positive status [ER+]: Secondary | ICD-10-CM | POA: Diagnosis not present

## 2023-09-28 LAB — RAD ONC ARIA SESSION SUMMARY

## 2023-09-29 ENCOUNTER — Ambulatory Visit
Admission: RE | Admit: 2023-09-29 | Discharge: 2023-09-29 | Disposition: A | Discharge status: Home / Self Care | Payer: Medicare HMO | Source: Ambulatory Visit | Attending: Radiation Oncology | Admitting: Radiation Oncology

## 2023-09-29 ENCOUNTER — Other Ambulatory Visit: Payer: Self-pay

## 2023-09-29 DIAGNOSIS — Z510 Encounter for antineoplastic radiation therapy: Secondary | ICD-10-CM | POA: Diagnosis not present

## 2023-09-29 DIAGNOSIS — Z170 Estrogen receptor positive status [ER+]: Secondary | ICD-10-CM | POA: Diagnosis not present

## 2023-09-29 DIAGNOSIS — C50412 Malignant neoplasm of upper-outer quadrant of left female breast: Secondary | ICD-10-CM | POA: Diagnosis not present

## 2023-09-29 LAB — RAD ONC ARIA SESSION SUMMARY
Course Elapsed Days: 2
Plan Fractions Treated to Date: 2
Plan Prescribed Dose Per Fraction: 1.8 Gy
Plan Total Fractions Prescribed: 14
Plan Total Prescribed Dose: 25.2 Gy
Reference Point Dosage Given to Date: 3.6 Gy
Reference Point Session Dosage Given: 1.8 Gy
Session Number: 3

## 2023-09-30 ENCOUNTER — Ambulatory Visit
Admission: RE | Admit: 2023-09-30 | Discharge: 2023-09-30 | Disposition: A | Discharge status: Home / Self Care | Payer: Medicare HMO | Source: Ambulatory Visit | Attending: Radiation Oncology | Admitting: Radiation Oncology

## 2023-09-30 ENCOUNTER — Encounter: Payer: Self-pay | Admitting: Hematology and Oncology

## 2023-09-30 ENCOUNTER — Other Ambulatory Visit: Payer: Self-pay

## 2023-09-30 DIAGNOSIS — C50412 Malignant neoplasm of upper-outer quadrant of left female breast: Secondary | ICD-10-CM | POA: Diagnosis not present

## 2023-09-30 DIAGNOSIS — Z170 Estrogen receptor positive status [ER+]: Secondary | ICD-10-CM | POA: Diagnosis not present

## 2023-09-30 DIAGNOSIS — Z510 Encounter for antineoplastic radiation therapy: Secondary | ICD-10-CM | POA: Diagnosis not present

## 2023-09-30 LAB — RAD ONC ARIA SESSION SUMMARY
Course Elapsed Days: 3
Plan Fractions Treated to Date: 2
Plan Fractions Treated to Date: 4
Plan Prescribed Dose Per Fraction: 1.8 Gy
Plan Prescribed Dose Per Fraction: 1.8 Gy
Plan Total Fractions Prescribed: 14
Plan Total Fractions Prescribed: 28
Plan Total Prescribed Dose: 25.2 Gy
Plan Total Prescribed Dose: 50.4 Gy
Reference Point Dosage Given to Date: 3.6 Gy
Reference Point Dosage Given to Date: 7.2 Gy
Reference Point Session Dosage Given: 1.8 Gy
Reference Point Session Dosage Given: 1.8 Gy
Session Number: 4

## 2023-09-30 MED ORDER — ALRA NON-METALLIC DEODORANT (RAD-ONC)
1.0000 | Freq: Once | TOPICAL | Status: AC
  Administered 2023-09-30: 1 via TOPICAL

## 2023-09-30 MED ORDER — RADIAPLEXRX EX GEL: Freq: Once | CUTANEOUS | Status: AC

## 2023-10-03 ENCOUNTER — Other Ambulatory Visit: Payer: Self-pay

## 2023-10-03 ENCOUNTER — Ambulatory Visit
Admission: RE | Admit: 2023-10-03 | Discharge: 2023-10-03 | Disposition: A | Discharge status: Home / Self Care | Payer: Medicare HMO | Source: Ambulatory Visit | Attending: Radiation Oncology | Admitting: Radiation Oncology

## 2023-10-03 DIAGNOSIS — C50412 Malignant neoplasm of upper-outer quadrant of left female breast: Secondary | ICD-10-CM | POA: Diagnosis not present

## 2023-10-03 DIAGNOSIS — Z170 Estrogen receptor positive status [ER+]: Secondary | ICD-10-CM | POA: Diagnosis not present

## 2023-10-03 DIAGNOSIS — Z510 Encounter for antineoplastic radiation therapy: Secondary | ICD-10-CM | POA: Diagnosis not present

## 2023-10-03 LAB — RAD ONC ARIA SESSION SUMMARY
Course Elapsed Days: 6
Plan Fractions Treated to Date: 3
Plan Fractions Treated to Date: 5
Plan Prescribed Dose Per Fraction: 1.8 Gy
Plan Prescribed Dose Per Fraction: 1.8 Gy
Plan Total Fractions Prescribed: 14
Plan Total Fractions Prescribed: 28
Plan Total Prescribed Dose: 25.2 Gy
Plan Total Prescribed Dose: 50.4 Gy
Reference Point Dosage Given to Date: 5.4 Gy
Reference Point Dosage Given to Date: 9 Gy
Reference Point Session Dosage Given: 1.8 Gy
Reference Point Session Dosage Given: 1.8 Gy
Session Number: 5

## 2023-10-04 ENCOUNTER — Other Ambulatory Visit: Payer: Self-pay

## 2023-10-04 ENCOUNTER — Ambulatory Visit
Admission: RE | Admit: 2023-10-04 | Discharge: 2023-10-04 | Disposition: A | Discharge status: Home / Self Care | Payer: Medicare HMO | Source: Ambulatory Visit | Attending: Radiation Oncology

## 2023-10-04 DIAGNOSIS — Z170 Estrogen receptor positive status [ER+]: Secondary | ICD-10-CM | POA: Diagnosis not present

## 2023-10-04 DIAGNOSIS — C50412 Malignant neoplasm of upper-outer quadrant of left female breast: Secondary | ICD-10-CM | POA: Diagnosis not present

## 2023-10-04 DIAGNOSIS — Z510 Encounter for antineoplastic radiation therapy: Secondary | ICD-10-CM | POA: Diagnosis not present

## 2023-10-04 LAB — RAD ONC ARIA SESSION SUMMARY
Course Elapsed Days: 7
Plan Fractions Treated to Date: 3
Plan Fractions Treated to Date: 6
Plan Prescribed Dose Per Fraction: 1.8 Gy
Plan Prescribed Dose Per Fraction: 1.8 Gy
Plan Total Fractions Prescribed: 14
Plan Total Fractions Prescribed: 28
Plan Total Prescribed Dose: 25.2 Gy
Plan Total Prescribed Dose: 50.4 Gy
Reference Point Dosage Given to Date: 10.8 Gy
Reference Point Dosage Given to Date: 5.4 Gy
Reference Point Session Dosage Given: 1.8 Gy
Reference Point Session Dosage Given: 1.8 Gy
Session Number: 6

## 2023-10-05 ENCOUNTER — Ambulatory Visit
Admission: RE | Admit: 2023-10-05 | Discharge: 2023-10-05 | Disposition: A | Discharge status: Home / Self Care | Payer: Medicare HMO | Source: Ambulatory Visit | Attending: Radiation Oncology

## 2023-10-05 ENCOUNTER — Other Ambulatory Visit: Payer: Self-pay

## 2023-10-05 DIAGNOSIS — C50412 Malignant neoplasm of upper-outer quadrant of left female breast: Secondary | ICD-10-CM | POA: Diagnosis not present

## 2023-10-05 DIAGNOSIS — Z510 Encounter for antineoplastic radiation therapy: Secondary | ICD-10-CM | POA: Diagnosis not present

## 2023-10-05 DIAGNOSIS — Z170 Estrogen receptor positive status [ER+]: Secondary | ICD-10-CM | POA: Diagnosis not present

## 2023-10-05 LAB — RAD ONC ARIA SESSION SUMMARY
Course Elapsed Days: 8
Plan Fractions Treated to Date: 4
Plan Fractions Treated to Date: 7
Plan Prescribed Dose Per Fraction: 1.8 Gy
Plan Prescribed Dose Per Fraction: 1.8 Gy
Plan Total Fractions Prescribed: 14
Plan Total Fractions Prescribed: 28
Plan Total Prescribed Dose: 25.2 Gy
Plan Total Prescribed Dose: 50.4 Gy
Reference Point Dosage Given to Date: 12.6 Gy
Reference Point Dosage Given to Date: 7.2 Gy
Reference Point Session Dosage Given: 1.8 Gy
Reference Point Session Dosage Given: 1.8 Gy
Session Number: 7

## 2023-10-06 ENCOUNTER — Other Ambulatory Visit: Payer: Self-pay

## 2023-10-06 ENCOUNTER — Ambulatory Visit
Admission: RE | Admit: 2023-10-06 | Discharge: 2023-10-06 | Disposition: A | Discharge status: Home / Self Care | Payer: Medicare HMO | Source: Ambulatory Visit | Attending: Radiation Oncology | Admitting: Radiation Oncology

## 2023-10-06 DIAGNOSIS — C50412 Malignant neoplasm of upper-outer quadrant of left female breast: Secondary | ICD-10-CM | POA: Diagnosis not present

## 2023-10-06 DIAGNOSIS — Z170 Estrogen receptor positive status [ER+]: Secondary | ICD-10-CM | POA: Diagnosis not present

## 2023-10-06 DIAGNOSIS — Z510 Encounter for antineoplastic radiation therapy: Secondary | ICD-10-CM | POA: Diagnosis not present

## 2023-10-06 LAB — RAD ONC ARIA SESSION SUMMARY
Course Elapsed Days: 9
Plan Fractions Treated to Date: 4
Plan Fractions Treated to Date: 8
Plan Prescribed Dose Per Fraction: 1.8 Gy
Plan Prescribed Dose Per Fraction: 1.8 Gy
Plan Total Fractions Prescribed: 14
Plan Total Fractions Prescribed: 28
Plan Total Prescribed Dose: 25.2 Gy
Plan Total Prescribed Dose: 50.4 Gy
Reference Point Dosage Given to Date: 14.4 Gy
Reference Point Dosage Given to Date: 7.2 Gy
Reference Point Session Dosage Given: 1.8 Gy
Reference Point Session Dosage Given: 1.8 Gy
Session Number: 8

## 2023-10-07 ENCOUNTER — Ambulatory Visit
Admission: RE | Admit: 2023-10-07 | Discharge: 2023-10-07 | Disposition: A | Discharge status: Home / Self Care | Source: Ambulatory Visit | Attending: Radiation Oncology | Admitting: Radiation Oncology

## 2023-10-07 ENCOUNTER — Other Ambulatory Visit: Payer: Self-pay

## 2023-10-07 ENCOUNTER — Ambulatory Visit
Admission: RE | Admit: 2023-10-07 | Discharge: 2023-10-07 | Disposition: A | Discharge status: Home / Self Care | Payer: Medicare HMO | Source: Ambulatory Visit | Attending: Radiation Oncology | Admitting: Radiation Oncology

## 2023-10-07 DIAGNOSIS — Z170 Estrogen receptor positive status [ER+]: Secondary | ICD-10-CM | POA: Diagnosis not present

## 2023-10-07 DIAGNOSIS — Z510 Encounter for antineoplastic radiation therapy: Secondary | ICD-10-CM | POA: Diagnosis not present

## 2023-10-07 DIAGNOSIS — C50412 Malignant neoplasm of upper-outer quadrant of left female breast: Secondary | ICD-10-CM | POA: Diagnosis not present

## 2023-10-07 LAB — RAD ONC ARIA SESSION SUMMARY
Course Elapsed Days: 10
Plan Fractions Treated to Date: 5
Plan Fractions Treated to Date: 9
Plan Prescribed Dose Per Fraction: 1.8 Gy
Plan Prescribed Dose Per Fraction: 1.8 Gy
Plan Total Fractions Prescribed: 14
Plan Total Fractions Prescribed: 28
Plan Total Prescribed Dose: 25.2 Gy
Plan Total Prescribed Dose: 50.4 Gy
Reference Point Dosage Given to Date: 16.2 Gy
Reference Point Dosage Given to Date: 9 Gy
Reference Point Session Dosage Given: 1.8 Gy
Reference Point Session Dosage Given: 1.8 Gy
Session Number: 9

## 2023-10-10 ENCOUNTER — Other Ambulatory Visit: Payer: Self-pay

## 2023-10-10 ENCOUNTER — Ambulatory Visit
Admission: RE | Admit: 2023-10-10 | Discharge: 2023-10-10 | Disposition: A | Discharge status: Home / Self Care | Payer: Medicare HMO | Source: Ambulatory Visit | Attending: Radiation Oncology

## 2023-10-10 DIAGNOSIS — Z170 Estrogen receptor positive status [ER+]: Secondary | ICD-10-CM | POA: Diagnosis not present

## 2023-10-10 DIAGNOSIS — Z510 Encounter for antineoplastic radiation therapy: Secondary | ICD-10-CM | POA: Diagnosis not present

## 2023-10-10 DIAGNOSIS — C50412 Malignant neoplasm of upper-outer quadrant of left female breast: Secondary | ICD-10-CM | POA: Diagnosis not present

## 2023-10-10 LAB — RAD ONC ARIA SESSION SUMMARY
Course Elapsed Days: 13
Plan Fractions Treated to Date: 10
Plan Fractions Treated to Date: 5
Plan Prescribed Dose Per Fraction: 1.8 Gy
Plan Prescribed Dose Per Fraction: 1.8 Gy
Plan Total Fractions Prescribed: 14
Plan Total Fractions Prescribed: 28
Plan Total Prescribed Dose: 25.2 Gy
Plan Total Prescribed Dose: 50.4 Gy
Reference Point Dosage Given to Date: 18 Gy
Reference Point Dosage Given to Date: 9 Gy
Reference Point Session Dosage Given: 1.8 Gy
Reference Point Session Dosage Given: 1.8 Gy
Session Number: 10

## 2023-10-11 ENCOUNTER — Other Ambulatory Visit: Payer: Self-pay

## 2023-10-11 ENCOUNTER — Ambulatory Visit
Admission: RE | Admit: 2023-10-11 | Discharge: 2023-10-11 | Disposition: A | Discharge status: Home / Self Care | Payer: Medicare HMO | Source: Ambulatory Visit | Attending: Radiation Oncology | Admitting: Radiation Oncology

## 2023-10-11 DIAGNOSIS — Z170 Estrogen receptor positive status [ER+]: Secondary | ICD-10-CM | POA: Diagnosis not present

## 2023-10-11 DIAGNOSIS — C50412 Malignant neoplasm of upper-outer quadrant of left female breast: Secondary | ICD-10-CM | POA: Diagnosis not present

## 2023-10-11 DIAGNOSIS — Z510 Encounter for antineoplastic radiation therapy: Secondary | ICD-10-CM | POA: Diagnosis not present

## 2023-10-11 LAB — RAD ONC ARIA SESSION SUMMARY
Course Elapsed Days: 14
Plan Fractions Treated to Date: 11
Plan Fractions Treated to Date: 6
Plan Prescribed Dose Per Fraction: 1.8 Gy
Plan Prescribed Dose Per Fraction: 1.8 Gy
Plan Total Fractions Prescribed: 14
Plan Total Fractions Prescribed: 28
Plan Total Prescribed Dose: 25.2 Gy
Plan Total Prescribed Dose: 50.4 Gy
Reference Point Dosage Given to Date: 10.8 Gy
Reference Point Dosage Given to Date: 19.8 Gy
Reference Point Session Dosage Given: 1.8 Gy
Reference Point Session Dosage Given: 1.8 Gy
Session Number: 11

## 2023-10-12 ENCOUNTER — Other Ambulatory Visit: Payer: Self-pay

## 2023-10-12 ENCOUNTER — Ambulatory Visit
Admission: RE | Admit: 2023-10-12 | Discharge: 2023-10-12 | Disposition: A | Discharge status: Home / Self Care | Payer: Medicare HMO | Source: Ambulatory Visit | Attending: Radiation Oncology | Admitting: Radiation Oncology

## 2023-10-12 DIAGNOSIS — Z510 Encounter for antineoplastic radiation therapy: Secondary | ICD-10-CM | POA: Diagnosis not present

## 2023-10-12 DIAGNOSIS — Z170 Estrogen receptor positive status [ER+]: Secondary | ICD-10-CM | POA: Diagnosis not present

## 2023-10-12 DIAGNOSIS — C50412 Malignant neoplasm of upper-outer quadrant of left female breast: Secondary | ICD-10-CM | POA: Diagnosis not present

## 2023-10-12 LAB — RAD ONC ARIA SESSION SUMMARY
Course Elapsed Days: 15
Plan Fractions Treated to Date: 12
Plan Fractions Treated to Date: 6
Plan Prescribed Dose Per Fraction: 1.8 Gy
Plan Prescribed Dose Per Fraction: 1.8 Gy
Plan Total Fractions Prescribed: 14
Plan Total Fractions Prescribed: 28
Plan Total Prescribed Dose: 25.2 Gy
Plan Total Prescribed Dose: 50.4 Gy
Reference Point Dosage Given to Date: 10.8 Gy
Reference Point Dosage Given to Date: 21.6 Gy
Reference Point Session Dosage Given: 1.8 Gy
Reference Point Session Dosage Given: 1.8 Gy
Session Number: 12

## 2023-10-13 ENCOUNTER — Other Ambulatory Visit: Payer: Self-pay

## 2023-10-13 ENCOUNTER — Ambulatory Visit
Admission: RE | Admit: 2023-10-13 | Discharge: 2023-10-13 | Disposition: A | Discharge status: Home / Self Care | Payer: Medicare HMO | Source: Ambulatory Visit | Attending: Radiation Oncology | Admitting: Radiation Oncology

## 2023-10-13 DIAGNOSIS — Z170 Estrogen receptor positive status [ER+]: Secondary | ICD-10-CM | POA: Diagnosis not present

## 2023-10-13 DIAGNOSIS — Z510 Encounter for antineoplastic radiation therapy: Secondary | ICD-10-CM | POA: Diagnosis not present

## 2023-10-13 DIAGNOSIS — C50412 Malignant neoplasm of upper-outer quadrant of left female breast: Secondary | ICD-10-CM | POA: Diagnosis not present

## 2023-10-13 LAB — RAD ONC ARIA SESSION SUMMARY
Course Elapsed Days: 16
Plan Fractions Treated to Date: 13
Plan Fractions Treated to Date: 7
Plan Prescribed Dose Per Fraction: 1.8 Gy
Plan Prescribed Dose Per Fraction: 1.8 Gy
Plan Total Fractions Prescribed: 14
Plan Total Fractions Prescribed: 28
Plan Total Prescribed Dose: 25.2 Gy
Plan Total Prescribed Dose: 50.4 Gy
Reference Point Dosage Given to Date: 12.6 Gy
Reference Point Dosage Given to Date: 23.4 Gy
Reference Point Session Dosage Given: 1.8 Gy
Reference Point Session Dosage Given: 1.8 Gy
Session Number: 13

## 2023-10-14 ENCOUNTER — Ambulatory Visit
Admission: RE | Admit: 2023-10-14 | Discharge: 2023-10-14 | Disposition: A | Discharge status: Home / Self Care | Source: Ambulatory Visit | Attending: Radiation Oncology | Admitting: Radiation Oncology

## 2023-10-14 ENCOUNTER — Other Ambulatory Visit: Payer: Self-pay

## 2023-10-14 ENCOUNTER — Ambulatory Visit
Admission: RE | Admit: 2023-10-14 | Discharge: 2023-10-14 | Disposition: A | Discharge status: Home / Self Care | Payer: Medicare HMO | Source: Ambulatory Visit | Attending: Radiation Oncology | Admitting: Radiation Oncology

## 2023-10-14 DIAGNOSIS — C50412 Malignant neoplasm of upper-outer quadrant of left female breast: Secondary | ICD-10-CM | POA: Diagnosis not present

## 2023-10-14 DIAGNOSIS — Z170 Estrogen receptor positive status [ER+]: Secondary | ICD-10-CM | POA: Diagnosis not present

## 2023-10-14 LAB — RAD ONC ARIA SESSION SUMMARY
Course Elapsed Days: 17
Plan Fractions Treated to Date: 14
Plan Fractions Treated to Date: 7
Plan Prescribed Dose Per Fraction: 1.8 Gy
Plan Prescribed Dose Per Fraction: 1.8 Gy
Plan Total Fractions Prescribed: 14
Plan Total Fractions Prescribed: 28
Plan Total Prescribed Dose: 25.2 Gy
Plan Total Prescribed Dose: 50.4 Gy
Reference Point Dosage Given to Date: 12.6 Gy
Reference Point Dosage Given to Date: 25.2 Gy
Reference Point Session Dosage Given: 1.8 Gy
Reference Point Session Dosage Given: 1.8 Gy
Session Number: 14

## 2023-10-17 ENCOUNTER — Ambulatory Visit
Admission: RE | Admit: 2023-10-17 | Discharge: 2023-10-17 | Disposition: A | Discharge status: Home / Self Care | Payer: Medicare HMO | Source: Ambulatory Visit | Attending: Radiation Oncology

## 2023-10-17 ENCOUNTER — Other Ambulatory Visit: Payer: Self-pay

## 2023-10-17 DIAGNOSIS — C50412 Malignant neoplasm of upper-outer quadrant of left female breast: Secondary | ICD-10-CM | POA: Diagnosis not present

## 2023-10-17 DIAGNOSIS — Z170 Estrogen receptor positive status [ER+]: Secondary | ICD-10-CM | POA: Diagnosis not present

## 2023-10-17 DIAGNOSIS — Z510 Encounter for antineoplastic radiation therapy: Secondary | ICD-10-CM | POA: Diagnosis not present

## 2023-10-17 LAB — RAD ONC ARIA SESSION SUMMARY
Course Elapsed Days: 20
Plan Fractions Treated to Date: 15
Plan Fractions Treated to Date: 8
Plan Prescribed Dose Per Fraction: 1.8 Gy
Plan Prescribed Dose Per Fraction: 1.8 Gy
Plan Total Fractions Prescribed: 14
Plan Total Fractions Prescribed: 28
Plan Total Prescribed Dose: 25.2 Gy
Plan Total Prescribed Dose: 50.4 Gy
Reference Point Dosage Given to Date: 14.4 Gy
Reference Point Dosage Given to Date: 27 Gy
Reference Point Session Dosage Given: 1.8 Gy
Reference Point Session Dosage Given: 1.8 Gy
Session Number: 15

## 2023-10-18 ENCOUNTER — Other Ambulatory Visit: Payer: Self-pay

## 2023-10-18 ENCOUNTER — Ambulatory Visit
Admission: RE | Admit: 2023-10-18 | Discharge: 2023-10-18 | Disposition: A | Discharge status: Home / Self Care | Payer: Medicare HMO | Source: Ambulatory Visit | Attending: Radiation Oncology

## 2023-10-18 DIAGNOSIS — Z510 Encounter for antineoplastic radiation therapy: Secondary | ICD-10-CM | POA: Diagnosis not present

## 2023-10-18 DIAGNOSIS — Z170 Estrogen receptor positive status [ER+]: Secondary | ICD-10-CM | POA: Diagnosis not present

## 2023-10-18 DIAGNOSIS — C50412 Malignant neoplasm of upper-outer quadrant of left female breast: Secondary | ICD-10-CM | POA: Diagnosis not present

## 2023-10-18 LAB — RAD ONC ARIA SESSION SUMMARY
Course Elapsed Days: 21
Plan Fractions Treated to Date: 16
Plan Fractions Treated to Date: 8
Plan Prescribed Dose Per Fraction: 1.8 Gy
Plan Prescribed Dose Per Fraction: 1.8 Gy
Plan Total Fractions Prescribed: 14
Plan Total Fractions Prescribed: 28
Plan Total Prescribed Dose: 25.2 Gy
Plan Total Prescribed Dose: 50.4 Gy
Reference Point Dosage Given to Date: 14.4 Gy
Reference Point Dosage Given to Date: 28.8 Gy
Reference Point Session Dosage Given: 1.8 Gy
Reference Point Session Dosage Given: 1.8 Gy
Session Number: 16

## 2023-10-19 ENCOUNTER — Other Ambulatory Visit: Payer: Self-pay

## 2023-10-19 ENCOUNTER — Ambulatory Visit
Admission: RE | Admit: 2023-10-19 | Discharge: 2023-10-19 | Disposition: A | Discharge status: Home / Self Care | Payer: Medicare HMO | Source: Ambulatory Visit | Attending: Radiation Oncology | Admitting: Radiation Oncology

## 2023-10-19 DIAGNOSIS — C50412 Malignant neoplasm of upper-outer quadrant of left female breast: Secondary | ICD-10-CM | POA: Diagnosis not present

## 2023-10-19 DIAGNOSIS — Z510 Encounter for antineoplastic radiation therapy: Secondary | ICD-10-CM | POA: Diagnosis not present

## 2023-10-19 DIAGNOSIS — Z170 Estrogen receptor positive status [ER+]: Secondary | ICD-10-CM | POA: Diagnosis not present

## 2023-10-19 LAB — RAD ONC ARIA SESSION SUMMARY
Course Elapsed Days: 22
Plan Fractions Treated to Date: 17
Plan Fractions Treated to Date: 9
Plan Prescribed Dose Per Fraction: 1.8 Gy
Plan Prescribed Dose Per Fraction: 1.8 Gy
Plan Total Fractions Prescribed: 14
Plan Total Fractions Prescribed: 28
Plan Total Prescribed Dose: 25.2 Gy
Plan Total Prescribed Dose: 50.4 Gy
Reference Point Dosage Given to Date: 16.2 Gy
Reference Point Dosage Given to Date: 30.6 Gy
Reference Point Session Dosage Given: 1.8 Gy
Reference Point Session Dosage Given: 1.8 Gy
Session Number: 17

## 2023-10-20 ENCOUNTER — Other Ambulatory Visit: Payer: Self-pay

## 2023-10-20 ENCOUNTER — Ambulatory Visit
Admission: RE | Admit: 2023-10-20 | Discharge: 2023-10-20 | Disposition: A | Discharge status: Home / Self Care | Payer: Medicare HMO | Source: Ambulatory Visit | Attending: Radiation Oncology | Admitting: Radiation Oncology

## 2023-10-20 DIAGNOSIS — Z510 Encounter for antineoplastic radiation therapy: Secondary | ICD-10-CM | POA: Diagnosis not present

## 2023-10-20 DIAGNOSIS — Z170 Estrogen receptor positive status [ER+]: Secondary | ICD-10-CM | POA: Diagnosis not present

## 2023-10-20 DIAGNOSIS — C50412 Malignant neoplasm of upper-outer quadrant of left female breast: Secondary | ICD-10-CM | POA: Diagnosis not present

## 2023-10-20 LAB — RAD ONC ARIA SESSION SUMMARY
Course Elapsed Days: 23
Plan Fractions Treated to Date: 18
Plan Fractions Treated to Date: 9
Plan Prescribed Dose Per Fraction: 1.8 Gy
Plan Prescribed Dose Per Fraction: 1.8 Gy
Plan Total Fractions Prescribed: 14
Plan Total Fractions Prescribed: 28
Plan Total Prescribed Dose: 25.2 Gy
Plan Total Prescribed Dose: 50.4 Gy
Reference Point Dosage Given to Date: 16.2 Gy
Reference Point Dosage Given to Date: 32.4 Gy
Reference Point Session Dosage Given: 1.8 Gy
Reference Point Session Dosage Given: 1.8 Gy
Session Number: 18

## 2023-10-21 ENCOUNTER — Ambulatory Visit
Admission: RE | Admit: 2023-10-21 | Discharge: 2023-10-21 | Disposition: A | Discharge status: Home / Self Care | Source: Ambulatory Visit | Attending: Radiation Oncology | Admitting: Radiation Oncology

## 2023-10-21 ENCOUNTER — Other Ambulatory Visit: Payer: Self-pay

## 2023-10-21 ENCOUNTER — Ambulatory Visit
Admission: RE | Admit: 2023-10-21 | Discharge: 2023-10-21 | Disposition: A | Discharge status: Home / Self Care | Payer: Medicare HMO | Source: Ambulatory Visit | Attending: Radiation Oncology | Admitting: Radiation Oncology

## 2023-10-21 DIAGNOSIS — C50412 Malignant neoplasm of upper-outer quadrant of left female breast: Secondary | ICD-10-CM | POA: Diagnosis not present

## 2023-10-21 DIAGNOSIS — Z170 Estrogen receptor positive status [ER+]: Secondary | ICD-10-CM | POA: Diagnosis not present

## 2023-10-21 DIAGNOSIS — Z510 Encounter for antineoplastic radiation therapy: Secondary | ICD-10-CM | POA: Diagnosis not present

## 2023-10-21 LAB — RAD ONC ARIA SESSION SUMMARY
Course Elapsed Days: 24
Plan Fractions Treated to Date: 10
Plan Fractions Treated to Date: 19
Plan Prescribed Dose Per Fraction: 1.8 Gy
Plan Prescribed Dose Per Fraction: 1.8 Gy
Plan Total Fractions Prescribed: 14
Plan Total Fractions Prescribed: 28
Plan Total Prescribed Dose: 25.2 Gy
Plan Total Prescribed Dose: 50.4 Gy
Reference Point Dosage Given to Date: 18 Gy
Reference Point Dosage Given to Date: 34.2 Gy
Reference Point Session Dosage Given: 1.8 Gy
Reference Point Session Dosage Given: 1.8 Gy
Session Number: 19

## 2023-10-21 MED ORDER — RADIAPLEXRX EX GEL: Freq: Once | CUTANEOUS | Status: AC

## 2023-10-24 ENCOUNTER — Other Ambulatory Visit: Payer: Self-pay

## 2023-10-24 ENCOUNTER — Ambulatory Visit
Admission: RE | Admit: 2023-10-24 | Discharge: 2023-10-24 | Disposition: A | Discharge status: Home / Self Care | Payer: Medicare HMO | Source: Ambulatory Visit | Attending: Radiation Oncology

## 2023-10-24 DIAGNOSIS — Z170 Estrogen receptor positive status [ER+]: Secondary | ICD-10-CM | POA: Diagnosis not present

## 2023-10-24 DIAGNOSIS — Z510 Encounter for antineoplastic radiation therapy: Secondary | ICD-10-CM | POA: Diagnosis not present

## 2023-10-24 DIAGNOSIS — C50412 Malignant neoplasm of upper-outer quadrant of left female breast: Secondary | ICD-10-CM | POA: Diagnosis not present

## 2023-10-24 LAB — RAD ONC ARIA SESSION SUMMARY
Course Elapsed Days: 27
Plan Fractions Treated to Date: 10
Plan Fractions Treated to Date: 20
Plan Prescribed Dose Per Fraction: 1.8 Gy
Plan Prescribed Dose Per Fraction: 1.8 Gy
Plan Total Fractions Prescribed: 14
Plan Total Fractions Prescribed: 28
Plan Total Prescribed Dose: 25.2 Gy
Plan Total Prescribed Dose: 50.4 Gy
Reference Point Dosage Given to Date: 18 Gy
Reference Point Dosage Given to Date: 36 Gy
Reference Point Session Dosage Given: 1.8 Gy
Reference Point Session Dosage Given: 1.8 Gy
Session Number: 20

## 2023-10-25 ENCOUNTER — Other Ambulatory Visit: Payer: Self-pay

## 2023-10-25 ENCOUNTER — Ambulatory Visit
Admission: RE | Admit: 2023-10-25 | Discharge: 2023-10-25 | Disposition: A | Discharge status: Home / Self Care | Payer: Medicare HMO | Source: Ambulatory Visit | Attending: Radiation Oncology | Admitting: Radiation Oncology

## 2023-10-25 DIAGNOSIS — C50412 Malignant neoplasm of upper-outer quadrant of left female breast: Secondary | ICD-10-CM | POA: Diagnosis not present

## 2023-10-25 DIAGNOSIS — Z170 Estrogen receptor positive status [ER+]: Secondary | ICD-10-CM | POA: Diagnosis not present

## 2023-10-25 DIAGNOSIS — Z510 Encounter for antineoplastic radiation therapy: Secondary | ICD-10-CM | POA: Diagnosis not present

## 2023-10-25 LAB — RAD ONC ARIA SESSION SUMMARY
Course Elapsed Days: 28
Plan Fractions Treated to Date: 11
Plan Fractions Treated to Date: 21
Plan Prescribed Dose Per Fraction: 1.8 Gy
Plan Prescribed Dose Per Fraction: 1.8 Gy
Plan Total Fractions Prescribed: 14
Plan Total Fractions Prescribed: 28
Plan Total Prescribed Dose: 25.2 Gy
Plan Total Prescribed Dose: 50.4 Gy
Reference Point Dosage Given to Date: 19.8 Gy
Reference Point Dosage Given to Date: 37.8 Gy
Reference Point Session Dosage Given: 1.8 Gy
Reference Point Session Dosage Given: 1.8 Gy
Session Number: 21

## 2023-10-26 ENCOUNTER — Ambulatory Visit
Admission: RE | Admit: 2023-10-26 | Discharge: 2023-10-26 | Disposition: A | Discharge status: Home / Self Care | Payer: Medicare HMO | Source: Ambulatory Visit | Attending: Radiation Oncology | Admitting: Radiation Oncology

## 2023-10-26 ENCOUNTER — Other Ambulatory Visit: Payer: Self-pay

## 2023-10-26 DIAGNOSIS — Z510 Encounter for antineoplastic radiation therapy: Secondary | ICD-10-CM | POA: Diagnosis not present

## 2023-10-26 DIAGNOSIS — C50412 Malignant neoplasm of upper-outer quadrant of left female breast: Secondary | ICD-10-CM | POA: Diagnosis not present

## 2023-10-26 DIAGNOSIS — Z170 Estrogen receptor positive status [ER+]: Secondary | ICD-10-CM | POA: Diagnosis not present

## 2023-10-26 LAB — RAD ONC ARIA SESSION SUMMARY
Course Elapsed Days: 29
Plan Fractions Treated to Date: 11
Plan Fractions Treated to Date: 22
Plan Prescribed Dose Per Fraction: 1.8 Gy
Plan Prescribed Dose Per Fraction: 1.8 Gy
Plan Total Fractions Prescribed: 14
Plan Total Fractions Prescribed: 28
Plan Total Prescribed Dose: 25.2 Gy
Plan Total Prescribed Dose: 50.4 Gy
Reference Point Dosage Given to Date: 19.8 Gy
Reference Point Dosage Given to Date: 39.6 Gy
Reference Point Session Dosage Given: 1.8 Gy
Reference Point Session Dosage Given: 1.8 Gy
Session Number: 22

## 2023-10-27 ENCOUNTER — Other Ambulatory Visit: Payer: Self-pay

## 2023-10-27 ENCOUNTER — Ambulatory Visit
Admission: RE | Admit: 2023-10-27 | Discharge: 2023-10-27 | Disposition: A | Discharge status: Home / Self Care | Payer: Medicare HMO | Source: Ambulatory Visit | Attending: Radiation Oncology

## 2023-10-27 DIAGNOSIS — Z170 Estrogen receptor positive status [ER+]: Secondary | ICD-10-CM | POA: Diagnosis not present

## 2023-10-27 DIAGNOSIS — C50412 Malignant neoplasm of upper-outer quadrant of left female breast: Secondary | ICD-10-CM | POA: Diagnosis not present

## 2023-10-27 DIAGNOSIS — Z510 Encounter for antineoplastic radiation therapy: Secondary | ICD-10-CM | POA: Diagnosis not present

## 2023-10-27 LAB — RAD ONC ARIA SESSION SUMMARY
Course Elapsed Days: 30
Plan Fractions Treated to Date: 12
Plan Fractions Treated to Date: 23
Plan Prescribed Dose Per Fraction: 1.8 Gy
Plan Prescribed Dose Per Fraction: 1.8 Gy
Plan Total Fractions Prescribed: 14
Plan Total Fractions Prescribed: 28
Plan Total Prescribed Dose: 25.2 Gy
Plan Total Prescribed Dose: 50.4 Gy
Reference Point Dosage Given to Date: 21.6 Gy
Reference Point Dosage Given to Date: 41.4 Gy
Reference Point Session Dosage Given: 1.8 Gy
Reference Point Session Dosage Given: 1.8 Gy
Session Number: 23

## 2023-10-28 ENCOUNTER — Ambulatory Visit
Admission: RE | Admit: 2023-10-28 | Discharge: 2023-10-28 | Disposition: A | Discharge status: Home / Self Care | Payer: Medicare HMO | Source: Ambulatory Visit | Attending: Radiation Oncology | Admitting: Radiation Oncology

## 2023-10-28 ENCOUNTER — Other Ambulatory Visit: Payer: Self-pay

## 2023-10-28 ENCOUNTER — Ambulatory Visit: Payer: Medicare HMO | Admitting: Radiation Oncology

## 2023-10-28 ENCOUNTER — Ambulatory Visit
Admission: RE | Admit: 2023-10-28 | Discharge: 2023-10-28 | Disposition: A | Discharge status: Home / Self Care | Source: Ambulatory Visit | Attending: Radiation Oncology | Admitting: Radiation Oncology

## 2023-10-28 DIAGNOSIS — C50412 Malignant neoplasm of upper-outer quadrant of left female breast: Secondary | ICD-10-CM | POA: Diagnosis not present

## 2023-10-28 DIAGNOSIS — Z170 Estrogen receptor positive status [ER+]: Secondary | ICD-10-CM | POA: Diagnosis not present

## 2023-10-28 DIAGNOSIS — Z510 Encounter for antineoplastic radiation therapy: Secondary | ICD-10-CM | POA: Diagnosis not present

## 2023-10-28 LAB — RAD ONC ARIA SESSION SUMMARY
Course Elapsed Days: 31
Plan Fractions Treated to Date: 12
Plan Fractions Treated to Date: 24
Plan Prescribed Dose Per Fraction: 1.8 Gy
Plan Prescribed Dose Per Fraction: 1.8 Gy
Plan Total Fractions Prescribed: 14
Plan Total Fractions Prescribed: 28
Plan Total Prescribed Dose: 25.2 Gy
Plan Total Prescribed Dose: 50.4 Gy
Reference Point Dosage Given to Date: 21.6 Gy
Reference Point Dosage Given to Date: 43.2 Gy
Reference Point Session Dosage Given: 1.8 Gy
Reference Point Session Dosage Given: 1.8 Gy
Session Number: 24

## 2023-10-31 ENCOUNTER — Ambulatory Visit
Admission: RE | Admit: 2023-10-31 | Discharge: 2023-10-31 | Disposition: A | Discharge status: Home / Self Care | Payer: Medicare HMO | Source: Ambulatory Visit | Attending: Radiation Oncology

## 2023-10-31 ENCOUNTER — Other Ambulatory Visit: Payer: Self-pay

## 2023-10-31 DIAGNOSIS — Z510 Encounter for antineoplastic radiation therapy: Secondary | ICD-10-CM | POA: Diagnosis not present

## 2023-10-31 DIAGNOSIS — Z170 Estrogen receptor positive status [ER+]: Secondary | ICD-10-CM | POA: Diagnosis not present

## 2023-10-31 DIAGNOSIS — C50412 Malignant neoplasm of upper-outer quadrant of left female breast: Secondary | ICD-10-CM | POA: Diagnosis not present

## 2023-10-31 LAB — RAD ONC ARIA SESSION SUMMARY
Course Elapsed Days: 34
Plan Fractions Treated to Date: 13
Plan Fractions Treated to Date: 25
Plan Prescribed Dose Per Fraction: 1.8 Gy
Plan Prescribed Dose Per Fraction: 1.8 Gy
Plan Total Fractions Prescribed: 14
Plan Total Fractions Prescribed: 28
Plan Total Prescribed Dose: 25.2 Gy
Plan Total Prescribed Dose: 50.4 Gy
Reference Point Dosage Given to Date: 23.4 Gy
Reference Point Dosage Given to Date: 45 Gy
Reference Point Session Dosage Given: 1.8 Gy
Reference Point Session Dosage Given: 1.8 Gy
Session Number: 25

## 2023-11-01 ENCOUNTER — Other Ambulatory Visit: Payer: Self-pay

## 2023-11-01 ENCOUNTER — Ambulatory Visit
Admission: RE | Admit: 2023-11-01 | Discharge: 2023-11-01 | Disposition: A | Discharge status: Home / Self Care | Payer: Medicare HMO | Source: Ambulatory Visit | Attending: Radiation Oncology | Admitting: Radiation Oncology

## 2023-11-01 DIAGNOSIS — C50412 Malignant neoplasm of upper-outer quadrant of left female breast: Secondary | ICD-10-CM | POA: Diagnosis not present

## 2023-11-01 DIAGNOSIS — Z510 Encounter for antineoplastic radiation therapy: Secondary | ICD-10-CM | POA: Diagnosis not present

## 2023-11-01 DIAGNOSIS — Z170 Estrogen receptor positive status [ER+]: Secondary | ICD-10-CM | POA: Insufficient documentation

## 2023-11-01 LAB — RAD ONC ARIA SESSION SUMMARY
Course Elapsed Days: 35
Plan Fractions Treated to Date: 13
Plan Fractions Treated to Date: 26
Plan Prescribed Dose Per Fraction: 1.8 Gy
Plan Prescribed Dose Per Fraction: 1.8 Gy
Plan Total Fractions Prescribed: 14
Plan Total Fractions Prescribed: 28
Plan Total Prescribed Dose: 25.2 Gy
Plan Total Prescribed Dose: 50.4 Gy
Reference Point Dosage Given to Date: 23.4 Gy
Reference Point Dosage Given to Date: 46.8 Gy
Reference Point Session Dosage Given: 1.8 Gy
Reference Point Session Dosage Given: 1.8 Gy
Session Number: 26

## 2023-11-02 ENCOUNTER — Ambulatory Visit
Admission: RE | Admit: 2023-11-02 | Discharge: 2023-11-02 | Disposition: A | Discharge status: Home / Self Care | Payer: Medicare HMO | Source: Ambulatory Visit | Attending: Radiation Oncology | Admitting: Radiation Oncology

## 2023-11-02 ENCOUNTER — Other Ambulatory Visit: Payer: Self-pay

## 2023-11-02 DIAGNOSIS — C50412 Malignant neoplasm of upper-outer quadrant of left female breast: Secondary | ICD-10-CM | POA: Diagnosis not present

## 2023-11-02 DIAGNOSIS — Z170 Estrogen receptor positive status [ER+]: Secondary | ICD-10-CM | POA: Diagnosis not present

## 2023-11-02 DIAGNOSIS — Z510 Encounter for antineoplastic radiation therapy: Secondary | ICD-10-CM | POA: Diagnosis not present

## 2023-11-02 LAB — RAD ONC ARIA SESSION SUMMARY
Course Elapsed Days: 36
Plan Fractions Treated to Date: 14
Plan Fractions Treated to Date: 27
Plan Prescribed Dose Per Fraction: 1.8 Gy
Plan Prescribed Dose Per Fraction: 1.8 Gy
Plan Total Fractions Prescribed: 14
Plan Total Fractions Prescribed: 28
Plan Total Prescribed Dose: 25.2 Gy
Plan Total Prescribed Dose: 50.4 Gy
Reference Point Dosage Given to Date: 25.2 Gy
Reference Point Dosage Given to Date: 48.6 Gy
Reference Point Session Dosage Given: 1.8 Gy
Reference Point Session Dosage Given: 1.8 Gy
Session Number: 27

## 2023-11-03 ENCOUNTER — Other Ambulatory Visit: Payer: Self-pay

## 2023-11-03 ENCOUNTER — Ambulatory Visit
Admission: RE | Admit: 2023-11-03 | Discharge: 2023-11-03 | Disposition: A | Discharge status: Home / Self Care | Payer: Medicare HMO | Source: Ambulatory Visit | Attending: Radiation Oncology

## 2023-11-03 DIAGNOSIS — Z170 Estrogen receptor positive status [ER+]: Secondary | ICD-10-CM | POA: Diagnosis not present

## 2023-11-03 DIAGNOSIS — Z510 Encounter for antineoplastic radiation therapy: Secondary | ICD-10-CM | POA: Diagnosis not present

## 2023-11-03 DIAGNOSIS — C50412 Malignant neoplasm of upper-outer quadrant of left female breast: Secondary | ICD-10-CM | POA: Diagnosis not present

## 2023-11-03 LAB — RAD ONC ARIA SESSION SUMMARY
Course Elapsed Days: 37
Plan Fractions Treated to Date: 14
Plan Fractions Treated to Date: 28
Plan Prescribed Dose Per Fraction: 1.8 Gy
Plan Prescribed Dose Per Fraction: 1.8 Gy
Plan Total Fractions Prescribed: 14
Plan Total Fractions Prescribed: 28
Plan Total Prescribed Dose: 25.2 Gy
Plan Total Prescribed Dose: 50.4 Gy
Reference Point Dosage Given to Date: 25.2 Gy
Reference Point Dosage Given to Date: 50.4 Gy
Reference Point Session Dosage Given: 1.8 Gy
Reference Point Session Dosage Given: 1.8 Gy
Session Number: 28

## 2023-11-04 ENCOUNTER — Ambulatory Visit
Admission: RE | Admit: 2023-11-04 | Discharge: 2023-11-04 | Disposition: A | Discharge status: Home / Self Care | Source: Ambulatory Visit | Attending: Radiation Oncology | Admitting: Radiation Oncology

## 2023-11-04 ENCOUNTER — Other Ambulatory Visit: Payer: Self-pay

## 2023-11-04 ENCOUNTER — Ambulatory Visit
Admission: RE | Admit: 2023-11-04 | Discharge: 2023-11-04 | Disposition: A | Discharge status: Home / Self Care | Payer: Medicare HMO | Source: Ambulatory Visit | Attending: Radiation Oncology | Admitting: Radiation Oncology

## 2023-11-04 DIAGNOSIS — Z170 Estrogen receptor positive status [ER+]: Secondary | ICD-10-CM | POA: Diagnosis not present

## 2023-11-04 DIAGNOSIS — C50412 Malignant neoplasm of upper-outer quadrant of left female breast: Secondary | ICD-10-CM | POA: Diagnosis not present

## 2023-11-04 LAB — RAD ONC ARIA SESSION SUMMARY
Course Elapsed Days: 38
Plan Fractions Treated to Date: 1
Plan Prescribed Dose Per Fraction: 2 Gy
Plan Total Fractions Prescribed: 5
Plan Total Prescribed Dose: 10 Gy
Reference Point Dosage Given to Date: 2 Gy
Reference Point Session Dosage Given: 2 Gy
Session Number: 29

## 2023-11-07 ENCOUNTER — Ambulatory Visit
Admission: RE | Admit: 2023-11-07 | Discharge: 2023-11-07 | Disposition: A | Discharge status: Home / Self Care | Payer: Medicare HMO | Source: Ambulatory Visit | Attending: Radiation Oncology

## 2023-11-07 ENCOUNTER — Other Ambulatory Visit: Payer: Self-pay

## 2023-11-07 DIAGNOSIS — Z170 Estrogen receptor positive status [ER+]: Secondary | ICD-10-CM | POA: Diagnosis not present

## 2023-11-07 DIAGNOSIS — C50412 Malignant neoplasm of upper-outer quadrant of left female breast: Secondary | ICD-10-CM | POA: Diagnosis not present

## 2023-11-07 LAB — RAD ONC ARIA SESSION SUMMARY
Course Elapsed Days: 41
Plan Fractions Treated to Date: 2
Plan Prescribed Dose Per Fraction: 2 Gy
Plan Total Fractions Prescribed: 5
Plan Total Prescribed Dose: 10 Gy
Reference Point Dosage Given to Date: 4 Gy
Reference Point Session Dosage Given: 2 Gy
Session Number: 30

## 2023-11-08 ENCOUNTER — Other Ambulatory Visit: Payer: Self-pay

## 2023-11-08 ENCOUNTER — Ambulatory Visit
Admission: RE | Admit: 2023-11-08 | Discharge: 2023-11-08 | Disposition: A | Discharge status: Home / Self Care | Payer: Medicare HMO | Source: Ambulatory Visit | Attending: Radiation Oncology

## 2023-11-08 DIAGNOSIS — C50412 Malignant neoplasm of upper-outer quadrant of left female breast: Secondary | ICD-10-CM | POA: Diagnosis not present

## 2023-11-08 DIAGNOSIS — Z170 Estrogen receptor positive status [ER+]: Secondary | ICD-10-CM | POA: Diagnosis not present

## 2023-11-08 LAB — RAD ONC ARIA SESSION SUMMARY
Course Elapsed Days: 42
Plan Fractions Treated to Date: 3
Plan Prescribed Dose Per Fraction: 2 Gy
Plan Total Fractions Prescribed: 5
Plan Total Prescribed Dose: 10 Gy
Reference Point Dosage Given to Date: 6 Gy
Reference Point Session Dosage Given: 2 Gy
Session Number: 31

## 2023-11-09 ENCOUNTER — Other Ambulatory Visit: Payer: Self-pay

## 2023-11-09 ENCOUNTER — Ambulatory Visit
Admission: RE | Admit: 2023-11-09 | Discharge: 2023-11-09 | Disposition: A | Discharge status: Home / Self Care | Payer: Medicare HMO | Source: Ambulatory Visit | Attending: Radiation Oncology

## 2023-11-09 DIAGNOSIS — C50412 Malignant neoplasm of upper-outer quadrant of left female breast: Secondary | ICD-10-CM | POA: Diagnosis not present

## 2023-11-09 DIAGNOSIS — Z170 Estrogen receptor positive status [ER+]: Secondary | ICD-10-CM | POA: Diagnosis not present

## 2023-11-09 LAB — RAD ONC ARIA SESSION SUMMARY
Course Elapsed Days: 43
Plan Fractions Treated to Date: 4
Plan Prescribed Dose Per Fraction: 2 Gy
Plan Total Fractions Prescribed: 5
Plan Total Prescribed Dose: 10 Gy
Reference Point Dosage Given to Date: 8 Gy
Reference Point Session Dosage Given: 2 Gy
Session Number: 32

## 2023-11-10 ENCOUNTER — Ambulatory Visit
Admission: RE | Admit: 2023-11-10 | Discharge: 2023-11-10 | Discharge status: Home / Self Care | Payer: Medicare HMO | Source: Ambulatory Visit | Attending: Radiation Oncology

## 2023-11-10 ENCOUNTER — Other Ambulatory Visit: Payer: Self-pay

## 2023-11-10 ENCOUNTER — Ambulatory Visit
Admission: RE | Admit: 2023-11-10 | Discharge: 2023-11-10 | Disposition: A | Discharge status: Home / Self Care | Source: Ambulatory Visit | Attending: Radiation Oncology | Admitting: Radiation Oncology

## 2023-11-10 DIAGNOSIS — Z170 Estrogen receptor positive status [ER+]: Secondary | ICD-10-CM | POA: Diagnosis not present

## 2023-11-10 DIAGNOSIS — C50412 Malignant neoplasm of upper-outer quadrant of left female breast: Secondary | ICD-10-CM | POA: Diagnosis not present

## 2023-11-10 LAB — RAD ONC ARIA SESSION SUMMARY
Course Elapsed Days: 44
Plan Fractions Treated to Date: 5
Plan Prescribed Dose Per Fraction: 2 Gy
Plan Total Fractions Prescribed: 5
Plan Total Prescribed Dose: 10 Gy
Reference Point Dosage Given to Date: 10 Gy
Reference Point Session Dosage Given: 2 Gy
Session Number: 33

## 2023-11-11 DIAGNOSIS — C50412 Malignant neoplasm of upper-outer quadrant of left female breast: Secondary | ICD-10-CM | POA: Diagnosis not present

## 2023-11-11 NOTE — Radiation Completion Notes (Addendum)
  Radiation Oncology         [phone] ________________________________  Name: Melanie Welch MRN: [id number]  Date of Service: 11/10/2023  DOB: [birth year]  End of Treatment Note   Diagnosis: Stage IIB, pT3N1M0 grade 3, ER/PR positive invasive lobular carcinoma of the left breast.   Intent: Curative     ==========DELIVERED PLANS==========  First Treatment Date: 2023-09-27 Last Treatment Date: 2023-11-10   Plan Name: CW_L_BH_BO Site: Chest Wall, Left Technique: 3D Mode: Photon Dose Per Fraction: 1.8 Gy Prescribed Dose (Delivered / Prescribed): 25.2 Gy / 25.2 Gy Prescribed Fxs (Delivered / Prescribed): 14 / 14   Plan Name: CW_L_SCV_BH Site: Chest Wall, Left Technique: 3D Mode: Photon Dose Per Fraction: 1.8 Gy Prescribed Dose (Delivered / Prescribed): 50.4 Gy / 50.4 Gy Prescribed Fxs (Delivered / Prescribed): 28 / 28   Plan Name: CW_L_Bst_BO Site: Chest Wall, Left Technique: Electron Mode: Electron Dose Per Fraction: 2 Gy Prescribed Dose (Delivered / Prescribed): 10 Gy / 10 Gy Prescribed Fxs (Delivered / Prescribed): 5 / 5   Plan Name: CW_L_BH Site: Chest Wall, Left Technique: 3D Mode: Photon Dose Per Fraction: 1.8 Gy Prescribed Dose (Delivered / Prescribed): 25.2 Gy / 25.2 Gy Prescribed Fxs (Delivered / Prescribed): 14 / 14     ==========ON TREATMENT VISIT DATES========== 2023-09-30, 2023-10-07, 2023-10-14, 2023-10-21, 2023-10-28, 2023-11-04, 2023-11-10    See weekly On Treatment Notes in Epic for details in the Media tab (listed as Progress notes on the On Treatment Visit Dates listed above). The patient tolerated radiation. She developed fatigue and anticipated skin changes in the treatment field with dry desquamation at the conclusion of therapy.  The patient will receive a call in about one month from the radiation oncology department. She will continue follow up with Dr. Gudena as well.      Shelvia Dick, PAC

## 2023-11-23 DIAGNOSIS — E876 Hypokalemia: Secondary | ICD-10-CM | POA: Diagnosis not present

## 2023-11-23 DIAGNOSIS — E1165 Type 2 diabetes mellitus with hyperglycemia: Secondary | ICD-10-CM | POA: Diagnosis not present

## 2023-11-23 DIAGNOSIS — R946 Abnormal results of thyroid function studies: Secondary | ICD-10-CM | POA: Diagnosis not present

## 2023-11-23 DIAGNOSIS — C50912 Malignant neoplasm of unspecified site of left female breast: Secondary | ICD-10-CM | POA: Diagnosis not present

## 2023-11-23 DIAGNOSIS — Z1331 Encounter for screening for depression: Secondary | ICD-10-CM | POA: Diagnosis not present

## 2023-11-23 DIAGNOSIS — Z0001 Encounter for general adult medical examination with abnormal findings: Secondary | ICD-10-CM | POA: Diagnosis not present

## 2023-11-23 DIAGNOSIS — R739 Hyperglycemia, unspecified: Secondary | ICD-10-CM | POA: Diagnosis not present

## 2023-11-23 DIAGNOSIS — Z6823 Body mass index (BMI) 23.0-23.9, adult: Secondary | ICD-10-CM | POA: Diagnosis not present

## 2023-11-23 DIAGNOSIS — E782 Mixed hyperlipidemia: Secondary | ICD-10-CM | POA: Diagnosis not present

## 2023-11-23 DIAGNOSIS — E559 Vitamin D deficiency, unspecified: Secondary | ICD-10-CM | POA: Diagnosis not present

## 2023-11-23 DIAGNOSIS — E1159 Type 2 diabetes mellitus with other circulatory complications: Secondary | ICD-10-CM | POA: Diagnosis not present

## 2023-11-23 DIAGNOSIS — I10 Essential (primary) hypertension: Secondary | ICD-10-CM | POA: Diagnosis not present

## 2023-11-29 ENCOUNTER — Inpatient Hospital Stay: Payer: Medicare HMO | Admitting: Genetic Counselor

## 2023-11-29 ENCOUNTER — Inpatient Hospital Stay: Payer: Medicare HMO

## 2023-12-15 ENCOUNTER — Inpatient Hospital Stay: Attending: Hematology and Oncology | Admitting: Hematology and Oncology

## 2023-12-15 VITALS — BP 147/75 | HR 73 | Temp 98.2°F | Resp 17 | Ht 68.0 in | Wt 153.1 lb

## 2023-12-15 DIAGNOSIS — Z170 Estrogen receptor positive status [ER+]: Secondary | ICD-10-CM | POA: Diagnosis not present

## 2023-12-15 DIAGNOSIS — E119 Type 2 diabetes mellitus without complications: Secondary | ICD-10-CM | POA: Insufficient documentation

## 2023-12-15 DIAGNOSIS — C50412 Malignant neoplasm of upper-outer quadrant of left female breast: Secondary | ICD-10-CM | POA: Insufficient documentation

## 2023-12-15 MED ORDER — ANASTROZOLE 1 MG PO TABS: 1.0000 mg | ORAL_TABLET | Freq: Every day | ORAL | 3 refills | Status: AC

## 2023-12-15 NOTE — Assessment & Plan Note (Signed)
 Palpable concern left axilla, mammogram showed 0.5 cm mass left breast 1:30 position possibly an intramammary lymph node, at the site of palpable concern there was an abnormal lymph node.  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, left axillary lymph node biopsy positive, no evidence of extranodal extension, ER 100%, PR 99%, Ki-67 50%, HER2 1+ negative, stage IIa    05/06/2023: Left mastectomy: Grade 3 ILC with LCIS, 5 cm, margins negative, lymphovascular base involvement present, 3/4 lymph nodes positive, ER 100%, PR 99%, HER2 negative 1+, Ki-67 50% 06/10/2023: ALND: 0/1 lymph node   Treatment plan: Based upon tumor size greater than 5 cm and presence of 3 positive lymph nodes being grade 3 and Ki-67 of 50% I recommended systemic chemotherapy with Taxotere  and Cytoxan  every 3 weeks x 4 completed 08/22/2023 Adjuvant radiation therapy completed 11/10/2023 Adjuvant antiestrogen therapy started 12/15/2023 --------------------------------------------------------------------------------------------------------------------------- Current treatment: Antiestrogen therapy with anastrozole 1 mg daily started 12/15/2023   Anastrozole counseling: We discussed the risks and benefits of anti-estrogen therapy with aromatase inhibitors. These include but not limited to insomnia, hot flashes, mood changes, vaginal dryness, bone density loss, and weight gain. We strongly believe that the benefits far outweigh the risks. Patient understands these risks and consented to starting treatment. Planned treatment duration is 5-7 years.  Diabetes type 2: A1c was 6.4 on 05/04/2023.  Currently on metformin .  Will reduce the dosage of dexamethasone  given with chemotherapy.   Return to clinic in 3 months for survivorship care plan visit

## 2023-12-15 NOTE — Progress Notes (Signed)
 Patient Care Team: Minus Amel, MD as PCP - General (Family Medicine) Vinetta Greening, DO as Consulting Physician (Internal Medicine)  DIAGNOSIS:  Encounter Diagnosis  Name Primary?   Malignant neoplasm of upper-outer quadrant of left breast in female, estrogen receptor positive (HCC) Yes    SUMMARY OF ONCOLOGIC HISTORY: Oncology History  Malignant neoplasm of upper-outer quadrant of left breast in female, estrogen receptor positive (HCC)  03/30/2023 Initial Diagnosis   Palpable concern left axilla, mammogram showed 0.5 cm mass left breast 1:30 position possibly an intramammary lymph node, at the site of palpable concern there was an abnormal lymph node.  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 The recommendation was for a discussion about ALND.  Dr. Lee Public plans to discuss adj chemo with her also. He is seeing her 11/4.  Looks like she is scheduled for post op appt with Dr. Alethea Andes 10/24.    06/06/2023 Cancer Staging   Staging form: Breast, AJCC 8th Edition - Pathologic: Stage IIA (pT2, pN1(sn), cM0, G3, ER+, PR+, HER2-) - Signed by Cameron Cea, MD on 06/06/2023 Stage prefix: Initial diagnosis Method of lymph node assessment: Sentinel lymph node biopsy Histologic grading system: 3 grade system   06/20/2023 -  Chemotherapy   Patient is on Treatment Plan : BREAST TC q21d       CHIEF COMPLIANT: Follow-up after radiation is completed  HISTORY OF PRESENT ILLNESS:  History of Present Illness Melanie Welch is a 78 year old female with  breast cancer who presents for follow-up after completing radiation therapy.  She completed radiation therapy one month ago and has developed a tender and itchy area on her skin. The area feels tight and painful when rubbed, and peroxide application provides temporary relief. The tenderness persists, especially during washing. She has no fever.  She has undergone surgery and radiation therapy for breast cancer. She experienced no significant side effects from chemotherapy, with occasional nausea triggered by certain foods. She has adjusted her diet to include more vegetables and carbohydrates, avoiding meat and fried foods.  She has a port in place from previous treatments and is considering its removal. She prefers using the port for blood draws over repeated needle sticks.     ALLERGIES:  is allergic to lipitor [atorvastatin].  MEDICATIONS:  Current Outpatient Medications  Medication Sig Dispense Refill   amLODipine  (NORVASC ) 10 MG tablet Take 10 mg by mouth at bedtime.      gabapentin  (NEURONTIN ) 100 MG capsule Take 100 mg by mouth as needed (nerve pain). (Patient not taking: Reported on 07/11/2023)     losartan  (COZAAR ) 100 MG tablet Take 100 mg by mouth daily.     metFORMIN  (GLUCOPHAGE ) 1000 MG tablet Take 1,000 mg by mouth 2 (two) times daily with a meal.  0   methocarbamol  (ROBAXIN ) 500 MG tablet Take 1 tablet (500 mg total) by mouth every 8 (eight) hours as needed for muscle spasms. (Patient not taking: Reported on 08/01/2023) 30 tablet 3   No current facility-administered medications for this visit.    PHYSICAL  EXAMINATION: ECOG PERFORMANCE STATUS: 1 - Symptomatic but completely ambulatory  Vitals:   12/15/23 1411  BP: (!) 147/75  Pulse: 73  Resp: 17  Temp: 98.2 F (36.8 C)  SpO2: 100%   Filed Weights   12/15/23 1411  Weight: 153 lb 1.6 oz (69.4 kg)   Skin: Papular lesion left chest wall with a central ulceration.  Tender to palpation.  LABORATORY DATA:  I have  reviewed the data as listed    Latest Ref Rng & Units 08/22/2023    8:42 AM 08/01/2023    8:49 AM 07/11/2023    9:10 AM  CMP  Glucose 70 - 99 mg/dL 191  478  295   BUN 8 - 23 mg/dL 19  19  19    Creatinine 0.44 - 1.00 mg/dL 6.21  3.08  6.57   Sodium 135 - 145 mmol/L 140  139  137   Potassium 3.5 - 5.1 mmol/L 3.8  3.9  4.7   Chloride 98 - 111 mmol/L 108  105  105   CO2 22 - 32 mmol/L 22  24  26    Calcium 8.9 - 10.3 mg/dL 9.7  8.7  9.0   Total Protein 6.5 - 8.1 g/dL 7.6  7.0  7.5   Total Bilirubin 0.0 - 1.2 mg/dL 0.3  0.2  0.2   Alkaline Phos 38 - 126 U/L 48  51  57   AST 15 - 41 U/L 14  21  15    ALT 0 - 44 U/L 22  30  21      Lab Results  Component Value Date   WBC 5.4 08/22/2023   HGB 9.4 (L) 08/22/2023   HCT 29.8 (L) 08/22/2023   MCV 86.9 08/22/2023   PLT 347 08/22/2023   NEUTROABS 4.6 08/22/2023    ASSESSMENT & PLAN:  Malignant neoplasm of upper-outer quadrant of left breast in female, estrogen receptor positive (HCC) Palpable concern left axilla, mammogram showed 0.5 cm mass left breast 1:30 position possibly an intramammary lymph node, at the site of palpable concern there was an abnormal lymph node.  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, left axillary lymph node biopsy positive, no evidence of extranodal extension, ER 100%, PR 99%, Ki-67 50%, HER2 1+ negative, stage IIa    05/06/2023: Left mastectomy: Grade 3 ILC with LCIS, 5 cm, margins negative, lymphovascular base involvement present, 3/4 lymph nodes positive, ER 100%, PR 99%, HER2 negative 1+, Ki-67 50% 06/10/2023: ALND: 0/1 lymph node   Treatment plan: Based upon tumor size greater than 5 cm and presence of 3 positive lymph nodes being grade 3 and Ki-67 of 50% I recommended systemic chemotherapy with Taxotere  and Cytoxan  every 3 weeks x 4 completed 08/22/2023 Adjuvant radiation therapy completed 11/10/2023 Adjuvant antiestrogen therapy started  12/15/2023 --------------------------------------------------------------------------------------------------------------------------- Current treatment: Antiestrogen therapy with anastrozole 1 mg daily started 12/15/2023   Anastrozole counseling: We discussed the risks and benefits of anti-estrogen therapy with aromatase inhibitors. These include but not limited to insomnia, hot flashes, mood changes, vaginal dryness, bone density loss, and weight gain. We strongly believe that the benefits far outweigh the risks. Patient understands these risks and consented to starting treatment. Planned treatment duration is 5-7 years.  Diabetes type 2: A1c was 6.4 on 05/04/2023.  Currently on metformin .    Return to clinic in 3 months for survivorship care plan visit  Assessment & Plan Malignant neoplasm of upper-outer quadrant of left breast, estrogen receptor positive Completed radiation therapy. Developed post-radiation tenderness  with possible infection. Anastrozole recommended to reduce estrogen production. Discussed side effects and bone health management. - Apply Neosporin to affected area for one week, monitor for improvement. - Prescribe anastrozole 1 mg daily for five years. - Monitor bone density every two years. - Ensure adequate vitamin D supplementation. - Encourage regular physical activity to improve bone health. - Coordinate with Dr. Elmo Haff office for port removal.      No orders of the defined types were placed in this encounter.  The patient has a good understanding of the overall plan. she agrees with it. she will call with any problems that may develop before the next visit here. Total time spent: 30 mins including face to face time and time spent for planning, charting and co-ordination of care   Viinay K Amira Podolak, MD 12/15/23

## 2024-01-11 DIAGNOSIS — C50412 Malignant neoplasm of upper-outer quadrant of left female breast: Secondary | ICD-10-CM | POA: Diagnosis not present

## 2024-01-12 ENCOUNTER — Inpatient Hospital Stay: Admitting: Genetic Counselor

## 2024-01-12 ENCOUNTER — Inpatient Hospital Stay

## 2024-01-13 ENCOUNTER — Other Ambulatory Visit: Payer: Self-pay

## 2024-01-27 DIAGNOSIS — C50912 Malignant neoplasm of unspecified site of left female breast: Secondary | ICD-10-CM | POA: Diagnosis not present

## 2024-02-07 ENCOUNTER — Telehealth: Payer: Self-pay | Admitting: Adult Health

## 2024-02-07 ENCOUNTER — Ambulatory Visit: Payer: Self-pay | Admitting: General Surgery

## 2024-02-07 NOTE — Telephone Encounter (Signed)
 Called to reschedule patient appointment to due provider pal  request. I talked  to patient and they are aware of the changes that was made to the upcoming appointment

## 2024-02-14 ENCOUNTER — Encounter: Admitting: Adult Health

## 2024-02-16 ENCOUNTER — Encounter (HOSPITAL_BASED_OUTPATIENT_CLINIC_OR_DEPARTMENT_OTHER)
Admission: RE | Admit: 2024-02-16 | Discharge: 2024-02-16 | Disposition: A | Discharge status: Home / Self Care | Source: Ambulatory Visit | Attending: General Surgery | Admitting: General Surgery

## 2024-02-16 DIAGNOSIS — Z01818 Encounter for other preprocedural examination: Secondary | ICD-10-CM | POA: Insufficient documentation

## 2024-02-16 LAB — BASIC METABOLIC PANEL WITH GFR
Anion gap: 8 (ref 5–15)
BUN: 13 mg/dL (ref 8–23)
CO2: 26 mmol/L (ref 22–32)
Calcium: 9.5 mg/dL (ref 8.9–10.3)
Chloride: 104 mmol/L (ref 98–111)
Creatinine, Ser: 1.03 mg/dL — ABNORMAL HIGH (ref 0.44–1.00)
GFR, Estimated: 56 mL/min — ABNORMAL LOW (ref >60)
Glucose, Bld: 160 mg/dL — ABNORMAL HIGH (ref 70–99)
Potassium: 4 mmol/L (ref 3.5–5.1)
Sodium: 138 mmol/L (ref 135–145)

## 2024-02-16 MED ORDER — CHLORHEXIDINE GLUCONATE CLOTH 2 % EX PADS: 6.0000 | MEDICATED_PAD | Freq: Once | CUTANEOUS | Status: DC

## 2024-02-16 NOTE — Progress Notes (Signed)

## 2024-02-20 DIAGNOSIS — E782 Mixed hyperlipidemia: Secondary | ICD-10-CM | POA: Diagnosis not present

## 2024-02-20 DIAGNOSIS — I10 Essential (primary) hypertension: Secondary | ICD-10-CM | POA: Diagnosis not present

## 2024-02-20 DIAGNOSIS — Z6824 Body mass index (BMI) 24.0-24.9, adult: Secondary | ICD-10-CM | POA: Diagnosis not present

## 2024-02-20 DIAGNOSIS — R946 Abnormal results of thyroid function studies: Secondary | ICD-10-CM | POA: Diagnosis not present

## 2024-02-22 ENCOUNTER — Other Ambulatory Visit: Payer: Self-pay

## 2024-02-22 ENCOUNTER — Encounter (HOSPITAL_BASED_OUTPATIENT_CLINIC_OR_DEPARTMENT_OTHER): Payer: Self-pay | Admitting: General Surgery

## 2024-02-22 ENCOUNTER — Encounter (HOSPITAL_BASED_OUTPATIENT_CLINIC_OR_DEPARTMENT_OTHER)
Admission: RE | Disposition: A | Discharge status: Home / Self Care | Payer: Self-pay | Source: Home / Self Care | Attending: General Surgery

## 2024-02-22 ENCOUNTER — Ambulatory Visit (HOSPITAL_BASED_OUTPATIENT_CLINIC_OR_DEPARTMENT_OTHER): Admitting: Anesthesiology

## 2024-02-22 ENCOUNTER — Ambulatory Visit (HOSPITAL_BASED_OUTPATIENT_CLINIC_OR_DEPARTMENT_OTHER)
Admission: RE | Admit: 2024-02-22 | Discharge: 2024-02-22 | Disposition: A | Discharge status: Home / Self Care | Attending: General Surgery | Admitting: General Surgery

## 2024-02-22 DIAGNOSIS — Z7984 Long term (current) use of oral hypoglycemic drugs: Secondary | ICD-10-CM | POA: Diagnosis not present

## 2024-02-22 DIAGNOSIS — Z5941 Food insecurity: Secondary | ICD-10-CM | POA: Diagnosis not present

## 2024-02-22 DIAGNOSIS — Z87891 Personal history of nicotine dependence: Secondary | ICD-10-CM | POA: Diagnosis not present

## 2024-02-22 DIAGNOSIS — E782 Mixed hyperlipidemia: Secondary | ICD-10-CM | POA: Diagnosis not present

## 2024-02-22 DIAGNOSIS — I129 Hypertensive chronic kidney disease with stage 1 through stage 4 chronic kidney disease, or unspecified chronic kidney disease: Secondary | ICD-10-CM | POA: Insufficient documentation

## 2024-02-22 DIAGNOSIS — Z9012 Acquired absence of left breast and nipple: Secondary | ICD-10-CM | POA: Diagnosis not present

## 2024-02-22 DIAGNOSIS — Z853 Personal history of malignant neoplasm of breast: Secondary | ICD-10-CM | POA: Insufficient documentation

## 2024-02-22 DIAGNOSIS — Z833 Family history of diabetes mellitus: Secondary | ICD-10-CM | POA: Diagnosis not present

## 2024-02-22 DIAGNOSIS — E1122 Type 2 diabetes mellitus with diabetic chronic kidney disease: Secondary | ICD-10-CM | POA: Insufficient documentation

## 2024-02-22 DIAGNOSIS — Z79811 Long term (current) use of aromatase inhibitors: Secondary | ICD-10-CM | POA: Insufficient documentation

## 2024-02-22 DIAGNOSIS — E119 Type 2 diabetes mellitus without complications: Secondary | ICD-10-CM | POA: Diagnosis not present

## 2024-02-22 DIAGNOSIS — C50912 Malignant neoplasm of unspecified site of left female breast: Secondary | ICD-10-CM | POA: Diagnosis not present

## 2024-02-22 DIAGNOSIS — Z923 Personal history of irradiation: Secondary | ICD-10-CM | POA: Diagnosis not present

## 2024-02-22 DIAGNOSIS — Z01818 Encounter for other preprocedural examination: Secondary | ICD-10-CM

## 2024-02-22 DIAGNOSIS — I10 Essential (primary) hypertension: Secondary | ICD-10-CM | POA: Diagnosis not present

## 2024-02-22 DIAGNOSIS — Z452 Encounter for adjustment and management of vascular access device: Secondary | ICD-10-CM | POA: Insufficient documentation

## 2024-02-22 DIAGNOSIS — E559 Vitamin D deficiency, unspecified: Secondary | ICD-10-CM | POA: Diagnosis not present

## 2024-02-22 DIAGNOSIS — R946 Abnormal results of thyroid function studies: Secondary | ICD-10-CM | POA: Diagnosis not present

## 2024-02-22 DIAGNOSIS — N189 Chronic kidney disease, unspecified: Secondary | ICD-10-CM | POA: Diagnosis not present

## 2024-02-22 DIAGNOSIS — R7309 Other abnormal glucose: Secondary | ICD-10-CM | POA: Diagnosis not present

## 2024-02-22 HISTORY — PX: PORT-A-CATH REMOVAL: SHX5289

## 2024-02-22 LAB — GLUCOSE, CAPILLARY
Glucose-Capillary: 107 mg/dL — ABNORMAL HIGH (ref 70–99)
Glucose-Capillary: 109 mg/dL — ABNORMAL HIGH (ref 70–99)

## 2024-02-22 SURGERY — REMOVAL PORT-A-CATH
Anesthesia: Monitor Anesthesia Care | Site: Chest

## 2024-02-22 MED ORDER — FENTANYL CITRATE (PF) 100 MCG/2ML IJ SOLN
INTRAMUSCULAR | Status: DC | PRN
  Administered 2024-02-22: 25 ug via INTRAVENOUS

## 2024-02-22 MED ORDER — HYDROMORPHONE HCL 1 MG/ML IJ SOLN: 0.2500 mg | INTRAMUSCULAR | Status: DC | PRN

## 2024-02-22 MED ORDER — PROPOFOL 500 MG/50ML IV EMUL
INTRAVENOUS | Status: DC | PRN
  Administered 2024-02-22 (×2): 30 ug via INTRAVENOUS
  Administered 2024-02-22: 150 ug/kg/min via INTRAVENOUS
  Administered 2024-02-22: 200 ug/kg/min via INTRAVENOUS

## 2024-02-22 MED ORDER — OXYCODONE HCL 5 MG PO TABS: 5.0000 mg | ORAL_TABLET | Freq: Once | ORAL | Status: DC | PRN

## 2024-02-22 MED ORDER — PROMETHAZINE HCL 25 MG/ML IJ SOLN: 12.5000 mg | INTRAMUSCULAR | Status: DC | PRN

## 2024-02-22 MED ORDER — DEXMEDETOMIDINE HCL IN NACL 200 MCG/50ML IV SOLN
INTRAVENOUS | Status: DC | PRN
Start: 2024-02-22 — End: 2024-02-22
  Administered 2024-02-22: 6 ug via INTRAVENOUS

## 2024-02-22 MED ORDER — FENTANYL CITRATE (PF) 100 MCG/2ML IJ SOLN
INTRAMUSCULAR | Status: AC
  Filled 2024-02-22: qty 2

## 2024-02-22 MED ORDER — BUPIVACAINE-EPINEPHRINE 0.25% -1:200000 IJ SOLN
INTRAMUSCULAR | Status: DC | PRN
  Administered 2024-02-22: 15 mL

## 2024-02-22 MED ORDER — OXYCODONE HCL 5 MG PO TABS: 5.0000 mg | ORAL_TABLET | Freq: Four times a day (QID) | ORAL | 0 refills | Status: DC | PRN

## 2024-02-22 MED ORDER — LIDOCAINE 2% (20 MG/ML) 5 ML SYRINGE
INTRAMUSCULAR | Status: DC | PRN
  Administered 2024-02-22: 60 mg via INTRAVENOUS

## 2024-02-22 MED ORDER — PROPOFOL 500 MG/50ML IV EMUL
INTRAVENOUS | Status: AC
  Filled 2024-02-22: qty 50

## 2024-02-22 MED ORDER — LACTATED RINGERS IV SOLN: INTRAVENOUS | Status: DC

## 2024-02-22 MED ORDER — OXYCODONE HCL 5 MG/5ML PO SOLN: 5.0000 mg | Freq: Once | ORAL | Status: DC | PRN

## 2024-02-22 SURGICAL SUPPLY — 21 items
BLADE SURG 15 STRL LF DISP TIS (BLADE) ×1 IMPLANT
CHLORAPREP W/TINT 26 (MISCELLANEOUS) ×1 IMPLANT
COVER BACK TABLE 60X90IN (DRAPES) ×1 IMPLANT
COVER MAYO STAND STRL (DRAPES) ×1 IMPLANT
DERMABOND ADVANCED .7 DNX12 (GAUZE/BANDAGES/DRESSINGS) ×1 IMPLANT
DRAPE LAPAROTOMY 100X72 PEDS (DRAPES) ×1 IMPLANT
DRAPE UTILITY XL STRL (DRAPES) ×1 IMPLANT
ELECT COATED BLADE 2.86 ST (ELECTRODE) IMPLANT
ELECTRODE REM PT RTRN 9FT ADLT (ELECTROSURGICAL) IMPLANT
GLOVE BIO SURGEON STRL SZ7.5 (GLOVE) ×1 IMPLANT
GOWN STRL REUS W/ TWL LRG LVL3 (GOWN DISPOSABLE) ×2 IMPLANT
NDL HYPO 25X1 1.5 SAFETY (NEEDLE) ×1 IMPLANT
NEEDLE HYPO 25X1 1.5 SAFETY (NEEDLE) ×1 IMPLANT
PACK BASIN DAY SURGERY FS (CUSTOM PROCEDURE TRAY) ×1 IMPLANT
PENCIL SMOKE EVACUATOR (MISCELLANEOUS) IMPLANT
SLEEVE SCD COMPRESS KNEE MED (STOCKING) IMPLANT
SPIKE FLUID TRANSFER (MISCELLANEOUS) ×1 IMPLANT
SUT MON AB 4-0 PC3 18 (SUTURE) ×1 IMPLANT
SUT VIC AB 3-0 SH 27X BRD (SUTURE) ×1 IMPLANT
SYR CONTROL 10ML LL (SYRINGE) ×1 IMPLANT
TOWEL GREEN STERILE FF (TOWEL DISPOSABLE) ×1 IMPLANT

## 2024-02-22 NOTE — Op Note (Signed)
 02/22/2024  3:57 PM  PATIENT:  Melanie VEAR Pa  78 y.o. female  PRE-OPERATIVE DIAGNOSIS:  LEFT BREAST CANCER  POST-OPERATIVE DIAGNOSIS:  LEFT BREAST CANCER  PROCEDURE:  Procedure(s): REMOVAL PORT-A-CATH (N/A)  SURGEON:  Surgeons and Role:    * Curvin Deward MOULD, MD - Primary  PHYSICIAN ASSISTANT:   ASSISTANTS: none   ANESTHESIA:   local and IV sedation  EBL:  minimal   BLOOD ADMINISTERED:none  DRAINS: none   LOCAL MEDICATIONS USED:  MARCAINE      SPECIMEN:  No Specimen  DISPOSITION OF SPECIMEN:  N/A  COUNTS:  YES  TOURNIQUET:  * No tourniquets in log *  DICTATION: .Dragon Dictation  After informed consent was obtained the patient was brought to the operating room and placed in the supine position on the operating room table.  After adequate IV sedation had been given the patient's right chest wall was prepped with ChloraPrep, allowed to dry, and draped in usual sterile manner.  An appropriate timeout was performed.  The area around the port was infiltrated with quarter percent Marcaine .  A small incision was made through her previous incision with a 15 blade knife.  The incision was carried through the subcutaneous tissue sharply with a 15 blade knife until the capsule surrounding the port was opened.  The 2 anchoring stitches were divided and removed.  The port was then pushed out of its pocket and with gentle traction was able to be removed from the patient without difficulty.  Pressure was held for several minutes until the area was completely hemostatic.  The tubing tract site was closed with a figure-of-eight 3-0 Vicryl stitch.  The subcutaneous tissue was closed with interrupted 3-0 Vicryl stitches.  The skin was closed with interrupted 4-0 Monocryl subcuticular stitches.  Dermabond dressings were applied.  The patient tolerated the procedure well.  At the end of the case all needle sponge and instrument counts were correct.  The patient was then awakened and taken recovery in  stable condition.  PLAN OF CARE: Discharge to home after PACU  PATIENT DISPOSITION:  PACU - hemodynamically stable.   Delay start of Pharmacological VTE agent (>24hrs) due to surgical blood loss or risk of bleeding: not applicable

## 2024-02-22 NOTE — Anesthesia Postprocedure Evaluation (Signed)
 Anesthesia Post Note  Patient: Melanie Welch  Procedure(s) Performed: REMOVAL PORT-A-CATH (Chest)     Patient location during evaluation: PACU Anesthesia Type: MAC Level of consciousness: awake and alert Pain management: pain level controlled Vital Signs Assessment: post-procedure vital signs reviewed and stable Respiratory status: spontaneous breathing, nonlabored ventilation and respiratory function stable Cardiovascular status: blood pressure returned to baseline and stable Postop Assessment: no apparent nausea or vomiting Anesthetic complications: no   No notable events documented.  Last Vitals:  Vitals:   02/22/24 1621 02/22/24 1627  BP: 133/83 133/83  Pulse: 87 87  Resp: 20 20  Temp: 36.7 C 36.7 C  SpO2: 97% 97%    Last Pain:  Vitals:   02/22/24 1627  TempSrc: Temporal  PainSc: 0-No pain                 Butler Levander Pinal

## 2024-02-22 NOTE — Anesthesia Preprocedure Evaluation (Signed)
 Anesthesia Evaluation  Patient identified by MRN, date of birth, ID band Patient awake    Reviewed: Allergy & Precautions, NPO status , Patient's Chart, lab work & pertinent test results  Airway Mallampati: II  TM Distance: >3 FB Neck ROM: Full    Dental no notable dental hx.    Pulmonary former smoker   Pulmonary exam normal        Cardiovascular hypertension, Pt. on medications Normal cardiovascular exam     Neuro/Psych negative neurological ROS  negative psych ROS   GI/Hepatic negative GI ROS, Neg liver ROS,,,  Endo/Other  diabetes, Oral Hypoglycemic Agents    Renal/GU Renal disease     Musculoskeletal negative musculoskeletal ROS (+)    Abdominal   Peds  Hematology negative hematology ROS (+)   Anesthesia Other Findings LEFT BREAST CANCER  Reproductive/Obstetrics                              Anesthesia Physical Anesthesia Plan  ASA: 3  Anesthesia Plan: MAC   Post-op Pain Management: Minimal or no pain anticipated   Induction: Intravenous  PONV Risk Score and Plan: 2 and Treatment may vary due to age or medical condition and Propofol  infusion  Airway Management Planned: Simple Face Mask  Additional Equipment:   Intra-op Plan:   Post-operative Plan:   Informed Consent: I have reviewed the patients History and Physical, chart, labs and discussed the procedure including the risks, benefits and alternatives for the proposed anesthesia with the patient or authorized representative who has indicated his/her understanding and acceptance.     Dental advisory given  Plan Discussed with: CRNA  Anesthesia Plan Comments:         Anesthesia Quick Evaluation

## 2024-02-22 NOTE — H&P (Signed)
 MRN: [id number] DOB: June 11, 1946 Subjective   Chief Complaint: Axilla recheck   History of Present Illness: Melanie Welch is a 78 y.o. female who is seen today for left breast cancer. The patient is a 78 year old black female who is about 8 months status post left mastectomy and sentinel node biopsy with targeted node dissection. Her margins were clean. She had 3 of 4 nodes positive. The cancer was ER and PR positive and HER2 negative with a Ki-67 of 50%. She is 5 months status post completion node dissection where only 1 more lymph node was found and was benign. She developed a postop seroma. She recently finished her radiation therapy. She does report some discomfort in the left axilla  Review of Systems: A complete review of systems was obtained from the patient. I have reviewed this information and discussed as appropriate with the patient. See HPI as well for other ROS.  ROS   Medical History: Past Medical History:  Diagnosis Date  Chronic kidney disease  Diabetes mellitus without complication (CMS/HHS-HCC)  History of cancer  Liver disease  Thyroid  disease   Patient Active Problem List  Diagnosis  Malignant neoplasm of upper-outer quadrant of left female breast (CMS/HHS-HCC)   Past Surgical History:  Procedure Laterality Date  LT MASTY, SN BX w/TARGETED NODE DISSECTION 05/06/2023  Dr. Curvin  HYSTERECTOMY    Allergies  Allergen Reactions  Atorvastatin Vomiting  VOMITING   Current Outpatient Medications on File Prior to Visit  Medication Sig Dispense Refill  anastrozole  (ARIMIDEX ) 1 mg tablet Take 1 mg by mouth once daily  amLODIPine  (NORVASC ) 10 MG tablet Take by mouth  ascorbic acid, vitamin C, (VITAMIN C) 250 MG tablet Take by mouth  cholecalciferol (VITAMIN D3) 400 unit tablet Take by mouth  coenzyme Q10 10 mg capsule Take by mouth  losartan  (COZAAR ) 100 MG tablet Take by mouth  metFORMIN  (GLUCOPHAGE ) 1000 MG tablet Take 1,000 mg by mouth 2 (two) times daily   multivitamin (MULTIVITAMIN) tablet Take by mouth  omega-3 fatty acids-fish oil 300-1,000 mg capsule Take by mouth   No current facility-administered medications on file prior to visit.   Family History  Problem Relation Age of Onset  Diabetes Sister    Social History   Tobacco Use  Smoking Status Former  Smokeless Tobacco Never    Social History   Socioeconomic History  Marital status: Single  Tobacco Use  Smoking status: Former  Smokeless tobacco: Never  Substance and Sexual Activity  Alcohol use: Never  Drug use: Never   Social Drivers of Corporate investment banker Strain: Low Risk (04/29/2021)  Received from [facility]  Overall Financial Resource Strain (CARDIA)  Difficulty of Paying Living Expenses: Not hard at all  Food Insecurity: Food Insecurity Present (09/13/2023)  Received from [facility]  Hunger Vital Sign  Within the past 12 months, you worried that your food would run out before you got the money to buy more.: Sometimes true  Within the past 12 months, the food you bought just didn't last and you didn't have money to get more.: Sometimes true  Transportation Needs: No Transportation Needs (06/25/2023)  Received from [facility] - Transportation  Lack of Transportation (Medical): No  Lack of Transportation (Non-Medical): No  Physical Activity: Inactive (09/23/2021)  Received from [facility]  Exercise Vital Sign  On average, how many days per week do you engage in moderate to strenuous exercise (like a brisk walk)?: 0 days  On average, how  many minutes do you engage in exercise at this level?: 0 min  Stress: No Stress Concern Present (04/29/2021)  Received from [facility] - Occupational Stress Questionnaire  Feeling of Stress : Not at all  Received from [facility]  [facility]  Housing Stability: Unknown (08/19/2023)  Housing Stability Vital Sign  Homeless in the Last Year: No    Objective:   Vitals:  PainSc: 0-No pain    There is no height or weight on file to calculate BMI.  Physical Exam Vitals reviewed.  Constitutional:  General: She is not in acute distress. Appearance: Normal appearance.  HENT:  Head: Normocephalic and atraumatic.  Right Ear: External ear normal.  Left Ear: External ear normal.  Nose: Nose normal.  Mouth/Throat:  Mouth: Mucous membranes are moist.  Pharynx: Oropharynx is clear.   Eyes:  General: No scleral icterus. Extraocular Movements: Extraocular movements intact.  Conjunctiva/sclera: Conjunctivae normal.  Pupils: Pupils are equal, round, and reactive to light.   Cardiovascular:  Rate and Rhythm: Normal rate and regular rhythm.  Pulses: Normal pulses.  Heart sounds: Normal heart sounds.  Pulmonary:  Effort: Pulmonary effort is normal. No respiratory distress.  Breath sounds: Normal breath sounds.  Abdominal:  General: Bowel sounds are normal.  Palpations: Abdomen is soft.  Tenderness: There is no abdominal tenderness.   Musculoskeletal:  General: No swelling, tenderness or deformity. Normal range of motion.  Cervical back: Normal range of motion and neck supple.   Skin: General: Skin is warm and dry.  Coloration: Skin is not jaundiced.   Neurological:  General: No focal deficit present.  Mental Status: She is alert and oriented to person, place, and time.   Psychiatric:  Mood and Affect: Mood normal.  Behavior: Behavior normal.     Breast: The left mastectomy incision is healing nicely with no sign of infection. The skin is healing well from the radiation. There does appear to be a seroma in the left axilla. The lateral chest wall was prepped with ChloraPrep and infiltrated with 1% lidocaine . I was able to aspirate 185 cc of serosanguineous fluid from the area. She tolerated this well.  Labs, Imaging and Diagnostic Testing:  Assessment and Plan:   Diagnoses and all orders for this  visit:  Malignant neoplasm of upper-outer quadrant of left female breast, unspecified estrogen receptor status (CMS/HHS-HCC)    The patient is about 8 months status post left mastectomy for breast cancer. I successfully aspirated 195 cc of serosanguineous fluid from the left axilla. She tolerated this well. At this point she will go back to physical therapy. I will plan to see her back in about 1 month to check for any residual fluid. I will also supply her with a prescription for postmastectomy bras.

## 2024-02-22 NOTE — Interval H&P Note (Signed)
 History and Physical Interval Note:  02/22/2024 3:20 PM  Melanie Welch  has presented today for surgery, with the diagnosis of LEFT BREAST CANCER.  The various methods of treatment have been discussed with the patient and family. After consideration of risks, benefits and other options for treatment, the patient has consented to  Procedure(s): REMOVAL PORT-A-CATH (N/A) as a surgical intervention.  The patient's history has been reviewed, patient examined, no change in status, stable for surgery.  I have reviewed the patient's chart and labs.  Questions were answered to the patient's satisfaction.     Deward Null III

## 2024-02-22 NOTE — Discharge Instructions (Addendum)

## 2024-02-22 NOTE — Transfer of Care (Signed)
 Immediate Anesthesia Transfer of Care Note  Patient: Melanie Welch  Procedure(s) Performed: REMOVAL PORT-A-CATH (Chest)  Patient Location: PACU  Anesthesia Type:MAC  Level of Consciousness: awake, alert , oriented, and patient cooperative  Airway & Oxygen Therapy: Patient Spontanous Breathing and Patient connected to face mask oxygen  Post-op Assessment: Report given to RN and Post -op Vital signs reviewed and stable  Post vital signs: Reviewed and stable  Last Vitals:  Vitals Value Taken Time  BP 122/75 02/22/24 16:00  Temp    Pulse 110 02/22/24 16:02  Resp 16 02/22/24 16:02  SpO2 100 % 02/22/24 16:02  Vitals shown include unfiled device data.  Last Pain:  Vitals:   02/22/24 1411  TempSrc: Temporal  PainSc: 0-No pain      Patients Stated Pain Goal: 3 (02/22/24 1411)  Complications: No notable events documented.

## 2024-02-23 ENCOUNTER — Encounter (HOSPITAL_BASED_OUTPATIENT_CLINIC_OR_DEPARTMENT_OTHER): Payer: Self-pay | Admitting: General Surgery

## 2024-03-02 ENCOUNTER — Telehealth: Payer: Self-pay

## 2024-03-02 ENCOUNTER — Encounter: Payer: Self-pay | Admitting: Adult Health

## 2024-03-02 ENCOUNTER — Inpatient Hospital Stay: Attending: Hematology and Oncology | Admitting: Adult Health

## 2024-03-02 VITALS — BP 137/73 | HR 82 | Temp 97.7°F | Resp 18 | Ht 68.0 in | Wt 164.0 lb

## 2024-03-02 DIAGNOSIS — Z79811 Long term (current) use of aromatase inhibitors: Secondary | ICD-10-CM | POA: Diagnosis not present

## 2024-03-02 DIAGNOSIS — Z1211 Encounter for screening for malignant neoplasm of colon: Secondary | ICD-10-CM | POA: Diagnosis not present

## 2024-03-02 DIAGNOSIS — Z923 Personal history of irradiation: Secondary | ICD-10-CM | POA: Insufficient documentation

## 2024-03-02 DIAGNOSIS — N644 Mastodynia: Secondary | ICD-10-CM

## 2024-03-02 DIAGNOSIS — Z9221 Personal history of antineoplastic chemotherapy: Secondary | ICD-10-CM | POA: Diagnosis not present

## 2024-03-02 DIAGNOSIS — Z170 Estrogen receptor positive status [ER+]: Secondary | ICD-10-CM | POA: Diagnosis not present

## 2024-03-02 DIAGNOSIS — I890 Lymphedema, not elsewhere classified: Secondary | ICD-10-CM | POA: Insufficient documentation

## 2024-03-02 DIAGNOSIS — Z8041 Family history of malignant neoplasm of ovary: Secondary | ICD-10-CM | POA: Insufficient documentation

## 2024-03-02 DIAGNOSIS — M7989 Other specified soft tissue disorders: Secondary | ICD-10-CM | POA: Insufficient documentation

## 2024-03-02 DIAGNOSIS — C50412 Malignant neoplasm of upper-outer quadrant of left female breast: Secondary | ICD-10-CM | POA: Insufficient documentation

## 2024-03-02 DIAGNOSIS — Z87891 Personal history of nicotine dependence: Secondary | ICD-10-CM | POA: Diagnosis not present

## 2024-03-02 DIAGNOSIS — Z9012 Acquired absence of left breast and nipple: Secondary | ICD-10-CM | POA: Diagnosis not present

## 2024-03-02 NOTE — Telephone Encounter (Signed)
 Per md orders entered for [employer] and all supported documents faxed to [phone]. Faxed confirmation was received.

## 2024-03-02 NOTE — Progress Notes (Signed)
 SURVIVORSHIP VISIT:  BRIEF ONCOLOGIC HISTORY:  Oncology History  Malignant neoplasm of upper-outer quadrant of left breast in female, estrogen receptor positive (HCC)  03/30/2023 Initial Diagnosis   Palpable concern left axilla, mammogram showed 0.5 cm mass left breast 1:30 position possibly an intramammary lymph node, at the site of palpable concern there was an abnormal lymph node.  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 The recommendation was for a discussion about ALND.  Dr. Odean plans to discuss adj chemo with her also. He is seeing her 11/4.  Looks like she is scheduled for post op appt with Dr. Curvin 10/24.    06/06/2023 Cancer Staging   Staging form: Breast, AJCC 8th Edition - Pathologic: Stage IIA (pT2, pN1(sn), cM0, G3, ER+, PR+, HER2-) - Signed by Odean Potts, MD on 06/06/2023 Stage prefix: Initial diagnosis Method of lymph node assessment: Sentinel lymph node biopsy Histologic grading system: 3 grade system   06/20/2023 - 08/24/2023 Chemotherapy   Patient is on Treatment Plan : BREAST TC q21d     09/27/2023 - 11/10/2023 Radiation Therapy   Plan Name: CW_L_BH_BO Site: Chest Wall, Left Technique: 3D Mode: Photon Dose Per Fraction: 1.8 Gy Prescribed Dose (Delivered / Prescribed): 25.2 Gy / 25.2 Gy Prescribed Fxs (Delivered / Prescribed): 14 / 14   Plan Name: CW_L_SCV_BH Site: Chest Wall, Left Technique: 3D Mode: Photon Dose Per Fraction: 1.8 Gy Prescribed Dose (Delivered / Prescribed): 50.4 Gy / 50.4 Gy Prescribed Fxs  (Delivered / Prescribed): 28 / 28   Plan Name: CW_L_Bst_BO Site: Chest Wall, Left Technique: Electron Mode: Electron Dose Per Fraction: 2 Gy Prescribed Dose (Delivered / Prescribed): 10 Gy / 10 Gy Prescribed Fxs (Delivered / Prescribed): 5 / 5   Plan Name: CW_L_BH Site: Chest Wall, Left Technique: 3D Mode: Photon Dose Per Fraction: 1.8 Gy Prescribed Dose (Delivered / Prescribed): 25.2 Gy / 25.2 Gy Prescribed Fxs (Delivered / Prescribed): 14 / 14   12/2023 -  Anti-estrogen oral therapy   Anastrozole  x 5-7 years     INTERVAL HISTORY:  Discussed the use of AI scribe software for clinical note transcription with the patient, who gave verbal consent to proceed.  History of Present Illness Melanie Welch is a 78 year old female with stage 2A breast cancer who presents for follow-up care.  She has estrogen and progesterone receptor positive breast cancer. She underwent a mastectomy with four lymph nodes removed, three of which were positive. She experiences lymphedema and is interested in obtaining a compression sleeve. She has completed chemotherapy and radiation therapy. Current symptoms include tiredness, skin discoloration, and occasional swelling. She takes anastrozole  and experiences hot flashes, joint aches, and pains. She has not had a bone density test since 2017.  Her mother had ovarian cancer, which is relevant for genetic counseling consideration. A diagnostic mammogram has been ordered due to itching in her right breast, the remaining breast after mastectomy.    REVIEW OF SYSTEMS:  Review of Systems  Constitutional:  Negative for appetite change, chills, fatigue, fever and unexpected weight change.  HENT:  Negative for hearing loss, lump/mass and trouble swallowing.   Eyes:  Negative for eye problems and icterus.  Respiratory:  Negative for chest tightness, cough and shortness of breath.   Cardiovascular:  Negative for chest pain, leg swelling and palpitations.   Gastrointestinal:  Negative for abdominal distention, abdominal pain, constipation, diarrhea, nausea and vomiting.  Endocrine: Negative for hot flashes.  Genitourinary:  Negative for difficulty urinating.   Musculoskeletal:  Negative for arthralgias.  Skin:  Negative for itching and rash.  Neurological:  Negative for dizziness, extremity weakness, headaches and numbness.  Hematological:  Negative for adenopathy. Does not bruise/bleed easily.  Psychiatric/Behavioral:  Negative for depression. The patient is not nervous/anxious.    Breast: Denies any new nodularity, masses, tenderness, nipple changes, or nipple discharge.       PAST MEDICAL/SURGICAL HISTORY:  Past Medical History:  Diagnosis Date   Breast cancer (HCC) 03/30/2023   Colonic adenoma 12/2018   ONE   Diabetes mellitus    Fatty liver    Hypertension    MGUS (monoclonal gammopathy of unknown significance)    Port-A-Cath in place 07/05/2023   Proteinuria    Past Surgical History:  Procedure Laterality Date   ABDOMINAL HYSTERECTOMY     AXILLARY LYMPH NODE DISSECTION Left 06/10/2023   Procedure: COMPLETION LEFT AXILLARY LYMPH NODE DISSECTION;  Surgeon: Curvin Deward MOULD, MD;  Location: [facility];  Service: General;  Laterality: Left;   BREAST BIOPSY  2005   left   BREAST BIOPSY Left 03/30/2023   US  LT BREAST BX W LOC DEV EA ADD LESION IMG BX SPEC US  GUIDE 03/30/2023 [facility]   BREAST BIOPSY Left 03/30/2023   MM LT BREAST BX W LOC DEV 1ST LESION IMAGE BX SPEC STEREO GUIDE 03/30/2023 [facility]   BREAST BIOPSY Left 03/30/2023   US  LT BREAST BX W LOC DEV 1ST LESION IMG BX SPEC US  GUIDE 03/30/2023 [facility]   BREAST BIOPSY Left 05/05/2023   US  LT RADIOACTIVE SEED LOC 05/05/2023 [facility]   CATARACT EXTRACTION W/PHACO Right 05/10/2017   Procedure: CATARACT EXTRACTION PHACO AND INTRAOCULAR LENS PLACEMENT (IOC);  Surgeon: Roz Anes, MD;  Location: [facility];  Service: Ophthalmology;   Laterality: Right;  CDE: 5.04   CATARACT EXTRACTION W/PHACO Left 05/24/2017   Procedure: CATARACT EXTRACTION PHACO AND INTRAOCULAR LENS PLACEMENT (IOC);  Surgeon: Roz Anes, MD;  Location: [facility];  Service: Ophthalmology;  Laterality: Left;  CDE: 8.40   COLONOSCOPY  07/13/2011   Procedure: COLONOSCOPY;  Surgeon: Margo CHRISTELLA Haddock, MD;  Location: [facility];  Service: Endoscopy;  Laterality: N/A;  9:30 AM   COLONOSCOPY N/A 12/09/2018   diverticulosis, suspect diverticulum in mid sigmoid s/p epi, unable to place clip. 5 mm benign polyp. Colonic lipoma. Normal TI. Many diverticula occluded with stool.    NODE DISSECTION Left 05/06/2023   Procedure: TARGETED NODE DISSECTION;  Surgeon: Curvin Deward MOULD, MD;  Location: [facility] OR;  Service: General;  Laterality: Left;   POLYPECTOMY  12/09/2018   Procedure: POLYPECTOMY;  Surgeon: Shaaron Lamar CHRISTELLA, MD;  Location: [facility];  Service: Endoscopy;;  splenic flexure   PORT-A-CATH REMOVAL N/A 02/22/2024   Procedure: REMOVAL PORT-A-CATH;  Surgeon: Curvin Deward MOULD, MD;  Location: [facility];  Service: General;  Laterality: N/A;   PORTACATH PLACEMENT N/A 06/10/2023   Procedure: INSERTION PORT-A-CATH WITH ULTRASOUND GUIDANCE;  Surgeon: Curvin Deward MOULD, MD;  Location: [facility];  Service: General;  Laterality: N/A;   SIMPLE MASTECTOMY  WITH AXILLARY SENTINEL NODE BIOPSY Left 05/06/2023   Procedure: LEFT MASTECTOMY AND SENTINEL NODE BIOPSY;  Surgeon: Curvin Deward MOULD, MD;  Location: [facility];  Service: General;  Laterality: Left;     ALLERGIES:  Allergies  Allergen Reactions   Lipitor [Atorvastatin] Nausea And Vomiting     CURRENT MEDICATIONS:  Outpatient Encounter Medications as of 03/02/2024  Medication Sig   amLODipine  (NORVASC ) 10 MG tablet Take 10 mg by mouth at bedtime.    anastrozole  (ARIMIDEX ) 1 MG tablet Take 1 tablet (1 mg total) by mouth daily.   losartan  (COZAAR ) 100 MG tablet Take 100 mg by mouth daily.   metFORMIN   (GLUCOPHAGE ) 1000 MG tablet Take 1,000 mg by mouth 2 (two) times daily with a meal.   oxyCODONE  (ROXICODONE ) 5 MG immediate release tablet Take 1 tablet (5 mg total) by mouth every 6 (six) hours as needed.   No facility-administered encounter medications on file as of 03/02/2024.     ONCOLOGIC FAMILY HISTORY:  Family History  Problem Relation Age of Onset   Ovarian cancer Mother    Multiple myeloma Sister    Colon cancer Neg Hx      SOCIAL HISTORY:  Social History   Socioeconomic History   Marital status: Divorced    Spouse name: Not on file   Number of children: Not on file   Years of education: Not on file   Highest education level: Not on file  Occupational History   Not on file  Tobacco Use   Smoking status: Former   Smokeless tobacco: Never  Vaping Use   Vaping status: Never Used  Substance and Sexual Activity   Alcohol use: Yes    Comment: rare wine   Drug use: No   Sexual activity: Not Currently  Other Topics Concern   Not on file  Social History Narrative   Not on file   Social Drivers of Health   Financial Resource Strain: Low Risk  (04/29/2021)   Received from [facility]   Overall Financial Resource Strain (CARDIA)    Difficulty of Paying Living Expenses: Not hard at all  Food Insecurity: Food Insecurity Present (09/13/2023)   Hunger Vital Sign    Worried About Running Out of Food in the Last Year: Sometimes true    Ran Out of Food in the Last Year: Sometimes true  Transportation Needs: No Transportation Needs (06/25/2023)   PRAPARE - Administrator, Civil Service (Medical): No    Lack of Transportation (Non-Medical): No  Physical Activity: Inactive (09/23/2021)   Received from [facility]   Exercise Vital Sign    On average, how many days per week do you engage in moderate to strenuous exercise (like a brisk walk)?: 0 days    On average, how many minutes do you engage in exercise at this level?: 0 min  Stress: No Stress Concern  Present (04/29/2021)   Received from [facility] Occupational [facility] - Occupational Stress Questionnaire    Feeling of Stress : Not at all  Social Connections: Unknown (12/15/2021)   Received from [facility]   Social Network    Social Network: Not on file  Intimate Partner Violence: Not At Risk (06/25/2023)   Humiliation, Afraid, Rape, and Kick questionnaire    Fear of Current or Ex-Partner: No    Emotionally Abused: No    Physically Abused: No    Sexually Abused: No     OBSERVATIONS/OBJECTIVE:  BP  137/73 (BP Location: Right Arm, Patient Position: Sitting)   Pulse 82   Temp 97.7 F (36.5 C) (Tympanic)   Resp 18   Ht 5' 8 (1.727 m)   Wt 164 lb (74.4 kg)   SpO2 99%   BMI 24.94 kg/m  GENERAL: Patient is a well appearing female in no acute distress HEENT:  Sclerae anicteric.  Oropharynx clear and moist. No ulcerations or evidence of oropharyngeal candidiasis. Neck is supple.  NODES:  No cervical, supraclavicular, or axillary lymphadenopathy palpated.  BREAST EXAM:  left breast s/p mastectomy and radiation, no sign of local recurrence, right breast benign LUNGS:  Clear to auscultation bilaterally.  No wheezes or rhonchi. HEART:  Regular rate and rhythm. No murmur appreciated. ABDOMEN:  Soft, nontender.  Positive, normoactive bowel sounds. No organomegaly palpated. MSK:  No focal spinal tenderness to palpation. Full range of motion bilaterally in the upper extremities. EXTREMITIES:  + left arm swelling  SKIN:  Clear with no obvious rashes or skin changes. No nail dyscrasia. NEURO:  Nonfocal. Well oriented.  Appropriate affect.   LABORATORY DATA:  None for this visit.  DIAGNOSTIC IMAGING:  None for this visit.      ASSESSMENT AND PLAN:  Ms.. Raap is a pleasant 78 y.o. female with Stage IIA left breast invasive ductal carcinoma, ER+/PR+/HER2-, diagnosed in 04/2023, treated with mastectomy, adjuvant chemotherapy, adjuvant radiation therapy, and  anti-estrogen therapy with Anastrozole  beginning in 12/2023.  She presents to the Survivorship Clinic for our initial meeting and routine follow-up post-completion of treatment for breast cancer.    1. Stage IIA left breast cancer:  Ms. Debnam is continuing to recover from definitive treatment for breast cancer. She will follow-up with her medical oncologist, Dr. Odean in 6 months with history and physical exam per surveillance protocol.  She will continue her anti-estrogen therapy with Anastrozole . Thus far, she is tolerating the Anastrozole  well, with minimal side effects. She is due for right bresat mammogram.  Due to itching, I ordered diagnostic mammogram and ultrasound.   Today, a comprehensive survivorship care plan and treatment summary was reviewed with the patient today detailing her breast cancer diagnosis, treatment course, potential late/long-term effects of treatment, appropriate follow-up care with recommendations for the future, and patient education resources.  A copy of this summary, along with a letter will be sent to the patient's primary care provider via mail/fax/In Basket message after today's visit.    2. Bone health:  Given Ms. Mcquiston's age/history of breast cancer and her current treatment regimen including anti-estrogen therapy with Anastrozole , she is at risk for bone demineralization. She is due for DEXA; orders placed.  She was given education on specific activities to promote bone health.  3. Cancer screening:  Due to Ms. Cameron's history and her age, she should receive screening for skin cancers, colon cancer, and gynecologic cancers.  She wants to talk to GI in [facility] for colonoscopy.  Referral placed to [facility]. The information and recommendations are listed on the patient's comprehensive care plan/treatment summary and were reviewed in detail with the patient.    4. Health maintenance and wellness promotion: Ms. Kropp was encouraged to consume 5-7 servings of fruits and  vegetables per day. We reviewed the [employer] handout.  She was also encouraged to engage in moderate to vigorous exercise for 30 minutes per day most days of the week.  She was instructed to limit her alcohol consumption and continue to abstain from tobacco use.     5. Support services/counseling:  It is not uncommon for this period of the patient's cancer care trajectory to be one of many emotions and stressors.   She was given information regarding our available services and encouraged to contact me with any questions or for help enrolling in any of our support group/programs.   6. Left arm swelling: Referral to PT placed.   Follow up instructions:    -Return to cancer center 6 months for f/u with Dr. Odean  -Mammogram due in 03/2024 (ordered diagnostic at the breast center) -Refer to [facility] for colonoscopy evaluation  -Order bone density testing at [facility] Blood [facility] -She is welcome to return back to the Survivorship Clinic at any time; no additional follow-up needed at this time.  -Consider referral back to survivorship as a long-term survivor for continued surveillance  The patient was provided an opportunity to ask questions and all were answered. The patient agreed with the plan and demonstrated an understanding of the instructions.   Total encounter time:60 minutes*in face-to-face visit time, chart review, lab review, care coordination, order entry, and documentation of the encounter time.    Morna Kendall, NP 03/02/24 6:22 PM Medical Oncology and Hematology [facility] [address] [place], [place] [address] Tel. [phone]    Fax. [phone]  *Total Encounter Time as defined by the Centers for Medicare and Medicaid Services includes, in addition to the face-to-face time of a patient visit (documented in the note above) non-face-to-face time: obtaining and reviewing outside history, ordering and reviewing  medications, tests or procedures, care coordination (communications with other health care professionals or caregivers) and documentation in the medical record.

## 2024-03-06 ENCOUNTER — Inpatient Hospital Stay

## 2024-03-06 ENCOUNTER — Inpatient Hospital Stay: Admitting: Genetic Counselor

## 2024-03-06 ENCOUNTER — Other Ambulatory Visit: Payer: Self-pay | Admitting: Genetic Counselor

## 2024-03-06 DIAGNOSIS — Z8041 Family history of malignant neoplasm of ovary: Secondary | ICD-10-CM

## 2024-03-06 DIAGNOSIS — Z1379 Encounter for other screening for genetic and chromosomal anomalies: Secondary | ICD-10-CM

## 2024-03-06 DIAGNOSIS — Z923 Personal history of irradiation: Secondary | ICD-10-CM | POA: Diagnosis not present

## 2024-03-06 DIAGNOSIS — Z170 Estrogen receptor positive status [ER+]: Secondary | ICD-10-CM | POA: Diagnosis not present

## 2024-03-06 DIAGNOSIS — Z9221 Personal history of antineoplastic chemotherapy: Secondary | ICD-10-CM | POA: Diagnosis not present

## 2024-03-06 DIAGNOSIS — C50412 Malignant neoplasm of upper-outer quadrant of left female breast: Secondary | ICD-10-CM

## 2024-03-06 DIAGNOSIS — Z803 Family history of malignant neoplasm of breast: Secondary | ICD-10-CM | POA: Diagnosis not present

## 2024-03-06 DIAGNOSIS — I890 Lymphedema, not elsewhere classified: Secondary | ICD-10-CM | POA: Diagnosis not present

## 2024-03-06 DIAGNOSIS — Z87891 Personal history of nicotine dependence: Secondary | ICD-10-CM | POA: Diagnosis not present

## 2024-03-06 DIAGNOSIS — Z9012 Acquired absence of left breast and nipple: Secondary | ICD-10-CM | POA: Diagnosis not present

## 2024-03-06 DIAGNOSIS — Z79811 Long term (current) use of aromatase inhibitors: Secondary | ICD-10-CM | POA: Diagnosis not present

## 2024-03-06 LAB — GENETIC SCREENING ORDER

## 2024-03-07 ENCOUNTER — Encounter: Payer: Self-pay | Admitting: Genetic Counselor

## 2024-03-07 NOTE — Progress Notes (Signed)
 REFERRING PROVIDER: Odean Potts, MD  PRIMARY PROVIDER:  Marvine Rush, MD  PRIMARY REASON FOR VISIT:  1. Malignant neoplasm of upper-outer quadrant of left breast in female, estrogen receptor positive (HCC)   2. Family history of ovarian cancer   3. Family history of breast cancer    HISTORY OF PRESENT ILLNESS:   Ms. Woolford, a 78 y.o. female, was seen for a [facility] cancer genetics consultation at the request of Dr. Odean due to a personal and family history of cancer.  Ms. Klare presents to clinic today to discuss the possibility of a hereditary predisposition to cancer, to discuss genetic testing, and to further clarify her future cancer risks, as well as potential cancer risks for family members.   Ms. 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  CANCER HISTORY:  Oncology History  Malignant neoplasm of upper-outer quadrant of left breast in female, estrogen receptor positive (HCC)  03/30/2023 Initial Diagnosis   Palpable concern left axilla, mammogram showed 0.5 cm mass left breast 1:30 position possibly an intramammary lymph node, at the site of palpable concern there was an abnormal lymph node.  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 The recommendation was for a discussion about ALND.  Dr. Odean plans to discuss adj chemo with her also. He is seeing her 11/4.  Looks like she is scheduled for post op appt with Dr. Curvin 10/24.    06/06/2023 Cancer Staging   Staging form: Breast, AJCC  8th Edition - Pathologic: Stage IIA (pT2, pN1(sn), cM0, G3, ER+, PR+, HER2-) - Signed by Odean Potts, MD on 06/06/2023 Stage prefix: Initial diagnosis Method of lymph node assessment: Sentinel lymph node biopsy Histologic grading system: 3 grade system   06/20/2023 - 08/24/2023 Chemotherapy   Patient is on Treatment Plan : BREAST TC q21d     09/27/2023 - 11/10/2023 Radiation Therapy   Plan Name: CW_L_BH_BO Site: Chest Wall, Left Technique: 3D Mode: Photon Dose Per Fraction: 1.8 Gy Prescribed Dose (Delivered / Prescribed): 25.2 Gy / 25.2 Gy Prescribed Fxs (Delivered / Prescribed): 14 / 14   Plan Name: CW_L_SCV_BH Site: Chest Wall, Left Technique: 3D Mode: Photon Dose Per Fraction: 1.8 Gy Prescribed Dose (Delivered / Prescribed): 50.4 Gy / 50.4 Gy Prescribed Fxs (Delivered / Prescribed): 28 / 28   Plan Name: CW_L_Bst_BO Site: Chest Wall, Left Technique: Electron Mode: Electron Dose Per Fraction: 2 Gy Prescribed Dose (Delivered / Prescribed): 10 Gy / 10 Gy Prescribed Fxs (Delivered / Prescribed): 5 / 5   Plan Name: CW_L_BH Site: Chest Wall, Left Technique: 3D Mode: Photon Dose Per Fraction: 1.8 Gy Prescribed Dose (Delivered / Prescribed): 25.2 Gy / 25.2 Gy Prescribed Fxs (Delivered / Prescribed): 14 / 14   12/2023 -  Anti-estrogen oral therapy   Anastrozole  x 5-7 years      RISK FACTORS:  First live birth at age 75.  OCP use for approximately 5 years.  Ovaries intact: yes.  Uterus intact: no.  Menopausal status: postmenopausal.  HRT use: 0 years. Colonoscopy: yes;  reports a history of a couple polyps. Any excessive radiation exposure in the past: no  Past Medical History:  Diagnosis Date   Breast cancer (HCC) 03/30/2023   Colonic adenoma 12/2018   ONE   Diabetes mellitus    Fatty liver    Hypertension    MGUS (monoclonal gammopathy of unknown significance)    Port-A-Cath in place 07/05/2023   Proteinuria     Past Surgical History:  Procedure Laterality  Date   ABDOMINAL HYSTERECTOMY     AXILLARY LYMPH NODE DISSECTION Left 06/10/2023   Procedure: COMPLETION LEFT AXILLARY LYMPH NODE DISSECTION;  Surgeon: Curvin Deward MOULD, MD;  Location: [facility];  Service: General;  Laterality: Left;   BREAST BIOPSY  2005   left   BREAST BIOPSY Left 03/30/2023   US  LT BREAST BX W LOC DEV EA ADD LESION IMG BX SPEC US  GUIDE 03/30/2023 [facility]   BREAST BIOPSY Left 03/30/2023   MM LT BREAST BX W LOC DEV 1ST LESION IMAGE BX SPEC STEREO GUIDE 03/30/2023 [facility]   BREAST BIOPSY Left 03/30/2023   US  LT BREAST BX W LOC DEV 1ST LESION IMG BX SPEC US  GUIDE 03/30/2023 [facility]   BREAST BIOPSY Left 05/05/2023   US  LT RADIOACTIVE SEED LOC 05/05/2023 [facility]   CATARACT EXTRACTION W/PHACO Right 05/10/2017   Procedure: CATARACT EXTRACTION PHACO AND INTRAOCULAR LENS PLACEMENT (IOC);  Surgeon: Roz Anes, MD;  Location: [facility];  Service: Ophthalmology;  Laterality: Right;  CDE: 5.04   CATARACT EXTRACTION W/PHACO Left 05/24/2017   Procedure: CATARACT EXTRACTION PHACO AND INTRAOCULAR LENS PLACEMENT (IOC);  Surgeon: Roz Anes, MD;  Location: [facility];  Service: Ophthalmology;  Laterality: Left;  CDE: 8.40   COLONOSCOPY  07/13/2011   Procedure: COLONOSCOPY;  Surgeon: Margo CHRISTELLA Haddock, MD;  Location: [facility];  Service: Endoscopy;  Laterality: N/A;  9:30 AM   COLONOSCOPY N/A 12/09/2018   diverticulosis, suspect diverticulum in mid sigmoid s/p epi, unable to place clip. 5 mm benign polyp. Colonic lipoma. Normal TI. Many diverticula occluded with stool.    NODE DISSECTION Left 05/06/2023   Procedure: TARGETED NODE DISSECTION;  Surgeon: Curvin Deward MOULD, MD;  Location: [facility];  Service: General;  Laterality: Left;   POLYPECTOMY  12/09/2018   Procedure: POLYPECTOMY;  Surgeon: Shaaron Lamar CHRISTELLA, MD;  Location: [facility];  Service: Endoscopy;;  splenic flexure   PORT-A-CATH REMOVAL N/A 02/22/2024   Procedure: REMOVAL PORT-A-CATH;   Surgeon: Curvin Deward MOULD, MD;  Location: [facility];  Service: General;  Laterality: N/A;   PORTACATH PLACEMENT N/A 06/10/2023   Procedure: INSERTION PORT-A-CATH WITH ULTRASOUND GUIDANCE;  Surgeon: Curvin Deward MOULD, MD;  Location: [facility];  Service: General;  Laterality: N/A;   SIMPLE MASTECTOMY WITH AXILLARY SENTINEL NODE BIOPSY Left 05/06/2023   Procedure: LEFT MASTECTOMY AND SENTINEL NODE BIOPSY;  Surgeon: Curvin Deward MOULD, MD;  Location: [facility];  Service: General;  Laterality: Left;    Social History   Socioeconomic History   Marital status: Divorced    Spouse name: Not on file   Number of children: Not on file   Years of education: Not on file   Highest education level: Not on file  Occupational History   Not on file  Tobacco Use   Smoking status: Former   Smokeless tobacco: Never  Vaping Use   Vaping status: Never Used  Substance and Sexual Activity   Alcohol use: Yes    Comment: rare wine  Drug use: No   Sexual activity: Not Currently  Other Topics Concern   Not on file  Social History Narrative   Not on file   Social Drivers of Health   Financial Resource Strain: Low Risk  (04/29/2021)   Received from [facility]   Overall Financial Resource Strain (CARDIA)    Difficulty of Paying Living Expenses: Not hard at all  Food Insecurity: Food Insecurity Present (09/13/2023)   Hunger Vital Sign    Worried About Running Out of Food in the Last Year: Sometimes true    Ran Out of Food in the Last Year: Sometimes true  Transportation Needs: No Transportation Needs (06/25/2023)   PRAPARE - Administrator, Civil Service (Medical): No    Lack of Transportation (Non-Medical): No  Physical Activity: Inactive (09/23/2021)   [facility]   Exercise Vital Sign    On average, how many days per week do you engage in moderate to strenuous exercise (like a brisk walk)?: 0 days    On average, how many minutes do you engage in  exercise at this level?: 0 min  Stress: No Stress Concern Present (04/29/2021)   Received from [facility] - Occupational Stress Questionnaire    Feeling of Stress : Not at all  Social Connections: Unknown (12/15/2021)   Received from [facility]   Social Network    Social Network: Not on file     FAMILY HISTORY:  We obtained a detailed, 4-generation family history.  Significant diagnoses are listed below: Family History  Problem Relation Age of Onset   Ovarian cancer Mother 32 - 36   Multiple myeloma Sister 57 - 32   Cancer Maternal Uncle        unknown type   Cancer Maternal Uncle        unknown type   Cancer Paternal Aunt        unknown type   Cancer Half-Sister        unknown type dx. <50, paternal   Breast cancer Paternal Cousin        first cousin   Colon cancer Neg Hx      Ms. Federici is unaware of previous family history of genetic testing for hereditary cancer risks. There is no reported Ashkenazi Jewish ancestry.   GENETIC COUNSELING ASSESSMENT: Ms. Nudelman is a 78 y.o. female with a personal and family history of cancer which is somewhat suggestive of a hereditary predisposition to cancer. We, therefore, discussed and recommended the following at today's visit.   DISCUSSION: We discussed that 5 - 10% of cancer is hereditary, with most cases of breast cancer associated with BRCA1/2.  There are other genes that can be associated with hereditary breast cancer syndromes.  We discussed that testing is beneficial for several reasons including knowing how to follow individuals after completing their treatment, identifying whether potential treatment options would be beneficial, and understanding if other family members could be at risk for cancer and allowing them to undergo genetic testing.   We reviewed the characteristics, features and inheritance patterns of hereditary cancer syndromes. We also discussed genetic testing,  including the appropriate family members to test, the process of testing, insurance coverage and turn-around-time for results. We discussed the implications of a negative, positive, carrier and/or variant of uncertain significant result. We recommended Ms. Timmins pursue genetic testing for a panel that includes genes associated with breast and ovarian cancer.  Ms. Bronkema  was offered a common hereditary cancer panel (40 genes) and an expanded pan-cancer panel (77 genes). Ms. Bartram was informed of the benefits and limitations of each panel, including that expanded pan-cancer panels contain genes that do not have clear management guidelines at this point in time.  We also discussed that as the number of genes included on a panel increases, the chances of variants of uncertain significance increases. After considering the benefits and limitations of each gene panel, Ms. Ledesma elected to have [facility] CancerNext-Expanded Panel.  The CancerNext-Expanded gene panel offered by [facility] and includes sequencing, rearrangement, and RNA analysis for the following 77 genes: AIP, ALK, APC, ATM, AXIN2, BAP1, BARD1, BMPR1A, BRCA1, BRCA2, BRIP1, CDC73, CDH1, CDK4, CDKN1B, CDKN2A, CEBPA, CHEK2, CTNNA1, DDX41, DICER1, ETV6, FH, FLCN, GATA2, LZTR1, MAX, MBD4, MEN1, MET, MLH1, MSH2, MSH3, MSH6, MUTYH, NF1, NF2, NTHL1, PALB2, PHOX2B, PMS2, POT1, PRKAR1A, PTCH1, PTEN, RAD51C, RAD51D, RB1, RET, RPS20, RUNX1, SDHA, SDHAF2, SDHB, SDHC, SDHD, SMAD4, SMARCA4, SMARCB1, SMARCE1, STK11, SUFU, TMEM127, TP53, TSC1, TSC2, VHL, and WT1 (sequencing and deletion/duplication); EGFR, HOXB13, KIT, MITF, PDGFRA, POLD1, and POLE (sequencing only); EPCAM and GREM1 (deletion/duplication only).   Based on Ms. Porras's personal and family history of cancer, she meets medical criteria for genetic testing. Despite that she meets criteria, she may still have an out of pocket cost. We discussed that if her out of pocket cost for testing is over $100, the  laboratory should contact them to discuss self-pay prices, patient pay assistance programs, if applicable, and other billing options.  PLAN: After considering the risks, benefits, and limitations, Ms. Traum provided informed consent to pursue genetic testing and the blood sample was sent to [facility] for analysis of the CancerNext-Expanded Panel. Results should be available within approximately 2-3 weeks' time, at which point they will be disclosed by telephone to Ms. Trombly, as will any additional recommendations warranted by these results. Ms. Tangen will receive a summary of her genetic counseling visit and a copy of her results once available. This information will also be available in Epic.   Ms. Paolella questions were answered to her satisfaction today. Our contact information was provided should additional questions or concerns arise. Thank you for the referral and allowing us  to share in the care of your patient.   Johna Kearl, MS, [facility] Genetic Counselor [email or web address] (P) [phone]  40 minutes were spent on the date of the encounter in service to the patient including preparation, face-to-face consultation, documentation and care coordination. The patient was seen alone.  Drs. Gudena and/or Lanny were available to discuss this case as needed.   _______________________________________________________________________ For Office Staff:  Number of people involved in session: 1 Was an Intern/ student involved with case: no

## 2024-03-08 NOTE — Therapy (Incomplete)
 OUTPATIENT PHYSICAL THERAPY  UPPER EXTREMITY ONCOLOGY EVALUATION  Patient Name: Melanie Welch MRN: [id number] DOB:02-Jul-1946, 77 y.o., female Today's Date: 03/09/2024  END OF SESSION:  PT End of Session - 03/09/24 0904     Visit Number 1    Number of Visits 9    Date for PT Re-Evaluation 04/06/24    PT Start Time 1001    PT Stop Time 1049    PT Time Calculation (min) 48 min    Activity Tolerance Patient tolerated treatment well    Behavior During Therapy [facility] for tasks assessed/performed           Past Medical History:  Diagnosis Date   Breast cancer (HCC) 03/30/2023   Colonic adenoma 12/2018   ONE   Diabetes mellitus    Fatty liver    Hypertension    MGUS (monoclonal gammopathy of unknown significance)    Port-A-Cath in place 07/05/2023   Proteinuria    Past Surgical History:  Procedure Laterality Date   ABDOMINAL HYSTERECTOMY     AXILLARY LYMPH NODE DISSECTION Left 06/10/2023   Procedure: COMPLETION LEFT AXILLARY LYMPH NODE DISSECTION;  Surgeon: Curvin Deward MOULD, MD;  Location: [facility];  Service: General;  Laterality: Left;   BREAST BIOPSY  2005   left   BREAST BIOPSY Left 03/30/2023   US  LT BREAST BX W LOC DEV EA ADD LESION IMG BX SPEC US  GUIDE 03/30/2023 [facility]   BREAST BIOPSY Left 03/30/2023   MM LT BREAST BX W LOC DEV 1ST LESION IMAGE BX SPEC STEREO GUIDE 03/30/2023 [facility]   BREAST BIOPSY Left 03/30/2023   US  LT BREAST BX W LOC DEV 1ST LESION IMG BX SPEC US  GUIDE 03/30/2023 [facility]   BREAST BIOPSY Left 05/05/2023   US  LT RADIOACTIVE SEED LOC 05/05/2023 [facility]   CATARACT EXTRACTION W/PHACO Right 05/10/2017   Procedure: CATARACT EXTRACTION PHACO AND INTRAOCULAR LENS PLACEMENT (IOC);  Surgeon: Roz Anes, MD;  Location: [facility];  Service: Ophthalmology;  Laterality: Right;  CDE: 5.04   CATARACT EXTRACTION W/PHACO Left 05/24/2017   Procedure: CATARACT EXTRACTION PHACO AND INTRAOCULAR LENS PLACEMENT (IOC);   Surgeon: Roz Anes, MD;  Location: [facility];  Service: Ophthalmology;  Laterality: Left;  CDE: 8.40   COLONOSCOPY  07/13/2011   Procedure: COLONOSCOPY;  Surgeon: Margo CHRISTELLA Haddock, MD;  Location: [facility];  Service: Endoscopy;  Laterality: N/A;  9:30 AM   COLONOSCOPY N/A 12/09/2018   diverticulosis, suspect diverticulum in mid sigmoid s/p epi, unable to place clip. 5 mm benign polyp. Colonic lipoma. Normal TI. Many diverticula occluded with stool.    NODE DISSECTION Left 05/06/2023   Procedure: TARGETED NODE DISSECTION;  Surgeon: Curvin Deward MOULD, MD;  Location: [facility] OR;  Service: General;  Laterality: Left;   POLYPECTOMY  12/09/2018   Procedure: POLYPECTOMY;  Surgeon: Shaaron Lamar CHRISTELLA, MD;  Location: [facility];  Service: Endoscopy;;  splenic flexure   PORT-A-CATH REMOVAL N/A 02/22/2024   Procedure: REMOVAL PORT-A-CATH;  Surgeon: Curvin Deward MOULD, MD;  Location: [facility];  Service: General;  Laterality: N/A;   PORTACATH PLACEMENT N/A 06/10/2023   Procedure: INSERTION PORT-A-CATH WITH ULTRASOUND GUIDANCE;  Surgeon: Curvin Deward MOULD, MD;  Location: [facility];  Service: General;  Laterality: N/A;   SIMPLE MASTECTOMY WITH AXILLARY SENTINEL NODE BIOPSY Left 05/06/2023   Procedure: LEFT MASTECTOMY AND SENTINEL NODE BIOPSY;  Surgeon: Curvin Deward MOULD, MD;  Location: [facility];  Service: General;  Laterality: Left;  Patient Active Problem List   Diagnosis Date Noted   Cancer of left female breast (HCC) 05/06/2023   Malignant neoplasm of upper-outer quadrant of left breast in female, estrogen receptor positive (HCC) 04/07/2023   Loss of weight 12/09/2021   Loose stools 10/03/2020   Fatty liver 07/18/2019   GIB (gastrointestinal bleeding) 12/10/2018   Lower GI bleed 12/08/2018   Nontoxic multinodular goiter 05/22/2015   Diabetes mellitus without complication (HCC) 05/22/2015   Essential hypertension, benign 05/22/2015   Palpable thyroid  08/29/2014   MGUS (monoclonal gammopathy  of unknown significance) 08/28/2014    PCP: Norleen General, MD  REFERRING PROVIDER: Crawford Morna Pickle, NP   REFERRING DIAG:  [phone] (ICD-10-CM) - Malignant neoplasm of upper-outer quadrant of left breast in female, estrogen receptor positive (HCC) I89.0 (ICD-10-CM) - Lymphedema  THERAPY DIAG:  No diagnosis found.  ONSET DATE: 01/01/24  Rationale for Evaluation and Treatment: Rehabilitation  SUBJECTIVE:                                                                                                                                                                                           SUBJECTIVE STATEMENT: The arm swells when I do a lot of work. Yesterday I did a lot of baking and cleaning.   PERTINENT HISTORY: Left breast cancer, ER positive ILC with positive node on biopsy. 05/06/23: Lt mastectomy 05/06/23 with 3/4 nodes positive. 06/10/23: ALND 0/1 nodes. Completed chemotherapy with Taxotere  and cytoxan , and radiation. Other hx includes: kidney disease, DM, liver disease.   PAIN:  Are you having pain? No  PRECAUTIONS: lymphedema risk Left side   RED FLAGS: None   WEIGHT BEARING RESTRICTIONS: No  FALLS:  Has patient fallen in last 6 months? No  LIVING ENVIRONMENT: Lives with: lives alone her daughter stays with her for a few days Lives in a house Stairs: No Medical equipment: None  OCCUPATION: Retired  LEISURE: Gardening  HAND DOMINANCE: right   PRIOR LEVEL OF FUNCTION: Independent  PATIENT GOALS: learn how to manage lymphedema on her own and learn what to do and what not to do   OBJECTIVE: Note: Objective measures were completed at Evaluation unless otherwise noted.  COGNITION: Overall cognitive status: Within functional limits for tasks assessed   PALPATION:   OBSERVATIONS / OTHER ASSESSMENTS:   POSTURE: rounded shoulders    UPPER EXTREMITY AROM/PROM:  A/PROM RIGHT   eval   Shoulder extension 75  Shoulder flexion 150  Shoulder  abduction 156  Shoulder internal rotation 36  Shoulder external rotation 86    (Blank rows = not tested)  A/PROM LEFT   eval  Shoulder extension 55  Shoulder flexion 115  Shoulder abduction 112  Shoulder internal rotation 42  Shoulder external rotation 89    (Blank rows = not tested)  CERVICAL AROM: All within normal limits:   LYMPHEDEMA ASSESSMENTS:   LANDMARK RIGHT  eval  At axilla  31.5  15 cm proximal to olecranon process 28.2  10 cm proximal to olecranon process 27.5  Olecranon process 26  15 cm proximal to ulnar styloid process 24.1  10 cm proximal to ulnar styloid process 20  Just proximal to ulnar styloid process 17  Across hand at thumb web space 20  At base of 2nd digit 6.4  (Blank rows = not tested)  LANDMARK LEFT  eval  At axilla  31  15 cm proximal to olecranon process 29  10 cm proximal to olecranon process 29  Olecranon process 26.7  15 cm proximal to ulnar styloid process 24  10 cm proximal to ulnar styloid process 19.4  Just proximal to ulnar styloid process 17.1  Across hand at thumb web space 19.8  At base of 2nd digit 6.5  (Blank rows = not tested)   L-DEX LYMPHEDEMA SCREENING: The patient was assessed using the L-Dex machine today to produce a lymphedema index baseline score.    L-DEX FLOWSHEETS - 03/09/24 1000       L-DEX LYMPHEDEMA SCREENING   Measurement Type Unilateral    L-DEX MEASUREMENT EXTREMITY Upper Extremity    POSITION  Standing    DOMINANT SIDE Right    At Risk Side Left    BASELINE SCORE (UNILATERAL) -6.5    L-DEX SCORE (UNILATERAL) 0.9    VALUE CHANGE (UNILAT) 7.4         The patient was assessed using the L-Dex machine today to produce a lymphedema index baseline score. The patient will be reassessed on a regular basis (typically every 3 months) to obtain new L-Dex scores. If the score is > 6.5 points away from his/her baseline score indicating onset of subclinical lymphedema, it will be recommended to wear a  compression garment for 4 weeks, 12 hours per day and then be reassessed. If the score continues to be > 6.5 points from baseline at reassessment, we will initiate lymphedema treatment. Assessing in this manner has a 95% rate of preventing clinically significant lymphedema.  L-DEX LYMPHEDEMA SCREENING Measurement Type: Unilateral L-DEX MEASUREMENT EXTREMITY: Upper Extremity POSITION : Standing DOMINANT SIDE: Right At Risk Side: Left BASELINE SCORE (UNILATERAL): -6.5 L-DEX SCORE (UNILATERAL): 0.9 VALUE CHANGE (UNILAT): 7.4  Flowsheet Row Outpatient Rehab from 03/09/2024 in [facility]  Lymphedema Life Impact Scale Total Score 14.71 %    TODAY'S TREATMENT:                                                                                                                                         03/09/24: Measured pt for a compression sleeve today due to  elevated L dex score. Issued pt a [employer] size 3 (sand) and educated her to wear 12 hours during waking hours. Educated pt how to don/doff and had her return demonstrate.   PATIENT EDUCATION:  Education details: lymphedema education, compression sleeve wear and donning/doffing, post op exercises Person educated: Patient Education method: Explanation, Demonstration, Verbal cues, and Handouts Education comprehension: verbalized understanding and needs further education  HOME EXERCISE PROGRAM: Post op exercises Wear compression sleeve 12 hours/day during waking hours  ASSESSMENT:  CLINICAL IMPRESSION: Patient is a 78 y.o. female who was seen today for physical therapy evaluation and treatment for L UE lymphedema following her Lt mastectomy.  She had positive nodes and completed chemo and radiation. She reports the swelling comes and goes and tends to increase with activity. Her ldex score was above 6.5 today indicating subclinical lymphedema. She was issued a compression sleeve to wear during day time hours. She  would benefit from additional skilled PT services to improve her L shoulder ROM which has been limited post op, decrease swelling and educate pt in how to independently manage lymphedema.   OBJECTIVE IMPAIRMENTS: decreased activity tolerance, decreased knowledge of condition, decreased knowledge of use of DME, decreased ROM, increased edema, increased fascial restrictions, impaired UE functional use, and postural dysfunction.   ACTIVITY LIMITATIONS: carrying, lifting, and reach over head  PARTICIPATION LIMITATIONS: none  PERSONAL FACTORS: Age and 1 comorbidity: diabetes, radiation to axilla are also affecting patient's functional outcome.   REHAB POTENTIAL: Excellent  CLINICAL DECISION MAKING: Stable/uncomplicated  EVALUATION COMPLEXITY: Low  GOALS: Goals reviewed with patient? Yes  SHORT TERM GOALS=LTGs: Target date: 04/06/24  Pt will improve Lt shoulder flexion and abduction to equal to the opposite side to demonstrate improved reach  Baseline: Goal status: INITIAL  2.  Pt will be ind with a home exercise program for continued stretching and strengthening. Baseline:  Goal status: INITIAL  3.  Pt will be independent in self MLD for long term management of lymphedema.  Baseline:  Goal status: INITIAL  4. Pt will be educated on lymphedema surveillance, presentations and risk reduction  Goal Status: INITIAL  5. Pt will return to the green on ldex screening to demonstrate reversal of subclinical lymphedema.  Goal status: INITIAL  PLAN:  PT FREQUENCY: 2x/week  PT DURATION: 4 weeks  PLANNED INTERVENTIONS: 97164- PT Re-evaluation, 97110-Therapeutic exercises, 97530- Therapeutic activity, 97112- Neuromuscular re-education, 97535- Self Care, 02859- Manual therapy, [phone]- Orthotic Initial, [phone]- Orthotic/Prosthetic subsequent, Patient/Family education, Balance training, Therapeutic exercises, and Self Care  PLAN FOR NEXT SESSION: Lt shoulder AAROM, AROM, PROM, instruct in self  MLD for L UE, re assess sozo in 4 weeks     [employer], PT 03/09/2024, 11:03 AM

## 2024-03-09 ENCOUNTER — Other Ambulatory Visit: Payer: Self-pay

## 2024-03-09 ENCOUNTER — Encounter: Payer: Self-pay | Admitting: Physical Therapy

## 2024-03-09 ENCOUNTER — Ambulatory Visit: Attending: Adult Health | Admitting: Physical Therapy

## 2024-03-09 DIAGNOSIS — Z170 Estrogen receptor positive status [ER+]: Secondary | ICD-10-CM | POA: Diagnosis not present

## 2024-03-09 DIAGNOSIS — Z923 Personal history of irradiation: Secondary | ICD-10-CM | POA: Diagnosis not present

## 2024-03-09 DIAGNOSIS — Z9221 Personal history of antineoplastic chemotherapy: Secondary | ICD-10-CM | POA: Insufficient documentation

## 2024-03-09 DIAGNOSIS — I890 Lymphedema, not elsewhere classified: Secondary | ICD-10-CM | POA: Insufficient documentation

## 2024-03-09 DIAGNOSIS — I972 Postmastectomy lymphedema syndrome: Secondary | ICD-10-CM

## 2024-03-09 DIAGNOSIS — C50412 Malignant neoplasm of upper-outer quadrant of left female breast: Secondary | ICD-10-CM | POA: Insufficient documentation

## 2024-03-09 DIAGNOSIS — M25612 Stiffness of left shoulder, not elsewhere classified: Secondary | ICD-10-CM

## 2024-03-13 ENCOUNTER — Other Ambulatory Visit

## 2024-03-15 ENCOUNTER — Encounter: Payer: Self-pay | Admitting: Physical Therapy

## 2024-03-15 ENCOUNTER — Ambulatory Visit: Admitting: Physical Therapy

## 2024-03-15 DIAGNOSIS — C50412 Malignant neoplasm of upper-outer quadrant of left female breast: Secondary | ICD-10-CM | POA: Diagnosis not present

## 2024-03-15 DIAGNOSIS — I890 Lymphedema, not elsewhere classified: Secondary | ICD-10-CM | POA: Diagnosis not present

## 2024-03-15 DIAGNOSIS — Z923 Personal history of irradiation: Secondary | ICD-10-CM | POA: Diagnosis not present

## 2024-03-15 DIAGNOSIS — Z170 Estrogen receptor positive status [ER+]: Secondary | ICD-10-CM

## 2024-03-15 DIAGNOSIS — I972 Postmastectomy lymphedema syndrome: Secondary | ICD-10-CM

## 2024-03-15 DIAGNOSIS — Z9221 Personal history of antineoplastic chemotherapy: Secondary | ICD-10-CM | POA: Diagnosis not present

## 2024-03-15 DIAGNOSIS — M25612 Stiffness of left shoulder, not elsewhere classified: Secondary | ICD-10-CM

## 2024-03-15 NOTE — Therapy (Signed)
 OUTPATIENT PHYSICAL THERAPY  UPPER EXTREMITY ONCOLOGY EVALUATION  Patient Name: Melanie Welch MRN: [id number] DOB:10/04/1945, 78 y.o., female Today's Date: 03/15/2024  END OF SESSION:  PT End of Session - 03/15/24 1156     Visit Number 2    Number of Visits 9    Date for PT Re-Evaluation 04/06/24    Authorization Type auth needed    PT Start Time 1103    PT Stop Time 1200    PT Time Calculation (min) 57 min    Activity Tolerance Patient tolerated treatment well    Behavior During Therapy [facility] for tasks assessed/performed            Past Medical History:  Diagnosis Date   Breast cancer (HCC) 03/30/2023   Colonic adenoma 12/2018   ONE   Diabetes mellitus    Fatty liver    Hypertension    MGUS (monoclonal gammopathy of unknown significance)    Port-A-Cath in place 07/05/2023   Proteinuria    Past Surgical History:  Procedure Laterality Date   ABDOMINAL HYSTERECTOMY     AXILLARY LYMPH NODE DISSECTION Left 06/10/2023   Procedure: COMPLETION LEFT AXILLARY LYMPH NODE DISSECTION;  Surgeon: Curvin Deward MOULD, MD;  Location: [facility];  Service: General;  Laterality: Left;   BREAST BIOPSY  2005   left   BREAST BIOPSY Left 03/30/2023   US  LT BREAST BX W LOC DEV EA ADD LESION IMG BX SPEC US  GUIDE 03/30/2023 [facility]   BREAST BIOPSY Left 03/30/2023   MM LT BREAST BX W LOC DEV 1ST LESION IMAGE BX SPEC STEREO GUIDE 03/30/2023 [facility]   BREAST BIOPSY Left 03/30/2023   US  LT BREAST BX W LOC DEV 1ST LESION IMG BX SPEC US  GUIDE 03/30/2023 [facility]   BREAST BIOPSY Left 05/05/2023   US  LT RADIOACTIVE SEED LOC 05/05/2023 [facility]   CATARACT EXTRACTION W/PHACO Right 05/10/2017   Procedure: CATARACT EXTRACTION PHACO AND INTRAOCULAR LENS PLACEMENT (IOC);  Surgeon: Roz Anes, MD;  Location: [facility];  Service: Ophthalmology;  Laterality: Right;  CDE: 5.04   CATARACT EXTRACTION W/PHACO Left 05/24/2017   Procedure: CATARACT EXTRACTION PHACO AND  INTRAOCULAR LENS PLACEMENT (IOC);  Surgeon: Roz Anes, MD;  Location: [facility];  Service: Ophthalmology;  Laterality: Left;  CDE: 8.40   COLONOSCOPY  07/13/2011   Procedure: COLONOSCOPY;  Surgeon: Margo CHRISTELLA Haddock, MD;  [facility];  Service: Endoscopy;  Laterality: N/A;  9:30 AM   COLONOSCOPY N/A 12/09/2018   diverticulosis, suspect diverticulum in mid sigmoid s/p epi, unable to place clip. 5 mm benign polyp. Colonic lipoma. Normal TI. Many diverticula occluded with stool.    NODE DISSECTION Left 05/06/2023   Procedure: TARGETED NODE DISSECTION;  Surgeon: Curvin Deward MOULD, MD;  Location: [facility];  Service: General;  Laterality: Left;   POLYPECTOMY  12/09/2018   Procedure: POLYPECTOMY;  Surgeon: Shaaron Lamar CHRISTELLA, MD;  [facility];  Service: Endoscopy;;  splenic flexure   PORT-A-CATH REMOVAL N/A 02/22/2024   Procedure: REMOVAL PORT-A-CATH;  Surgeon: Curvin Deward MOULD, MD;  Location: [facility];  Service: General;  Laterality: N/A;   PORTACATH PLACEMENT N/A 06/10/2023   Procedure: INSERTION PORT-A-CATH WITH ULTRASOUND GUIDANCE;  Surgeon: Curvin Deward MOULD, MD;  Location: [facility];  Service: General;  Laterality: N/A;   SIMPLE MASTECTOMY WITH AXILLARY SENTINEL NODE BIOPSY Left 05/06/2023   Procedure: LEFT MASTECTOMY AND SENTINEL NODE BIOPSY;  Surgeon: Curvin Deward MOULD, MD;  Location: [facility]  OR;  Service: General;  Laterality: Left;   Patient Active Problem List   Diagnosis Date Noted   Cancer of left female breast (HCC) 05/06/2023   Malignant neoplasm of upper-outer quadrant of left breast in female, estrogen receptor positive (HCC) 04/07/2023   Loss of weight 12/09/2021   Loose stools 10/03/2020   Fatty liver 07/18/2019   GIB (gastrointestinal bleeding) 12/10/2018   Lower GI bleed 12/08/2018   Nontoxic multinodular goiter 05/22/2015   Diabetes mellitus without complication (HCC) 05/22/2015   Essential hypertension, benign 05/22/2015   Palpable thyroid   08/29/2014   MGUS (monoclonal gammopathy of unknown significance) 08/28/2014    PCP: Norleen General, MD  REFERRING PROVIDER: Crawford Morna Pickle, NP   REFERRING DIAG:  [id number] (ICD-10-CM) - Malignant neoplasm of upper-outer quadrant of left breast in female, estrogen receptor positive (HCC) I89.0 (ICD-10-CM) - Lymphedema  THERAPY DIAG:  1. Postmastectomy lymphedema   2. Stiffness of left shoulder, not elsewhere classified   3. Malignant neoplasm of upper-outer quadrant of left breast in female, estrogen receptor positive (HCC)    ONSET DATE: 01/01/24  Rationale for Evaluation and Treatment: Rehabilitation  SUBJECTIVE:                                                                                                                                                                                           SUBJECTIVE STATEMENT: The sleeve is helping. The arm has not been swelling.   PERTINENT HISTORY: Left breast cancer, ER positive ILC with positive node on biopsy. 05/06/23: Lt mastectomy 05/06/23 with 3/4 nodes positive. 06/10/23: ALND 0/1 nodes. Completed chemotherapy with Taxotere  and cytoxan , and radiation. Other hx includes: kidney disease, DM, liver disease.   PAIN:  Are you having pain? Yes 4/10, tightness,  L axilla, stretching improves pain, nothing improves it  PRECAUTIONS: lymphedema risk Left side   RED FLAGS: None   WEIGHT BEARING RESTRICTIONS: No  FALLS:  Has patient fallen in last 6 months? No  LIVING ENVIRONMENT: Lives with: lives alone her daughter stays with her for a few days Lives in a house Stairs: No Medical equipment: None  OCCUPATION: Retired  LEISURE: Gardening  HAND DOMINANCE: right   PRIOR LEVEL OF FUNCTION: Independent  PATIENT GOALS: learn how to manage lymphedema on her own and learn what to do and what not to do   OBJECTIVE: Note: Objective measures were completed at Evaluation unless otherwise noted.  COGNITION: Overall  cognitive status: Within functional limits for tasks assessed   PALPATION:   OBSERVATIONS / OTHER ASSESSMENTS:   POSTURE: rounded shoulders    UPPER EXTREMITY AROM/PROM:  A/PROM RIGHT   eval  Shoulder extension 75  Shoulder flexion 150  Shoulder abduction 156  Shoulder internal rotation 36  Shoulder external rotation 86    (Blank rows = not tested)  A/PROM LEFT   eval  Shoulder extension 55  Shoulder flexion 115  Shoulder abduction 112  Shoulder internal rotation 42  Shoulder external rotation 89    (Blank rows = not tested)  CERVICAL AROM: All within normal limits:   LYMPHEDEMA ASSESSMENTS:   LANDMARK RIGHT  eval  At axilla  31.5  15 cm proximal to olecranon process 28.2  10 cm proximal to olecranon process 27.5  Olecranon process 26  15 cm proximal to ulnar styloid process 24.1  10 cm proximal to ulnar styloid process 20  Just proximal to ulnar styloid process 17  Across hand at thumb web space 20  At base of 2nd digit 6.4  (Blank rows = not tested)  LANDMARK LEFT  eval  At axilla  31  15 cm proximal to olecranon process 29  10 cm proximal to olecranon process 29  Olecranon process 26.7  15 cm proximal to ulnar styloid process 24  10 cm proximal to ulnar styloid process 19.4  Just proximal to ulnar styloid process 17.1  Across hand at thumb web space 19.8  At base of 2nd digit 6.5  (Blank rows = not tested)   L-DEX LYMPHEDEMA SCREENING: The patient was assessed using the L-Dex machine today to produce a lymphedema index baseline score.     The patient was assessed using the L-Dex machine today to produce a lymphedema index baseline score. The patient will be reassessed on a regular basis (typically every 3 months) to obtain new L-Dex scores. If the score is > 6.5 points away from his/her baseline score indicating onset of subclinical lymphedema, it will be recommended to wear a compression garment for 4 weeks, 12 hours per day and then be  reassessed. If the score continues to be > 6.5 points from baseline at reassessment, we will initiate lymphedema treatment. Assessing in this manner has a 95% rate of preventing clinically significant lymphedema.     Flowsheet Row Outpatient Rehab from 03/09/2024 in [facility]  Lymphedema Life Impact Scale Total Score 14.71 %    TODAY'S TREATMENT:                                                                                                                                         03/15/24: Pulleys x 2 min in direction of flexion and abduction with pt returning therapist demo Yellow ball up wall x 10 reps in to flexion and 10 reps in to abduction on L with pt returning therapist demo Finger ladder x 3 reps in direction of flexion and 3 reps in L shoulder abduction with v/c and t/c to avoid shoulder hiking Manual therapy:  In supine: Attempted PROM on L shoulder but pt  had too much pain so R sidelying: scapular mobs to L scapula in all directions with increased tightness noted  Back to supine: PROM to L shoulder in to flexion, scaption, and ER/IR with v/c for relaxation with some discomfort that improved   03/09/24: Measured pt for a compression sleeve today due to elevated L dex score. Issued pt a [employer] size 3 (sand) and educated her to wear 12 hours during waking hours. Educated pt how to don/doff and had her return demonstrate.   PATIENT EDUCATION:  Education details: lymphedema education, compression sleeve wear and donning/doffing, post op exercises Person educated: Patient Education method: Explanation, Demonstration, Verbal cues, and Handouts Education comprehension: verbalized understanding and needs further education  HOME EXERCISE PROGRAM: Post op exercises Wear compression sleeve 12 hours/day during waking hours  ASSESSMENT:  CLINICAL IMPRESSION: Pt has been wearing her sleeve and reports her swelling has gone away. Began AAROM and PROM today  to improve L shoulder ROM. She had decreased scapular mobility that improved with manual therapy today and by end of session her shoulder was feeling better with less discomfort with AROM.   OBJECTIVE IMPAIRMENTS: decreased activity tolerance, decreased knowledge of condition, decreased knowledge of use of DME, decreased ROM, increased edema, increased fascial restrictions, impaired UE functional use, and postural dysfunction.   ACTIVITY LIMITATIONS: carrying, lifting, and reach over head  PARTICIPATION LIMITATIONS: none  PERSONAL FACTORS: Age and 1 comorbidity: diabetes, radiation to axilla are also affecting patient's functional outcome.   REHAB POTENTIAL: Excellent  CLINICAL DECISION MAKING: Stable/uncomplicated  EVALUATION COMPLEXITY: Low  GOALS: Goals reviewed with patient? Yes  SHORT TERM GOALS=LTGs: Target date: 04/06/24  Pt will improve Lt shoulder flexion and abduction to equal to the opposite side to demonstrate improved reach  Baseline: Goal status: INITIAL  2.  Pt will be ind with a home exercise program for continued stretching and strengthening. Baseline:  Goal status: INITIAL  3.  Pt will be independent in self MLD for long term management of lymphedema.  Baseline:  Goal status: INITIAL  4. Pt will be educated on lymphedema surveillance, presentations and risk reduction  Goal Status: INITIAL  5. Pt will return to the green on ldex screening to demonstrate reversal of subclinical lymphedema.  Goal status: INITIAL  PLAN:  PT FREQUENCY: 2x/week  PT DURATION: 4 weeks  PLANNED INTERVENTIONS: 97164- PT Re-evaluation, 97110-Therapeutic exercises, 97530- Therapeutic activity, 97112- Neuromuscular re-education, 97535- Self Care, 02859- Manual therapy, [phone]- Orthotic Initial, [phone]- Orthotic/Prosthetic subsequent, Patient/Family education, Balance training, Therapeutic exercises, and Self Care  PLAN FOR NEXT SESSION: Lt shoulder AAROM, AROM, PROM, instruct in self  MLD for L UE, re assess sozo in 4 weeks     [employer], PT 03/15/2024, 12:13 PM

## 2024-03-16 ENCOUNTER — Encounter: Payer: Self-pay | Admitting: Genetic Counselor

## 2024-03-16 ENCOUNTER — Telehealth: Payer: Self-pay | Admitting: Genetic Counselor

## 2024-03-16 DIAGNOSIS — Z1379 Encounter for other screening for genetic and chromosomal anomalies: Secondary | ICD-10-CM | POA: Insufficient documentation

## 2024-03-16 NOTE — Telephone Encounter (Signed)
 I contacted Ms. Stelle to discuss her genetic testing results. No pathogenic variants were identified in the 77 genes analyzed. 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 Detailed clinic note to follow.  The test report has been scanned into EPIC and is located under the Molecular Pathology section of the Results Review tab.  A portion of the result report is included below for reference.   Lauralei Clouse, MS, [facility] Genetic Counselor [email or web address] (P) [phone]

## 2024-03-19 ENCOUNTER — Ambulatory Visit

## 2024-03-19 DIAGNOSIS — Z923 Personal history of irradiation: Secondary | ICD-10-CM | POA: Diagnosis not present

## 2024-03-19 DIAGNOSIS — M25612 Stiffness of left shoulder, not elsewhere classified: Secondary | ICD-10-CM

## 2024-03-19 DIAGNOSIS — I890 Lymphedema, not elsewhere classified: Secondary | ICD-10-CM | POA: Diagnosis not present

## 2024-03-19 DIAGNOSIS — Z9189 Other specified personal risk factors, not elsewhere classified: Secondary | ICD-10-CM

## 2024-03-19 DIAGNOSIS — C50412 Malignant neoplasm of upper-outer quadrant of left female breast: Secondary | ICD-10-CM

## 2024-03-19 DIAGNOSIS — Z170 Estrogen receptor positive status [ER+]: Secondary | ICD-10-CM | POA: Diagnosis not present

## 2024-03-19 DIAGNOSIS — I972 Postmastectomy lymphedema syndrome: Secondary | ICD-10-CM

## 2024-03-19 DIAGNOSIS — Z9221 Personal history of antineoplastic chemotherapy: Secondary | ICD-10-CM | POA: Diagnosis not present

## 2024-03-19 NOTE — Therapy (Signed)
 OUTPATIENT PHYSICAL THERAPY  UPPER EXTREMITY ONCOLOGY TREATMENT  Patient Name: Melanie Welch MRN: [id number] DOB:September 06, 1945, 78 y.o., female Today's Date: 03/19/2024  END OF SESSION:  PT End of Session - 03/19/24 1205     Visit Number 3    Number of Visits 9    Date for PT Re-Evaluation 04/06/24    Authorization Type auth needed    PT Start Time 1204    PT Stop Time 1305    PT Time Calculation (min) 61 min    Activity Tolerance Patient tolerated treatment well    Behavior During Therapy [facility] for tasks assessed/performed            Past Medical History:  Diagnosis Date   Breast cancer (HCC) 03/30/2023   Colonic adenoma 12/2018   ONE   Diabetes mellitus    Fatty liver    Hypertension    MGUS (monoclonal gammopathy of unknown significance)    Port-A-Cath in place 07/05/2023   Proteinuria    Past Surgical History:  Procedure Laterality Date   ABDOMINAL HYSTERECTOMY     AXILLARY LYMPH NODE DISSECTION Left 06/10/2023   Procedure: COMPLETION LEFT AXILLARY LYMPH NODE DISSECTION;  Surgeon: Curvin Deward MOULD, MD;  Location: [facility];  Service: General;  Laterality: Left;   BREAST BIOPSY  2005   left   BREAST BIOPSY Left 03/30/2023   US  LT BREAST BX W LOC DEV EA ADD LESION IMG BX SPEC US  GUIDE 03/30/2023 [facility]   BREAST BIOPSY Left 03/30/2023   MM LT BREAST BX W LOC DEV 1ST LESION IMAGE BX SPEC STEREO GUIDE 03/30/2023 [facility]   BREAST BIOPSY Left 03/30/2023   US  LT BREAST BX W LOC DEV 1ST LESION IMG BX SPEC US  GUIDE 03/30/2023 [facility]   BREAST BIOPSY Left 05/05/2023   US  LT RADIOACTIVE SEED LOC 05/05/2023 [facility]   CATARACT EXTRACTION W/PHACO Right 05/10/2017   Procedure: CATARACT EXTRACTION PHACO AND INTRAOCULAR LENS PLACEMENT (IOC);  Surgeon: Roz Anes, MD;  Location: [facility];  Service: Ophthalmology;  Laterality: Right;  CDE: 5.04   CATARACT EXTRACTION W/PHACO Left 05/24/2017   Procedure: CATARACT EXTRACTION PHACO AND  INTRAOCULAR LENS PLACEMENT (IOC);  Surgeon: Roz Anes, MD;  Location: [facility];  Service: Ophthalmology;  Laterality: Left;  CDE: 8.40   COLONOSCOPY  07/13/2011   Procedure: COLONOSCOPY;  Surgeon: Margo CHRISTELLA Haddock, MD;  Location: [facility];  Service: Endoscopy;  Laterality: N/A;  9:30 AM   COLONOSCOPY N/A 12/09/2018   diverticulosis, suspect diverticulum in mid sigmoid s/p epi, unable to place clip. 5 mm benign polyp. Colonic lipoma. Normal TI. Many diverticula occluded with stool.    NODE DISSECTION Left 05/06/2023   Procedure: TARGETED NODE DISSECTION;  Surgeon: Curvin Deward MOULD, MD;  Location: [facility];  Service: General;  Laterality: Left;   POLYPECTOMY  12/09/2018   Procedure: POLYPECTOMY;  Surgeon: Shaaron Lamar CHRISTELLA, MD;  Location: [facility];  Service: Endoscopy;;  splenic flexure   PORT-A-CATH REMOVAL N/A 02/22/2024   Procedure: REMOVAL PORT-A-CATH;  Surgeon: Curvin Deward MOULD, MD;  Location: [facility];  Service: General;  Laterality: N/A;   PORTACATH PLACEMENT N/A 06/10/2023   Procedure: INSERTION PORT-A-CATH WITH ULTRASOUND GUIDANCE;  Surgeon: Curvin Deward MOULD, MD;  Location: [facility];  Service: General;  Laterality: N/A;   SIMPLE MASTECTOMY WITH AXILLARY SENTINEL NODE BIOPSY Left 05/06/2023   Procedure: LEFT MASTECTOMY AND SENTINEL NODE BIOPSY;  Surgeon: Curvin Deward MOULD, MD;  Location: [facility];  Service: General;  Laterality: Left;   Patient Active Problem List   Diagnosis Date Noted   Genetic testing 03/16/2024   Cancer of left female breast (HCC) 05/06/2023   Malignant neoplasm of upper-outer quadrant of left breast in female, estrogen receptor positive (HCC) 04/07/2023   Loss of weight 12/09/2021   Loose stools 10/03/2020   Fatty liver 07/18/2019   GIB (gastrointestinal bleeding) 12/10/2018   Lower GI bleed 12/08/2018   Nontoxic multinodular goiter 05/22/2015   Diabetes mellitus without complication (HCC) 05/22/2015   Essential hypertension, benign  05/22/2015   Palpable thyroid  08/29/2014   MGUS (monoclonal gammopathy of unknown significance) 08/28/2014    PCP: Norleen General, MD  REFERRING PROVIDER: Crawford Morna Pickle, NP   REFERRING DIAG:  [phone] (ICD-10-CM) - Malignant neoplasm of upper-outer quadrant of left breast in female, estrogen receptor positive (HCC) I89.0 (ICD-10-CM) - Lymphedema  THERAPY DIAG:  1. Postmastectomy lymphedema   2. Stiffness of left shoulder, not elsewhere classified   3. Malignant neoplasm of upper-outer quadrant of left breast in female, estrogen receptor positive (HCC)    ONSET DATE: 01/01/24  Rationale for Evaluation and Treatment: Rehabilitation  SUBJECTIVE:                                                                                                                                                                                           SUBJECTIVE STATEMENT: The sleeve is fine. I've been doing my stretches and they feel good. I like to do them on floor bc that gives me a better stretch. I just use a chair to get back up.   PERTINENT HISTORY: Left breast cancer, ER positive ILC with positive node on biopsy. 05/06/23: Lt mastectomy 05/06/23 with 3/4 nodes positive. 06/10/23: ALND 0/1 nodes. Completed chemotherapy with Taxotere  and cytoxan , and radiation. Other hx includes: kidney disease, DM, liver disease.   PAIN:  Are you having pain?No, been feeling better since stretching  PRECAUTIONS: lymphedema risk Left side   RED FLAGS: None   WEIGHT BEARING RESTRICTIONS: No  FALLS:  Has patient fallen in last 6 months? No  LIVING ENVIRONMENT: Lives with: lives alone her daughter stays with her for a few days Lives in a house Stairs: No Medical equipment: None  OCCUPATION: Retired  LEISURE: Gardening  HAND DOMINANCE: right   PRIOR LEVEL OF FUNCTION: Independent  PATIENT GOALS: learn how to manage lymphedema on her own and learn what to do and what not to  do   OBJECTIVE: Note: Objective measures were completed at Evaluation unless otherwise noted.  COGNITION: Overall cognitive status: Within functional limits for tasks assessed   PALPATION:  OBSERVATIONS / OTHER ASSESSMENTS:   POSTURE: rounded shoulders    UPPER EXTREMITY AROM/PROM:  A/PROM RIGHT   eval   Shoulder extension 75  Shoulder flexion 150  Shoulder abduction 156  Shoulder internal rotation 36  Shoulder external rotation 86    (Blank rows = not tested)  A/PROM LEFT   eval  Shoulder extension 55  Shoulder flexion 115  Shoulder abduction 112  Shoulder internal rotation 42  Shoulder external rotation 89    (Blank rows = not tested)  CERVICAL AROM: All within normal limits:   LYMPHEDEMA ASSESSMENTS:   LANDMARK RIGHT  eval  At axilla  31.5  15 cm proximal to olecranon process 28.2  10 cm proximal to olecranon process 27.5  Olecranon process 26  15 cm proximal to ulnar styloid process 24.1  10 cm proximal to ulnar styloid process 20  Just proximal to ulnar styloid process 17  Across hand at thumb web space 20  At base of 2nd digit 6.4  (Blank rows = not tested)  LANDMARK LEFT  eval  At axilla  31  15 cm proximal to olecranon process 29  10 cm proximal to olecranon process 29  Olecranon process 26.7  15 cm proximal to ulnar styloid process 24  10 cm proximal to ulnar styloid process 19.4  Just proximal to ulnar styloid process 17.1  Across hand at thumb web space 19.8  At base of 2nd digit 6.5  (Blank rows = not tested)   L-DEX LYMPHEDEMA SCREENING: The patient was assessed using the L-Dex machine today to produce a lymphedema index baseline score.     The patient was assessed using the L-Dex machine today to produce a lymphedema index baseline score. The patient will be reassessed on a regular basis (typically every 3 months) to obtain new L-Dex scores. If the score is > 6.5 points away from his/her baseline score indicating onset of  subclinical lymphedema, it will be recommended to wear a compression garment for 4 weeks, 12 hours per day and then be reassessed. If the score continues to be > 6.5 points from baseline at reassessment, we will initiate lymphedema treatment. Assessing in this manner has a 95% rate of preventing clinically significant lymphedema.     Flowsheet Row Outpatient Rehab from 03/09/2024 in [facility]  Lymphedema Life Impact Scale Total Score 14.71 %    TODAY'S TREATMENT:                                                                                                                                         03/19/24: Therapeutic Exercise Pulleys into flex and abd x 2 mins each returning therapist demo and VC's throughout with demo to decrease Lt scapular compensations Roll yellow ball up wall into flex x 10 and Lt abd x 5, tactile during abd for correct UE position Modified downward dog on wall x  5 reps, 5 sec holds returning therapist demo Doorway Pectoralis Stretch x 4 reps, 20 sec holds and tactile cues to decrease compensations due to tight Lt shoulder Therapeutic Activities Supine over half foam roll for following: Bil horz abd but slightly lower than horz due to post shoulder pain initially, this was gone with lowered UE position x 5 reps; bil UE scaption into a V x 7 reps, and then il UE snow angel x 5 reps, 5 sec holds returning demo for each. Manual Therapy P/ROM to Lt shoulder into flex, scaption and er with scapular depression by therapist throughout  STM in supine to Lt pect tendon and then in Rt S/L to medial scapula border and Lt UT Scap Mobs into protraction and retraction when in Rt S/L   03/15/24: Pulleys x 2 min in direction of flexion and abduction with pt returning therapist demo Yellow ball up wall x 10 reps in to flexion and 10 reps in to abduction on L with pt returning therapist demo Finger ladder x 3 reps in direction of flexion and 3 reps in L  shoulder abduction with v/c and t/c to avoid shoulder hiking Manual therapy:  In supine: Attempted PROM on L shoulder but pt had too much pain so R sidelying: scapular mobs to L scapula in all directions with increased tightness noted  Back to supine: PROM to L shoulder in to flexion, scaption, and ER/IR with v/c for relaxation with some discomfort that improved   03/09/24: Measured pt for a compression sleeve today due to elevated L dex score. Issued pt a [employer] size 3 (sand) and educated her to wear 12 hours during waking hours. Educated pt how to don/doff and had her return demonstrate.   PATIENT EDUCATION:  Education details: lymphedema education, compression sleeve wear and donning/doffing, post op exercises Person educated: Patient Education method: Explanation, Demonstration, Verbal cues, and Handouts Education comprehension: verbalized understanding and needs further education  HOME EXERCISE PROGRAM: Post op exercises Wear compression sleeve 12 hours/day during waking hours  ASSESSMENT:  CLINICAL IMPRESSION: Pt has had no issues with wear of sleeve daily. Also has been doing her HEP stretches and reports already noticing improvements with her shoulder A/ROM. Today progressed her to include A/ROM stretches over foam roll for improved posture and scap mobility as this it limited. Then continued with manual therapy working to decrease end motion limitation due to fascial restrictions.   OBJECTIVE IMPAIRMENTS: decreased activity tolerance, decreased knowledge of condition, decreased knowledge of use of DME, decreased ROM, increased edema, increased fascial restrictions, impaired UE functional use, and postural dysfunction.   ACTIVITY LIMITATIONS: carrying, lifting, and reach over head  PARTICIPATION LIMITATIONS: none  PERSONAL FACTORS: Age and 1 comorbidity: diabetes, radiation to axilla are also affecting patient's functional outcome.   REHAB POTENTIAL: Excellent  CLINICAL  DECISION MAKING: Stable/uncomplicated  EVALUATION COMPLEXITY: Low  GOALS: Goals reviewed with patient? Yes  SHORT TERM GOALS=LTGs: Target date: 04/06/24  Pt will improve Lt shoulder flexion and abduction to equal to the opposite side to demonstrate improved reach  Baseline: Goal status: INITIAL  2.  Pt will be ind with a home exercise program for continued stretching and strengthening. Baseline:  Goal status: INITIAL  3.  Pt will be independent in self MLD for long term management of lymphedema.  Baseline:  Goal status: INITIAL  4. Pt will be educated on lymphedema surveillance, presentations and risk reduction  Goal Status: INITIAL  5. Pt will return to the green on ldex  screening to demonstrate reversal of subclinical lymphedema.  Goal status: INITIAL  PLAN:  PT FREQUENCY: 2x/week  PT DURATION: 4 weeks  PLANNED INTERVENTIONS: [id number]- PT Re-evaluation, 97110-Therapeutic exercises, 97530- Therapeutic activity, 97112- Neuromuscular re-education, 97535- Self Care, [id number]- Manual therapy, [phone]- Orthotic Initial, [phone]- Orthotic/Prosthetic subsequent, Patient/Family education, Balance training, Therapeutic exercises, and Self Care  PLAN FOR NEXT SESSION: Cont Lt shoulder AAROM, AROM, PROM and scap mobility, STM to tight Lt pect insertion, instruct in self MLD for L UE, re assess sozo in 4 weeks     Aden Berwyn Caldron, PTA 03/19/2024, 1:21 PM

## 2024-03-21 ENCOUNTER — Ambulatory Visit: Admitting: Rehabilitation

## 2024-03-21 ENCOUNTER — Encounter: Payer: Self-pay | Admitting: Rehabilitation

## 2024-03-21 DIAGNOSIS — I890 Lymphedema, not elsewhere classified: Secondary | ICD-10-CM | POA: Diagnosis not present

## 2024-03-21 DIAGNOSIS — Z9221 Personal history of antineoplastic chemotherapy: Secondary | ICD-10-CM | POA: Diagnosis not present

## 2024-03-21 DIAGNOSIS — Z923 Personal history of irradiation: Secondary | ICD-10-CM | POA: Diagnosis not present

## 2024-03-21 DIAGNOSIS — M25612 Stiffness of left shoulder, not elsewhere classified: Secondary | ICD-10-CM

## 2024-03-21 DIAGNOSIS — Z170 Estrogen receptor positive status [ER+]: Secondary | ICD-10-CM

## 2024-03-21 DIAGNOSIS — I972 Postmastectomy lymphedema syndrome: Secondary | ICD-10-CM

## 2024-03-21 DIAGNOSIS — Z9189 Other specified personal risk factors, not elsewhere classified: Secondary | ICD-10-CM

## 2024-03-21 DIAGNOSIS — C50412 Malignant neoplasm of upper-outer quadrant of left female breast: Secondary | ICD-10-CM | POA: Diagnosis not present

## 2024-03-21 NOTE — Therapy (Signed)
 OUTPATIENT PHYSICAL THERAPY  UPPER EXTREMITY ONCOLOGY TREATMENT  Patient Name: Melanie Welch MRN: [id number] DOB:July 14, 1946, 78 y.o., female Today's Date: 03/21/2024  END OF SESSION:  PT End of Session - 03/21/24 1355     Visit Number 4    Number of Visits 9    Date for PT Re-Evaluation 04/06/24    Authorization Type 1 re-eval and 9 visits 03/09/24-04/06/24    Authorization - Visit Number 4    Authorization - Number of Visits 9    Progress Note Due on Visit 10    PT Start Time 1400    PT Stop Time 1453    PT Time Calculation (min) 53 min    Activity Tolerance Patient tolerated treatment well    Behavior During Therapy [facility] for tasks assessed/performed            Past Medical History:  Diagnosis Date   Breast cancer (HCC) 03/30/2023   Colonic adenoma 12/2018   ONE   Diabetes mellitus    Fatty liver    Hypertension    MGUS (monoclonal gammopathy of unknown significance)    Port-A-Cath in place 07/05/2023   Proteinuria    Past Surgical History:  Procedure Laterality Date   ABDOMINAL HYSTERECTOMY     AXILLARY LYMPH NODE DISSECTION Left 06/10/2023   Procedure: COMPLETION LEFT AXILLARY LYMPH NODE DISSECTION;  Surgeon: Curvin Deward MOULD, MD;  Location: [facility];  Service: General;  Laterality: Left;   BREAST BIOPSY  2005   left   BREAST BIOPSY Left 03/30/2023   US  LT BREAST BX W LOC DEV EA ADD LESION IMG BX SPEC US  GUIDE 03/30/2023 [facility]   BREAST BIOPSY Left 03/30/2023   MM LT BREAST BX W LOC DEV 1ST LESION IMAGE BX SPEC STEREO GUIDE 03/30/2023 [facility]   BREAST BIOPSY Left 03/30/2023   US  LT BREAST BX W LOC DEV 1ST LESION IMG BX SPEC US  GUIDE 03/30/2023 [facility]   BREAST BIOPSY Left 05/05/2023   US  LT RADIOACTIVE SEED LOC 05/05/2023 [facility]   CATARACT EXTRACTION W/PHACO Right 05/10/2017   Procedure: CATARACT EXTRACTION PHACO AND INTRAOCULAR LENS PLACEMENT (IOC);  Surgeon: Roz Anes, MD;  Location: [facility];  Service:  Ophthalmology;  Laterality: Right;  CDE: 5.04   CATARACT EXTRACTION W/PHACO Left 05/24/2017   Procedure: CATARACT EXTRACTION PHACO AND INTRAOCULAR LENS PLACEMENT (IOC);  Surgeon: Roz Anes, MD;  Location: [facility];  Service: Ophthalmology;  Laterality: Left;  CDE: 8.40   COLONOSCOPY  07/13/2011   Procedure: COLONOSCOPY;  Surgeon: Margo CHRISTELLA Haddock, MD;  [facility];  Service: Endoscopy;  Laterality: N/A;  9:30 AM   COLONOSCOPY N/A 12/09/2018   diverticulosis, suspect diverticulum in mid sigmoid s/p epi, unable to place clip. 5 mm benign polyp. Colonic lipoma. Normal TI. Many diverticula occluded with stool.    NODE DISSECTION Left 05/06/2023   Procedure: TARGETED NODE DISSECTION;  Surgeon: Curvin Deward MOULD, MD;  Location: [facility] OR;  Service: General;  Laterality: Left;   POLYPECTOMY  12/09/2018   Procedure: POLYPECTOMY;  Surgeon: Shaaron Lamar CHRISTELLA, MD;  [facility];  Service: Endoscopy;;  splenic flexure   PORT-A-CATH REMOVAL N/A 02/22/2024   Procedure: REMOVAL PORT-A-CATH;  Surgeon: Curvin Deward MOULD, MD;  Location: [facility];  Service: General;  Laterality: N/A;   PORTACATH PLACEMENT N/A 06/10/2023   Procedure: INSERTION PORT-A-CATH WITH ULTRASOUND GUIDANCE;  Surgeon: Curvin Deward MOULD, MD;  Location: [facility];  Service: General;  Laterality:  N/A;   SIMPLE MASTECTOMY WITH AXILLARY SENTINEL NODE BIOPSY Left 05/06/2023   Procedure: LEFT MASTECTOMY AND SENTINEL NODE BIOPSY;  Surgeon: Curvin Deward MOULD, MD;  Location: [facility] OR;  Service: General;  Laterality: Left;   Patient Active Problem List   Diagnosis Date Noted   Genetic testing 03/16/2024   Cancer of left female breast (HCC) 05/06/2023   Malignant neoplasm of upper-outer quadrant of left breast in female, estrogen receptor positive (HCC) 04/07/2023   Loss of weight 12/09/2021   Loose stools 10/03/2020   Fatty liver 07/18/2019   GIB (gastrointestinal bleeding) 12/10/2018   Lower GI bleed 12/08/2018    Nontoxic multinodular goiter 05/22/2015   Diabetes mellitus without complication (HCC) 05/22/2015   Essential hypertension, benign 05/22/2015   Palpable thyroid  08/29/2014   MGUS (monoclonal gammopathy of unknown significance) 08/28/2014    PCP: Norleen General, MD  REFERRING PROVIDER: Crawford Morna Pickle, NP   REFERRING DIAG:  [phone] (ICD-10-CM) - Malignant neoplasm of upper-outer quadrant of left breast in female, estrogen receptor positive (HCC) I89.0 (ICD-10-CM) - Lymphedema  THERAPY DIAG:  1. Postmastectomy lymphedema   2. Stiffness of left shoulder, not elsewhere classified   3. Malignant neoplasm of upper-outer quadrant of left breast in female, estrogen receptor positive (HCC)    ONSET DATE: 01/01/24  Rationale for Evaluation and Treatment: Rehabilitation  SUBJECTIVE:                                                                                                                                                                                           SUBJECTIVE STATEMENT: I am doing well.  I got some pulleys for home.    PERTINENT HISTORY: Left breast cancer, ER positive ILC with positive node on biopsy. 05/06/23: Lt mastectomy 05/06/23 with 3/4 nodes positive. 06/10/23: ALND 0/1 nodes. Completed chemotherapy with Taxotere  and cytoxan , and radiation. Other hx includes: kidney disease, DM, liver disease.   PAIN:  Are you having pain?No, been feeling better since stretching  PRECAUTIONS: lymphedema risk Left side   RED FLAGS: None   WEIGHT BEARING RESTRICTIONS: No  FALLS:  Has patient fallen in last 6 months? No  LIVING ENVIRONMENT: Lives with: lives alone her daughter stays with her for a few days Lives in a house Stairs: No Medical equipment: None  OCCUPATION: Retired  LEISURE: Gardening  HAND DOMINANCE: right   PRIOR LEVEL OF FUNCTION: Independent  PATIENT GOALS: learn how to manage lymphedema on her own and learn what to do and what not to  do   OBJECTIVE: Note: Objective measures were completed at Evaluation unless otherwise noted.  COGNITION: Overall cognitive status: Within functional limits  for tasks assessed   PALPATION:   OBSERVATIONS / OTHER ASSESSMENTS:   POSTURE: rounded shoulders    UPPER EXTREMITY AROM/PROM:  A/PROM RIGHT   eval   Shoulder extension 75  Shoulder flexion 150  Shoulder abduction 156  Shoulder internal rotation 36  Shoulder external rotation 86    (Blank rows = not tested)  A/PROM LEFT   eval 03/21/24  Shoulder extension 55   Shoulder flexion 115 119  Shoulder abduction 112 112  Shoulder internal rotation 42   Shoulder external rotation 89     (Blank rows = not tested)  CERVICAL AROM: All within normal limits:   LYMPHEDEMA ASSESSMENTS:   LANDMARK RIGHT  eval  At axilla  31.5  15 cm proximal to olecranon process 28.2  10 cm proximal to olecranon process 27.5  Olecranon process 26  15 cm proximal to ulnar styloid process 24.1  10 cm proximal to ulnar styloid process 20  Just proximal to ulnar styloid process 17  Across hand at thumb web space 20  At base of 2nd digit 6.4  (Blank rows = not tested)  LANDMARK LEFT  eval  At axilla  31  15 cm proximal to olecranon process 29  10 cm proximal to olecranon process 29  Olecranon process 26.7  15 cm proximal to ulnar styloid process 24  10 cm proximal to ulnar styloid process 19.4  Just proximal to ulnar styloid process 17.1  Across hand at thumb web space 19.8  At base of 2nd digit 6.5  (Blank rows = not tested)   L-DEX LYMPHEDEMA SCREENING: The patient was assessed using the L-Dex machine today to produce a lymphedema index baseline score. The patient will be reassessed on a regular basis (typically every 3 months) to obtain new L-Dex scores. If the score is > 6.5 points away from his/her baseline score indicating onset of subclinical lymphedema, it will be recommended to wear a compression garment for 4 weeks, 12  hours per day and then be reassessed. If the score continues to be > 6.5 points from baseline at reassessment, we will initiate lymphedema treatment. Assessing in this manner has a 95% rate of preventing clinically significant lymphedema.     Flowsheet Row Outpatient Rehab from 03/09/2024 in [facility]  Lymphedema Life Impact Scale Total Score 14.71 %    TODAY'S TREATMENT:                                                                                                                                         03/21/24: Therapeutic Exercise Pulleys into flex and abd x 2 mins each returning therapist demo and VC's throughout with demo to decrease Lt scapular compensations Roll yellow ball up wall into flex x 10 and Lt abd x 5 Doorway Pectoralis Stretch x 4 reps, 20 sec holds and tactile cues to decrease compensations due to tight Lt  shoulder Therapeutic Activities Supine over half foam roll for following: Bil horz abd prolonged stretch x 60, bil UE scaption into a V x 10 reps, and then UE snow angel x 10 reps, pro/ret x 5 Manual Therapy P/ROM to Lt shoulder into flex, scaption and er with scapular depression by therapist throughout  STM in supine to Lt pect tendon and then in Rt S/L to medial scapula border and Lt UT \ 03/19/24: Therapeutic Exercise Pulleys into flex and abd x 2 mins each returning therapist demo and VC's throughout with demo to decrease Lt scapular compensations Roll yellow ball up wall into flex x 10 and Lt abd x 5, tactile during abd for correct UE position Modified downward dog on wall x 5 reps, 5 sec holds returning therapist demo Doorway Pectoralis Stretch x 4 reps, 20 sec holds and tactile cues to decrease compensations due to tight Lt shoulder Therapeutic Activities Supine over half foam roll for following: Bil horz abd but slightly lower than horz due to post shoulder pain initially, this was gone with lowered UE position x 5 reps; bil UE  scaption into a V x 7 reps, and then il UE snow angel x 5 reps, 5 sec holds returning demo for each. Manual Therapy P/ROM to Lt shoulder into flex, scaption and er with scapular depression by therapist throughout  STM in supine to Lt pect tendon and then in Rt S/L to medial scapula border and Lt UT Scap Mobs into protraction and retraction when in Rt S/L   03/15/24: Pulleys x 2 min in direction of flexion and abduction with pt returning therapist demo Yellow ball up wall x 10 reps in to flexion and 10 reps in to abduction on L with pt returning therapist demo Finger ladder x 3 reps in direction of flexion and 3 reps in L shoulder abduction with v/c and t/c to avoid shoulder hiking Manual therapy:  In supine: Attempted PROM on L shoulder but pt had too much pain so R sidelying: scapular mobs to L scapula in all directions with increased tightness noted  Back to supine: PROM to L shoulder in to flexion, scaption, and ER/IR with v/c for relaxation with some discomfort that improved   PATIENT EDUCATION:  Education details: lymphedema education, compression sleeve wear and donning/doffing, post op exercises Person educated: Patient Education method: Programmer, multimedia, Demonstration, Verbal cues, and Handouts Education comprehension: verbalized understanding and needs further education  HOME EXERCISE PROGRAM: Post op exercises Wear compression sleeve 12 hours/day during waking hours  ASSESSMENT:  CLINICAL IMPRESSION: No change in AROM at this point but pt is enjoying the exercises and the sleeve.  Will continue POC.  To include teaching self MLD.   OBJECTIVE IMPAIRMENTS: decreased activity tolerance, decreased knowledge of condition, decreased knowledge of use of DME, decreased ROM, increased edema, increased fascial restrictions, impaired UE functional use, and postural dysfunction.   ACTIVITY LIMITATIONS: carrying, lifting, and reach over head  PARTICIPATION LIMITATIONS: none  PERSONAL  FACTORS: Age and 1 comorbidity: diabetes, radiation to axilla are also affecting patient's functional outcome.   REHAB POTENTIAL: Excellent  CLINICAL DECISION MAKING: Stable/uncomplicated  EVALUATION COMPLEXITY: Low  GOALS: Goals reviewed with patient? Yes  SHORT TERM GOALS=LTGs: Target date: 04/06/24  Pt will improve Lt shoulder flexion and abduction to equal to the opposite side to demonstrate improved reach  Baseline: Goal status: INITIAL  2.  Pt will be ind with a home exercise program for continued stretching and strengthening. Baseline:  Goal status: INITIAL  3.  Pt will be independent in self MLD for long term management of lymphedema.  Baseline:  Goal status: INITIAL  4. Pt will be educated on lymphedema surveillance, presentations and risk reduction  Goal Status: INITIAL  5. Pt will return to the green on ldex screening to demonstrate reversal of subclinical lymphedema.  Goal status: INITIAL  PLAN:  PT FREQUENCY: 2x/week  PT DURATION: 4 weeks  PLANNED INTERVENTIONS: 97164- PT Re-evaluation, 97110-Therapeutic exercises, 97530- Therapeutic activity, 97112- Neuromuscular re-education, 97535- Self Care, [id number]- Manual therapy, [phone]- Orthotic Initial, [phone]- Orthotic/Prosthetic subsequent, Patient/Family education, Balance training, Therapeutic exercises, and Self Care  PLAN FOR NEXT SESSION: Cont Lt shoulder AAROM, AROM, PROM and scap mobility, STM to tight Lt pect insertion, instruct in self MLD for L UE, re assess sozo around 04/06/24    Larue Saddie SAUNDERS, PT 03/21/2024, 3:04 PM

## 2024-03-22 ENCOUNTER — Encounter: Admitting: Genetic Counselor

## 2024-03-22 ENCOUNTER — Ambulatory Visit

## 2024-03-22 ENCOUNTER — Ambulatory Visit
Admission: RE | Admit: 2024-03-22 | Discharge: 2024-03-22 | Disposition: A | Discharge status: Home / Self Care | Source: Ambulatory Visit | Attending: Adult Health | Admitting: Adult Health

## 2024-03-22 DIAGNOSIS — N644 Mastodynia: Secondary | ICD-10-CM

## 2024-03-22 DIAGNOSIS — R928 Other abnormal and inconclusive findings on diagnostic imaging of breast: Secondary | ICD-10-CM | POA: Diagnosis not present

## 2024-03-22 DIAGNOSIS — Z170 Estrogen receptor positive status [ER+]: Secondary | ICD-10-CM

## 2024-03-22 DIAGNOSIS — R92321 Mammographic fibroglandular density, right breast: Secondary | ICD-10-CM | POA: Diagnosis not present

## 2024-03-23 ENCOUNTER — Encounter: Payer: Self-pay | Admitting: Genetic Counselor

## 2024-03-23 ENCOUNTER — Ambulatory Visit: Payer: Self-pay | Admitting: Genetic Counselor

## 2024-03-23 DIAGNOSIS — Z1379 Encounter for other screening for genetic and chromosomal anomalies: Secondary | ICD-10-CM

## 2024-03-23 NOTE — Progress Notes (Signed)
 HPI:   Melanie Welch was previously seen in the [facility] due to a personal and family history of cancer and concerns regarding a hereditary predisposition to cancer. Please refer to our prior cancer genetics clinic note for more information regarding our discussion, assessment and recommendations, at the time. Melanie Welch recent genetic test results were disclosed to her, as were recommendations warranted by these results. These results and recommendations are discussed in more detail below.  CANCER HISTORY:  Oncology History  Malignant neoplasm of upper-outer quadrant of left breast in female, estrogen receptor positive (HCC)  03/30/2023 Initial Diagnosis   Palpable concern left axilla, mammogram showed 0.5 cm mass left breast 1:30 position possibly an intramammary lymph node, at the site of palpable concern there was an abnormal lymph node.  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 The recommendation was for a discussion about ALND.  Dr. Odean plans to discuss adj chemo with her also. He is seeing her 11/4.  Looks like she is scheduled for post op appt with Dr. Curvin 10/24.    06/06/2023 Cancer Staging   Staging form: Breast, AJCC 8th Edition - Pathologic: Stage IIA (pT2, pN1(sn), cM0, G3, ER+, PR+, HER2-) - Signed by Odean Potts, MD on 06/06/2023 Stage prefix: Initial diagnosis Method of lymph node assessment: Sentinel lymph node biopsy Histologic grading system: 3 grade system   06/20/2023 - 08/24/2023 Chemotherapy   Patient  is on Treatment Plan : BREAST TC q21d     09/27/2023 - 11/10/2023 Radiation Therapy   Plan Name: CW_L_BH_BO Site: Chest Wall, Left Technique: 3D Mode: Photon Dose Per Fraction: 1.8 Gy Prescribed Dose (Delivered / Prescribed): 25.2 Gy / 25.2 Gy Prescribed Fxs (Delivered / Prescribed): 14 / 14   Plan Name: CW_L_SCV_BH Site: Chest Wall, Left Technique: 3D Mode: Photon Dose Per Fraction: 1.8 Gy Prescribed Dose (Delivered / Prescribed): 50.4 Gy / 50.4 Gy Prescribed Fxs (Delivered / Prescribed): 28 / 28   Plan Name: CW_L_Bst_BO Site: Chest Wall, Left Technique: Electron Mode: Electron Dose Per Fraction: 2 Gy Prescribed Dose (Delivered / Prescribed): 10 Gy / 10 Gy Prescribed Fxs (Delivered / Prescribed): 5 / 5   Plan Name: CW_L_BH Site: Chest Wall, Left Technique: 3D Mode: Photon Dose Per Fraction: 1.8 Gy Prescribed Dose (Delivered / Prescribed): 25.2 Gy / 25.2 Gy Prescribed Fxs (Delivered / Prescribed): 14 / 14   12/2023 -  Anti-estrogen oral therapy   Anastrozole  x 5-7 years     FAMILY HISTORY:  We obtained a detailed, 4-generation family history.  Significant diagnoses are listed below:      Family History  Problem Relation Age of Onset   Ovarian cancer Mother 38 - 64   Multiple myeloma Sister 81 - 60   Cancer Maternal Uncle          unknown type   Cancer Maternal Uncle          unknown type   Cancer Paternal Aunt          unknown type   Cancer Half-Sister  unknown type dx. <50, paternal   Breast cancer Paternal Cousin          first cousin   Colon cancer Neg Hx             Melanie Welch is unaware of previous family history of genetic testing for hereditary cancer risks. There is no reported Ashkenazi Jewish ancestry.   GENETIC TEST RESULTS:  The [facility] CancerNext-Expanded Panel found no pathogenic mutations.  The CancerNext-Expanded gene panel offered by [facility] and includes sequencing, rearrangement, and RNA analysis for the following 77  genes: AIP, ALK, APC, ATM, AXIN2, BAP1, BARD1, BMPR1A, BRCA1, BRCA2, BRIP1, CDC73, CDH1, CDK4, CDKN1B, CDKN2A, CEBPA, CHEK2, CTNNA1, DDX41, DICER1, ETV6, FH, FLCN, GATA2, LZTR1, MAX, MBD4, MEN1, MET, MLH1, MSH2, MSH3, MSH6, MUTYH, NF1, NF2, NTHL1, PALB2, PHOX2B, PMS2, POT1, PRKAR1A, PTCH1, PTEN, RAD51C, RAD51D, RB1, RET, RPS20, RUNX1, SDHA, SDHAF2, SDHB, SDHC, SDHD, SMAD4, SMARCA4, SMARCB1, SMARCE1, STK11, SUFU, TMEM127, TP53, TSC1, TSC2, VHL, and WT1 (sequencing and deletion/duplication); EGFR, HOXB13, KIT, MITF, PDGFRA, POLD1, and POLE (sequencing only); EPCAM and GREM1 (deletion/duplication only).    The test report has been scanned into EPIC and is located under the Molecular Pathology section of the Results Review tab.  A portion of the result report is included below for reference. Genetic testing reported out on 03/15/2024.       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  At this time, it is unknown if this variant is associated with an increased risk for cancer or if it is benign, but most uncertain variants are reclassified to benign. It should not be used to make medical management decisions. With time, we suspect the laboratory will determine the significance of this variant, if any. If the laboratory reclassifies this variant, we will attempt to contact Melanie Welch to discuss it further.   Even though a pathogenic variant was not identified, possible explanations for the cancer in the family may include: There may be no hereditary risk for cancer in the family. The cancers in Melanie Welch and/or her family may be due to other genetic or environmental factors. There may be a gene mutation in one of these genes that current testing methods cannot detect, but that chance is small. There could be another gene that has not yet been discovered, or that we have not yet tested, that is responsible for the cancer diagnoses in the family.  It is  also possible there is a hereditary cause for the cancer in the family that Melanie Welch did not inherit. The variant of uncertain significance detected in the ATM gene may be reclassified as a pathogenic variant in the future. At this time, we do not know if this variant increases the risk for cancer.  Therefore, it is important to remain in touch with cancer genetics in the future so that we can continue to offer Melanie Welch the most up to date genetic testing.   ADDITIONAL GENETIC TESTING:  We discussed with Melanie Welch that her genetic testing was fairly extensive.  If there are genes identified to increase cancer risk that can be analyzed in the future, we would be happy to discuss and coordinate this testing at that time.    CANCER SCREENING RECOMMENDATIONS:  Melanie Welch test result is considered negative (normal).  This means that we have not identified a hereditary cause for her personal and family history of cancer at this time.   An individual's cancer risk and  medical management are not determined by genetic test results alone. Overall cancer risk assessment incorporates additional factors, including personal medical history, family history, and any available genetic information that may result in a personalized plan for cancer prevention and surveillance. Therefore, it is recommended she continue to follow the cancer management and screening guidelines provided by her oncology and primary healthcare provider.  RECOMMENDATIONS FOR FAMILY MEMBERS:   Since she did not inherit a mutation in a cancer predisposition gene included on this panel, her children could not have inherited a mutation from her in one of these genes. Individuals in this family might be at some increased risk of developing cancer, over the general population risk, due to the family history of cancer. We recommend women in this family have a yearly mammogram beginning at age 87, or 87 years younger than the earliest onset of  cancer, an annual clinical breast exam, and perform monthly breast self-exams. We do not recommend familial testing for the ATM variant of uncertain significance (VUS).  FOLLOW-UP:  Cancer genetics is a rapidly advancing field and it is possible that new genetic tests will be appropriate for her and/or her family members in the future. We encouraged her to remain in contact with cancer genetics on an annual basis so we can update her personal and family histories and let her know of advances in cancer genetics that may benefit this family.   Our contact number was provided. Melanie Welch questions were answered to her satisfaction, and she knows she is welcome to call us  at anytime with additional questions or concerns.   Zaron Zwiefelhofer, MS, [facility] Genetic Counselor [email or web address] (P) [phone]

## 2024-03-26 ENCOUNTER — Ambulatory Visit

## 2024-03-26 DIAGNOSIS — M25612 Stiffness of left shoulder, not elsewhere classified: Secondary | ICD-10-CM

## 2024-03-26 DIAGNOSIS — Z9189 Other specified personal risk factors, not elsewhere classified: Secondary | ICD-10-CM

## 2024-03-26 DIAGNOSIS — I972 Postmastectomy lymphedema syndrome: Secondary | ICD-10-CM

## 2024-03-26 DIAGNOSIS — I890 Lymphedema, not elsewhere classified: Secondary | ICD-10-CM | POA: Diagnosis not present

## 2024-03-26 DIAGNOSIS — Z923 Personal history of irradiation: Secondary | ICD-10-CM | POA: Diagnosis not present

## 2024-03-26 DIAGNOSIS — Z170 Estrogen receptor positive status [ER+]: Secondary | ICD-10-CM | POA: Diagnosis not present

## 2024-03-26 DIAGNOSIS — Z9221 Personal history of antineoplastic chemotherapy: Secondary | ICD-10-CM | POA: Diagnosis not present

## 2024-03-26 DIAGNOSIS — C50412 Malignant neoplasm of upper-outer quadrant of left female breast: Secondary | ICD-10-CM | POA: Diagnosis not present

## 2024-03-26 NOTE — Therapy (Signed)
 OUTPATIENT PHYSICAL THERAPY  UPPER EXTREMITY ONCOLOGY TREATMENT  Patient Name: Melanie Welch MRN: [id number] DOB:1945/09/25, 78 y.o., female Today's Date: 03/26/2024  END OF SESSION:  PT End of Session - 03/26/24 1110     Visit Number 5    Number of Visits 9    Date for PT Re-Evaluation 04/06/24    Authorization Type 1 re-eval and 9 visits 03/09/24-04/06/24    Authorization - Visit Number 5    Authorization - Number of Visits 9    PT Start Time 1109    PT Stop Time 1208    PT Time Calculation (min) 59 min    Activity Tolerance Patient tolerated treatment well    Behavior During Therapy [facility] for tasks assessed/performed            Past Medical History:  Diagnosis Date   Breast cancer (HCC) 03/30/2023   Colonic adenoma 12/2018   ONE   Diabetes mellitus    Fatty liver    Hypertension    MGUS (monoclonal gammopathy of unknown significance)    Personal history of chemotherapy    Personal history of radiation therapy    Port-A-Cath in place 07/05/2023   Proteinuria    Past Surgical History:  Procedure Laterality Date   ABDOMINAL HYSTERECTOMY     AXILLARY LYMPH NODE DISSECTION Left 06/10/2023   Procedure: COMPLETION LEFT AXILLARY LYMPH NODE DISSECTION;  Surgeon: Curvin Deward MOULD, MD;  Location: [facility];  Service: General;  Laterality: Left;   BREAST BIOPSY  2005   left   BREAST BIOPSY Left 03/30/2023   US  LT BREAST BX W LOC DEV EA ADD LESION IMG BX SPEC US  GUIDE 03/30/2023 [facility]   BREAST BIOPSY Left 03/30/2023   MM LT BREAST BX W LOC DEV 1ST LESION IMAGE BX SPEC STEREO GUIDE 03/30/2023 [facility]   BREAST BIOPSY Left 03/30/2023   US  LT BREAST BX W LOC DEV 1ST LESION IMG BX SPEC US  GUIDE 03/30/2023 [facility]   BREAST BIOPSY Left 05/05/2023   US  LT RADIOACTIVE SEED LOC 05/05/2023 [facility]   CATARACT EXTRACTION W/PHACO Right 05/10/2017   Procedure: CATARACT EXTRACTION PHACO AND INTRAOCULAR LENS PLACEMENT (IOC);  Surgeon:  Roz Anes, MD;  Location: [facility];  Service: Ophthalmology;  Laterality: Right;  CDE: 5.04   CATARACT EXTRACTION W/PHACO Left 05/24/2017   Procedure: CATARACT EXTRACTION PHACO AND INTRAOCULAR LENS PLACEMENT (IOC);  Surgeon: Roz Anes, MD;  Location: [facility];  Service: Ophthalmology;  Laterality: Left;  CDE: 8.40   COLONOSCOPY  07/13/2011   Procedure: COLONOSCOPY;  Surgeon: Margo CHRISTELLA Haddock, MD;  Location: [facility];  Service: Endoscopy;  Laterality: N/A;  9:30 AM   COLONOSCOPY N/A 12/09/2018   diverticulosis, suspect diverticulum in mid sigmoid s/p epi, unable to place clip. 5 mm benign polyp. Colonic lipoma. Normal TI. Many diverticula occluded with stool.    MASTECTOMY Left 2024   NODE DISSECTION Left 05/06/2023   Procedure: TARGETED NODE DISSECTION;  Surgeon: Curvin Deward MOULD, MD;  Location: [facility] OR;  Service: General;  Laterality: Left;   POLYPECTOMY  12/09/2018   Procedure: POLYPECTOMY;  Surgeon: Shaaron Lamar CHRISTELLA, MD;  Location: [facility];  Service: Endoscopy;;  splenic flexure   PORT-A-CATH REMOVAL N/A 02/22/2024   Procedure: REMOVAL PORT-A-CATH;  Surgeon: Curvin Deward MOULD, MD;  Location: [facility];  Service: General;  Laterality: N/A;   PORTACATH PLACEMENT N/A 06/10/2023   Procedure: INSERTION PORT-A-CATH WITH ULTRASOUND GUIDANCE;  Surgeon: Curvin Deward MOULD, MD;  Location: [facility];  Service: General;  Laterality: N/A;   SIMPLE MASTECTOMY WITH AXILLARY SENTINEL NODE BIOPSY Left 05/06/2023   Procedure: LEFT MASTECTOMY AND SENTINEL NODE BIOPSY;  Surgeon: Curvin Deward MOULD, MD;  Location: [facility] OR;  Service: General;  Laterality: Left;   Patient Active Problem List   Diagnosis Date Noted   Genetic testing 03/16/2024   Cancer of left female breast (HCC) 05/06/2023   Malignant neoplasm of upper-outer quadrant of left breast in female, estrogen receptor positive (HCC) 04/07/2023   Loss of weight 12/09/2021   Loose stools 10/03/2020   Fatty liver 07/18/2019    GIB (gastrointestinal bleeding) 12/10/2018   Lower GI bleed 12/08/2018   Nontoxic multinodular goiter 05/22/2015   Diabetes mellitus without complication (HCC) 05/22/2015   Essential hypertension, benign 05/22/2015   Palpable thyroid  08/29/2014   MGUS (monoclonal gammopathy of unknown significance) 08/28/2014    PCP: Norleen General, MD  REFERRING PROVIDER: Crawford Morna Pickle, NP   REFERRING DIAG:  [phone] (ICD-10-CM) - Malignant neoplasm of upper-outer quadrant of left breast in female, estrogen receptor positive (HCC) I89.0 (ICD-10-CM) - Lymphedema  THERAPY DIAG:  1. Postmastectomy lymphedema   2. Stiffness of left shoulder, not elsewhere classified   3. Malignant neoplasm of upper-outer quadrant of left breast in female, estrogen receptor positive (HCC)    ONSET DATE: 01/01/24  Rationale for Evaluation and Treatment: Rehabilitation  SUBJECTIVE:                                                                                                                                                                                           SUBJECTIVE STATEMENT: I had some tightness at my chest yesterday. I did some of the stretches and massage you guys have shown me and it feels better today.    PERTINENT HISTORY: Left breast cancer, ER positive ILC with positive node on biopsy. 05/06/23: Lt mastectomy 05/06/23 with 3/4 nodes positive. 06/10/23: ALND 0/1 nodes. Completed chemotherapy with Taxotere  and cytoxan , and radiation. Other hx includes: kidney disease, DM, liver disease.   PAIN:  Are you having pain?No, been feeling better since stretching  PRECAUTIONS: lymphedema risk Left side   RED FLAGS: None   WEIGHT BEARING RESTRICTIONS: No  FALLS:  Has patient fallen in last 6 months? No  LIVING ENVIRONMENT: Lives with: lives alone her daughter stays with her for a few days Lives in a house Stairs: No Medical equipment: None  OCCUPATION: Retired  LEISURE:  Gardening  HAND DOMINANCE: right   PRIOR LEVEL OF FUNCTION: Independent  PATIENT GOALS: learn how to manage lymphedema on her own and learn what to do and  what not to do   OBJECTIVE: Note: Objective measures were completed at Evaluation unless otherwise noted.  COGNITION: Overall cognitive status: Within functional limits for tasks assessed   PALPATION:   OBSERVATIONS / OTHER ASSESSMENTS:   POSTURE: rounded shoulders    UPPER EXTREMITY AROM/PROM:  A/PROM RIGHT   eval   Shoulder extension 75  Shoulder flexion 150  Shoulder abduction 156  Shoulder internal rotation 36  Shoulder external rotation 86    (Blank rows = not tested)  A/PROM LEFT   eval 03/21/24  Shoulder extension 55   Shoulder flexion 115 119  Shoulder abduction 112 112  Shoulder internal rotation 42   Shoulder external rotation 89     (Blank rows = not tested)  CERVICAL AROM: All within normal limits:   LYMPHEDEMA ASSESSMENTS:   LANDMARK RIGHT  eval  At axilla  31.5  15 cm proximal to olecranon process 28.2  10 cm proximal to olecranon process 27.5  Olecranon process 26  15 cm proximal to ulnar styloid process 24.1  10 cm proximal to ulnar styloid process 20  Just proximal to ulnar styloid process 17  Across hand at thumb web space 20  At base of 2nd digit 6.4  (Blank rows = not tested)  LANDMARK LEFT  eval  At axilla  31  15 cm proximal to olecranon process 29  10 cm proximal to olecranon process 29  Olecranon process 26.7  15 cm proximal to ulnar styloid process 24  10 cm proximal to ulnar styloid process 19.4  Just proximal to ulnar styloid process 17.1  Across hand at thumb web space 19.8  At base of 2nd digit 6.5  (Blank rows = not tested)   L-DEX LYMPHEDEMA SCREENING: The patient was assessed using the L-Dex machine today to produce a lymphedema index baseline score. The patient will be reassessed on a regular basis (typically every 3 months) to obtain new L-Dex scores. If the  score is > 6.5 points away from his/her baseline score indicating onset of subclinical lymphedema, it will be recommended to wear a compression garment for 4 weeks, 12 hours per day and then be reassessed. If the score continues to be > 6.5 points from baseline at reassessment, we will initiate lymphedema treatment. Assessing in this manner has a 95% rate of preventing clinically significant lymphedema.     Flowsheet Row Outpatient Rehab from 03/09/2024 in [facility]  Lymphedema Life Impact Scale Total Score 14.71 %    TODAY'S TREATMENT:                                                                                                                                         03/26/24: Therapeutic Exercise Pulleys into flex and abd x 2 mins each returning therapist demo and VC's throughout with demo to decrease Lt scapular compensations Roll yellow ball up wall into flex x  10 and Lt abd x 10 with VC's to decrease Lt scapular compensation during exs Modified downward dog on wall x 5 reps, 5 sec holds returning therapist demo  Therapeutic Activities Supine over half foam roll for following: Bil horz abd x 10, bil UE scaption into a V x 10 reps, and then UE snow angel x 10 reps with 5 sec holds, Lt UE pro/ret x 10 Manual Therapy P/ROM to Lt shoulder into flex, scaption and er/IR with scapular depression by therapist throughout, end motions limited by pain STM in supine to Lt pect tendon which is still palpably very tight but pt reports this feeling looser since starting PT.  MLD to Lt UE: Short neck, 5 diaphragmatic breaths, Lt inguinal and Rt axillary nodes, Lt axillo-inguinal and anterior inter-axillary anastomosis, then focused on Lt upper arm (lateral, medial to lateral, and then lateral again). Began instructing pt in basics of anatomy of the lymphatic system and principles of MLD.    03/21/24: Therapeutic Exercise Pulleys into flex and abd x 2 mins each returning  therapist demo and VC's throughout with demo to decrease Lt scapular compensations Roll yellow ball up wall into flex x 10 and Lt abd x 5 Doorway Pectoralis Stretch x 4 reps, 20 sec holds and tactile cues to decrease compensations due to tight Lt shoulder Therapeutic Activities Supine over half foam roll for following: Bil horz abd prolonged stretch x 60, bil UE scaption into a V x 10 reps, and then UE snow angel x 10 reps, pro/ret x 5 Manual Therapy P/ROM to Lt shoulder into flex, scaption and er with scapular depression by therapist throughout  STM in supine to Lt pect tendon and then in Rt S/L to medial scapula border and Lt UT \ 03/19/24: Therapeutic Exercise Pulleys into flex and abd x 2 mins each returning therapist demo and VC's throughout with demo to decrease Lt scapular compensations Roll yellow ball up wall into flex x 10 and Lt abd x 5, tactile during abd for correct UE position Modified downward dog on wall x 5 reps, 5 sec holds returning therapist demo Doorway Pectoralis Stretch x 4 reps, 20 sec holds and tactile cues to decrease compensations due to tight Lt shoulder Therapeutic Activities Supine over half foam roll for following: Bil horz abd but slightly lower than horz due to post shoulder pain initially, this was gone with lowered UE position x 5 reps; bil UE scaption into a V x 7 reps, and then il UE snow angel x 5 reps, 5 sec holds returning demo for each. Manual Therapy P/ROM to Lt shoulder into flex, scaption and er with scapular depression by therapist throughout  STM in supine to Lt pect tendon and then in Rt S/L to medial scapula border and Lt UT Scap Mobs into protraction and retraction when in Rt S/L    PATIENT EDUCATION:  Education details: lymphedema education, compression sleeve wear and donning/doffing, post op exercises Person educated: Patient Education method: Programmer, multimedia, Demonstration, Verbal cues, and Handouts Education comprehension: verbalized  understanding and needs further education  HOME EXERCISE PROGRAM: Post op exercises Wear compression sleeve 12 hours/day during waking hours  ASSESSMENT:  CLINICAL IMPRESSION: Pt reports tightness over weekend that she was able to self treat and was glad she knew how. Tightness was gone today. Continued with A/AA/ROM stretching and manual therapy. Also began instructing pt in basics of anatomy of lymphatic system and principles of MLD while beginning to perform MLD. Only did sequence to upper arm  today to begin instruction of this. Pt will benefit from further instruction of this. Answered pts questions regarding what she can do if her shoulder conts to be limited. Let her know that seeing an orthopedist is generally the next step if she conts to be limited with her end motion and pt verbalized understanding.   OBJECTIVE IMPAIRMENTS: decreased activity tolerance, decreased knowledge of condition, decreased knowledge of use of DME, decreased ROM, increased edema, increased fascial restrictions, impaired UE functional use, and postural dysfunction.   ACTIVITY LIMITATIONS: carrying, lifting, and reach over head  PARTICIPATION LIMITATIONS: none  PERSONAL FACTORS: Age and 1 comorbidity: diabetes, radiation to axilla are also affecting patient's functional outcome.   REHAB POTENTIAL: Excellent  CLINICAL DECISION MAKING: Stable/uncomplicated  EVALUATION COMPLEXITY: Low  GOALS: Goals reviewed with patient? Yes  SHORT TERM GOALS=LTGs: Target date: 04/06/24  Pt will improve Lt shoulder flexion and abduction to equal to the opposite side to demonstrate improved reach  Baseline: Goal status: INITIAL  2.  Pt will be ind with a home exercise program for continued stretching and strengthening. Baseline:  Goal status: INITIAL  3.  Pt will be independent in self MLD for long term management of lymphedema.  Baseline:  Goal status: INITIAL  4. Pt will be educated on lymphedema surveillance,  presentations and risk reduction  Goal Status: INITIAL  5. Pt will return to the green on ldex screening to demonstrate reversal of subclinical lymphedema.  Goal status: INITIAL  PLAN:  PT FREQUENCY: 2x/week  PT DURATION: 4 weeks  PLANNED INTERVENTIONS: [id number]- PT Re-evaluation, 97110-Therapeutic exercises, 97530- Therapeutic activity, 97112- Neuromuscular re-education, 97535- Self Care, [id number]- Manual therapy, [phone]- Orthotic Initial, [phone]- Orthotic/Prosthetic subsequent, Patient/Family education, Balance training, Therapeutic exercises, and Self Care  PLAN FOR NEXT SESSION: Cont Lt shoulder AAROM, AROM, PROM and scap mobility, STM to tight Lt pect insertion, review self MLD for Lt UE, re assess sozo around 04/06/24    Aden Berwyn Caldron, PTA 03/26/2024, 12:28 PM

## 2024-03-28 ENCOUNTER — Ambulatory Visit

## 2024-03-28 DIAGNOSIS — Z9221 Personal history of antineoplastic chemotherapy: Secondary | ICD-10-CM | POA: Diagnosis not present

## 2024-03-28 DIAGNOSIS — C50412 Malignant neoplasm of upper-outer quadrant of left female breast: Secondary | ICD-10-CM | POA: Diagnosis not present

## 2024-03-28 DIAGNOSIS — I890 Lymphedema, not elsewhere classified: Secondary | ICD-10-CM | POA: Diagnosis not present

## 2024-03-28 DIAGNOSIS — Z170 Estrogen receptor positive status [ER+]: Secondary | ICD-10-CM | POA: Diagnosis not present

## 2024-03-28 DIAGNOSIS — M25612 Stiffness of left shoulder, not elsewhere classified: Secondary | ICD-10-CM

## 2024-03-28 DIAGNOSIS — Z923 Personal history of irradiation: Secondary | ICD-10-CM | POA: Diagnosis not present

## 2024-03-28 DIAGNOSIS — I972 Postmastectomy lymphedema syndrome: Secondary | ICD-10-CM

## 2024-03-28 DIAGNOSIS — Z9189 Other specified personal risk factors, not elsewhere classified: Secondary | ICD-10-CM

## 2024-03-28 NOTE — Patient Instructions (Addendum)
 SABRA

## 2024-03-28 NOTE — Therapy (Signed)
 OUTPATIENT PHYSICAL THERAPY  UPPER EXTREMITY ONCOLOGY TREATMENT  Patient Name: Melanie Welch MRN: [id number] DOB:Nov 11, 1945, 78 y.o., female Today's Date: 03/28/2024  END OF SESSION:  PT End of Session - 03/28/24 1057     Visit Number 6    Number of Visits 9    Date for PT Re-Evaluation 04/06/24    Authorization Type 1 re-eval and 9 visits 03/09/24-04/06/24    Authorization - Visit Number 6    Authorization - Number of Visits 9    PT Start Time 1100    PT Stop Time 1200    PT Time Calculation (min) 60 min    Activity Tolerance Patient tolerated treatment well    Behavior During Therapy WFL for tasks assessed/performed            Past Medical History:  Diagnosis Date   Breast cancer (HCC) 03/30/2023   Colonic adenoma 12/2018   ONE   Diabetes mellitus    Fatty liver    Hypertension    MGUS (monoclonal gammopathy of unknown significance)    Personal history of chemotherapy    Personal history of radiation therapy    Port-A-Cath in place 07/05/2023   Proteinuria    Past Surgical History:  Procedure Laterality Date   ABDOMINAL HYSTERECTOMY     AXILLARY LYMPH NODE DISSECTION Left 06/10/2023   Procedure: COMPLETION LEFT AXILLARY LYMPH NODE DISSECTION;  Surgeon: Curvin Deward MOULD, MD;  Location: [facility];  Service: General;  Laterality: Left;   BREAST BIOPSY  2005   left   BREAST BIOPSY Left 03/30/2023   US  LT BREAST BX W LOC DEV EA ADD LESION IMG BX SPEC US  GUIDE 03/30/2023 [facility]   BREAST BIOPSY Left 03/30/2023   MM LT BREAST BX W LOC DEV 1ST LESION IMAGE BX SPEC STEREO GUIDE 03/30/2023 [facility]   BREAST BIOPSY Left 03/30/2023   US  LT BREAST BX W LOC DEV 1ST LESION IMG BX SPEC US  GUIDE 03/30/2023 [facility]   BREAST BIOPSY Left 05/05/2023   US  LT RADIOACTIVE SEED LOC 05/05/2023 [facility]   CATARACT EXTRACTION W/PHACO Right 05/10/2017   Procedure: CATARACT EXTRACTION PHACO AND INTRAOCULAR LENS PLACEMENT (IOC);  Surgeon:  Roz Anes, MD;  Location: [facility];  Service: Ophthalmology;  Laterality: Right;  CDE: 5.04   CATARACT EXTRACTION W/PHACO Left 05/24/2017   Procedure: CATARACT EXTRACTION PHACO AND INTRAOCULAR LENS PLACEMENT (IOC);  Surgeon: Roz Anes, MD;  Location: [facility];  Service: Ophthalmology;  Laterality: Left;  CDE: 8.40   COLONOSCOPY  07/13/2011   Procedure: COLONOSCOPY;  Surgeon: Margo CHRISTELLA Haddock, MD;  Location: [facility];  Service: Endoscopy;  Laterality: N/A;  9:30 AM   COLONOSCOPY N/A 12/09/2018   diverticulosis, suspect diverticulum in mid sigmoid s/p epi, unable to place clip. 5 mm benign polyp. Colonic lipoma. Normal TI. Many diverticula occluded with stool.    MASTECTOMY Left 2024   NODE DISSECTION Left 05/06/2023   Procedure: TARGETED NODE DISSECTION;  Surgeon: Curvin Deward MOULD, MD;  Location: [facility];  Service: General;  Laterality: Left;   POLYPECTOMY  12/09/2018   Procedure: POLYPECTOMY;  Surgeon: Shaaron Lamar CHRISTELLA, MD;  Location: [facility];  Service: Endoscopy;;  splenic flexure   PORT-A-CATH REMOVAL N/A 02/22/2024   Procedure: REMOVAL PORT-A-CATH;  Surgeon: Curvin Deward MOULD, MD;  Location: [facility];  Service: General;  Laterality: N/A;   PORTACATH PLACEMENT N/A 06/10/2023   Procedure: INSERTION PORT-A-CATH WITH ULTRASOUND GUIDANCE;  Surgeon: Curvin Deward MOULD, MD;  Location: [facility];  Service: General;  Laterality: N/A;   SIMPLE MASTECTOMY WITH AXILLARY SENTINEL NODE BIOPSY Left 05/06/2023   Procedure: LEFT MASTECTOMY AND SENTINEL NODE BIOPSY;  Surgeon: Curvin Deward MOULD, MD;  Location: [facility];  Service: General;  Laterality: Left;   Patient Active Problem List   Diagnosis Date Noted   Genetic testing 03/16/2024   Cancer of left female breast (HCC) 05/06/2023   Malignant neoplasm of upper-outer quadrant of left breast in female, estrogen receptor positive (HCC) 04/07/2023   Loss of weight 12/09/2021   Loose stools 10/03/2020   Fatty liver 07/18/2019    GIB (gastrointestinal bleeding) 12/10/2018   Lower GI bleed 12/08/2018   Nontoxic multinodular goiter 05/22/2015   Diabetes mellitus without complication (HCC) 05/22/2015   Essential hypertension, benign 05/22/2015   Palpable thyroid  08/29/2014   MGUS (monoclonal gammopathy of unknown significance) 08/28/2014    PCP: Norleen General, MD  REFERRING PROVIDER: Crawford Morna Pickle, NP   REFERRING DIAG:  [phone] (ICD-10-CM) - Malignant neoplasm of upper-outer quadrant of left breast in female, estrogen receptor positive (HCC) I89.0 (ICD-10-CM) - Lymphedema  THERAPY DIAG:  1. Postmastectomy lymphedema   2. Stiffness of left shoulder, not elsewhere classified   3. Malignant neoplasm of upper-outer quadrant of left breast in female, estrogen receptor positive (HCC)    ONSET DATE: 01/01/24  Rationale for Evaluation and Treatment: Rehabilitation  SUBJECTIVE:                                                                                                                                                                                           SUBJECTIVE STATEMENT: My shoulder tightness is definitely improving. The chest tightness I noticed since last time I've pinpointed down to being above the incision.   PERTINENT HISTORY: Left breast cancer, ER positive ILC with positive node on biopsy. 05/06/23: Lt mastectomy 05/06/23 with 3/4 nodes positive. 06/10/23: ALND 0/1 nodes. Completed chemotherapy with Taxotere  and cytoxan , and radiation. Other hx includes: kidney disease, DM, liver disease.   PAIN:  Are you having pain?No, been feeling better since stretching  PRECAUTIONS: lymphedema risk Left side   RED FLAGS: None   WEIGHT BEARING RESTRICTIONS: No  FALLS:  Has patient fallen in last 6 months? No  LIVING ENVIRONMENT: Lives with: lives alone her daughter stays with her for a few days Lives in a house Stairs: No Medical equipment: None  OCCUPATION: Retired  LEISURE:  Gardening  HAND DOMINANCE: right   PRIOR LEVEL OF FUNCTION: Independent  PATIENT GOALS: learn how to manage lymphedema on her own and learn what to do and what not to do  OBJECTIVE: Note: Objective measures were completed at Evaluation unless otherwise noted.  COGNITION: Overall cognitive status: Within functional limits for tasks assessed   PALPATION:   OBSERVATIONS / OTHER ASSESSMENTS:   POSTURE: rounded shoulders    UPPER EXTREMITY AROM/PROM:  A/PROM RIGHT   eval   Shoulder extension 75  Shoulder flexion 150  Shoulder abduction 156  Shoulder internal rotation 36  Shoulder external rotation 86    (Blank rows = not tested)  A/PROM LEFT   eval 03/21/24  Shoulder extension 55   Shoulder flexion 115 119  Shoulder abduction 112 112  Shoulder internal rotation 42   Shoulder external rotation 89     (Blank rows = not tested)  CERVICAL AROM: All within normal limits:   LYMPHEDEMA ASSESSMENTS:   LANDMARK RIGHT  eval  At axilla  31.5  15 cm proximal to olecranon process 28.2  10 cm proximal to olecranon process 27.5  Olecranon process 26  15 cm proximal to ulnar styloid process 24.1  10 cm proximal to ulnar styloid process 20  Just proximal to ulnar styloid process 17  Across hand at thumb web space 20  At base of 2nd digit 6.4  (Blank rows = not tested)  LANDMARK LEFT  eval  At axilla  31  15 cm proximal to olecranon process 29  10 cm proximal to olecranon process 29  Olecranon process 26.7  15 cm proximal to ulnar styloid process 24  10 cm proximal to ulnar styloid process 19.4  Just proximal to ulnar styloid process 17.1  Across hand at thumb web space 19.8  At base of 2nd digit 6.5  (Blank rows = not tested)   L-DEX LYMPHEDEMA SCREENING: The patient was assessed using the L-Dex machine today to produce a lymphedema index baseline score. The patient will be reassessed on a regular basis (typically every 3 months) to obtain new L-Dex scores. If the  score is > 6.5 points away from his/her baseline score indicating onset of subclinical lymphedema, it will be recommended to wear a compression garment for 4 weeks, 12 hours per day and then be reassessed. If the score continues to be > 6.5 points from baseline at reassessment, we will initiate lymphedema treatment. Assessing in this manner has a 95% rate of preventing clinically significant lymphedema.     Flowsheet Row Outpatient Rehab from 03/09/2024 in [facility]  Lymphedema Life Impact Scale Total Score 14.71 %    TODAY'S TREATMENT:                                                                                                                                         03/28/24: Therapeutic Exercise Pulleys into flex and abd x 2 mins each returning therapist demo and VC's throughout with demo to decrease Lt scapular compensations Roll yellow ball up wall into flex x 10 and Lt abd x 10  with 1# added to Rt wrist, pt reports liking challenge for this Doorway Stretch for Rt UE pectoralis stretch with arm in horz abd x 5 reps, 10 sec holds Therapeutic Activities Supine Scapular Series with yellow theraband returning therapist demo and x 10 each, handout issued Manual Therapy P/ROM to Lt shoulder into flex, scaption and er/IR with scapular depression by therapist throughout, end motions limited by pain STM in supine to Lt pect tendon which is still palpably very tight but pt reports this feeling looser since starting PT.  MLD to Lt UE: Short neck, 5 diaphragmatic breaths, Lt inguinal and Rt axillary nodes, Lt axillo-inguinal and anterior inter-axillary anastomosis, then focused on Lt upper arm (lateral, medial to lateral, and then lateral again). Began instructing pt in basics of anatomy of the lymphatic system and principles of MLD.   03/26/24: Therapeutic Exercise Pulleys into flex and abd x 2 mins each returning therapist demo and VC's throughout with demo to decrease Lt  scapular compensations Roll yellow ball up wall into flex x 10 and Lt abd x 10 with VC's to decrease Lt scapular compensation during exs Modified downward dog on wall x 5 reps, 5 sec holds returning therapist demo  Therapeutic Activities Supine over half foam roll for following: Bil horz abd x 10, bil UE scaption into a V x 10 reps, and then UE snow angel x 10 reps with 5 sec holds, Lt UE pro/ret x 10 Manual Therapy P/ROM to Lt shoulder into flex, scaption and er/IR with scapular depression by therapist throughout, end motions limited by pain STM in supine to Lt pect tendon which is still palpably very tight but pt reports this feeling looser since starting PT; also to Lt chest wall at tightness across chest to incision MLD to Lt UE: Short neck, 5 diaphragmatic breaths, Lt inguinal and Rt axillary nodes, Lt axillo-inguinal and anterior inter-axillary anastomosis, then focused on Lt upper arm (lateral, medial to lateral, and then lateral again). Began instructing pt in basics of anatomy of the lymphatic system and principles of MLD.    03/21/24: Therapeutic Exercise Pulleys into flex and abd x 2 mins each returning therapist demo and VC's throughout with demo to decrease Lt scapular compensations Roll yellow ball up wall into flex x 10 and Lt abd x 5 Doorway Pectoralis Stretch x 4 reps, 20 sec holds and tactile cues to decrease compensations due to tight Lt shoulder Therapeutic Activities Supine over half foam roll for following: Bil horz abd prolonged stretch x 60, bil UE scaption into a V x 10 reps, and then UE snow angel x 10 reps, pro/ret x 5 Manual Therapy P/ROM to Lt shoulder into flex, scaption and er with scapular depression by therapist throughout  STM in supine to Lt pect tendon and then in Rt S/L to medial scapula border and Lt UT  03/19/24: Therapeutic Exercise Pulleys into flex and abd x 2 mins each returning therapist demo and VC's throughout with demo to decrease Lt scapular  compensations Roll yellow ball up wall into flex x 10 and Lt abd x 5, tactile during abd for correct UE position Modified downward dog on wall x 5 reps, 5 sec holds returning therapist demo Doorway Pectoralis Stretch x 4 reps, 20 sec holds and tactile cues to decrease compensations due to tight Lt shoulder Therapeutic Activities Supine over half foam roll for following: Bil horz abd but slightly lower than horz due to post shoulder pain initially, this was gone with lowered UE position  x 5 reps; bil UE scaption into a V x 7 reps, and then il UE snow angel x 5 reps, 5 sec holds returning demo for each. Manual Therapy P/ROM to Lt shoulder into flex, scaption and er with scapular depression by therapist throughout  STM in supine to Lt pect tendon and then in Rt S/L to medial scapula border and Lt UT Scap Mobs into protraction and retraction when in Rt S/L    PATIENT EDUCATION:  Education details: Supine scapular series with yellow theraband  Person educated: Patient Education method: Programmer, multimedia, Demonstration, Verbal cues, and Handouts Education comprehension: verbalized understanding and needs further education, tactile and VC's during  HOME EXERCISE PROGRAM: Post op exercises Wear compression sleeve 12 hours/day during waking hours Supine scapular series with yellow theraband  ASSESSMENT:  CLINICAL IMPRESSION: Continued with A/AA/ROM stretches to decrease Rt upper quadrant tightness. Added 1# for increased resistance with stretches and pt reports liking feeling challenge of this. Then continued with manual therapy working to decrease chest tightness which is due to scar tissue. Also added new doorway stretch for pt to work on this at home.   OBJECTIVE IMPAIRMENTS: decreased activity tolerance, decreased knowledge of condition, decreased knowledge of use of DME, decreased ROM, increased edema, increased fascial restrictions, impaired UE functional use, and postural dysfunction.    ACTIVITY LIMITATIONS: carrying, lifting, and reach over head  PARTICIPATION LIMITATIONS: none  PERSONAL FACTORS: Age and 1 comorbidity: diabetes, radiation to axilla are also affecting patient's functional outcome.   REHAB POTENTIAL: Excellent  CLINICAL DECISION MAKING: Stable/uncomplicated  EVALUATION COMPLEXITY: Low  GOALS: Goals reviewed with patient? Yes  SHORT TERM GOALS=LTGs: Target date: 04/06/24  Pt will improve Lt shoulder flexion and abduction to equal to the opposite side to demonstrate improved reach  Baseline: Goal status: INITIAL  2.  Pt will be ind with a home exercise program for continued stretching and strengthening. Baseline:  Goal status: INITIAL  3.  Pt will be independent in self MLD for long term management of lymphedema.  Baseline:  Goal status: INITIAL  4. Pt will be educated on lymphedema surveillance, presentations and risk reduction  Goal Status: INITIAL  5. Pt will return to the green on ldex screening to demonstrate reversal of subclinical lymphedema.  Goal status: INITIAL  PLAN:  PT FREQUENCY: 2x/week  PT DURATION: 4 weeks  PLANNED INTERVENTIONS: [id number]- PT Re-evaluation, 97110-Therapeutic exercises, 97530- Therapeutic activity, 97112- Neuromuscular re-education, 97535- Self Care, [id number]- Manual therapy, [phone]- Orthotic Initial, [phone]- Orthotic/Prosthetic subsequent, Patient/Family education, Balance training, Therapeutic exercises, and Self Care  PLAN FOR NEXT SESSION: Cont Lt shoulder AAROM, AROM, PROM and scap mobility, STM to tight Lt pect insertion, review self MLD for Lt UE, re assess sozo around 04/06/24    Aden Berwyn Caldron, PTA 03/28/2024, 12:04 PM   Over Head Pull: Narrow and Wide Grip   Cancer Rehab [phone]   On back, knees bent, feet flat, band across thighs, elbows straight but relaxed. Pull hands apart (start). Keeping elbows straight, bring arms up and over head, hands toward floor. Keep pull steady on band. Hold  momentarily. Return slowly, keeping pull steady, back to start. Then do same with a wider grip on the band (past shoulder width) Repeat _5-10__ times. Band color __yellow____   Side Pull: Double Arm   On back, knees bent, feet flat. Arms perpendicular to body, shoulder level, elbows straight but relaxed. Pull arms out to sides, elbows straight. Resistance band comes across collarbones, hands toward floor. Hold momentarily. Slowly return  to starting position. Repeat _5-10__ times. Band color _yellow____   Sword   On back, knees bent, feet flat, left hand on left hip, right hand above left. Pull right arm DIAGONALLY (hip to shoulder) across chest. Bring right arm along head toward floor. Hold momentarily. Slowly return to starting position. Repeat _5-10__ times. Do with left arm. Band color _yellow_____   Shoulder Rotation: Double Arm   On back, knees bent, feet flat, elbows tucked at sides, bent 90, hands palms up. Pull hands apart and down toward floor, keeping elbows near sides. Hold momentarily. Slowly return to starting position. Repeat _5-10__ times. Band color __yellow____

## 2024-04-02 DIAGNOSIS — C50412 Malignant neoplasm of upper-outer quadrant of left female breast: Secondary | ICD-10-CM | POA: Diagnosis not present

## 2024-04-03 ENCOUNTER — Encounter: Payer: Self-pay | Admitting: Physical Therapy

## 2024-04-03 ENCOUNTER — Ambulatory Visit: Attending: Adult Health | Admitting: Physical Therapy

## 2024-04-03 DIAGNOSIS — I972 Postmastectomy lymphedema syndrome: Secondary | ICD-10-CM | POA: Insufficient documentation

## 2024-04-03 DIAGNOSIS — M25612 Stiffness of left shoulder, not elsewhere classified: Secondary | ICD-10-CM | POA: Diagnosis not present

## 2024-04-03 DIAGNOSIS — Z9189 Other specified personal risk factors, not elsewhere classified: Secondary | ICD-10-CM | POA: Diagnosis not present

## 2024-04-03 DIAGNOSIS — Z170 Estrogen receptor positive status [ER+]: Secondary | ICD-10-CM | POA: Diagnosis not present

## 2024-04-03 DIAGNOSIS — C50412 Malignant neoplasm of upper-outer quadrant of left female breast: Secondary | ICD-10-CM | POA: Insufficient documentation

## 2024-04-03 NOTE — Therapy (Signed)
 OUTPATIENT PHYSICAL THERAPY  UPPER EXTREMITY ONCOLOGY TREATMENT  Patient Name: Melanie Welch MRN: [id number] DOB:03-02-1946, 78 y.o., female Today's Date: 04/03/2024  END OF SESSION:  PT End of Session - 04/03/24 1153     Visit Number 7    Number of Visits 9    Date for PT Re-Evaluation 04/06/24    Authorization Type 1 re-eval and 9 visits 03/09/24-04/06/24    Authorization - Visit Number 7    Authorization - Number of Visits 9    Progress Note Due on Visit 10    PT Start Time 1103    PT Stop Time 1153    PT Time Calculation (min) 50 min    Activity Tolerance Patient tolerated treatment well    Behavior During Therapy [facility] for tasks assessed/performed             Past Medical History:  Diagnosis Date   Breast cancer (HCC) 03/30/2023   Colonic adenoma 12/2018   ONE   Diabetes mellitus    Fatty liver    Hypertension    MGUS (monoclonal gammopathy of unknown significance)    Personal history of chemotherapy    Personal history of radiation therapy    Port-A-Cath in place 07/05/2023   Proteinuria    Past Surgical History:  Procedure Laterality Date   ABDOMINAL HYSTERECTOMY     AXILLARY LYMPH NODE DISSECTION Left 06/10/2023   Procedure: COMPLETION LEFT AXILLARY LYMPH NODE DISSECTION;  Surgeon: Curvin Deward MOULD, MD;  Location: [facility];  Service: General;  Laterality: Left;   BREAST BIOPSY  2005   left   BREAST BIOPSY Left 03/30/2023   US  LT BREAST BX W LOC DEV EA ADD LESION IMG BX SPEC US  GUIDE 03/30/2023 [facility]   BREAST BIOPSY Left 03/30/2023   MM LT BREAST BX W LOC DEV 1ST LESION IMAGE BX SPEC STEREO GUIDE 03/30/2023 [facility]   BREAST BIOPSY Left 03/30/2023   US  LT BREAST BX W LOC DEV 1ST LESION IMG BX SPEC US  GUIDE 03/30/2023 [facility]   BREAST BIOPSY Left 05/05/2023   US  LT RADIOACTIVE SEED LOC 05/05/2023 [facility]   CATARACT EXTRACTION W/PHACO Right 05/10/2017   Procedure: CATARACT EXTRACTION PHACO AND INTRAOCULAR  LENS PLACEMENT (IOC);  Surgeon: Roz Anes, MD;  Location: [facility];  Service: Ophthalmology;  Laterality: Right;  CDE: 5.04   CATARACT EXTRACTION W/PHACO Left 05/24/2017   Procedure: CATARACT EXTRACTION PHACO AND INTRAOCULAR LENS PLACEMENT (IOC);  Surgeon: Roz Anes, MD;  Location: [facility];  Service: Ophthalmology;  Laterality: Left;  CDE: 8.40   COLONOSCOPY  07/13/2011   Procedure: COLONOSCOPY;  Surgeon: Margo CHRISTELLA Haddock, MD;  Location: [facility];  Service: Endoscopy;  Laterality: N/A;  9:30 AM   COLONOSCOPY N/A 12/09/2018   diverticulosis, suspect diverticulum in mid sigmoid s/p epi, unable to place clip. 5 mm benign polyp. Colonic lipoma. Normal TI. Many diverticula occluded with stool.    MASTECTOMY Left 2024   NODE DISSECTION Left 05/06/2023   Procedure: TARGETED NODE DISSECTION;  Surgeon: Curvin Deward MOULD, MD;  Location: [facility] OR;  Service: General;  Laterality: Left;   POLYPECTOMY  12/09/2018   Procedure: POLYPECTOMY;  Surgeon: Shaaron Lamar CHRISTELLA, MD;  Location: [facility];  Service: Endoscopy;;  splenic flexure   PORT-A-CATH REMOVAL N/A 02/22/2024   Procedure: REMOVAL PORT-A-CATH;  Surgeon: Curvin Deward MOULD, MD;  Location: [facility];  Service: General;  Laterality: N/A;   PORTACATH PLACEMENT N/A 06/10/2023   Procedure: INSERTION  PORT-A-CATH WITH ULTRASOUND GUIDANCE;  Surgeon: Curvin Deward MOULD, MD;  Location: [facility];  Service: General;  Laterality: N/A;   SIMPLE MASTECTOMY WITH AXILLARY SENTINEL NODE BIOPSY Left 05/06/2023   Procedure: LEFT MASTECTOMY AND SENTINEL NODE BIOPSY;  Surgeon: Curvin Deward MOULD, MD;  Location: [facility] OR;  Service: General;  Laterality: Left;   Patient Active Problem List   Diagnosis Date Noted   Genetic testing 03/16/2024   Cancer of left female breast (HCC) 05/06/2023   Malignant neoplasm of upper-outer quadrant of left breast in female, estrogen receptor positive (HCC) 04/07/2023   Loss of weight 12/09/2021   Loose stools  10/03/2020   Fatty liver 07/18/2019   GIB (gastrointestinal bleeding) 12/10/2018   Lower GI bleed 12/08/2018   Nontoxic multinodular goiter 05/22/2015   Diabetes mellitus without complication (HCC) 05/22/2015   Essential hypertension, benign 05/22/2015   Palpable thyroid  08/29/2014   MGUS (monoclonal gammopathy of unknown significance) 08/28/2014    PCP: Norleen General, MD  REFERRING PROVIDER: Crawford Morna Pickle, NP   REFERRING DIAG:  [phone] (ICD-10-CM) - Malignant neoplasm of upper-outer quadrant of left breast in female, estrogen receptor positive (HCC) I89.0 (ICD-10-CM) - Lymphedema  THERAPY DIAG:  1. Postmastectomy lymphedema   2. Stiffness of left shoulder, not elsewhere classified   3. Malignant neoplasm of upper-outer quadrant of left breast in female, estrogen receptor positive (HCC)    ONSET DATE: 01/01/24  Rationale for Evaluation and Treatment: Rehabilitation  SUBJECTIVE:                                                                                                                                                                                           SUBJECTIVE STATEMENT: I am only having pain across my chest and in my joint.   PERTINENT HISTORY: Left breast cancer, ER positive ILC with positive node on biopsy. 05/06/23: Lt mastectomy 05/06/23 with 3/4 nodes positive. 06/10/23: ALND 0/1 nodes. Completed chemotherapy with Taxotere  and cytoxan , and radiation. Other hx includes: kidney disease, DM, liver disease.   PAIN:  Are you having pain?No, been feeling better since stretching  PRECAUTIONS: lymphedema risk Left side   RED FLAGS: None   WEIGHT BEARING RESTRICTIONS: No  FALLS:  Has patient fallen in last 6 months? No  LIVING ENVIRONMENT: Lives with: lives alone her daughter stays with her for a few days Lives in a house Stairs: No Medical equipment: None  OCCUPATION: Retired  LEISURE: Gardening  HAND DOMINANCE: right   PRIOR LEVEL  OF FUNCTION: Independent  PATIENT GOALS: learn how to manage lymphedema on her own and learn what to do and what not to do  OBJECTIVE: Note: Objective measures were completed at Evaluation unless otherwise noted.  COGNITION: Overall cognitive status: Within functional limits for tasks assessed   PALPATION:   OBSERVATIONS / OTHER ASSESSMENTS:   POSTURE: rounded shoulders    UPPER EXTREMITY AROM/PROM:  A/PROM RIGHT   eval   Shoulder extension 75  Shoulder flexion 150  Shoulder abduction 156  Shoulder internal rotation 36  Shoulder external rotation 86    (Blank rows = not tested)  A/PROM LEFT   eval 03/21/24 04/03/24  Shoulder extension 55    Shoulder flexion 115 119 135  Shoulder abduction 112 112 126  Shoulder internal rotation 42    Shoulder external rotation 89      (Blank rows = not tested)  CERVICAL AROM: All within normal limits:   LYMPHEDEMA ASSESSMENTS:   LANDMARK RIGHT  eval  At axilla  31.5  15 cm proximal to olecranon process 28.2  10 cm proximal to olecranon process 27.5  Olecranon process 26  15 cm proximal to ulnar styloid process 24.1  10 cm proximal to ulnar styloid process 20  Just proximal to ulnar styloid process 17  Across hand at thumb web space 20  At base of 2nd digit 6.4  (Blank rows = not tested)  LANDMARK LEFT  eval  At axilla  31  15 cm proximal to olecranon process 29  10 cm proximal to olecranon process 29  Olecranon process 26.7  15 cm proximal to ulnar styloid process 24  10 cm proximal to ulnar styloid process 19.4  Just proximal to ulnar styloid process 17.1  Across hand at thumb web space 19.8  At base of 2nd digit 6.5  (Blank rows = not tested)   L-DEX LYMPHEDEMA SCREENING: The patient was assessed using the L-Dex machine today to produce a lymphedema index baseline score. The patient will be reassessed on a regular basis (typically every 3 months) to obtain new L-Dex scores. If the score is > 6.5 points away from  his/her baseline score indicating onset of subclinical lymphedema, it will be recommended to wear a compression garment for 4 weeks, 12 hours per day and then be reassessed. If the score continues to be > 6.5 points from baseline at reassessment, we will initiate lymphedema treatment. Assessing in this manner has a 95% rate of preventing clinically significant lymphedema.     Flowsheet Row Outpatient Rehab from 03/09/2024 in [facility]  Lymphedema Life Impact Scale Total Score 14.71 %    TODAY'S TREATMENT:                                                                                                                                         04/03/24: Therapeutic Exercise Roll yellow ball up wall into flex x 10 and Lt abd x 10  Supine over full foam roll: alternating flexion, bilateral scaption and bilateral abduction (snow angel) x  10 reps each with pt reporting no pain and good stretch throughout Manual Therapy P/ROM to Lt shoulder into flex, scaption and er/IR with gentle rocking and while performing STM to pec to help decrease tightness  03/28/24: Therapeutic Exercise Pulleys into flex and abd x 2 mins each returning therapist demo and VC's throughout with demo to decrease Lt scapular compensations Roll yellow ball up wall into flex x 10 and Lt abd x 10 with 1# added to Rt wrist, pt reports liking challenge for this Doorway Stretch for Rt UE pectoralis stretch with arm in horz abd x 5 reps, 10 sec holds Therapeutic Activities Supine Scapular Series with yellow theraband returning therapist demo and x 10 each, handout issued Manual Therapy P/ROM to Lt shoulder into flex, scaption and er/IR with scapular depression by therapist throughout, end motions limited by pain STM in supine to Lt pect tendon which is still palpably very tight but pt reports this feeling looser since starting PT.  MLD to Lt UE: Short neck, 5 diaphragmatic breaths, Lt inguinal and Rt axillary  nodes, Lt axillo-inguinal and anterior inter-axillary anastomosis, then focused on Lt upper arm (lateral, medial to lateral, and then lateral again). Began instructing pt in basics of anatomy of the lymphatic system and principles of MLD.   03/26/24: Therapeutic Exercise Pulleys into flex and abd x 2 mins each returning therapist demo and VC's throughout with demo to decrease Lt scapular compensations Roll yellow ball up wall into flex x 10 and Lt abd x 10 with VC's to decrease Lt scapular compensation during exs Modified downward dog on wall x 5 reps, 5 sec holds returning therapist demo  Therapeutic Activities Supine over half foam roll for following: Bil horz abd x 10, bil UE scaption into a V x 10 reps, and then UE snow angel x 10 reps with 5 sec holds, Lt UE pro/ret x 10 Manual Therapy P/ROM to Lt shoulder into flex, scaption and er/IR with scapular depression by therapist throughout, end motions limited by pain STM in supine to Lt pect tendon which is still palpably very tight but pt reports this feeling looser since starting PT; also to Lt chest wall at tightness across chest to incision MLD to Lt UE: Short neck, 5 diaphragmatic breaths, Lt inguinal and Rt axillary nodes, Lt axillo-inguinal and anterior inter-axillary anastomosis, then focused on Lt upper arm (lateral, medial to lateral, and then lateral again). Began instructing pt in basics of anatomy of the lymphatic system and principles of MLD.    03/21/24: Therapeutic Exercise Pulleys into flex and abd x 2 mins each returning therapist demo and VC's throughout with demo to decrease Lt scapular compensations Roll yellow ball up wall into flex x 10 and Lt abd x 5 Doorway Pectoralis Stretch x 4 reps, 20 sec holds and tactile cues to decrease compensations due to tight Lt shoulder Therapeutic Activities Supine over half foam roll for following: Bil horz abd prolonged stretch x 60, bil UE scaption into a V x 10 reps, and then UE snow  angel x 10 reps, pro/ret x 5 Manual Therapy P/ROM to Lt shoulder into flex, scaption and er with scapular depression by therapist throughout  STM in supine to Lt pect tendon and then in Rt S/L to medial scapula border and Lt UT  03/19/24: Therapeutic Exercise Pulleys into flex and abd x 2 mins each returning therapist demo and VC's throughout with demo to decrease Lt scapular compensations Roll yellow ball up wall into flex x 10  and Lt abd x 5, tactile during abd for correct UE position Modified downward dog on wall x 5 reps, 5 sec holds returning therapist demo Doorway Pectoralis Stretch x 4 reps, 20 sec holds and tactile cues to decrease compensations due to tight Lt shoulder Therapeutic Activities Supine over half foam roll for following: Bil horz abd but slightly lower than horz due to post shoulder pain initially, this was gone with lowered UE position x 5 reps; bil UE scaption into a V x 7 reps, and then il UE snow angel x 5 reps, 5 sec holds returning demo for each. Manual Therapy P/ROM to Lt shoulder into flex, scaption and er with scapular depression by therapist throughout  STM in supine to Lt pect tendon and then in Rt S/L to medial scapula border and Lt UT Scap Mobs into protraction and retraction when in Rt S/L    PATIENT EDUCATION:  Education details: Supine scapular series with yellow theraband  Person educated: Patient Education method: Programmer, multimedia, Demonstration, Verbal cues, and Handouts Education comprehension: verbalized understanding and needs further education, tactile and VC's during  HOME EXERCISE PROGRAM: Post op exercises Wear compression sleeve 12 hours/day during waking hours Supine scapular series with yellow theraband  ASSESSMENT:  CLINICAL IMPRESSION: Remeasured ROM today and her shoulder ROM has improved since 8/20. Continued with AAROM and AROM exercises today. Focused on PROM with stretch at end range and by end of session pt had improved L  shoulder ROM with decrease tightness.   OBJECTIVE IMPAIRMENTS: decreased activity tolerance, decreased knowledge of condition, decreased knowledge of use of DME, decreased ROM, increased edema, increased fascial restrictions, impaired UE functional use, and postural dysfunction.   ACTIVITY LIMITATIONS: carrying, lifting, and reach over head  PARTICIPATION LIMITATIONS: none  PERSONAL FACTORS: Age and 1 comorbidity: diabetes, radiation to axilla are also affecting patient's functional outcome.   REHAB POTENTIAL: Excellent  CLINICAL DECISION MAKING: Stable/uncomplicated  EVALUATION COMPLEXITY: Low  GOALS: Goals reviewed with patient? Yes  SHORT TERM GOALS=LTGs: Target date: 04/06/24  Pt will improve Lt shoulder flexion and abduction to equal to the opposite side to demonstrate improved reach  Baseline: Goal status: INITIAL  2.  Pt will be ind with a home exercise program for continued stretching and strengthening. Baseline:  Goal status: INITIAL  3.  Pt will be independent in self MLD for long term management of lymphedema.  Baseline:  Goal status: INITIAL  4. Pt will be educated on lymphedema surveillance, presentations and risk reduction  Goal Status: INITIAL  5. Pt will return to the green on ldex screening to demonstrate reversal of subclinical lymphedema.  Goal status: INITIAL  PLAN:  PT FREQUENCY: 2x/week  PT DURATION: 4 weeks  PLANNED INTERVENTIONS: [id number]- PT Re-evaluation, 97110-Therapeutic exercises, 97530- Therapeutic activity, 97112- Neuromuscular re-education, 97535- Self Care, [id number]- Manual therapy, [phone]- Orthotic Initial, [phone]- Orthotic/Prosthetic subsequent, Patient/Family education, Balance training, Therapeutic exercises, and Self Care  PLAN FOR NEXT SESSION: Assess goals and update POC, Cont Lt shoulder AAROM, AROM, PROM and scap mobility, STM to tight Lt pect insertion, review self MLD for Lt UE, re assess sozo around 04/06/24    Florina Lanis Carbon,  PT 04/03/2024, 12:01 PM   Over Head Pull: Narrow and Wide Grip   Cancer Rehab [phone]   On back, knees bent, feet flat, band across thighs, elbows straight but relaxed. Pull hands apart (start). Keeping elbows straight, bring arms up and over head, hands toward floor. Keep pull steady on band. Hold momentarily. Return slowly, keeping  pull steady, back to start. Then do same with a wider grip on the band (past shoulder width) Repeat _5-10__ times. Band color __yellow____   Side Pull: Double Arm   On back, knees bent, feet flat. Arms perpendicular to body, shoulder level, elbows straight but relaxed. Pull arms out to sides, elbows straight. Resistance band comes across collarbones, hands toward floor. Hold momentarily. Slowly return to starting position. Repeat _5-10__ times. Band color _yellow____   Sword   On back, knees bent, feet flat, left hand on left hip, right hand above left. Pull right arm DIAGONALLY (hip to shoulder) across chest. Bring right arm along head toward floor. Hold momentarily. Slowly return to starting position. Repeat _5-10__ times. Do with left arm. Band color _yellow_____   Shoulder Rotation: Double Arm   On back, knees bent, feet flat, elbows tucked at sides, bent 90, hands palms up. Pull hands apart and down toward floor, keeping elbows near sides. Hold momentarily. Slowly return to starting position. Repeat _5-10__ times. Band color __yellow____

## 2024-04-04 ENCOUNTER — Encounter: Payer: Self-pay | Admitting: Adult Health

## 2024-04-05 ENCOUNTER — Ambulatory Visit

## 2024-04-05 DIAGNOSIS — Z170 Estrogen receptor positive status [ER+]: Secondary | ICD-10-CM

## 2024-04-05 DIAGNOSIS — Z9189 Other specified personal risk factors, not elsewhere classified: Secondary | ICD-10-CM

## 2024-04-05 DIAGNOSIS — C50412 Malignant neoplasm of upper-outer quadrant of left female breast: Secondary | ICD-10-CM | POA: Diagnosis not present

## 2024-04-05 DIAGNOSIS — M25612 Stiffness of left shoulder, not elsewhere classified: Secondary | ICD-10-CM | POA: Diagnosis not present

## 2024-04-05 DIAGNOSIS — I972 Postmastectomy lymphedema syndrome: Secondary | ICD-10-CM | POA: Diagnosis not present

## 2024-04-05 NOTE — Therapy (Addendum)
 OUTPATIENT PHYSICAL THERAPY  UPPER EXTREMITY ONCOLOGY TREATMENT  Patient Name: Melanie Welch MRN: [id number] DOB:[birth year], 78 y.o., female Today's Date: 04/05/2024  END OF SESSION:  PT End of Session - 04/05/24 1008     Visit Number 8    Number of Visits 17    Date for PT Re-Evaluation 05/03/24    Authorization Type 1 re-eval and 9 visits 03/09/24-04/06/24; new re auth sent 04/05/24    Authorization - Visit Number 8    Authorization - Number of Visits 9    Progress Note Due on Visit 10    PT Start Time 1002    PT Stop Time 1058    PT Time Calculation (min) 56 min    Activity Tolerance Patient tolerated treatment well    Behavior During Therapy WFL for tasks assessed/performed             Past Medical History:  Diagnosis Date   Breast cancer (HCC) 03/30/2023   Colonic adenoma 12/2018   ONE   Diabetes mellitus    Fatty liver    Hypertension    MGUS (monoclonal gammopathy of unknown significance)    Personal history of chemotherapy    Personal history of radiation therapy    Port-A-Cath in place 07/05/2023   Proteinuria    Past Surgical History:  Procedure Laterality Date   ABDOMINAL HYSTERECTOMY     AXILLARY LYMPH NODE DISSECTION Left 06/10/2023   Procedure: COMPLETION LEFT AXILLARY LYMPH NODE DISSECTION;  Surgeon: Curvin Deward MOULD, MD;  Location: [facility];  Service: General;  Laterality: Left;   BREAST BIOPSY  2005   left   BREAST BIOPSY Left 03/30/2023   US  LT BREAST BX W LOC DEV EA ADD LESION IMG BX SPEC US  GUIDE 03/30/2023 [facility]   BREAST BIOPSY Left 03/30/2023   MM LT BREAST BX W LOC DEV 1ST LESION IMAGE BX SPEC STEREO GUIDE 03/30/2023 [facility]   BREAST BIOPSY Left 03/30/2023   US  LT BREAST BX W LOC DEV 1ST LESION IMG BX SPEC US  GUIDE 03/30/2023 [facility]   BREAST BIOPSY Left 05/05/2023   US  LT RADIOACTIVE SEED LOC 05/05/2023 [facility]   CATARACT EXTRACTION W/PHACO Right 05/10/2017   Procedure: CATARACT  EXTRACTION PHACO AND INTRAOCULAR LENS PLACEMENT (IOC);  Surgeon: Roz Anes, MD;  Location: [facility];  Service: Ophthalmology;  Laterality: Right;  CDE: 5.04   CATARACT EXTRACTION W/PHACO Left 05/24/2017   Procedure: CATARACT EXTRACTION PHACO AND INTRAOCULAR LENS PLACEMENT (IOC);  Surgeon: Roz Anes, MD;  Location: [facility];  Service: Ophthalmology;  Laterality: Left;  CDE: 8.40   COLONOSCOPY  07/13/2011   Procedure: COLONOSCOPY;  Surgeon: Margo CHRISTELLA Haddock, MD;  Location: [facility];  Service: Endoscopy;  Laterality: N/A;  9:30 AM   COLONOSCOPY N/A 12/09/2018   diverticulosis, suspect diverticulum in mid sigmoid s/p epi, unable to place clip. 5 mm benign polyp. Colonic lipoma. Normal TI. Many diverticula occluded with stool.    MASTECTOMY Left 2024   NODE DISSECTION Left 05/06/2023   Procedure: TARGETED NODE DISSECTION;  Surgeon: Curvin Deward MOULD, MD;  Location: [facility];  Service: General;  Laterality: Left;   POLYPECTOMY  12/09/2018   Procedure: POLYPECTOMY;  Surgeon: Shaaron Lamar CHRISTELLA, MD;  Location: [facility];  Service: Endoscopy;;  splenic flexure   PORT-A-CATH REMOVAL N/A 02/22/2024   Procedure: REMOVAL PORT-A-CATH;  Surgeon: Curvin Deward MOULD, MD;  Location: [facility];  Service: General;  Laterality: N/A;   PORTACATH PLACEMENT N/A  06/10/2023   Procedure: INSERTION PORT-A-CATH WITH ULTRASOUND GUIDANCE;  Surgeon: Curvin Deward MOULD, MD;  Location: [facility];  Service: General;  Laterality: N/A;   SIMPLE MASTECTOMY WITH AXILLARY SENTINEL NODE BIOPSY Left 05/06/2023   Procedure: LEFT MASTECTOMY AND SENTINEL NODE BIOPSY;  Surgeon: Curvin Deward MOULD, MD;  Location: [facility] OR;  Service: General;  Laterality: Left;   Patient Active Problem List   Diagnosis Date Noted   Genetic testing 03/16/2024   Cancer of left female breast (HCC) 05/06/2023   Malignant neoplasm of upper-outer quadrant of left breast in female, estrogen receptor positive (HCC) 04/07/2023   Loss of weight  12/09/2021   Loose stools 10/03/2020   Fatty liver 07/18/2019   GIB (gastrointestinal bleeding) 12/10/2018   Lower GI bleed 12/08/2018   Nontoxic multinodular goiter 05/22/2015   Diabetes mellitus without complication (HCC) 05/22/2015   Essential hypertension, benign 05/22/2015   Palpable thyroid  08/29/2014   MGUS (monoclonal gammopathy of unknown significance) 08/28/2014    PCP: Norleen General, MD  REFERRING PROVIDER: Crawford Morna Pickle, NP   REFERRING DIAG:  [phone] (ICD-10-CM) - Malignant neoplasm of upper-outer quadrant of left breast in female, estrogen receptor positive (HCC) I89.0 (ICD-10-CM) - Lymphedema  THERAPY DIAG:  1. Postmastectomy lymphedema   2. Stiffness of left shoulder, not elsewhere classified   3. Malignant neoplasm of upper-outer quadrant of left breast in female, estrogen receptor positive (HCC)    ONSET DATE: 01/01/24  Rationale for Evaluation and Treatment: Rehabilitation  SUBJECTIVE:                                                                                                                                                                                           SUBJECTIVE STATEMENT: I've been wearing my compression sleeve every day but I didn't do the self MLD over the weekend. I've been doing the self MLD every day mostly but I don't do the whole thing.   PERTINENT HISTORY: Left breast cancer, ER positive ILC with positive node on biopsy. 05/06/23: Lt mastectomy 05/06/23 with 3/4 nodes positive. 06/10/23: ALND 0/1 nodes. Completed chemotherapy with Taxotere  and cytoxan , and radiation. Other hx includes: kidney disease, DM, liver disease.   PAIN:  Are you having pain?No, been feeling better since stretching  PRECAUTIONS: lymphedema risk Left side   RED FLAGS: None   WEIGHT BEARING RESTRICTIONS: No  FALLS:  Has patient fallen in last 6 months? No  LIVING ENVIRONMENT: Lives with: lives alone her daughter stays with her for a few  days Lives in a house Stairs: No Medical equipment: None  OCCUPATION: Retired  LEISURE: Gardening  HAND DOMINANCE: right  PRIOR LEVEL OF FUNCTION: Independent  PATIENT GOALS: learn how to manage lymphedema on her own and learn what to do and what not to do   OBJECTIVE: Note: Objective measures were completed at Evaluation unless otherwise noted.  COGNITION: Overall cognitive status: Within functional limits for tasks assessed   PALPATION:   OBSERVATIONS / OTHER ASSESSMENTS:   POSTURE: rounded shoulders    UPPER EXTREMITY AROM/PROM:  A/PROM RIGHT   eval   Shoulder extension 75  Shoulder flexion 150  Shoulder abduction 156  Shoulder internal rotation 36  Shoulder external rotation 86    (Blank rows = not tested)  A/PROM LEFT   eval 03/21/24 04/03/24  Shoulder extension 55    Shoulder flexion 115 119 135  Shoulder abduction 112 112 126  Shoulder internal rotation 42    Shoulder external rotation 89      (Blank rows = not tested)  CERVICAL AROM: All within normal limits:   LYMPHEDEMA ASSESSMENTS:   LANDMARK RIGHT  eval  At axilla  31.5  15 cm proximal to olecranon process 28.2  10 cm proximal to olecranon process 27.5  Olecranon process 26  15 cm proximal to ulnar styloid process 24.1  10 cm proximal to ulnar styloid process 20  Just proximal to ulnar styloid process 17  Across hand at thumb web space 20  At base of 2nd digit 6.4  (Blank rows = not tested)  LANDMARK LEFT  eval  At axilla  31  15 cm proximal to olecranon process 29  10 cm proximal to olecranon process 29  Olecranon process 26.7  15 cm proximal to ulnar styloid process 24  10 cm proximal to ulnar styloid process 19.4  Just proximal to ulnar styloid process 17.1  Across hand at thumb web space 19.8  At base of 2nd digit 6.5  (Blank rows = not tested)   L-DEX LYMPHEDEMA SCREENING: The patient was assessed using the L-Dex machine today to produce a lymphedema index baseline  score. The patient will be reassessed on a regular basis (typically every 3 months) to obtain new L-Dex scores. If the score is > 6.5 points away from his/her baseline score indicating onset of subclinical lymphedema, it will be recommended to wear a compression garment for 4 weeks, 12 hours per day and then be reassessed. If the score continues to be > 6.5 points from baseline at reassessment, we will initiate lymphedema treatment. Assessing in this manner has a 95% rate of preventing clinically significant lymphedema.  L-DEX LYMPHEDEMA SCREENING Measurement Type: Unilateral L-DEX MEASUREMENT EXTREMITY: Upper Extremity POSITION : Standing DOMINANT SIDE: Right At Risk Side: Right BASELINE SCORE (UNILATERAL): -6.5 L-DEX SCORE (UNILATERAL): 4.8 VALUE CHANGE (UNILAT): 11.3 Comment: end of session after MLD  Flowsheet Row Outpatient Rehab from 03/09/2024 in [facility]  Lymphedema Life Impact Scale Total Score 14.71 %    TODAY'S TREATMENT:  04/05/24: SOZO redone today. Did this before and then again after MLD at end of session. Pt in red both times but this did decrease after MLD.  Self Care Pt was encouraged after elevated SOZO to be adamant about daily self MLD and performing full sequence. Spent time reviewing importance of this in regards to the anatomy of the lymphatic system and answered her questions.  Manual Therapy MLD to Lt UE: Short neck, superficial and deep abdominals, Rt axillary and pectoral nodes, anterior intact thorax sequence, anterior inter-axillary anastomosis, Lt inguinal nodes, Lt axillo-inguinal and anterior inter-axillary anastomosis, then focused on Lt upper arm (lateral, medial to lateral, and then lateral again), forearm (both sides), and dorsum hand and fingers then retracing all steps and reviewing sequence with  pt while performing.  P/ROM during MLD into Lt shoulder flex, scaption and D2 with scapular depression throughout STM to tight Lt pect insertion during P/ROM  04/03/24: Therapeutic Exercise Roll yellow ball up wall into flex x 10 and Lt abd x 10  Supine over full foam roll: alternating flexion, bilateral scaption and bilateral abduction (snow angel) x 10 reps each with pt reporting no pain and good stretch throughout Manual Therapy P/ROM to Lt shoulder into flex, scaption and er/IR with gentle rocking and while performing STM to pec to help decrease tightness  03/28/24: Therapeutic Exercise Pulleys into flex and abd x 2 mins each returning therapist demo and VC's throughout with demo to decrease Lt scapular compensations Roll yellow ball up wall into flex x 10 and Lt abd x 10 with 1# added to Rt wrist, pt reports liking challenge for this Doorway Stretch for Rt UE pectoralis stretch with arm in horz abd x 5 reps, 10 sec holds Therapeutic Activities Supine Scapular Series with yellow theraband returning therapist demo and x 10 each, handout issued Manual Therapy P/ROM to Lt shoulder into flex, scaption and er/IR with scapular depression by therapist throughout, end motions limited by pain STM in supine to Lt pect tendon which is still palpably very tight but pt reports this feeling looser since starting PT.  MLD to Lt UE: Short neck, 5 diaphragmatic breaths, Lt inguinal and Rt axillary nodes, Lt axillo-inguinal and anterior inter-axillary anastomosis, then focused on Lt upper arm (lateral, medial to lateral, and then lateral again). Began instructing pt in basics of anatomy of the lymphatic system and principles of MLD.       PATIENT EDUCATION:  Education details: Supine scapular series with yellow theraband  Person educated: Patient Education method: Explanation, Demonstration, Verbal cues, and Handouts Education comprehension: verbalized understanding and needs further education, tactile  and VC's during  HOME EXERCISE PROGRAM: Post op exercises Wear compression sleeve 12 hours/day during waking hours Supine scapular series with yellow theraband  ASSESSMENT:  CLINICAL IMPRESSION: Insurance reauth sent today for request for more visits and MD renewal. Re did SOZO today as it's been 1 month since her increased change from baseline indicating Stage 0 lymphedema. Her change had increased into the red with a change of 13.1. Discussed wear of compression sleeve and self MLD. Pt reports has been wearing her compression sleeve daily for near 10 hrs/day as instructed, however she had forgotten the importance of daily self MLD performing the full sequence. Reviewed this with her today while performing and she does still have handout at home for reference. Her A/ROM has improved  OBJECTIVE IMPAIRMENTS: decreased activity tolerance, decreased knowledge of condition, decreased knowledge of use of DME, decreased ROM, increased edema, increased fascial  restrictions, impaired UE functional use, and postural dysfunction.   ACTIVITY LIMITATIONS: carrying, lifting, and reach over head  PARTICIPATION LIMITATIONS: none  PERSONAL FACTORS: Age and 1 comorbidity: diabetes, radiation to axilla are also affecting patient's functional outcome.   REHAB POTENTIAL: Excellent  CLINICAL DECISION MAKING: Stable/uncomplicated  EVALUATION COMPLEXITY: Low  GOALS: Goals reviewed with patient? Yes  SHORT TERM GOALS=LTGs: Target date: 04/06/24  Pt will improve Lt shoulder flexion and abduction to equal to the opposite side to demonstrate improved reach  Baseline: Flex 119 and Abd 112; Flex 135 and Abd 126  Goal status: PROGRESSING  2.  Pt will be ind with a home exercise program for continued stretching and strengthening. Baseline:  04/05/24 - independent with initial HEP, will benefit from review of self MLD Goal status: PARTIALLY MET  3.  Pt will be independent in self MLD for long term management of  lymphedema.  Baseline:  Goal status: ONGOING  4. Pt will be educated on lymphedema surveillance, presentations and risk reduction  Goal Status: MET  5. Pt will return to the green on ldex screening to demonstrate reversal of subclinical lymphedema.  Goal status: ONGOING  PLAN:  PT FREQUENCY: 2x/week  PT DURATION: 4 weeks  PLANNED INTERVENTIONS: 97164- PT Re-evaluation, 97110-Therapeutic exercises, 97530- Therapeutic activity, 97112- Neuromuscular re-education, 97535- Self Care, [id number]- Manual therapy, 97760- Orthotic Initial, [phone]- Orthotic/Prosthetic subsequent, Patient/Family education, Balance training, Therapeutic exercises, and Self Care  PLAN FOR NEXT SESSION: Pt needs focus on review of self MLD to work towards independence due to high change from baseline (in red today); may need compression bandaging; Cont Lt shoulder AAROM, AROM, PROM and scap mobility, STM to tight Lt pect insertion, review self MLD for Lt UE, re assess sozo around 04/06/24    Aden Berwyn Caldron, PTA 04/05/2024, 12:07 PM   Over Head Pull: Narrow and Wide Grip   Cancer Rehab [phone]   On back, knees bent, feet flat, band across thighs, elbows straight but relaxed. Pull hands apart (start). Keeping elbows straight, bring arms up and over head, hands toward floor. Keep pull steady on band. Hold momentarily. Return slowly, keeping pull steady, back to start. Then do same with a wider grip on the band (past shoulder width) Repeat _5-10__ times. Band color __yellow____   Side Pull: Double Arm   On back, knees bent, feet flat. Arms perpendicular to body, shoulder level, elbows straight but relaxed. Pull arms out to sides, elbows straight. Resistance band comes across collarbones, hands toward floor. Hold momentarily. Slowly return to starting position. Repeat _5-10__ times. Band color _yellow____   Sword   On back, knees bent, feet flat, left hand on left hip, right hand above left. Pull right arm  DIAGONALLY (hip to shoulder) across chest. Bring right arm along head toward floor. Hold momentarily. Slowly return to starting position. Repeat _5-10__ times. Do with left arm. Band color _yellow_____   Shoulder Rotation: Double Arm   On back, knees bent, feet flat, elbows tucked at sides, bent 90, hands palms up. Pull hands apart and down toward floor, keeping elbows near sides. Hold momentarily. Slowly return to starting position. Repeat _5-10__ times. Band color __yellow____

## 2024-04-05 NOTE — Addendum Note (Signed)
 Addended by: LANIS CARBON, FLORINA L on: 04/05/2024 02:07 PM   Modules accepted: Orders

## 2024-04-10 ENCOUNTER — Ambulatory Visit

## 2024-04-10 DIAGNOSIS — I972 Postmastectomy lymphedema syndrome: Secondary | ICD-10-CM

## 2024-04-10 DIAGNOSIS — M25612 Stiffness of left shoulder, not elsewhere classified: Secondary | ICD-10-CM

## 2024-04-10 DIAGNOSIS — C50412 Malignant neoplasm of upper-outer quadrant of left female breast: Secondary | ICD-10-CM | POA: Diagnosis not present

## 2024-04-10 DIAGNOSIS — Z9189 Other specified personal risk factors, not elsewhere classified: Secondary | ICD-10-CM | POA: Diagnosis not present

## 2024-04-10 DIAGNOSIS — Z170 Estrogen receptor positive status [ER+]: Secondary | ICD-10-CM | POA: Diagnosis not present

## 2024-04-10 NOTE — Therapy (Signed)
 OUTPATIENT PHYSICAL THERAPY  UPPER EXTREMITY ONCOLOGY TREATMENT  Patient Name: Melanie Welch MRN: [id number] DOB:1946-02-27, 78 y.o., female Today's Date: 04/10/2024  END OF SESSION:  PT End of Session - 04/10/24 1202     Visit Number 9    Number of Visits 17    Date for PT Re-Evaluation 05/03/24    Authorization Type 1 re;eval and 8 visits    Authorization Time Period 04/09/2024-05/03/2024    Authorization - Visit Number 1    Authorization - Number of Visits 8    PT Start Time 1204    PT Stop Time 1258    PT Time Calculation (min) 54 min    Activity Tolerance Patient tolerated treatment well    Behavior During Therapy WFL for tasks assessed/performed             Past Medical History:  Diagnosis Date   Breast cancer (HCC) 03/30/2023   Colonic adenoma 12/2018   ONE   Diabetes mellitus    Fatty liver    Hypertension    MGUS (monoclonal gammopathy of unknown significance)    Personal history of chemotherapy    Personal history of radiation therapy    Port-A-Cath in place 07/05/2023   Proteinuria    Past Surgical History:  Procedure Laterality Date   ABDOMINAL HYSTERECTOMY     AXILLARY LYMPH NODE DISSECTION Left 06/10/2023   Procedure: COMPLETION LEFT AXILLARY LYMPH NODE DISSECTION;  Surgeon: Curvin Deward MOULD, MD;  Location: [facility];  Service: General;  Laterality: Left;   BREAST BIOPSY  2005   left   BREAST BIOPSY Left 03/30/2023   US  LT BREAST BX W LOC DEV EA ADD LESION IMG BX SPEC US  GUIDE 03/30/2023 [facility]   BREAST BIOPSY Left 03/30/2023   MM LT BREAST BX W LOC DEV 1ST LESION IMAGE BX SPEC STEREO GUIDE 03/30/2023 [facility]   BREAST BIOPSY Left 03/30/2023   US  LT BREAST BX W LOC DEV 1ST LESION IMG BX SPEC US  GUIDE 03/30/2023 [facility]   BREAST BIOPSY Left 05/05/2023   US  LT RADIOACTIVE SEED LOC 05/05/2023 [facility]   CATARACT EXTRACTION W/PHACO Right 05/10/2017   Procedure: CATARACT EXTRACTION PHACO AND  INTRAOCULAR LENS PLACEMENT (IOC);  Surgeon: Roz Anes, MD;  Location: [facility];  Service: Ophthalmology;  Laterality: Right;  CDE: 5.04   CATARACT EXTRACTION W/PHACO Left 05/24/2017   Procedure: CATARACT EXTRACTION PHACO AND INTRAOCULAR LENS PLACEMENT (IOC);  Surgeon: Roz Anes, MD;  Location: [facility];  Service: Ophthalmology;  Laterality: Left;  CDE: 8.40   COLONOSCOPY  07/13/2011   Procedure: COLONOSCOPY;  Surgeon: Margo CHRISTELLA Haddock, MD;  Location: [facility];  Service: Endoscopy;  Laterality: N/A;  9:30 AM   COLONOSCOPY N/A 12/09/2018   diverticulosis, suspect diverticulum in mid sigmoid s/p epi, unable to place clip. 5 mm benign polyp. Colonic lipoma. Normal TI. Many diverticula occluded with stool.    MASTECTOMY Left 2024   NODE DISSECTION Left 05/06/2023   Procedure: TARGETED NODE DISSECTION;  Surgeon: Curvin Deward MOULD, MD;  Location: [facility];  Service: General;  Laterality: Left;   POLYPECTOMY  12/09/2018   Procedure: POLYPECTOMY;  Surgeon: Shaaron Lamar CHRISTELLA, MD;  Location: [facility];  Service: Endoscopy;;  splenic flexure   PORT-A-CATH REMOVAL N/A 02/22/2024   Procedure: REMOVAL PORT-A-CATH;  Surgeon: Curvin Deward MOULD, MD;  Location: [facility];  Service: General;  Laterality: N/A;   PORTACATH PLACEMENT N/A 06/10/2023   Procedure: INSERTION PORT-A-CATH WITH ULTRASOUND  GUIDANCE;  Surgeon: Curvin Deward MOULD, MD;  Location: [facility];  Service: General;  Laterality: N/A;   SIMPLE MASTECTOMY WITH AXILLARY SENTINEL NODE BIOPSY Left 05/06/2023   Procedure: LEFT MASTECTOMY AND SENTINEL NODE BIOPSY;  Surgeon: Curvin Deward MOULD, MD;  Location: [facility] OR;  Service: General;  Laterality: Left;   Patient Active Problem List   Diagnosis Date Noted   Genetic testing 03/16/2024   Cancer of left female breast (HCC) 05/06/2023   Malignant neoplasm of upper-outer quadrant of left breast in female, estrogen receptor positive (HCC) 04/07/2023   Loss of weight 12/09/2021   Loose  stools 10/03/2020   Fatty liver 07/18/2019   GIB (gastrointestinal bleeding) 12/10/2018   Lower GI bleed 12/08/2018   Nontoxic multinodular goiter 05/22/2015   Diabetes mellitus without complication (HCC) 05/22/2015   Essential hypertension, benign 05/22/2015   Palpable thyroid  08/29/2014   MGUS (monoclonal gammopathy of unknown significance) 08/28/2014    PCP: Norleen General, MD  REFERRING PROVIDER: Crawford Morna Pickle, NP   REFERRING DIAG:  [phone] (ICD-10-CM) - Malignant neoplasm of upper-outer quadrant of left breast in female, estrogen receptor positive (HCC) I89.0 (ICD-10-CM) - Lymphedema  THERAPY DIAG:  1. Postmastectomy lymphedema   2. Stiffness of left shoulder, not elsewhere classified   3. Malignant neoplasm of upper-outer quadrant of left breast in female, estrogen receptor positive (HCC)    ONSET DATE: 01/01/24  Rationale for Evaluation and Treatment: Rehabilitation  SUBJECTIVE:                                                                                                                                                                                           SUBJECTIVE STATEMENT:  I have done the MLD several times since I was here last and I wash my sleeve every 3 days.  PERTINENT HISTORY: Left breast cancer, ER positive ILC with positive node on biopsy. 05/06/23: Lt mastectomy 05/06/23 with 3/4 nodes positive. 06/10/23: ALND 0/1 nodes. Completed chemotherapy with Taxotere  and cytoxan , and radiation. Other hx includes: kidney disease, DM, liver disease.   PAIN:  Are you having pain?No, been feeling better since stretching  PRECAUTIONS: lymphedema risk Left side   RED FLAGS: None   WEIGHT BEARING RESTRICTIONS: No  FALLS:  Has patient fallen in last 6 months? No  LIVING ENVIRONMENT: Lives with: lives alone her daughter stays with her for a few days Lives in a house Stairs: No Medical equipment: None  OCCUPATION: Retired  LEISURE:  Gardening  HAND DOMINANCE: right   PRIOR LEVEL OF FUNCTION: Independent  PATIENT GOALS: learn how to manage lymphedema on her own and learn what to do  and what not to do   OBJECTIVE: Note: Objective measures were completed at Evaluation unless otherwise noted.  COGNITION: Overall cognitive status: Within functional limits for tasks assessed   PALPATION:   OBSERVATIONS / OTHER ASSESSMENTS:   POSTURE: rounded shoulders    UPPER EXTREMITY AROM/PROM:  A/PROM RIGHT   eval   Shoulder extension 75  Shoulder flexion 150  Shoulder abduction 156  Shoulder internal rotation 36  Shoulder external rotation 86    (Blank rows = not tested)  A/PROM LEFT   eval 03/21/24 04/03/24  Shoulder extension 55    Shoulder flexion 115 119 135  Shoulder abduction 112 112 126  Shoulder internal rotation 42    Shoulder external rotation 89      (Blank rows = not tested)  CERVICAL AROM: All within normal limits:   LYMPHEDEMA ASSESSMENTS:   LANDMARK RIGHT  eval  At axilla  31.5  15 cm proximal to olecranon process 28.2  10 cm proximal to olecranon process 27.5  Olecranon process 26  15 cm proximal to ulnar styloid process 24.1  10 cm proximal to ulnar styloid process 20  Just proximal to ulnar styloid process 17  Across hand at thumb web space 20  At base of 2nd digit 6.4  (Blank rows = not tested)  LANDMARK LEFT  eval  At axilla  31  15 cm proximal to olecranon process 29  10 cm proximal to olecranon process 29  Olecranon process 26.7  15 cm proximal to ulnar styloid process 24  10 cm proximal to ulnar styloid process 19.4  Just proximal to ulnar styloid process 17.1  Across hand at thumb web space 19.8  At base of 2nd digit 6.5  (Blank rows = not tested)   L-DEX LYMPHEDEMA SCREENING: The patient was assessed using the L-Dex machine today to produce a lymphedema index baseline score. The patient will be reassessed on a regular basis (typically every 3 months) to obtain new  L-Dex scores. If the score is > 6.5 points away from his/her baseline score indicating onset of subclinical lymphedema, it will be recommended to wear a compression garment for 4 weeks, 12 hours per day and then be reassessed. If the score continues to be > 6.5 points from baseline at reassessment, we will initiate lymphedema treatment. Assessing in this manner has a 95% rate of preventing clinically significant lymphedema.     Flowsheet Row Outpatient Rehab from 03/09/2024 in [facility]  Lymphedema Life Impact Scale Total Score 14.71 %    TODAY'S TREATMENT: 04/09/2024 Discussed laundering sleeve every 2 days if stretched out so it holds better Supine half foam roll;alternating shoulder flexion x 10, scaption B x 5, horizontal abduction x 5, snow angels x 5 MLD to Lt UE: Short neck, superficial and deep abdominals, Rt axillary and pectoral nodes,  anterior inter-axillary anastomosis, Lt inguinal nodes, Lt axillo-inguinal and anterior inter-axillary anastomosis, then focused on Lt upper arm (lateral, medial to lateral, and then lateral again), forearm (both sides), and dorsum hand and fingers then retracing all steps and  P/ROM during MLD into Lt shoulder flex, scaption and D2 with scapular depression throughout  04/05/24: SOZO redone today. Did this before and then again after MLD at end of session. Pt in red both times but this did decrease after MLD.  Self Care Pt was encouraged after elevated SOZO to be adamant about daily self MLD and performing full sequence. Spent time reviewing importance of this in regards to the anatomy of the lymphatic system and answered her questions.  Manual Therapy MLD to Lt UE: Short neck, superficial and deep abdominals, Rt axillary and pectoral nodes, anterior intact thorax sequence, anterior inter-axillary  anastomosis, Lt inguinal nodes, Lt axillo-inguinal and anterior inter-axillary anastomosis, then focused on Lt upper arm (lateral, medial to lateral, and then lateral again), forearm (both sides), and dorsum hand and fingers then retracing all steps and reviewing sequence with pt while performing.  P/ROM during MLD into Lt shoulder flex, scaption and D2 with scapular depression throughout STM to tight Lt pect insertion during P/ROM  04/03/24: Therapeutic Exercise Roll yellow ball up wall into flex x 10 and Lt abd x 10  Supine over full foam roll: alternating flexion, bilateral scaption and bilateral abduction (snow angel) x 10 reps each with pt reporting no pain and good stretch throughout Manual Therapy P/ROM to Lt shoulder into flex, scaption and er/IR with gentle rocking and while performing STM to pec to help decrease tightness  03/28/24: Therapeutic Exercise Pulleys into flex and abd x 2 mins each returning therapist demo and VC's throughout with demo to decrease Lt scapular compensations Roll yellow ball up wall into flex x 10 and Lt abd x 10 with 1# added to Rt wrist, pt reports liking challenge for this Doorway Stretch for Rt UE pectoralis stretch with arm in horz abd x 5 reps, 10 sec holds Therapeutic Activities Supine Scapular Series with yellow theraband returning therapist demo and x 10 each, handout issued Manual Therapy P/ROM to Lt shoulder into flex, scaption and er/IR with scapular depression by therapist throughout, end motions limited by pain STM in supine to Lt pect tendon which is still palpably very tight but pt reports this feeling looser since starting PT.  MLD to Lt UE: Short neck, 5 diaphragmatic breaths, Lt inguinal and Rt axillary nodes, Lt axillo-inguinal and anterior inter-axillary anastomosis, then focused on Lt upper arm (lateral, medial to lateral, and then lateral again). Began instructing pt in basics of anatomy of the lymphatic system and principles of MLD.        PATIENT EDUCATION:  Education details: Supine scapular series with yellow theraband  Person educated: Patient Education method: Explanation, Demonstration, Verbal cues, and Handouts Education comprehension: verbalized understanding and needs further education, tactile and VC's during  HOME EXERCISE PROGRAM: Post op exercises Wear compression sleeve 12 hours/day during waking hours Supine scapular series with yellow theraband  ASSESSMENT:  CLINICAL IMPRESSION: Pt has resumed doing her MLD on a regular basis. We discussed sleeve laundering slightly more often to have better elasticity. Pt did well with exercises and felt good stretch.  OBJECTIVE IMPAIRMENTS: decreased activity tolerance, decreased knowledge of condition, decreased knowledge of use of DME, decreased ROM, increased edema, increased fascial restrictions, impaired UE functional use, and postural dysfunction.   ACTIVITY LIMITATIONS: carrying, lifting, and reach over head  PARTICIPATION LIMITATIONS: none  PERSONAL FACTORS: Age and 1 comorbidity: diabetes, radiation to axilla are also affecting patient's functional outcome.   REHAB POTENTIAL: Excellent  CLINICAL DECISION MAKING: Stable/uncomplicated  EVALUATION COMPLEXITY: Low  GOALS: Goals reviewed with patient? Yes  SHORT TERM GOALS=LTGs: Target date: 04/06/24  Pt will improve  Lt shoulder flexion and abduction to equal to the opposite side to demonstrate improved reach  Baseline: Flex 119 and Abd 112; Flex 135 and Abd 126  Goal status: PROGRESSING  2.  Pt will be ind with a home exercise program for continued stretching and strengthening. Baseline:  04/05/24 - independent with initial HEP, will benefit from review of self MLD Goal status: PARTIALLY MET  3.  Pt will be independent in self MLD for long term management of lymphedema.  Baseline:  Goal status: ONGOING  4. Pt will be educated on lymphedema surveillance, presentations and risk  reduction  Goal Status: MET  5. Pt will return to the green on ldex screening to demonstrate reversal of subclinical lymphedema.  Goal status: ONGOING  PLAN:  PT FREQUENCY: 2x/week  PT DURATION: 4 weeks  PLANNED INTERVENTIONS: [id number]- PT Re-evaluation, 97110-Therapeutic exercises, 97530- Therapeutic activity, 97112- Neuromuscular re-education, 97535- Self Care, [id number]- Manual therapy, 97760- Orthotic Initial, [phone]- Orthotic/Prosthetic subsequent, Patient/Family education, Balance training, Therapeutic exercises, and Self Care  PLAN FOR NEXT SESSION: Pt needs focus on review of self MLD to work towards independence due to high change from baseline (in red today); may need compression bandaging; Cont Lt shoulder AAROM, AROM, PROM and scap mobility, STM to tight Lt pect insertion, review self MLD for Lt UE, re assess sozo around 04/06/24    Grayce JINNY Sheldon, PT 04/10/2024, 1:00 PM   Over Head Pull: Narrow and Wide Grip   Cancer Rehab [phone]   On back, knees bent, feet flat, band across thighs, elbows straight but relaxed. Pull hands apart (start). Keeping elbows straight, bring arms up and over head, hands toward floor. Keep pull steady on band. Hold momentarily. Return slowly, keeping pull steady, back to start. Then do same with a wider grip on the band (past shoulder width) Repeat _5-10__ times. Band color __yellow____   Side Pull: Double Arm   On back, knees bent, feet flat. Arms perpendicular to body, shoulder level, elbows straight but relaxed. Pull arms out to sides, elbows straight. Resistance band comes across collarbones, hands toward floor. Hold momentarily. Slowly return to starting position. Repeat _5-10__ times. Band color _yellow____   Sword   On back, knees bent, feet flat, left hand on left hip, right hand above left. Pull right arm DIAGONALLY (hip to shoulder) across chest. Bring right arm along head toward floor. Hold momentarily. Slowly return to starting  position. Repeat _5-10__ times. Do with left arm. Band color _yellow_____   Shoulder Rotation: Double Arm   On back, knees bent, feet flat, elbows tucked at sides, bent 90, hands palms up. Pull hands apart and down toward floor, keeping elbows near sides. Hold momentarily. Slowly return to starting position. Repeat _5-10__ times. Band color __yellow____

## 2024-04-12 ENCOUNTER — Ambulatory Visit

## 2024-04-12 DIAGNOSIS — C50412 Malignant neoplasm of upper-outer quadrant of left female breast: Secondary | ICD-10-CM | POA: Diagnosis not present

## 2024-04-12 DIAGNOSIS — M25612 Stiffness of left shoulder, not elsewhere classified: Secondary | ICD-10-CM | POA: Diagnosis not present

## 2024-04-12 DIAGNOSIS — Z9189 Other specified personal risk factors, not elsewhere classified: Secondary | ICD-10-CM

## 2024-04-12 DIAGNOSIS — I972 Postmastectomy lymphedema syndrome: Secondary | ICD-10-CM

## 2024-04-12 DIAGNOSIS — Z170 Estrogen receptor positive status [ER+]: Secondary | ICD-10-CM | POA: Diagnosis not present

## 2024-04-12 NOTE — Therapy (Signed)
 OUTPATIENT PHYSICAL THERAPY  UPPER EXTREMITY ONCOLOGY TREATMENT  Patient Name: Melanie Welch MRN: [id number] DOB:03-17-1946, 78 y.o., female Today's Date: 04/12/2024  END OF SESSION:  PT End of Session - 04/12/24 1206     Visit Number 10    Number of Visits 17    Date for PT Re-Evaluation 05/03/24    Authorization Type 1 re;eval and 8 visits    Authorization Time Period 04/09/2024-05/03/2024    Authorization - Visit Number 2    Authorization - Number of Visits 8    Progress Note Due on Visit 18    PT Start Time 1204    PT Stop Time 1303    PT Time Calculation (min) 59 min    Activity Tolerance Patient tolerated treatment well    Behavior During Therapy WFL for tasks assessed/performed             Past Medical History:  Diagnosis Date   Breast cancer (HCC) 03/30/2023   Colonic adenoma 12/2018   ONE   Diabetes mellitus    Fatty liver    Hypertension    MGUS (monoclonal gammopathy of unknown significance)    Personal history of chemotherapy    Personal history of radiation therapy    Port-A-Cath in place 07/05/2023   Proteinuria    Past Surgical History:  Procedure Laterality Date   ABDOMINAL HYSTERECTOMY     AXILLARY LYMPH NODE DISSECTION Left 06/10/2023   Procedure: COMPLETION LEFT AXILLARY LYMPH NODE DISSECTION;  Surgeon: Curvin Deward MOULD, MD;  Location: [facility];  Service: General;  Laterality: Left;   BREAST BIOPSY  2005   left   BREAST BIOPSY Left 03/30/2023   US  LT BREAST BX W LOC DEV EA ADD LESION IMG BX SPEC US  GUIDE 03/30/2023 [facility]   BREAST BIOPSY Left 03/30/2023   MM LT BREAST BX W LOC DEV 1ST LESION IMAGE BX SPEC STEREO GUIDE 03/30/2023 [facility]   BREAST BIOPSY Left 03/30/2023   US  LT BREAST BX W LOC DEV 1ST LESION IMG BX SPEC US  GUIDE 03/30/2023 [facility]   BREAST BIOPSY Left 05/05/2023   US  LT RADIOACTIVE SEED LOC 05/05/2023 [facility]   CATARACT EXTRACTION W/PHACO Right 05/10/2017   Procedure:  CATARACT EXTRACTION PHACO AND INTRAOCULAR LENS PLACEMENT (IOC);  Surgeon: Roz Anes, MD;  Location: [facility];  Service: Ophthalmology;  Laterality: Right;  CDE: 5.04   CATARACT EXTRACTION W/PHACO Left 05/24/2017   Procedure: CATARACT EXTRACTION PHACO AND INTRAOCULAR LENS PLACEMENT (IOC);  Surgeon: Roz Anes, MD;  Location: [facility];  Service: Ophthalmology;  Laterality: Left;  CDE: 8.40   COLONOSCOPY  07/13/2011   Procedure: COLONOSCOPY;  Surgeon: Margo CHRISTELLA Haddock, MD;  Location: [facility];  Service: Endoscopy;  Laterality: N/A;  9:30 AM   COLONOSCOPY N/A 12/09/2018   diverticulosis, suspect diverticulum in mid sigmoid s/p epi, unable to place clip. 5 mm benign polyp. Colonic lipoma. Normal TI. Many diverticula occluded with stool.    MASTECTOMY Left 2024   NODE DISSECTION Left 05/06/2023   Procedure: TARGETED NODE DISSECTION;  Surgeon: Curvin Deward MOULD, MD;  Location: [facility];  Service: General;  Laterality: Left;   POLYPECTOMY  12/09/2018   Procedure: POLYPECTOMY;  Surgeon: Shaaron Lamar CHRISTELLA, MD;  Location: [facility];  Service: Endoscopy;;  splenic flexure   PORT-A-CATH REMOVAL N/A 02/22/2024   Procedure: REMOVAL PORT-A-CATH;  Surgeon: Curvin Deward MOULD, MD;  Location: [facility];  Service: General;  Laterality: N/A;   PORTACATH PLACEMENT  N/A 06/10/2023   Procedure: INSERTION PORT-A-CATH WITH ULTRASOUND GUIDANCE;  Surgeon: Curvin Deward MOULD, MD;  Location: [facility];  Service: General;  Laterality: N/A;   SIMPLE MASTECTOMY WITH AXILLARY SENTINEL NODE BIOPSY Left 05/06/2023   Procedure: LEFT MASTECTOMY AND SENTINEL NODE BIOPSY;  Surgeon: Curvin Deward MOULD, MD;  Location: [facility] OR;  Service: General;  Laterality: Left;   Patient Active Problem List   Diagnosis Date Noted   Genetic testing 03/16/2024   Cancer of left female breast (HCC) 05/06/2023   Malignant neoplasm of upper-outer quadrant of left breast in female, estrogen receptor positive (HCC) 04/07/2023   Loss of  weight 12/09/2021   Loose stools 10/03/2020   Fatty liver 07/18/2019   GIB (gastrointestinal bleeding) 12/10/2018   Lower GI bleed 12/08/2018   Nontoxic multinodular goiter 05/22/2015   Diabetes mellitus without complication (HCC) 05/22/2015   Essential hypertension, benign 05/22/2015   Palpable thyroid  08/29/2014   MGUS (monoclonal gammopathy of unknown significance) 08/28/2014    PCP: Norleen General, MD  REFERRING PROVIDER: Crawford Morna Pickle, NP   REFERRING DIAG:  [phone] (ICD-10-CM) - Malignant neoplasm of upper-outer quadrant of left breast in female, estrogen receptor positive (HCC) I89.0 (ICD-10-CM) - Lymphedema  THERAPY DIAG:  1. Postmastectomy lymphedema   2. Stiffness of left shoulder, not elsewhere classified   3. Malignant neoplasm of upper-outer quadrant of left breast in female, estrogen receptor positive (HCC)    ONSET DATE: 01/01/24  Rationale for Evaluation and Treatment: Rehabilitation  SUBJECTIVE:                                                                                                                                                                                           SUBJECTIVE STATEMENT:  I didn't put my sleeve on before I left here last time and now I can't find it. I'll keep looking but I might need to get another one.   PERTINENT HISTORY: Left breast cancer, ER positive ILC with positive node on biopsy. 05/06/23: Lt mastectomy 05/06/23 with 3/4 nodes positive. 06/10/23: ALND 0/1 nodes. Completed chemotherapy with Taxotere  and cytoxan , and radiation. Other hx includes: kidney disease, DM, liver disease.   PAIN:  Are you having pain? No  PRECAUTIONS: lymphedema risk Left side   RED FLAGS: None   WEIGHT BEARING RESTRICTIONS: No  FALLS:  Has patient fallen in last 6 months? No  LIVING ENVIRONMENT: Lives with: lives alone her daughter stays with her for a few days Lives in a house Stairs: No Medical equipment:  None  OCCUPATION: Retired  LEISURE: Gardening  HAND DOMINANCE: right   PRIOR LEVEL OF FUNCTION: Independent  PATIENT GOALS: learn how to manage lymphedema on her own and learn what to do and what not to do   OBJECTIVE: Note: Objective measures were completed at Evaluation unless otherwise noted.  COGNITION: Overall cognitive status: Within functional limits for tasks assessed   PALPATION:   OBSERVATIONS / OTHER ASSESSMENTS:   POSTURE: rounded shoulders    UPPER EXTREMITY AROM/PROM:  A/PROM RIGHT   eval   Shoulder extension 75  Shoulder flexion 150  Shoulder abduction 156  Shoulder internal rotation 36  Shoulder external rotation 86    (Blank rows = not tested)  A/PROM LEFT   eval 03/21/24 04/03/24  Shoulder extension 55    Shoulder flexion 115 119 135  Shoulder abduction 112 112 126  Shoulder internal rotation 42    Shoulder external rotation 89      (Blank rows = not tested)  CERVICAL AROM: All within normal limits:   LYMPHEDEMA ASSESSMENTS:   LANDMARK RIGHT  eval  At axilla  31.5  15 cm proximal to olecranon process 28.2  10 cm proximal to olecranon process 27.5  Olecranon process 26  15 cm proximal to ulnar styloid process 24.1  10 cm proximal to ulnar styloid process 20  Just proximal to ulnar styloid process 17  Across hand at thumb web space 20  At base of 2nd digit 6.4  (Blank rows = not tested)  LANDMARK LEFT  eval  At axilla  31  15 cm proximal to olecranon process 29  10 cm proximal to olecranon process 29  Olecranon process 26.7  15 cm proximal to ulnar styloid process 24  10 cm proximal to ulnar styloid process 19.4  Just proximal to ulnar styloid process 17.1  Across hand at thumb web space 19.8  At base of 2nd digit 6.5  (Blank rows = not tested)   L-DEX LYMPHEDEMA SCREENING: The patient was assessed using the L-Dex machine today to produce a lymphedema index baseline score. The patient will be reassessed on a regular basis  (typically every 3 months) to obtain new L-Dex scores. If the score is > 6.5 points away from his/her baseline score indicating onset of subclinical lymphedema, it will be recommended to wear a compression garment for 4 weeks, 12 hours per day and then be reassessed. If the score continues to be > 6.5 points from baseline at reassessment, we will initiate lymphedema treatment. Assessing in this manner has a 95% rate of preventing clinically significant lymphedema.     Flowsheet Row Outpatient Rehab from 03/09/2024 in [facility]  Lymphedema Life Impact Scale Total Score 14.71 %    TODAY'S TREATMENT: 04/12/24: Therapeutic Exercises  Pulleys into flex x 2 mins, and abd x 1 min Roll yellow ball up wall into flex x 10 and then Lt abd x 5 reviewing technique of each Modified downward dog on wall x 5 reps, 5 sec holds Therapeutic Activities  Supine over half foam roll for following: Bil horz abd, bil UE scaption into a V x 10 each, alternating shoulder flex x 10  Manual Therapy MLD to Lt UE: Short neck, superficial and deep abdominals, Rt axillary and pectoral nodes,  anterior inter-axillary anastomosis, Lt inguinal nodes, Lt axillo-inguinal and anterior inter-axillary anastomosis, then focused on Lt upper arm (lateral, medial to lateral, and then lateral again), forearm (both sides), and dorsum hand and fingers then retracing all steps and  P/ROM during MLD into Lt shoulder flex, scaption and D2 with scapular depression throughout MFR to  cording in axilla during P/ROM Self Care Measured pt for a Sigvaris Secure compression sleeve, size M2 is her fit and she requested beige. Sent order to her email through [employer] and pt verbalizes understanding she can place order at her convenience.    04/09/2024 Discussed laundering sleeve every 2 days if stretched out so it holds better Supine half foam roll;alternating shoulder flexion x 10, scaption B x 5, horizontal abduction x 5,  snow angels x 5 MLD to Lt UE: Short neck, superficial and deep abdominals, Rt axillary and pectoral nodes,  anterior inter-axillary anastomosis, Lt inguinal nodes, Lt axillo-inguinal and anterior inter-axillary anastomosis, then focused on Lt upper arm (lateral, medial to lateral, and then lateral again), forearm (both sides), and dorsum hand and fingers then retracing all steps and  P/ROM during MLD into Lt shoulder flex, scaption and D2 with scapular depression throughout                                                                                                                                          04/05/24: SOZO redone today. Did this before and then again after MLD at end of session. Pt in red both times but this did decrease after MLD.  Self Care Pt was encouraged after elevated SOZO to be adamant about daily self MLD and performing full sequence. Spent time reviewing importance of this in regards to the anatomy of the lymphatic system and answered her questions.  Manual Therapy MLD to Lt UE: Short neck, superficial and deep abdominals, Rt axillary and pectoral nodes, anterior intact thorax sequence, anterior inter-axillary anastomosis, Lt inguinal nodes, Lt axillo-inguinal and anterior inter-axillary anastomosis, then focused on Lt upper arm (lateral, medial to lateral, and then lateral again), forearm (both sides), and dorsum hand and fingers then retracing all steps and reviewing sequence with pt while performing.  P/ROM during MLD into Lt shoulder flex, scaption and D2 with scapular depression throughout STM to tight Lt pect insertion during P/ROM      PATIENT EDUCATION:  Education details: Supine scapular series with yellow theraband  Person educated: Patient Education method: Programmer, multimedia, Demonstration, Verbal cues, and Handouts Education comprehension: verbalized understanding and needs further education, tactile and VC's during  HOME EXERCISE PROGRAM: Post op  exercises Wear compression sleeve 12 hours/day during waking hours Supine scapular series with yellow theraband  ASSESSMENT:  CLINICAL IMPRESSION: Pt reports she has misplaced her sleeve and will cont to look for it when she gets home so she can resume wear and start washing it every 2 days instead of 3 to hopefully revamp the compression. She wanted to go ahead with ordering another one, that way she'll have one to start wearing when this arrives. Continued with AA/ROM for stretching and then MLD.   OBJECTIVE IMPAIRMENTS: decreased activity tolerance, decreased knowledge of condition, decreased knowledge of use of DME, decreased ROM, increased edema, increased fascial restrictions,  impaired UE functional use, and postural dysfunction.   ACTIVITY LIMITATIONS: carrying, lifting, and reach over head  PARTICIPATION LIMITATIONS: none  PERSONAL FACTORS: Age and 1 comorbidity: diabetes, radiation to axilla are also affecting patient's functional outcome.   REHAB POTENTIAL: Excellent  CLINICAL DECISION MAKING: Stable/uncomplicated  EVALUATION COMPLEXITY: Low  GOALS: Goals reviewed with patient? Yes  SHORT TERM GOALS=LTGs: Target date: 04/06/24  Pt will improve Lt shoulder flexion and abduction to equal to the opposite side to demonstrate improved reach  Baseline: Flex 119 and Abd 112; Flex 135 and Abd 126  Goal status: PROGRESSING  2.  Pt will be ind with a home exercise program for continued stretching and strengthening. Baseline:  04/05/24 - independent with initial HEP, will benefit from review of self MLD Goal status: PARTIALLY MET  3.  Pt will be independent in self MLD for long term management of lymphedema.  Baseline:  Goal status: ONGOING  4. Pt will be educated on lymphedema surveillance, presentations and risk reduction  Goal Status: MET  5. Pt will return to the green on ldex screening to demonstrate reversal of subclinical lymphedema.  Goal status: ONGOING  PLAN:  PT  FREQUENCY: 2x/week  PT DURATION: 4 weeks  PLANNED INTERVENTIONS: 97164- PT Re-evaluation, 97110-Therapeutic exercises, 97530- Therapeutic activity, 97112- Neuromuscular re-education, 97535- Self Care, [id number]- Manual therapy, 97760- Orthotic Initial, [phone]- Orthotic/Prosthetic subsequent, Patient/Family education, Balance training, Therapeutic exercises, and Self Care  PLAN FOR NEXT SESSION: Cont with review of self MLD to work towards independence due to high change from baseline; may need compression bandaging, did she order her sleeve? Cont Lt shoulder AAROM, AROM, PROM and scap mobility, STM to tight Lt pect insertion, review self MLD for Lt UE, re assess sozo around 04/06/24    Aden Berwyn Caldron, PTA 04/12/2024, 1:14 PM   Over Head Pull: Narrow and Wide Grip   Cancer Rehab [phone]   On back, knees bent, feet flat, band across thighs, elbows straight but relaxed. Pull hands apart (start). Keeping elbows straight, bring arms up and over head, hands toward floor. Keep pull steady on band. Hold momentarily. Return slowly, keeping pull steady, back to start. Then do same with a wider grip on the band (past shoulder width) Repeat _5-10__ times. Band color __yellow____   Side Pull: Double Arm   On back, knees bent, feet flat. Arms perpendicular to body, shoulder level, elbows straight but relaxed. Pull arms out to sides, elbows straight. Resistance band comes across collarbones, hands toward floor. Hold momentarily. Slowly return to starting position. Repeat _5-10__ times. Band color _yellow____   Sword   On back, knees bent, feet flat, left hand on left hip, right hand above left. Pull right arm DIAGONALLY (hip to shoulder) across chest. Bring right arm along head toward floor. Hold momentarily. Slowly return to starting position. Repeat _5-10__ times. Do with left arm. Band color _yellow_____   Shoulder Rotation: Double Arm   On back, knees bent, feet flat, elbows tucked at  sides, bent 90, hands palms up. Pull hands apart and down toward floor, keeping elbows near sides. Hold momentarily. Slowly return to starting position. Repeat _5-10__ times. Band color __yellow____

## 2024-04-17 ENCOUNTER — Ambulatory Visit

## 2024-04-17 VITALS — Wt 164.2 lb

## 2024-04-17 DIAGNOSIS — I972 Postmastectomy lymphedema syndrome: Secondary | ICD-10-CM

## 2024-04-17 DIAGNOSIS — M25612 Stiffness of left shoulder, not elsewhere classified: Secondary | ICD-10-CM | POA: Diagnosis not present

## 2024-04-17 DIAGNOSIS — Z9189 Other specified personal risk factors, not elsewhere classified: Secondary | ICD-10-CM

## 2024-04-17 DIAGNOSIS — C50412 Malignant neoplasm of upper-outer quadrant of left female breast: Secondary | ICD-10-CM | POA: Diagnosis not present

## 2024-04-17 DIAGNOSIS — Z170 Estrogen receptor positive status [ER+]: Secondary | ICD-10-CM | POA: Diagnosis not present

## 2024-04-17 NOTE — Therapy (Signed)
 OUTPATIENT PHYSICAL THERAPY  UPPER EXTREMITY ONCOLOGY TREATMENT  Patient Name: Melanie Welch MRN: [id number] DOB:13-Jun-1946, 78 y.o., female Today's Date: 04/17/2024  END OF SESSION:  PT End of Session - 04/17/24 1107     Visit Number 11    Number of Visits 17    Date for PT Re-Evaluation 05/03/24    Authorization Type 1 re;eval and 8 visits    Authorization Time Period 04/09/2024-05/03/2024    Authorization - Visit Number 3    Authorization - Number of Visits 8    Progress Note Due on Visit 18    PT Start Time 1103    PT Stop Time 1205    PT Time Calculation (min) 62 min    Activity Tolerance Patient tolerated treatment well    Behavior During Therapy WFL for tasks assessed/performed             Past Medical History:  Diagnosis Date   Breast cancer (HCC) 03/30/2023   Colonic adenoma 12/2018   ONE   Diabetes mellitus    Fatty liver    Hypertension    MGUS (monoclonal gammopathy of unknown significance)    Personal history of chemotherapy    Personal history of radiation therapy    Port-A-Cath in place 07/05/2023   Proteinuria    Past Surgical History:  Procedure Laterality Date   ABDOMINAL HYSTERECTOMY     AXILLARY LYMPH NODE DISSECTION Left 06/10/2023   Procedure: COMPLETION LEFT AXILLARY LYMPH NODE DISSECTION;  Surgeon: Curvin Deward MOULD, MD;  Location: [facility];  Service: General;  Laterality: Left;   BREAST BIOPSY  2005   left   BREAST BIOPSY Left 03/30/2023   US  LT BREAST BX W LOC DEV EA ADD LESION IMG BX SPEC US  GUIDE 03/30/2023 [facility]   BREAST BIOPSY Left 03/30/2023   MM LT BREAST BX W LOC DEV 1ST LESION IMAGE BX SPEC STEREO GUIDE 03/30/2023 [facility]   BREAST BIOPSY Left 03/30/2023   US  LT BREAST BX W LOC DEV 1ST LESION IMG BX SPEC US  GUIDE 03/30/2023 [facility]   BREAST BIOPSY Left 05/05/2023   US  LT RADIOACTIVE SEED LOC 05/05/2023 [facility]   CATARACT EXTRACTION W/PHACO Right 05/10/2017   Procedure:  CATARACT EXTRACTION PHACO AND INTRAOCULAR LENS PLACEMENT (IOC);  Surgeon: Roz Anes, MD;  Location: [facility];  Service: Ophthalmology;  Laterality: Right;  CDE: 5.04   CATARACT EXTRACTION W/PHACO Left 05/24/2017   Procedure: CATARACT EXTRACTION PHACO AND INTRAOCULAR LENS PLACEMENT (IOC);  Surgeon: Roz Anes, MD;  Location: [facility];  Service: Ophthalmology;  Laterality: Left;  CDE: 8.40   COLONOSCOPY  07/13/2011   Procedure: COLONOSCOPY;  Surgeon: Margo CHRISTELLA Haddock, MD;  Location: [facility];  Service: Endoscopy;  Laterality: N/A;  9:30 AM   COLONOSCOPY N/A 12/09/2018   diverticulosis, suspect diverticulum in mid sigmoid s/p epi, unable to place clip. 5 mm benign polyp. Colonic lipoma. Normal TI. Many diverticula occluded with stool.    MASTECTOMY Left 2024   NODE DISSECTION Left 05/06/2023   Procedure: TARGETED NODE DISSECTION;  Surgeon: Curvin Deward MOULD, MD;  Location: [facility];  Service: General;  Laterality: Left;   POLYPECTOMY  12/09/2018   Procedure: POLYPECTOMY;  Surgeon: Shaaron Lamar CHRISTELLA, MD;  Location: [facility];  Service: Endoscopy;;  splenic flexure   PORT-A-CATH REMOVAL N/A 02/22/2024   Procedure: REMOVAL PORT-A-CATH;  Surgeon: Curvin Deward MOULD, MD;  Location: [facility];  Service: General;  Laterality: N/A;   PORTACATH PLACEMENT  N/A 06/10/2023   Procedure: INSERTION PORT-A-CATH WITH ULTRASOUND GUIDANCE;  Surgeon: Curvin Deward MOULD, MD;  Location: [facility];  Service: General;  Laterality: N/A;   SIMPLE MASTECTOMY WITH AXILLARY SENTINEL NODE BIOPSY Left 05/06/2023   Procedure: LEFT MASTECTOMY AND SENTINEL NODE BIOPSY;  Surgeon: Curvin Deward MOULD, MD;  Location: [facility];  Service: General;  Laterality: Left;   Patient Active Problem List   Diagnosis Date Noted   Genetic testing 03/16/2024   Cancer of left female breast (HCC) 05/06/2023   Malignant neoplasm of upper-outer quadrant of left breast in female, estrogen receptor positive (HCC) 04/07/2023   Loss of  weight 12/09/2021   Loose stools 10/03/2020   Fatty liver 07/18/2019   GIB (gastrointestinal bleeding) 12/10/2018   Lower GI bleed 12/08/2018   Nontoxic multinodular goiter 05/22/2015   Diabetes mellitus without complication (HCC) 05/22/2015   Essential hypertension, benign 05/22/2015   Palpable thyroid  08/29/2014   MGUS (monoclonal gammopathy of unknown significance) 08/28/2014    PCP: Norleen General, MD  REFERRING PROVIDER: Crawford Morna Pickle, NP   REFERRING DIAG:  [phone] (ICD-10-CM) - Malignant neoplasm of upper-outer quadrant of left breast in female, estrogen receptor positive (HCC) I89.0 (ICD-10-CM) - Lymphedema  THERAPY DIAG:  1. Postmastectomy lymphedema   2. Stiffness of left shoulder, not elsewhere classified   3. Malignant neoplasm of upper-outer quadrant of left breast in female, estrogen receptor positive (HCC)    ONSET DATE: 01/01/24  Rationale for Evaluation and Treatment: Rehabilitation  SUBJECTIVE:                                                                                                                                                                                           SUBJECTIVE STATEMENT:  I can not find my sleeve, I don't know what happened to it. And right now I can't afford to to get the other one.   PERTINENT HISTORY: Left breast cancer, ER positive ILC with positive node on biopsy. 05/06/23: Lt mastectomy 05/06/23 with 3/4 nodes positive. 06/10/23: ALND 0/1 nodes. Completed chemotherapy with Taxotere  and cytoxan , and radiation. Other hx includes: kidney disease, DM, liver disease.   PAIN:  Are you having pain? No  PRECAUTIONS: lymphedema risk Left side   RED FLAGS: None   WEIGHT BEARING RESTRICTIONS: No  FALLS:  Has patient fallen in last 6 months? No  LIVING ENVIRONMENT: Lives with: lives alone her daughter stays with her for a few days Lives in a house Stairs: No Medical equipment: None  OCCUPATION:  Retired  LEISURE: Gardening  HAND DOMINANCE: right   PRIOR LEVEL OF FUNCTION: Independent  PATIENT GOALS: learn how  to manage lymphedema on her own and learn what to do and what not to do   OBJECTIVE: Note: Objective measures were completed at Evaluation unless otherwise noted.  COGNITION: Overall cognitive status: Within functional limits for tasks assessed   PALPATION:   OBSERVATIONS / OTHER ASSESSMENTS:   POSTURE: rounded shoulders    UPPER EXTREMITY AROM/PROM:  A/PROM RIGHT   eval   Shoulder extension 75  Shoulder flexion 150  Shoulder abduction 156  Shoulder internal rotation 36  Shoulder external rotation 86    (Blank rows = not tested)  A/PROM LEFT   eval 03/21/24 04/03/24  Shoulder extension 55    Shoulder flexion 115 119 135  Shoulder abduction 112 112 126  Shoulder internal rotation 42    Shoulder external rotation 89      (Blank rows = not tested)  CERVICAL AROM: All within normal limits:   LYMPHEDEMA ASSESSMENTS:   LANDMARK RIGHT  eval  At axilla  31.5  15 cm proximal to olecranon process 28.2  10 cm proximal to olecranon process 27.5  Olecranon process 26  15 cm proximal to ulnar styloid process 24.1  10 cm proximal to ulnar styloid process 20  Just proximal to ulnar styloid process 17  Across hand at thumb web space 20  At base of 2nd digit 6.4  (Blank rows = not tested)  LANDMARK LEFT  eval  At axilla  31  15 cm proximal to olecranon process 29  10 cm proximal to olecranon process 29  Olecranon process 26.7  15 cm proximal to ulnar styloid process 24  10 cm proximal to ulnar styloid process 19.4  Just proximal to ulnar styloid process 17.1  Across hand at thumb web space 19.8  At base of 2nd digit 6.5  (Blank rows = not tested)   L-DEX LYMPHEDEMA SCREENING: The patient was assessed using the L-Dex machine today to produce a lymphedema index baseline score. The patient will be reassessed on a regular basis (typically every 3  months) to obtain new L-Dex scores. If the score is > 6.5 points away from his/her baseline score indicating onset of subclinical lymphedema, it will be recommended to wear a compression garment for 4 weeks, 12 hours per day and then be reassessed. If the score continues to be > 6.5 points from baseline at reassessment, we will initiate lymphedema treatment. Assessing in this manner has a 95% rate of preventing clinically significant lymphedema.  L-DEX LYMPHEDEMA SCREENING Measurement Type: Unilateral L-DEX MEASUREMENT EXTREMITY: Upper Extremity POSITION : Standing DOMINANT SIDE: Right At Risk Side: Right BASELINE SCORE (UNILATERAL): -6.5 L-DEX SCORE (UNILATERAL): 3.8 VALUE CHANGE (UNILAT): 10.3  Flowsheet Row Outpatient Rehab from 03/09/2024 in [facility]  Lymphedema Life Impact Scale Total Score 14.71 %    TODAY'S TREATMENT: 04/17/24: Therapeutic Exercises  Pulleys into flex x 2 mins, and abd x 1 min Roll yellow ball up wall into flex x 10 and then Lt abd x 10  Modified downward dog on wall x 5 reps, 5 sec holds SOZO re done  Manual Therapy Issued size small TG soft for pt to wear as she conts to look for her compression sleeve we issued for her due to high change from baseline with her SOZO. She reports this feeling comfortable.  MLD to Lt UE: Short neck, superficial and deep abdominals, Rt axillary and pectoral nodes,  anterior inter-axillary anastomosis, Lt inguinal nodes, Lt axillo-inguinal, then focused on Lt upper arm (lateral, medial to lateral, and  then lateral again), forearm (both sides), and dorsum hand and fingers then retracing all steps and reviewed this with pt today while performing. She was applying much lighter, brushing the skin motion. So with hand over hand pressure reviewed correct pressure and skin stretch, then pt was able to return improved demo.   P/ROM during MLD into Lt shoulder flex, scaption and D2 with scapular depression  throughout   04/12/24: Therapeutic Exercises  Pulleys into flex x 2 mins, and abd x 1 min Roll yellow ball up wall into flex x 10 and then Lt abd x 5 reviewing technique of each Modified downward dog on wall x 5 reps, 5 sec holds Therapeutic Activities  Supine over half foam roll for following: Bil horz abd, bil UE scaption into a V x 10 each, alternating shoulder flex x 10  Manual Therapy MLD to Lt UE: Short neck, superficial and deep abdominals, Rt axillary and pectoral nodes,  anterior inter-axillary anastomosis, Lt inguinal nodes, Lt axillo-inguinal and anterior inter-axillary anastomosis, then focused on Lt upper arm (lateral, medial to lateral, and then lateral again), forearm (both sides), and dorsum hand and fingers then retracing all steps and  P/ROM during MLD into Lt shoulder flex, scaption and D2 with scapular depression throughout MFR to cording in axilla during P/ROM Self Care Measured pt for a Sigvaris Secure compression sleeve, size M2 is her fit and she requested beige. Sent order to her email through [employer] and pt verbalizes understanding she can place order at her convenience.    04/09/2024 Discussed laundering sleeve every 2 days if stretched out so it holds better Supine half foam roll;alternating shoulder flexion x 10, scaption B x 5, horizontal abduction x 5, snow angels x 5 MLD to Lt UE: Short neck, superficial and deep abdominals, Rt axillary and pectoral nodes,  anterior inter-axillary anastomosis, Lt inguinal nodes, Lt axillo-inguinal and anterior inter-axillary anastomosis, then focused on Lt upper arm (lateral, medial to lateral, and then lateral again), forearm (both sides), and dorsum hand and fingers then retracing all steps and  P/ROM during MLD into Lt shoulder flex, scaption and D2 with scapular depression throughout                                                                                                                                                PATIENT EDUCATION:  Education details: Supine scapular series with yellow theraband  Person educated: Patient Education method: Programmer, multimedia, Demonstration, Verbal cues, and Handouts Education comprehension: verbalized understanding and needs further education, tactile and VC's during  HOME EXERCISE PROGRAM: Post op exercises Wear compression sleeve 12 hours/day during waking hours Supine scapular series with yellow theraband  ASSESSMENT:  CLINICAL IMPRESSION: Re did SOZO today as pt hasn't been able to wear her sleeve due to misplacing it and needed to make sure we don't need to start bandaging, but  change from baseline is less today than last time so instead issued a size small TG soft for pt to wear as she conts to look for her compression sleeve. Reviewed self MLD and pt benefited from reinforcement of correct pressure and skin stretch. During session encouraged pt to call her insurance company to see where they will cover a compression sleeve and then we can hopefully help her with that by sending demographics in. Pt reports will do this soon.   OBJECTIVE IMPAIRMENTS: decreased activity tolerance, decreased knowledge of condition, decreased knowledge of use of DME, decreased ROM, increased edema, increased fascial restrictions, impaired UE functional use, and postural dysfunction.   ACTIVITY LIMITATIONS: carrying, lifting, and reach over head  PARTICIPATION LIMITATIONS: none  PERSONAL FACTORS: Age and 1 comorbidity: diabetes, radiation to axilla are also affecting patient's functional outcome.   REHAB POTENTIAL: Excellent  CLINICAL DECISION MAKING: Stable/uncomplicated  EVALUATION COMPLEXITY: Low  GOALS: Goals reviewed with patient? Yes  SHORT TERM GOALS=LTGs: Target date: 04/06/24  Pt will improve Lt shoulder flexion and abduction to equal to the opposite side to demonstrate improved reach  Baseline: Flex 119 and Abd 112; Flex 135 and Abd 126  Goal status:  PROGRESSING  2.  Pt will be ind with a home exercise program for continued stretching and strengthening. Baseline:  04/05/24 - independent with initial HEP, will benefit from review of self MLD Goal status: PARTIALLY MET  3.  Pt will be independent in self MLD for long term management of lymphedema.  Baseline:  Goal status: ONGOING  4. Pt will be educated on lymphedema surveillance, presentations and risk reduction  Goal Status: MET  5. Pt will return to the green on ldex screening to demonstrate reversal of subclinical lymphedema.  Goal status: ONGOING  PLAN:  PT FREQUENCY: 2x/week  PT DURATION: 4 weeks  PLANNED INTERVENTIONS: [id number]- PT Re-evaluation, 97110-Therapeutic exercises, 97530- Therapeutic activity, [id number]- Neuromuscular re-education, 97535- Self Care, [id number]- Manual therapy, 97760- Orthotic Initial, [phone]- Orthotic/Prosthetic subsequent, Patient/Family education, Balance training, Therapeutic exercises, and Self Care  PLAN FOR NEXT SESSION: Find coverage for compression sleeve? Cont with review of self MLD to work towards independence due to high change from baseline; may need compression bandaging, Cont Lt shoulder AAROM, AROM, PROM and scap mobility, STM to tight Lt pect insertion    Aden Berwyn Caldron, PTA 04/17/2024, 12:20 PM   Over Head Pull: Narrow and Wide Grip   [facility] Rehab [phone]   On back, knees bent, feet flat, band across thighs, elbows straight but relaxed. Pull hands apart (start). Keeping elbows straight, bring arms up and over head, hands toward floor. Keep pull steady on band. Hold momentarily. Return slowly, keeping pull steady, back to start. Then do same with a wider grip on the band (past shoulder width) Repeat _5-10__ times. Band color __yellow____   Side Pull: Double Arm   On back, knees bent, feet flat. Arms perpendicular to body, shoulder level, elbows straight but relaxed. Pull arms out to sides, elbows straight. Resistance band comes  across collarbones, hands toward floor. Hold momentarily. Slowly return to starting position. Repeat _5-10__ times. Band color _yellow____   Sword   On back, knees bent, feet flat, left hand on left hip, right hand above left. Pull right arm DIAGONALLY (hip to shoulder) across chest. Bring right arm along head toward floor. Hold momentarily. Slowly return to starting position. Repeat _5-10__ times. Do with left arm. Band color _yellow_____   Shoulder Rotation: Double Arm   On back, knees  bent, feet flat, elbows tucked at sides, bent 90, hands palms up. Pull hands apart and down toward floor, keeping elbows near sides. Hold momentarily. Slowly return to starting position. Repeat _5-10__ times. Band color __yellow____

## 2024-04-19 ENCOUNTER — Ambulatory Visit

## 2024-04-19 DIAGNOSIS — M25612 Stiffness of left shoulder, not elsewhere classified: Secondary | ICD-10-CM | POA: Diagnosis not present

## 2024-04-19 DIAGNOSIS — Z170 Estrogen receptor positive status [ER+]: Secondary | ICD-10-CM

## 2024-04-19 DIAGNOSIS — I972 Postmastectomy lymphedema syndrome: Secondary | ICD-10-CM

## 2024-04-19 DIAGNOSIS — C50412 Malignant neoplasm of upper-outer quadrant of left female breast: Secondary | ICD-10-CM | POA: Diagnosis not present

## 2024-04-19 DIAGNOSIS — Z9189 Other specified personal risk factors, not elsewhere classified: Secondary | ICD-10-CM | POA: Diagnosis not present

## 2024-04-19 NOTE — Therapy (Signed)
 OUTPATIENT PHYSICAL THERAPY  UPPER EXTREMITY ONCOLOGY TREATMENT  Patient Name: Melanie Welch MRN: [id number] DOB:[birth year], 78 y.o., female Today's Date: 04/19/2024  END OF SESSION:  PT End of Session - 04/19/24 1059     Visit Number 12    Number of Visits 17    Date for Recertification  05/03/24    Authorization Type 1 re;eval and 8 visits    Authorization Time Period 04/09/2024-05/03/2024    Authorization - Visit Number 4    Authorization - Number of Visits 8    Progress Note Due on Visit 18    PT Start Time 1057    PT Stop Time 1153    PT Time Calculation (min) 56 min    Activity Tolerance Patient tolerated treatment well    Behavior During Therapy WFL for tasks assessed/performed             Past Medical History:  Diagnosis Date   Breast cancer (HCC) 03/30/2023   Colonic adenoma 12/2018   ONE   Diabetes mellitus    Fatty liver    Hypertension    MGUS (monoclonal gammopathy of unknown significance)    Personal history of chemotherapy    Personal history of radiation therapy    Port-A-Cath in place 07/05/2023   Proteinuria    Past Surgical History:  Procedure Laterality Date   ABDOMINAL HYSTERECTOMY     AXILLARY LYMPH NODE DISSECTION Left 06/10/2023   Procedure: COMPLETION LEFT AXILLARY LYMPH NODE DISSECTION;  Surgeon: Curvin Deward MOULD, MD;  Location: [facility];  Service: General;  Laterality: Left;   BREAST BIOPSY  2005   left   BREAST BIOPSY Left 03/30/2023   US  LT BREAST BX W LOC DEV EA ADD LESION IMG BX SPEC US  GUIDE 03/30/2023 [facility]   BREAST BIOPSY Left 03/30/2023   MM LT BREAST BX W LOC DEV 1ST LESION IMAGE BX SPEC STEREO GUIDE 03/30/2023 [facility]   BREAST BIOPSY Left 03/30/2023   US  LT BREAST BX W LOC DEV 1ST LESION IMG BX SPEC US  GUIDE 03/30/2023 [facility]   BREAST BIOPSY Left 05/05/2023   US  LT RADIOACTIVE SEED LOC 05/05/2023 [facility]   CATARACT EXTRACTION W/PHACO Right 05/10/2017   Procedure:  CATARACT EXTRACTION PHACO AND INTRAOCULAR LENS PLACEMENT (IOC);  Surgeon: Roz Anes, MD;  Location: [facility];  Service: Ophthalmology;  Laterality: Right;  CDE: 5.04   CATARACT EXTRACTION W/PHACO Left 05/24/2017   Procedure: CATARACT EXTRACTION PHACO AND INTRAOCULAR LENS PLACEMENT (IOC);  Surgeon: Roz Anes, MD;  Location: [facility];  Service: Ophthalmology;  Laterality: Left;  CDE: 8.40   COLONOSCOPY  07/13/2011   Procedure: COLONOSCOPY;  Surgeon: Margo CHRISTELLA Haddock, MD;  Location: [facility];  Service: Endoscopy;  Laterality: N/A;  9:30 AM   COLONOSCOPY N/A 12/09/2018   diverticulosis, suspect diverticulum in mid sigmoid s/p epi, unable to place clip. 5 mm benign polyp. Colonic lipoma. Normal TI. Many diverticula occluded with stool.    MASTECTOMY Left 2024   NODE DISSECTION Left 05/06/2023   Procedure: TARGETED NODE DISSECTION;  Surgeon: Curvin Deward MOULD, MD;  Location[facility];  Service: General;  Laterality: Left;   POLYPECTOMY  12/09/2018   Procedure: POLYPECTOMY;  Surgeon: Shaaron Lamar CHRISTELLA, MD;  Location: [facility];  Service: Endoscopy;;  splenic flexure   PORT-A-CATH REMOVAL N/A 02/22/2024   Procedure: REMOVAL PORT-A-CATH;  Surgeon: Curvin Deward MOULD, MD;  Location: [facility];  Service: General;  Laterality: N/A;   PORTACATH PLACEMENT  N/A 06/10/2023   Procedure: INSERTION PORT-A-CATH WITH ULTRASOUND GUIDANCE;  Surgeon: Curvin Deward MOULD, MD;  Location: [facility];  Service: General;  Laterality: N/A;   SIMPLE MASTECTOMY WITH AXILLARY SENTINEL NODE BIOPSY Left 05/06/2023   Procedure: LEFT MASTECTOMY AND SENTINEL NODE BIOPSY;  Surgeon: Curvin Deward MOULD, MD;  Location: [facility];  Service: General;  Laterality: Left;   Patient Active Problem List   Diagnosis Date Noted   Genetic testing 03/16/2024   Cancer of left female breast (HCC) 05/06/2023   Malignant neoplasm of upper-outer quadrant of left breast in female, estrogen receptor positive (HCC) 04/07/2023   Loss of  weight 12/09/2021   Loose stools 10/03/2020   Fatty liver 07/18/2019   GIB (gastrointestinal bleeding) 12/10/2018   Lower GI bleed 12/08/2018   Nontoxic multinodular goiter 05/22/2015   Diabetes mellitus without complication (HCC) 05/22/2015   Essential hypertension, benign 05/22/2015   Palpable thyroid  08/29/2014   MGUS (monoclonal gammopathy of unknown significance) 08/28/2014    PCP: Norleen General, MD  REFERRING PROVIDER: Crawford Morna Pickle, NP   REFERRING DIAG:  [phone] (ICD-10-CM) - Malignant neoplasm of upper-outer quadrant of left breast in female, estrogen receptor positive (HCC) I89.0 (ICD-10-CM) - Lymphedema  THERAPY DIAG:  1. Postmastectomy lymphedema   2. Stiffness of left shoulder, not elsewhere classified   3. Malignant neoplasm of upper-outer quadrant of left breast in female, estrogen receptor positive (HCC)    ONSET DATE: 01/01/24  Rationale for Evaluation and Treatment: Rehabilitation  SUBJECTIVE:                                                                                                                                                                                           SUBJECTIVE STATEMENT:  I found my compression sleeve! I had stuck it in a drawer by my bed. I washed it yesterday and it was still damp this morning but I'll get it back on when I get home.   PERTINENT HISTORY: Left breast cancer, ER positive ILC with positive node on biopsy. 05/06/23: Lt mastectomy 05/06/23 with 3/4 nodes positive. 06/10/23: ALND 0/1 nodes. Completed chemotherapy with Taxotere  and cytoxan , and radiation. Other hx includes: kidney disease, DM, liver disease.   PAIN:  Are you having pain? No  PRECAUTIONS: lymphedema risk Left side   RED FLAGS: None   WEIGHT BEARING RESTRICTIONS: No  FALLS:  Has patient fallen in last 6 months? No  LIVING ENVIRONMENT: Lives with: lives alone her daughter stays with her for a few days Lives in a house Stairs:  No Medical equipment: None  OCCUPATION: Retired  LEISURE: Gardening  HAND DOMINANCE: right  PRIOR LEVEL OF FUNCTION: Independent  PATIENT GOALS: learn how to manage lymphedema on her own and learn what to do and what not to do   OBJECTIVE: Note: Objective measures were completed at Evaluation unless otherwise noted.  COGNITION: Overall cognitive status: Within functional limits for tasks assessed   PALPATION:   OBSERVATIONS / OTHER ASSESSMENTS:   POSTURE: rounded shoulders    UPPER EXTREMITY AROM/PROM:  A/PROM RIGHT   eval   Shoulder extension 75  Shoulder flexion 150  Shoulder abduction 156  Shoulder internal rotation 36  Shoulder external rotation 86    (Blank rows = not tested)  A/PROM LEFT   eval 03/21/24 04/03/24  Shoulder extension 55    Shoulder flexion 115 119 135  Shoulder abduction 112 112 126  Shoulder internal rotation 42    Shoulder external rotation 89      (Blank rows = not tested)  CERVICAL AROM: All within normal limits:   LYMPHEDEMA ASSESSMENTS:   LANDMARK RIGHT  eval  At axilla  31.5  15 cm proximal to olecranon process 28.2  10 cm proximal to olecranon process 27.5  Olecranon process 26  15 cm proximal to ulnar styloid process 24.1  10 cm proximal to ulnar styloid process 20  Just proximal to ulnar styloid process 17  Across hand at thumb web space 20  At base of 2nd digit 6.4  (Blank rows = not tested)  LANDMARK LEFT  eval  At axilla  31  15 cm proximal to olecranon process 29  10 cm proximal to olecranon process 29  Olecranon process 26.7  15 cm proximal to ulnar styloid process 24  10 cm proximal to ulnar styloid process 19.4  Just proximal to ulnar styloid process 17.1  Across hand at thumb web space 19.8  At base of 2nd digit 6.5  (Blank rows = not tested)   L-DEX LYMPHEDEMA SCREENING: The patient was assessed using the L-Dex machine today to produce a lymphedema index baseline score. The patient will be reassessed  on a regular basis (typically every 3 months) to obtain new L-Dex scores. If the score is > 6.5 points away from his/her baseline score indicating onset of subclinical lymphedema, it will be recommended to wear a compression garment for 4 weeks, 12 hours per day and then be reassessed. If the score continues to be > 6.5 points from baseline at reassessment, we will initiate lymphedema treatment. Assessing in this manner has a 95% rate of preventing clinically significant lymphedema.     Flowsheet Row Outpatient Rehab from 03/09/2024 in [facility]  Lymphedema Life Impact Scale Total Score 14.71 %    TODAY'S TREATMENT: 04/19/24: Therapeutic Exercises  Pulleys into flex x 2 mins, and abd x 1 min Roll yellow ball up wall with 1# on Lt wrist into flex x 10 and then Lt abd x 10  Modified downward dog on wall x 7 reps, 5 sec holds Bil UE flex and side to side trunk stretch with arms on large green ball on mat table x 5 returning therapist demo for increased stretch in Lt axilla Manual Therapy MLD to Lt UE: Short neck, superficial and deep abdominals, Rt axillary and pectoral nodes,  anterior inter-axillary anastomosis, Lt inguinal nodes, Lt axillo-inguinal, then focused on Lt upper arm (lateral, medial to lateral, and then lateral again), forearm (both sides), and dorsum hand and fingers then retracing all steps back to anastomosis. P/ROM during MLD into Lt shoulder flex, scaption and D2  with scapular depression throughout MFR to tightness in axilla STM to Lt pect insertion where pt palpably very tight  04/17/24: Therapeutic Exercises  Pulleys into flex x 2 mins, and abd x 1 min Roll yellow ball up wall into flex x 10 and then Lt abd x 10  Modified downward dog on wall x 5 reps, 5 sec holds SOZO re done  Manual Therapy Issued size small TG soft for pt to wear as she conts to look for her compression sleeve we issued for her due to high change from baseline with her SOZO.  She reports this feeling comfortable.  MLD to Lt UE: Short neck, superficial and deep abdominals, Rt axillary and pectoral nodes,  anterior inter-axillary anastomosis, Lt inguinal nodes, Lt axillo-inguinal, then focused on Lt upper arm (lateral, medial to lateral, and then lateral again), forearm (both sides), and dorsum hand and fingers then retracing all steps and reviewed this with pt today while performing. She was applying much lighter, brushing the skin motion. So with hand over hand pressure reviewed correct pressure and skin stretch, then pt was able to return improved demo.   P/ROM during MLD into Lt shoulder flex, scaption and D2 with scapular depression throughout   04/12/24: Therapeutic Exercises  Pulleys into flex x 2 mins, and abd x 1 min Roll yellow ball up wall into flex x 10 and then Lt abd x 5 reviewing technique of each Modified downward dog on wall x 5 reps, 5 sec holds Therapeutic Activities  Supine over half foam roll for following: Bil horz abd, bil UE scaption into a V x 10 each, alternating shoulder flex x 10  Manual Therapy MLD to Lt UE: Short neck, superficial and deep abdominals, Rt axillary and pectoral nodes,  anterior inter-axillary anastomosis, Lt inguinal nodes, Lt axillo-inguinal and anterior inter-axillary anastomosis, then focused on Lt upper arm (lateral, medial to lateral, and then lateral again), forearm (both sides), and dorsum hand and fingers then retracing all steps and  P/ROM during MLD into Lt shoulder flex, scaption and D2 with scapular depression throughout MFR to cording in axilla during P/ROM Self Care Measured pt for a Sigvaris Secure compression sleeve, size M2 is her fit and she requested beige. Sent order to her email through [employer] and pt verbalizes understanding she can place order at her convenience.                                                                                                                                               PATIENT EDUCATION:  Education details: Supine scapular series with yellow theraband  Person educated: Patient Education method: Explanation, Demonstration, Verbal cues, and Handouts Education comprehension: verbalized understanding and needs further education, tactile and VC's during  HOME EXERCISE PROGRAM: Post op exercises Wear compression sleeve 12 hours/day during waking hours Supine scapular series with yellow theraband  ASSESSMENT:  CLINICAL IMPRESSION: Pt found her compression sleeve so will resume wearing that today. Reviewed importance of 10 hrs/day, daily until next SOZO to hopefully reduce increased current subclinical lymphedema. Pt verbalizes good understanding. Continued with AA/ROM stretches and MLD with P/ROM to Lt shoulder/UE working to decrease fascial and soft tissue restrictions that are limiting to her end ROM.  OBJECTIVE IMPAIRMENTS: decreased activity tolerance, decreased knowledge of condition, decreased knowledge of use of DME, decreased ROM, increased edema, increased fascial restrictions, impaired UE functional use, and postural dysfunction.   ACTIVITY LIMITATIONS: carrying, lifting, and reach over head  PARTICIPATION LIMITATIONS: none  PERSONAL FACTORS: Age and 1 comorbidity: diabetes, radiation to axilla are also affecting patient's functional outcome.   REHAB POTENTIAL: Excellent  CLINICAL DECISION MAKING: Stable/uncomplicated  EVALUATION COMPLEXITY: Low  GOALS: Goals reviewed with patient? Yes  SHORT TERM GOALS=LTGs: Target date: 04/06/24  Pt will improve Lt shoulder flexion and abduction to equal to the opposite side to demonstrate improved reach  Baseline: Flex 119 and Abd 112; Flex 135 and Abd 126  Goal status: PROGRESSING  2.  Pt will be ind with a home exercise program for continued stretching and strengthening. Baseline:  04/05/24 - independent with initial HEP, will benefit from review of self MLD Goal status: PARTIALLY MET  3.   Pt will be independent in self MLD for long term management of lymphedema.  Baseline:  Goal status: ONGOING  4. Pt will be educated on lymphedema surveillance, presentations and risk reduction  Goal Status: MET  5. Pt will return to the green on ldex screening to demonstrate reversal of subclinical lymphedema.  Goal status: ONGOING  PLAN:  PT FREQUENCY: 2x/week  PT DURATION: 4 weeks  PLANNED INTERVENTIONS: 97164- PT Re-evaluation, 97110-Therapeutic exercises, 97530- Therapeutic activity, 97112- Neuromuscular re-education, 97535- Self Care, [id number]- Manual therapy, [phone]- Orthotic Initial, [phone]- Orthotic/Prosthetic subsequent, Patient/Family education, Balance training, Therapeutic exercises, and Self Care  PLAN FOR NEXT SESSION: Cont with review of self MLD to work towards independence due to high change from baseline; may need compression bandaging, Cont Lt shoulder AAROM, AROM, PROM and scap mobility, STM to tight Lt pect insertion    Aden Berwyn Caldron, PTA 04/19/2024, 11:56 AM   Over Head Pull: Narrow and Wide Grip   Cancer Rehab [phone]   On back, knees bent, feet flat, band across thighs, elbows straight but relaxed. Pull hands apart (start). Keeping elbows straight, bring arms up and over head, hands toward floor. Keep pull steady on band. Hold momentarily. Return slowly, keeping pull steady, back to start. Then do same with a wider grip on the band (past shoulder width) Repeat _5-10__ times. Band color __yellow____   Side Pull: Double Arm   On back, knees bent, feet flat. Arms perpendicular to body, shoulder level, elbows straight but relaxed. Pull arms out to sides, elbows straight. Resistance band comes across collarbones, hands toward floor. Hold momentarily. Slowly return to starting position. Repeat _5-10__ times. Band color _yellow____   Sword   On back, knees bent, feet flat, left hand on left hip, right hand above left. Pull right arm DIAGONALLY (hip to  shoulder) across chest. Bring right arm along head toward floor. Hold momentarily. Slowly return to starting position. Repeat _5-10__ times. Do with left arm. Band color _yellow_____   Shoulder Rotation: Double Arm   On back, knees bent, feet flat, elbows tucked at sides, bent 90, hands palms up. Pull hands apart and down toward floor, keeping elbows near sides. Hold momentarily. Slowly return to  starting position. Repeat _5-10__ times. Band color __yellow____

## 2024-04-24 ENCOUNTER — Ambulatory Visit

## 2024-04-24 ENCOUNTER — Telehealth: Payer: Self-pay

## 2024-04-24 NOTE — Telephone Encounter (Signed)
 Called pt due to No show. Pt very apologetic; she fell asleep and forgot her appt. Reminded about  on Appt on Thursday and pt will be here.

## 2024-04-26 ENCOUNTER — Ambulatory Visit

## 2024-04-26 DIAGNOSIS — Z170 Estrogen receptor positive status [ER+]: Secondary | ICD-10-CM | POA: Diagnosis not present

## 2024-04-26 DIAGNOSIS — Z9189 Other specified personal risk factors, not elsewhere classified: Secondary | ICD-10-CM | POA: Diagnosis not present

## 2024-04-26 DIAGNOSIS — M25612 Stiffness of left shoulder, not elsewhere classified: Secondary | ICD-10-CM

## 2024-04-26 DIAGNOSIS — I972 Postmastectomy lymphedema syndrome: Secondary | ICD-10-CM | POA: Diagnosis not present

## 2024-04-26 DIAGNOSIS — C50412 Malignant neoplasm of upper-outer quadrant of left female breast: Secondary | ICD-10-CM | POA: Diagnosis not present

## 2024-04-26 NOTE — Therapy (Signed)
 OUTPATIENT PHYSICAL THERAPY  UPPER EXTREMITY ONCOLOGY TREATMENT  Patient Name: Melanie Welch MRN: [id number] DOB:02/13/1946, 78 y.o., female Today's Date: 04/26/2024  END OF SESSION:  PT End of Session - 04/26/24 1110     Visit Number 13    Number of Visits 17    Date for Recertification  05/03/24    Authorization Type 1 re;eval and 8 visits    Authorization Time Period 04/09/2024-05/03/2024    Authorization - Visit Number 5    Authorization - Number of Visits 8    Progress Note Due on Visit 18    PT Start Time 1106    PT Stop Time 1200    PT Time Calculation (min) 54 min    Activity Tolerance Patient tolerated treatment well    Behavior During Therapy WFL for tasks assessed/performed             Past Medical History:  Diagnosis Date   Breast cancer (HCC) 03/30/2023   Colonic adenoma 12/2018   ONE   Diabetes mellitus    Fatty liver    Hypertension    MGUS (monoclonal gammopathy of unknown significance)    Personal history of chemotherapy    Personal history of radiation therapy    Port-A-Cath in place 07/05/2023   Proteinuria    Past Surgical History:  Procedure Laterality Date   ABDOMINAL HYSTERECTOMY     AXILLARY LYMPH NODE DISSECTION Left 06/10/2023   Procedure: COMPLETION LEFT AXILLARY LYMPH NODE DISSECTION;  Surgeon: Curvin Deward MOULD, MD;  Location: [facility];  Service: General;  Laterality: Left;   BREAST BIOPSY  2005   left   BREAST BIOPSY Left 03/30/2023   US  LT BREAST BX W LOC DEV EA ADD LESION IMG BX SPEC US  GUIDE 03/30/2023 [facility]   BREAST BIOPSY Left 03/30/2023   MM LT BREAST BX W LOC DEV 1ST LESION IMAGE BX SPEC STEREO GUIDE 03/30/2023 [facility]   BREAST BIOPSY Left 03/30/2023   US  LT BREAST BX W LOC DEV 1ST LESION IMG BX SPEC US  GUIDE 03/30/2023 [facility]   BREAST BIOPSY Left 05/05/2023   US  LT RADIOACTIVE SEED LOC 05/05/2023 [facility]   CATARACT EXTRACTION W/PHACO Right 05/10/2017   Procedure:  CATARACT EXTRACTION PHACO AND INTRAOCULAR LENS PLACEMENT (IOC);  Surgeon: Roz Anes, MD;  Location: [facility];  Service: Ophthalmology;  Laterality: Right;  CDE: 5.04   CATARACT EXTRACTION W/PHACO Left 05/24/2017   Procedure: CATARACT EXTRACTION PHACO AND INTRAOCULAR LENS PLACEMENT (IOC);  Surgeon: Roz Anes, MD;  Location: [facility];  Service: Ophthalmology;  Laterality: Left;  CDE: 8.40   COLONOSCOPY  07/13/2011   Procedure: COLONOSCOPY;  Surgeon: Margo CHRISTELLA Haddock, MD;  Location: [facility];  Service: Endoscopy;  Laterality: N/A;  9:30 AM   COLONOSCOPY N/A 12/09/2018   diverticulosis, suspect diverticulum in mid sigmoid s/p epi, unable to place clip. 5 mm benign polyp. Colonic lipoma. Normal TI. Many diverticula occluded with stool.    MASTECTOMY Left 2024   NODE DISSECTION Left 05/06/2023   Procedure: TARGETED NODE DISSECTION;  Surgeon: Curvin Deward MOULD, MD;  Location: [facility];  Service: General;  Laterality: Left;   POLYPECTOMY  12/09/2018   Procedure: POLYPECTOMY;  Surgeon: Shaaron Lamar CHRISTELLA, MD;  Location: [facility];  Service: Endoscopy;;  splenic flexure   PORT-A-CATH REMOVAL N/A 02/22/2024   Procedure: REMOVAL PORT-A-CATH;  Surgeon: Curvin Deward MOULD, MD;  Location: [facility];  Service: General;  Laterality: N/A;   PORTACATH PLACEMENT  N/A 06/10/2023   Procedure: INSERTION PORT-A-CATH WITH ULTRASOUND GUIDANCE;  Surgeon: Curvin Deward MOULD, MD;  Location: [facility];  Service: General;  Laterality: N/A;   SIMPLE MASTECTOMY WITH AXILLARY SENTINEL NODE BIOPSY Left 05/06/2023   Procedure: LEFT MASTECTOMY AND SENTINEL NODE BIOPSY;  Surgeon: Curvin Deward MOULD, MD;  Location: [facility];  Service: General;  Laterality: Left;   Patient Active Problem List   Diagnosis Date Noted   Genetic testing 03/16/2024   Cancer of left female breast (HCC) 05/06/2023   Malignant neoplasm of upper-outer quadrant of left breast in female, estrogen receptor positive (HCC) 04/07/2023   Loss of  weight 12/09/2021   Loose stools 10/03/2020   Fatty liver 07/18/2019   GIB (gastrointestinal bleeding) 12/10/2018   Lower GI bleed 12/08/2018   Nontoxic multinodular goiter 05/22/2015   Diabetes mellitus without complication (HCC) 05/22/2015   Essential hypertension, benign 05/22/2015   Palpable thyroid  08/29/2014   MGUS (monoclonal gammopathy of unknown significance) 08/28/2014    PCP: Norleen General, MD  REFERRING PROVIDER: Crawford Morna Pickle, NP   REFERRING DIAG:  [phone] (ICD-10-CM) - Malignant neoplasm of upper-outer quadrant of left breast in female, estrogen receptor positive (HCC) I89.0 (ICD-10-CM) - Lymphedema  THERAPY DIAG:  1. Postmastectomy lymphedema   2. Stiffness of left shoulder, not elsewhere classified   3. Malignant neoplasm of upper-outer quadrant of left breast in female, estrogen receptor positive (HCC)    ONSET DATE: 01/01/24  Rationale for Evaluation and Treatment: Rehabilitation  SUBJECTIVE:                                                                                                                                                                                           SUBJECTIVE STATEMENT:  I've been wearing my compression sleeve and that's going fine.   PERTINENT HISTORY: Left breast cancer, ER positive ILC with positive node on biopsy. 05/06/23: Lt mastectomy 05/06/23 with 3/4 nodes positive. 06/10/23: ALND 0/1 nodes. Completed chemotherapy with Taxotere  and cytoxan , and radiation. Other hx includes: kidney disease, DM, liver disease.   PAIN:  Are you having pain? No  PRECAUTIONS: lymphedema risk Left side   RED FLAGS: None   WEIGHT BEARING RESTRICTIONS: No  FALLS:  Has patient fallen in last 6 months? No  LIVING ENVIRONMENT: Lives with: lives alone her daughter stays with her for a few days Lives in a house Stairs: No Medical equipment: None  OCCUPATION: Retired  LEISURE: Gardening  HAND DOMINANCE: right   PRIOR  LEVEL OF FUNCTION: Independent  PATIENT GOALS: learn how to manage lymphedema on her own and learn what to do and what not to  do   OBJECTIVE: Note: Objective measures were completed at Evaluation unless otherwise noted.  COGNITION: Overall cognitive status: Within functional limits for tasks assessed   PALPATION:   OBSERVATIONS / OTHER ASSESSMENTS:   POSTURE: rounded shoulders    UPPER EXTREMITY AROM/PROM:  A/PROM RIGHT   eval   Shoulder extension 75  Shoulder flexion 150  Shoulder abduction 156  Shoulder internal rotation 36  Shoulder external rotation 86    (Blank rows = not tested)  A/PROM LEFT   eval 03/21/24 04/03/24  Shoulder extension 55    Shoulder flexion 115 119 135  Shoulder abduction 112 112 126  Shoulder internal rotation 42    Shoulder external rotation 89      (Blank rows = not tested)  CERVICAL AROM: All within normal limits:   LYMPHEDEMA ASSESSMENTS:   LANDMARK RIGHT  eval  At axilla  31.5  15 cm proximal to olecranon process 28.2  10 cm proximal to olecranon process 27.5  Olecranon process 26  15 cm proximal to ulnar styloid process 24.1  10 cm proximal to ulnar styloid process 20  Just proximal to ulnar styloid process 17  Across hand at thumb web space 20  At base of 2nd digit 6.4  (Blank rows = not tested)  LANDMARK LEFT  eval  At axilla  31  15 cm proximal to olecranon process 29  10 cm proximal to olecranon process 29  Olecranon process 26.7  15 cm proximal to ulnar styloid process 24  10 cm proximal to ulnar styloid process 19.4  Just proximal to ulnar styloid process 17.1  Across hand at thumb web space 19.8  At base of 2nd digit 6.5  (Blank rows = not tested)   L-DEX LYMPHEDEMA SCREENING: The patient was assessed using the L-Dex machine today to produce a lymphedema index baseline score. The patient will be reassessed on a regular basis (typically every 3 months) to obtain new L-Dex scores. If the score is > 6.5 points away  from his/her baseline score indicating onset of subclinical lymphedema, it will be recommended to wear a compression garment for 4 weeks, 12 hours per day and then be reassessed. If the score continues to be > 6.5 points from baseline at reassessment, we will initiate lymphedema treatment. Assessing in this manner has a 95% rate of preventing clinically significant lymphedema.     Flowsheet Row Outpatient Rehab from 03/09/2024 in [facility]  Lymphedema Life Impact Scale Total Score 14.71 %    TODAY'S TREATMENT: 04/26/24: Therapeutic Exercises  Pulleys into flex x 2 mins, and abd x 1 min Roll yellow ball up wall with 1# on Lt wrist into flex x 10 and then Lt abd x 10  Therapeutic Activities Free Motion Machine for postural strength 7# x 10 reps, then bil UE ext 3# x 10 reps returning therapist demo for each Manual Therapy MLD to Lt UE: Short neck, superficial and deep abdominals, Rt axillary and pectoral nodes,  anterior inter-axillary anastomosis, Lt inguinal nodes, Lt axillo-inguinal, then focused on Lt upper arm (lateral, medial to lateral, and then lateral again), forearm (both sides), and dorsum hand and fingers then retracing all steps back to anastomosis. P/ROM during MLD into Lt shoulder flex, scaption and D2 with scapular depression throughout MFR to tightness in axilla STM to Lt pect insertion where pt palpably very tight  04/19/24: Therapeutic Exercises  Pulleys into flex x 2 mins, and abd x 1 min Roll yellow ball up wall  with 1# on Lt wrist into flex x 10 and then Lt abd x 10  Modified downward dog on wall x 7 reps, 5 sec holds Bil UE flex and side to side trunk stretch with arms on large green ball on mat table x 5 returning therapist demo for increased stretch in Lt axilla Manual Therapy MLD to Lt UE: Short neck, superficial and deep abdominals, Rt axillary and pectoral nodes,  anterior inter-axillary anastomosis, Lt inguinal nodes, Lt axillo-inguinal,  then focused on Lt upper arm (lateral, medial to lateral, and then lateral again), forearm (both sides), and dorsum hand and fingers then retracing all steps back to anastomosis. P/ROM during MLD into Lt shoulder flex, scaption and D2 with scapular depression throughout MFR to tightness in axilla STM to Lt pect insertion where pt palpably very tight  04/17/24: Therapeutic Exercises  Pulleys into flex x 2 mins, and abd x 1 min Roll yellow ball up wall into flex x 10 and then Lt abd x 10  Modified downward dog on wall x 5 reps, 5 sec holds SOZO re done  Manual Therapy Issued size small TG soft for pt to wear as she conts to look for her compression sleeve we issued for her due to high change from baseline with her SOZO. She reports this feeling comfortable.  MLD to Lt UE: Short neck, superficial and deep abdominals, Rt axillary and pectoral nodes,  anterior inter-axillary anastomosis, Lt inguinal nodes, Lt axillo-inguinal, then focused on Lt upper arm (lateral, medial to lateral, and then lateral again), forearm (both sides), and dorsum hand and fingers then retracing all steps and reviewed this with pt today while performing. She was applying much lighter, brushing the skin motion. So with hand over hand pressure reviewed correct pressure and skin stretch, then pt was able to return improved demo.   P/ROM during MLD into Lt shoulder flex, scaption and D2 with scapular depression throughout   04/12/24: Therapeutic Exercises  Pulleys into flex x 2 mins, and abd x 1 min Roll yellow ball up wall into flex x 10 and then Lt abd x 5 reviewing technique of each Modified downward dog on wall x 5 reps, 5 sec holds Therapeutic Activities  Supine over half foam roll for following: Bil horz abd, bil UE scaption into a V x 10 each, alternating shoulder flex x 10  Manual Therapy MLD to Lt UE: Short neck, superficial and deep abdominals, Rt axillary and pectoral nodes,  anterior inter-axillary anastomosis,  Lt inguinal nodes, Lt axillo-inguinal and anterior inter-axillary anastomosis, then focused on Lt upper arm (lateral, medial to lateral, and then lateral again), forearm (both sides), and dorsum hand and fingers then retracing all steps and  P/ROM during MLD into Lt shoulder flex, scaption and D2 with scapular depression throughout MFR to cording in axilla during P/ROM Self Care Measured pt for a Sigvaris Secure compression sleeve, size M2 is her fit and she requested beige. Sent order to her email through [employer] and pt verbalizes understanding she can place order at her convenience.  PATIENT EDUCATION:  Education details: Supine scapular series with yellow theraband  Person educated: Patient Education method: Explanation, Demonstration, Verbal cues, and Handouts Education comprehension: verbalized understanding and needs further education, tactile and VC's during  HOME EXERCISE PROGRAM: Post op exercises Wear compression sleeve 12 hours/day during waking hours Supine scapular series with yellow theraband  ASSESSMENT:  CLINICAL IMPRESSION: Continued  with AA/A/ROM and progressed pt to include postural strength with resistance. Then continued with MLD of Lt UE and P/ROM with MFR to Lt shoulder.   OBJECTIVE IMPAIRMENTS: decreased activity tolerance, decreased knowledge of condition, decreased knowledge of use of DME, decreased ROM, increased edema, increased fascial restrictions, impaired UE functional use, and postural dysfunction.   ACTIVITY LIMITATIONS: carrying, lifting, and reach over head  PARTICIPATION LIMITATIONS: none  PERSONAL FACTORS: Age and 1 comorbidity: diabetes, radiation to axilla are also affecting patient's functional outcome.   REHAB POTENTIAL: Excellent  CLINICAL DECISION MAKING: Stable/uncomplicated  EVALUATION  COMPLEXITY: Low  GOALS: Goals reviewed with patient? Yes  SHORT TERM GOALS=LTGs: Target date: 04/06/24  Pt will improve Lt shoulder flexion and abduction to equal to the opposite side to demonstrate improved reach  Baseline: Flex 119 and Abd 112; Flex 135 and Abd 126  Goal status: PROGRESSING  2.  Pt will be ind with a home exercise program for continued stretching and strengthening. Baseline:  04/05/24 - independent with initial HEP, will benefit from review of self MLD Goal status: PARTIALLY MET  3.  Pt will be independent in self MLD for long term management of lymphedema.  Baseline:  Goal status: ONGOING  4. Pt will be educated on lymphedema surveillance, presentations and risk reduction  Goal Status: MET  5. Pt will return to the green on ldex screening to demonstrate reversal of subclinical lymphedema.  Goal status: ONGOING  PLAN:  PT FREQUENCY: 2x/week  PT DURATION: 4 weeks  PLANNED INTERVENTIONS: [id number]- PT Re-evaluation, 97110-Therapeutic exercises, 97530- Therapeutic activity, 97112- Neuromuscular re-education, 97535- Self Care, [id number]- Manual therapy, [phone]- Orthotic Initial, [phone]- Orthotic/Prosthetic subsequent, Patient/Family education, Balance training, Therapeutic exercises, and Self Care  PLAN FOR NEXT SESSION: Cont with review of self MLD to work towards independence due to high change from baseline; may need compression bandaging, Cont Lt shoulder AAROM, AROM, PROM and scap mobility, STM to tight Lt pect insertion    Aden Berwyn Caldron, PTA 04/26/2024, 12:07 PM

## 2024-05-01 ENCOUNTER — Encounter: Payer: Self-pay | Admitting: Adult Health

## 2024-05-01 ENCOUNTER — Ambulatory Visit

## 2024-05-01 DIAGNOSIS — M25612 Stiffness of left shoulder, not elsewhere classified: Secondary | ICD-10-CM

## 2024-05-01 DIAGNOSIS — I972 Postmastectomy lymphedema syndrome: Secondary | ICD-10-CM

## 2024-05-01 DIAGNOSIS — Z170 Estrogen receptor positive status [ER+]: Secondary | ICD-10-CM | POA: Diagnosis not present

## 2024-05-01 DIAGNOSIS — C50412 Malignant neoplasm of upper-outer quadrant of left female breast: Secondary | ICD-10-CM

## 2024-05-01 DIAGNOSIS — Z9189 Other specified personal risk factors, not elsewhere classified: Secondary | ICD-10-CM | POA: Diagnosis not present

## 2024-05-01 NOTE — Therapy (Signed)
 OUTPATIENT PHYSICAL THERAPY  UPPER EXTREMITY ONCOLOGY TREATMENT  Patient Name: Melanie Welch MRN: [id number] DOB:[birth year], 78 y.o., female Today's Date: 05/01/2024  END OF SESSION:  PT End of Session - 05/01/24 1107     Visit Number 14    Number of Visits 17    Date for Recertification  05/03/24    Authorization Type 1 re;eval and 8 visits    Authorization Time Period 04/09/2024-05/03/2024    Authorization - Visit Number 6    Authorization - Number of Visits 8    Progress Note Due on Visit 18    PT Start Time 1102    PT Stop Time 1159    PT Time Calculation (min) 57 min    Activity Tolerance Patient tolerated treatment well    Behavior During Therapy WFL for tasks assessed/performed             Past Medical History:  Diagnosis Date   Breast cancer (HCC) 03/30/2023   Colonic adenoma 12/2018   ONE   Diabetes mellitus    Fatty liver    Hypertension    MGUS (monoclonal gammopathy of unknown significance)    Personal history of chemotherapy    Personal history of radiation therapy    Port-A-Cath in place 07/05/2023   Proteinuria    Past Surgical History:  Procedure Laterality Date   ABDOMINAL HYSTERECTOMY     AXILLARY LYMPH NODE DISSECTION Left 06/10/2023   Procedure: COMPLETION LEFT AXILLARY LYMPH NODE DISSECTION;  Surgeon: Curvin Deward MOULD, MD;  Location: [facility];  Service: General;  Laterality: Left;   BREAST BIOPSY  2005   left   BREAST BIOPSY Left 03/30/2023   US  LT BREAST BX W LOC DEV EA ADD LESION IMG BX SPEC US  GUIDE 03/30/2023 [facility]   BREAST BIOPSY Left 03/30/2023   MM LT BREAST BX W LOC DEV 1ST LESION IMAGE BX SPEC STEREO GUIDE 03/30/2023 [facility]   BREAST BIOPSY Left 03/30/2023   US  LT BREAST BX W LOC DEV 1ST LESION IMG BX SPEC US  GUIDE 03/30/2023 [facility]   BREAST BIOPSY Left 05/05/2023   US  LT RADIOACTIVE SEED LOC 05/05/2023 [facility]   CATARACT EXTRACTION W/PHACO Right 05/10/2017   Procedure:  CATARACT EXTRACTION PHACO AND INTRAOCULAR LENS PLACEMENT (IOC);  Surgeon: Roz Anes, MD;  Location: [facility];  Service: Ophthalmology;  Laterality: Right;  CDE: 5.04   CATARACT EXTRACTION W/PHACO Left 05/24/2017   Procedure: CATARACT EXTRACTION PHACO AND INTRAOCULAR LENS PLACEMENT (IOC);  Surgeon: Roz Anes, MD;  Location: [facility];  Service: Ophthalmology;  Laterality: Left;  CDE: 8.40   COLONOSCOPY  07/13/2011   Procedure: COLONOSCOPY;  Surgeon: Margo CHRISTELLA Haddock, MD;  Location: [facility];  Service: Endoscopy;  Laterality: N/A;  9:30 AM   COLONOSCOPY N/A 12/09/2018   diverticulosis, suspect diverticulum in mid sigmoid s/p epi, unable to place clip. 5 mm benign polyp. Colonic lipoma. Normal TI. Many diverticula occluded with stool.    MASTECTOMY Left 2024   NODE DISSECTION Left 05/06/2023   Procedure: TARGETED NODE DISSECTION;  Surgeon: Curvin Deward MOULD, MD;  Location: [facility];  Service: General;  Laterality: Left;   POLYPECTOMY  12/09/2018   Procedure: POLYPECTOMY;  Surgeon: Shaaron Lamar CHRISTELLA, MD;  Location: [facility];  Service: Endoscopy;;  splenic flexure   PORT-A-CATH REMOVAL N/A 02/22/2024   Procedure: REMOVAL PORT-A-CATH;  Surgeon: Curvin Deward MOULD, MD;  Location: [facility];  Service: General;  Laterality: N/A;   PORTACATH PLACEMENT  N/A 06/10/2023   Procedure: INSERTION PORT-A-CATH WITH ULTRASOUND GUIDANCE;  Surgeon: Curvin Deward MOULD, MD;  Location: [facility];  Service: General;  Laterality: N/A;   SIMPLE MASTECTOMY WITH AXILLARY SENTINEL NODE BIOPSY Left 05/06/2023   Procedure: LEFT MASTECTOMY AND SENTINEL NODE BIOPSY;  Surgeon: Curvin Deward MOULD, MD;  Location: [facility];  Service: General;  Laterality: Left;   Patient Active Problem List   Diagnosis Date Noted   Genetic testing 03/16/2024   Cancer of left female breast (HCC) 05/06/2023   Malignant neoplasm of upper-outer quadrant of left breast in female, estrogen receptor positive (HCC) 04/07/2023   Loss of  weight 12/09/2021   Loose stools 10/03/2020   Fatty liver 07/18/2019   GIB (gastrointestinal bleeding) 12/10/2018   Lower GI bleed 12/08/2018   Nontoxic multinodular goiter 05/22/2015   Diabetes mellitus without complication (HCC) 05/22/2015   Essential hypertension, benign 05/22/2015   Palpable thyroid  08/29/2014   MGUS (monoclonal gammopathy of unknown significance) 08/28/2014    PCP: Norleen General, MD  REFERRING PROVIDER: Crawford Morna Pickle, NP   REFERRING DIAG:  [phone] (ICD-10-CM) - Malignant neoplasm of upper-outer quadrant of left breast in female, estrogen receptor positive (HCC) I89.0 (ICD-10-CM) - Lymphedema  THERAPY DIAG:  1. Postmastectomy lymphedema   2. Stiffness of left shoulder, not elsewhere classified   3. Malignant neoplasm of upper-outer quadrant of left breast in female, estrogen receptor positive (HCC)    ONSET DATE: 01/01/24  Rationale for Evaluation and Treatment: Rehabilitation  SUBJECTIVE:                                                                                                                                                                                           SUBJECTIVE STATEMENT:  I'm feeling pretty good today. I did have a tender spot I noticed when I last showered at the bottom of my breast when the shower hit it. I didn't know if it was just the nerves healing.  PERTINENT HISTORY: Left breast cancer, ER positive ILC with positive node on biopsy. 05/06/23: Lt mastectomy 05/06/23 with 3/4 nodes positive. 06/10/23: ALND 0/1 nodes. Completed chemotherapy with Taxotere  and cytoxan , and radiation. Other hx includes: kidney disease, DM, liver disease.   PAIN:  Are you having pain? No  PRECAUTIONS: lymphedema risk Left side   RED FLAGS: None   WEIGHT BEARING RESTRICTIONS: No  FALLS:  Has patient fallen in last 6 months? No  LIVING ENVIRONMENT: Lives with: lives alone her daughter stays with her for a few days Lives in a  house Stairs: No Medical equipment: None  OCCUPATION: Retired  LEISURE: Gardening  HAND DOMINANCE: right  PRIOR LEVEL OF FUNCTION: Independent  PATIENT GOALS: learn how to manage lymphedema on her own and learn what to do and what not to do   OBJECTIVE: Note: Objective measures were completed at Evaluation unless otherwise noted.  COGNITION: Overall cognitive status: Within functional limits for tasks assessed   PALPATION:   OBSERVATIONS / OTHER ASSESSMENTS:   POSTURE: rounded shoulders    UPPER EXTREMITY AROM/PROM:  A/PROM RIGHT   eval   Shoulder extension 75  Shoulder flexion 150  Shoulder abduction 156  Shoulder internal rotation 36  Shoulder external rotation 86    (Blank rows = not tested)  A/PROM LEFT   eval 03/21/24 04/03/24  Shoulder extension 55    Shoulder flexion 115 119 135  Shoulder abduction 112 112 126  Shoulder internal rotation 42    Shoulder external rotation 89      (Blank rows = not tested)  CERVICAL AROM: All within normal limits:   LYMPHEDEMA ASSESSMENTS:   LANDMARK RIGHT  eval  At axilla  31.5  15 cm proximal to olecranon process 28.2  10 cm proximal to olecranon process 27.5  Olecranon process 26  15 cm proximal to ulnar styloid process 24.1  10 cm proximal to ulnar styloid process 20  Just proximal to ulnar styloid process 17  Across hand at thumb web space 20  At base of 2nd digit 6.4  (Blank rows = not tested)  LANDMARK LEFT  eval  At axilla  31  15 cm proximal to olecranon process 29  10 cm proximal to olecranon process 29  Olecranon process 26.7  15 cm proximal to ulnar styloid process 24  10 cm proximal to ulnar styloid process 19.4  Just proximal to ulnar styloid process 17.1  Across hand at thumb web space 19.8  At base of 2nd digit 6.5  (Blank rows = not tested)   L-DEX LYMPHEDEMA SCREENING: The patient was assessed using the L-Dex machine today to produce a lymphedema index baseline score. The patient  will be reassessed on a regular basis (typically every 3 months) to obtain new L-Dex scores. If the score is > 6.5 points away from his/her baseline score indicating onset of subclinical lymphedema, it will be recommended to wear a compression garment for 4 weeks, 12 hours per day and then be reassessed. If the score continues to be > 6.5 points from baseline at reassessment, we will initiate lymphedema treatment. Assessing in this manner has a 95% rate of preventing clinically significant lymphedema.     Flowsheet Row Outpatient Rehab from 03/09/2024 in [facility]  Lymphedema Life Impact Scale Total Score 14.71 %    TODAY'S TREATMENT: 05/01/24: Therapeutic Exercises  Roll yellow ball up wall with 1# on Lt wrist into flex x 10 and then Lt abd x 10 Pulleys into flex and abd x 2 mins each with VC's during to remind to relax shoulders during Therapeutic Activities Free Motion Machine for postural strength 7# x 10 reps, then bil UE ext 3# x 10 reps returning therapist demo for each Manual Therapy MLD to Lt UE: Short neck, superficial and deep abdominals, Rt axillary and pectoral nodes,  anterior inter-axillary anastomosis, Lt inguinal nodes, Lt axillo-inguinal, then focused on Lt upper arm (lateral, medial to lateral, and then lateral again), forearm (both sides), and dorsum hand and fingers then retracing all steps back to anastomosis having pt return demo of each step for review.  P/ROM during MLD into Lt shoulder flex, scaption and  D2 with scapular depression throughout MFR to tightness in axilla STM to Lt pect insertion where pt palpably very tight  04/26/24: Therapeutic Exercises  Pulleys into flex x 2 mins, and abd x 1 min Roll yellow ball up wall with 1# on Lt wrist into flex x 10 and then Lt abd x 10 Therapeutic Activities Free Motion Machine for postural strength 7# x 10 reps, then bil UE ext 3# x 10 reps returning therapist demo for each Manual Therapy MLD to  Lt UE: Short neck, superficial and deep abdominals, Rt axillary and pectoral nodes,  anterior inter-axillary anastomosis, Lt inguinal nodes, Lt axillo-inguinal, then focused on Lt upper arm (lateral, medial to lateral, and then lateral again), forearm (both sides), and dorsum hand and fingers then retracing all steps back to anastomosis.  P/ROM during MLD into Lt shoulder flex, scaption and D2 with scapular depression throughout MFR to tightness in axilla STM to Lt pect insertion where pt palpably very tight  04/19/24: Therapeutic Exercises  Pulleys into flex x 2 mins, and abd x 1 min Roll yellow ball up wall with 1# on Lt wrist into flex x 10 and then Lt abd x 10  Modified downward dog on wall x 7 reps, 5 sec holds Bil UE flex and side to side trunk stretch with arms on large green ball on mat table x 5 returning therapist demo for increased stretch in Lt axilla Manual Therapy MLD to Lt UE: Short neck, superficial and deep abdominals, Rt axillary and pectoral nodes,  anterior inter-axillary anastomosis, Lt inguinal nodes, Lt axillo-inguinal, then focused on Lt upper arm (lateral, medial to lateral, and then lateral again), forearm (both sides), and dorsum hand and fingers then retracing all steps back to anastomosis. P/ROM during MLD into Lt shoulder flex, scaption and D2 with scapular depression throughout MFR to tightness in axilla STM to Lt pect insertion where pt palpably very tight                                                                                                                                            PATIENT EDUCATION:  Education details: Supine scapular series with yellow theraband  Person educated: Patient Education method: Explanation, Demonstration, Verbal cues, and Handouts Education comprehension: verbalized understanding and needs further education, tactile and VC's during  HOME EXERCISE PROGRAM: Post op exercises Wear compression sleeve 12 hours/day during  waking hours Supine scapular series with yellow theraband  ASSESSMENT:  CLINICAL IMPRESSION: Continued with AA/ROM and postural strength. Then continued with MLD of Lt UE and reviewed with pt throughout while performing. Discussed possibility of D/C next session. Pt reports she'll feel ready if her SOZO is back WNLs.   OBJECTIVE IMPAIRMENTS: decreased activity tolerance, decreased knowledge of condition, decreased knowledge of use of DME, decreased ROM, increased edema, increased fascial restrictions, impaired UE functional use, and postural dysfunction.   ACTIVITY  LIMITATIONS: carrying, lifting, and reach over head  PARTICIPATION LIMITATIONS: none  PERSONAL FACTORS: Age and 1 comorbidity: diabetes, radiation to axilla are also affecting patient's functional outcome.   REHAB POTENTIAL: Excellent  CLINICAL DECISION MAKING: Stable/uncomplicated  EVALUATION COMPLEXITY: Low  GOALS: Goals reviewed with patient? Yes  SHORT TERM GOALS=LTGs: Target date: 04/06/24  Pt will improve Lt shoulder flexion and abduction to equal to the opposite side to demonstrate improved reach  Baseline: Flex 119 and Abd 112; Flex 135 and Abd 126  Goal status: PROGRESSING  2.  Pt will be ind with a home exercise program for continued stretching and strengthening. Baseline:  04/05/24 - independent with initial HEP, will benefit from review of self MLD Goal status: PARTIALLY MET  3.  Pt will be independent in self MLD for long term management of lymphedema.  Baseline:  Goal status: ONGOING  4. Pt will be educated on lymphedema surveillance, presentations and risk reduction  Goal Status: MET  5. Pt will return to the green on ldex screening to demonstrate reversal of subclinical lymphedema.  Goal status: ONGOING  PLAN:  PT FREQUENCY: 2x/week  PT DURATION: 4 weeks  PLANNED INTERVENTIONS: [id number]- PT Re-evaluation, 97110-Therapeutic exercises, 97530- Therapeutic activity, [id number]- Neuromuscular re-education,  97535- Self Care, [id number]- Manual therapy, 97760- Orthotic Initial, [phone]- Orthotic/Prosthetic subsequent, Patient/Family education, Balance training, Therapeutic exercises, and Self Care  PLAN FOR NEXT SESSION: SOZO next and determine if pt ready for D/C. Cont with review of self MLD to work towards independence due to high change from baseline; may need compression bandaging, Cont Lt shoulder AAROM, AROM, PROM and scap mobility, STM to tight Lt pect insertion    Aden Berwyn Caldron, PTA 05/01/2024, 12:03 PM

## 2024-05-03 ENCOUNTER — Ambulatory Visit: Attending: Adult Health

## 2024-05-03 VITALS — Wt 162.5 lb

## 2024-05-03 DIAGNOSIS — M25612 Stiffness of left shoulder, not elsewhere classified: Secondary | ICD-10-CM | POA: Diagnosis present

## 2024-05-03 DIAGNOSIS — Z170 Estrogen receptor positive status [ER+]: Secondary | ICD-10-CM | POA: Insufficient documentation

## 2024-05-03 DIAGNOSIS — C50412 Malignant neoplasm of upper-outer quadrant of left female breast: Secondary | ICD-10-CM | POA: Insufficient documentation

## 2024-05-03 DIAGNOSIS — I972 Postmastectomy lymphedema syndrome: Secondary | ICD-10-CM | POA: Diagnosis present

## 2024-05-03 NOTE — Therapy (Addendum)
 OUTPATIENT PHYSICAL THERAPY  UPPER EXTREMITY ONCOLOGY TREATMENT  Patient Name: Melanie Welch MRN: [id number] DOB:11-19-1945, 78 y.o., female Today's Date: 05/03/2024  END OF SESSION:  PT End of Session - 05/03/24 1206     Visit Number 15    Number of Visits 17    Date for Recertification  05/03/24    Authorization Type 1 re;eval and 8 visits    Authorization Time Period 04/09/2024-05/03/2024    Authorization - Visit Number 7    Authorization - Number of Visits 8    PT Start Time 1104    PT Stop Time 1159    PT Time Calculation (min) 55 min    Activity Tolerance Patient tolerated treatment well    Behavior During Therapy [facility] for tasks assessed/performed              Past Medical History:  Diagnosis Date   Breast cancer (HCC) 03/30/2023   Colonic adenoma 12/2018   ONE   Diabetes mellitus    Fatty liver    Hypertension    MGUS (monoclonal gammopathy of unknown significance)    Personal history of chemotherapy    Personal history of radiation therapy    Port-A-Cath in place 07/05/2023   Proteinuria    Past Surgical History:  Procedure Laterality Date   ABDOMINAL HYSTERECTOMY     AXILLARY LYMPH NODE DISSECTION Left 06/10/2023   Procedure: COMPLETION LEFT AXILLARY LYMPH NODE DISSECTION;  Surgeon: Curvin Deward MOULD, MD;  Location: [facility];  Service: General;  Laterality: Left;   BREAST BIOPSY  2005   left   BREAST BIOPSY Left 03/30/2023   US  LT BREAST BX W LOC DEV EA ADD LESION IMG BX SPEC US  GUIDE 03/30/2023 [facility]   BREAST BIOPSY Left 03/30/2023   MM LT BREAST BX W LOC DEV 1ST LESION IMAGE BX SPEC STEREO GUIDE 03/30/2023 [facility]   BREAST BIOPSY Left 03/30/2023   US  LT BREAST BX W LOC DEV 1ST LESION IMG BX SPEC US  GUIDE 03/30/2023 [facility]   BREAST BIOPSY Left 05/05/2023   US  LT RADIOACTIVE SEED LOC 05/05/2023 [facility]   CATARACT EXTRACTION W/PHACO Right 05/10/2017   Procedure: CATARACT EXTRACTION PHACO AND  INTRAOCULAR LENS PLACEMENT (IOC);  Surgeon: Roz Anes, MD;  Location: [facility];  Service: Ophthalmology;  Laterality: Right;  CDE: 5.04   CATARACT EXTRACTION W/PHACO Left 05/24/2017   Procedure: CATARACT EXTRACTION PHACO AND INTRAOCULAR LENS PLACEMENT (IOC);  Surgeon: Roz Anes, MD;  Location: [facility];  Service: Ophthalmology;  Laterality: Left;  CDE: 8.40   COLONOSCOPY  07/13/2011   Procedure: COLONOSCOPY;  Surgeon: Margo CHRISTELLA Haddock, MD;  [facility];  Service: Endoscopy;  Laterality: N/A;  9:30 AM   COLONOSCOPY N/A 12/09/2018   diverticulosis, suspect diverticulum in mid sigmoid s/p epi, unable to place clip. 5 mm benign polyp. Colonic lipoma. Normal TI. Many diverticula occluded with stool.    MASTECTOMY Left 2024   NODE DISSECTION Left 05/06/2023   Procedure: TARGETED NODE DISSECTION;  Surgeon: Curvin Deward MOULD, MD;  Location: [facility];  Service: General;  Laterality: Left;   POLYPECTOMY  12/09/2018   Procedure: POLYPECTOMY;  Surgeon: Shaaron Lamar CHRISTELLA, MD;  [facility];  Service: Endoscopy;;  splenic flexure   PORT-A-CATH REMOVAL N/A 02/22/2024   Procedure: REMOVAL PORT-A-CATH;  Surgeon: Curvin Deward MOULD, MD;  Location: [facility];  Service: General;  Laterality: N/A;   PORTACATH PLACEMENT N/A 06/10/2023   Procedure: INSERTION PORT-A-CATH WITH  ULTRASOUND GUIDANCE;  Surgeon: Curvin Deward MOULD, MD;  Location: [facility];  Service: General;  Laterality: N/A;   SIMPLE MASTECTOMY WITH AXILLARY SENTINEL NODE BIOPSY Left 05/06/2023   Procedure: LEFT MASTECTOMY AND SENTINEL NODE BIOPSY;  Surgeon: Curvin Deward MOULD, MD;  Location: [facility] OR;  Service: General;  Laterality: Left;   Patient Active Problem List   Diagnosis Date Noted   Genetic testing 03/16/2024   Cancer of left female breast (HCC) 05/06/2023   Malignant neoplasm of upper-outer quadrant of left breast in female, estrogen receptor positive (HCC) 04/07/2023   Loss of weight 12/09/2021   Loose  stools 10/03/2020   Fatty liver 07/18/2019   GIB (gastrointestinal bleeding) 12/10/2018   Lower GI bleed 12/08/2018   Nontoxic multinodular goiter 05/22/2015   Diabetes mellitus without complication (HCC) 05/22/2015   Essential hypertension, benign 05/22/2015   Palpable thyroid  08/29/2014   MGUS (monoclonal gammopathy of unknown significance) 08/28/2014    PCP: Norleen General, MD  REFERRING PROVIDER: Crawford Morna Pickle, NP   REFERRING DIAG:  [phone] (ICD-10-CM) - Malignant neoplasm of upper-outer quadrant of left breast in female, estrogen receptor positive (HCC) I89.0 (ICD-10-CM) - Lymphedema  THERAPY DIAG:  1. Postmastectomy lymphedema   2. Stiffness of left shoulder, not elsewhere classified   3. Malignant neoplasm of upper-outer quadrant of left breast in female, estrogen receptor positive (HCC)    ONSET DATE: 01/01/24  Rationale for Evaluation and Treatment: Rehabilitation  SUBJECTIVE:                                                                                                                                                                                           SUBJECTIVE STATEMENT:  The tender spot at my breast is gone, I think my bra just rubbed it the other day. I'm doing good and am ready to make to day my last visit.   PERTINENT HISTORY: Left breast cancer, ER positive ILC with positive node on biopsy. 05/06/23: Lt mastectomy 05/06/23 with 3/4 nodes positive. 06/10/23: ALND 0/1 nodes. Completed chemotherapy with Taxotere  and cytoxan , and radiation. Other hx includes: kidney disease, DM, liver disease.   PAIN:  Are you having pain? No  PRECAUTIONS: lymphedema risk Left side   RED FLAGS: None   WEIGHT BEARING RESTRICTIONS: No  FALLS:  Has patient fallen in last 6 months? No  LIVING ENVIRONMENT: Lives with: lives alone her daughter stays with her for a few days Lives in a house Stairs: No Medical equipment: None  OCCUPATION:  Retired  LEISURE: Gardening  HAND DOMINANCE: right   PRIOR LEVEL OF FUNCTION: Independent  PATIENT GOALS: learn how to manage  lymphedema on her own and learn what to do and what not to do   OBJECTIVE: Note: Objective measures were completed at Evaluation unless otherwise noted.  COGNITION: Overall cognitive status: Within functional limits for tasks assessed   PALPATION:   OBSERVATIONS / OTHER ASSESSMENTS:   POSTURE: rounded shoulders    UPPER EXTREMITY AROM/PROM:  A/PROM RIGHT   eval   Shoulder extension 75  Shoulder flexion 150  Shoulder abduction 156  Shoulder internal rotation 36  Shoulder external rotation 86    (Blank rows = not tested)  A/PROM LEFT   eval 03/21/24 04/03/24 05/03/24  Shoulder extension 55     Shoulder flexion 115 119 135 137  Shoulder abduction 112 112 126 131  Shoulder internal rotation 42     Shoulder external rotation 89       (Blank rows = not tested)  CERVICAL AROM: All within normal limits:   LYMPHEDEMA ASSESSMENTS:   LANDMARK RIGHT  eval  At axilla  31.5  15 cm proximal to olecranon process 28.2  10 cm proximal to olecranon process 27.5  Olecranon process 26  15 cm proximal to ulnar styloid process 24.1  10 cm proximal to ulnar styloid process 20  Just proximal to ulnar styloid process 17  Across hand at thumb web space 20  At base of 2nd digit 6.4  (Blank rows = not tested)  LANDMARK LEFT  eval  At axilla  31  15 cm proximal to olecranon process 29  10 cm proximal to olecranon process 29  Olecranon process 26.7  15 cm proximal to ulnar styloid process 24  10 cm proximal to ulnar styloid process 19.4  Just proximal to ulnar styloid process 17.1  Across hand at thumb web space 19.8  At base of 2nd digit 6.5  (Blank rows = not tested)   L-DEX LYMPHEDEMA SCREENING: The patient was assessed using the L-Dex machine today to produce a lymphedema index baseline score. The patient will be reassessed on a regular basis  (typically every 3 months) to obtain new L-Dex scores. If the score is > 6.5 points away from his/her baseline score indicating onset of subclinical lymphedema, it will be recommended to wear a compression garment for 4 weeks, 12 hours per day and then be reassessed. If the score continues to be > 6.5 points from baseline at reassessment, we will initiate lymphedema treatment. Assessing in this manner has a 95% rate of preventing clinically significant lymphedema.  L-DEX LYMPHEDEMA SCREENING Measurement Type: Unilateral L-DEX MEASUREMENT EXTREMITY: Upper Extremity POSITION : Standing DOMINANT SIDE: Right At Risk Side: Right BASELINE SCORE (UNILATERAL): -6.5 L-DEX SCORE (UNILATERAL): -2 VALUE CHANGE (UNILAT): 4.5  Flowsheet Row Outpatient Rehab from 03/09/2024 in [facility]  Lymphedema Life Impact Scale Total Score 14.71 %    TODAY'S TREATMENT: 05/03/24: Therapeutic Exercises Pulleys into flex and abd x 2 mins each with VC's during to remind to relax shoulders during Self Care SOZO re done, pt back WNLs. Discussed current HEP and how she is doing with this at home. Then also reviewed goals and determined pt is ready for D/C per POC.  Manual Therapy MLD to Lt UE: Short neck, superficial and deep abdominals, Rt axillary and pectoral nodes,  anterior inter-axillary anastomosis, Lt inguinal nodes, Lt axillo-inguinal, then focused on Lt upper arm (lateral, medial to lateral, and then lateral again), forearm (both sides), and dorsum hand and fingers then retracing all steps back to anastomosis having pt return demo of each  step for review.  P/ROM during MLD into Lt shoulder flex, scaption and D2 with scapular depression throughout MFR to tightness in axilla where mild cording still palpable but improved STM to Lt pect insertion where pt palpably very tight  05/01/24: Therapeutic Exercises  Roll yellow ball up wall with 1# on Lt wrist into flex x 10 and then Lt abd x  10 Pulleys into flex and abd x 2 mins each with VC's during to remind to relax shoulders during Therapeutic Activities Free Motion Machine for postural strength 7# x 10 reps, then bil UE ext 3# x 10 reps returning therapist demo for each Manual Therapy MLD to Lt UE: Short neck, superficial and deep abdominals, Rt axillary and pectoral nodes,  anterior inter-axillary anastomosis, Lt inguinal nodes, Lt axillo-inguinal, then focused on Lt upper arm (lateral, medial to lateral, and then lateral again), forearm (both sides), and dorsum hand and fingers then retracing all steps back to anastomosis having pt return demo of each step for review.  P/ROM during MLD into Lt shoulder flex, scaption and D2 with scapular depression throughout MFR to tightness in axilla STM to Lt pect insertion where pt palpably very tight  04/26/24: Therapeutic Exercises  Pulleys into flex x 2 mins, and abd x 1 min Roll yellow ball up wall with 1# on Lt wrist into flex x 10 and then Lt abd x 10 Therapeutic Activities Free Motion Machine for postural strength 7# x 10 reps, then bil UE ext 3# x 10 reps returning therapist demo for each Manual Therapy MLD to Lt UE: Short neck, superficial and deep abdominals, Rt axillary and pectoral nodes,  anterior inter-axillary anastomosis, Lt inguinal nodes, Lt axillo-inguinal, then focused on Lt upper arm (lateral, medial to lateral, and then lateral again), forearm (both sides), and dorsum hand and fingers then retracing all steps back to anastomosis.  P/ROM during MLD into Lt shoulder flex, scaption and D2 with scapular depression throughout MFR to tightness in axilla STM to Lt pect insertion where pt palpably very tight                                                                                                                                             PATIENT EDUCATION:  Education details: Supine scapular series with yellow theraband  Person educated: Patient Education  method: Explanation, Demonstration, Verbal cues, and Handouts Education comprehension: verbalized understanding and needs further education, tactile and VC's during  HOME EXERCISE PROGRAM: Post op exercises Wear compression sleeve 12 hours/day during waking hours Supine scapular series with yellow theraband  ASSESSMENT:  CLINICAL IMPRESSION: Pt has done excellent with this episode of care. She has met all goals and is ready for D/C at this time. She knows that we would be happy to resume care if ever needed with a new referral for physical therapy. Also discussed possibility of her talking to  her PCP about seeing an orthopedist in future if needed if she wants more answers for her shoulder limitations. Pt verbalized understanding.   OBJECTIVE IMPAIRMENTS: decreased activity tolerance, decreased knowledge of condition, decreased knowledge of use of DME, decreased ROM, increased edema, increased fascial restrictions, impaired UE functional use, and postural dysfunction.   ACTIVITY LIMITATIONS: carrying, lifting, and reach over head  PARTICIPATION LIMITATIONS: none  PERSONAL FACTORS: Age and 1 comorbidity: diabetes, radiation to axilla are also affecting patient's functional outcome.   REHAB POTENTIAL: Excellent  CLINICAL DECISION MAKING: Stable/uncomplicated  EVALUATION COMPLEXITY: Low  GOALS: Goals reviewed with patient? Yes  SHORT TERM GOALS=LTGs: Target date: 04/06/24  Pt will improve Lt shoulder flexion and abduction to equal to the opposite side to demonstrate improved reach  Baseline: Flex 119 and Abd 112; Flex 135 and Abd 131  Goal status: NOT MET  2.  Pt will be ind with a home exercise program for continued stretching and strengthening. Baseline:  04/05/24 - independent with initial HEP, will benefit from review of self MLD Goal status:  MET  3.  Pt will be independent in self MLD for long term management of lymphedema.  Baseline:  Goal status: MET  4. Pt will be  educated on lymphedema surveillance, presentations and risk reduction  Goal Status: MET  5. Pt will return to the green on ldex screening to demonstrate reversal of subclinical lymphedema.  Goal status: MET  PLAN:  PT FREQUENCY: 2x/week  PT DURATION: 4 weeks  PLANNED INTERVENTIONS: [id number]- PT Re-evaluation, 97110-Therapeutic exercises, 97530- Therapeutic activity, [id number]- Neuromuscular re-education, 97535- Self Care, [id number]- Manual therapy, 97760- Orthotic Initial, [phone]- Orthotic/Prosthetic subsequent, Patient/Family education, Balance training, Therapeutic exercises, and Self Care  PLAN FOR NEXT SESSION: D/C this visit.  Cont every 3 month L-Dex screens.  PHYSICAL THERAPY DISCHARGE SUMMARY  Visits from Start of Care: 15  Current functional level related to goals / functional outcomes: Achieved most goals   Remaining deficits: Left shoulder ROM still slightly limited compared to right with abduction   Education / Equipment: HEP   Patient agrees to discharge. Patient goals were nearly MET. Patient is being discharged due to being pleased with the current functional level.    Aden Berwyn Caldron, PTA Grayce Sheldon, PT 05/03/24 1:40 PM  05/03/2024, 12:32 PM

## 2024-05-17 ENCOUNTER — Telehealth: Payer: Self-pay | Admitting: Gastroenterology

## 2024-05-17 NOTE — Telephone Encounter (Signed)
 Good morning Dr. Leigh,   DOD for today 10/16 at AM   Patient called stating that she had a referral sent over to us  to have a colonoscopy. Patient states that her last procedure was in May of 2020 at [facility] with Dr. Shaaron. Patient is requesting transfer due to her Oncologist referring her to us . Patients records are in epic, will you please review and advise on scheduling patient?   Thank you.

## 2024-05-17 NOTE — Telephone Encounter (Signed)
 I can see her for a new patient visit in the office.

## 2024-05-24 NOTE — Telephone Encounter (Signed)
 Spoke with patient had advised her of recommendations. Advised her that Dr. Leigh did not have any appointments at this time and I added her to my list to call once his January schedule is released.

## 2024-06-08 ENCOUNTER — Encounter: Payer: Self-pay | Admitting: Gastroenterology

## 2024-08-08 ENCOUNTER — Other Ambulatory Visit: Payer: Self-pay

## 2024-08-08 DIAGNOSIS — Z1379 Encounter for other screening for genetic and chromosomal anomalies: Secondary | ICD-10-CM

## 2024-08-12 NOTE — Progress Notes (Unsigned)
 "  HPI :  Dr. Cindie 07/2022:  79 y.o. female who presents to clinic today for yearly follow-up visit.  She has history of fatty liver and acute blood loss anemia secondary to diverticular bleed in May 2020 s/p colonoscopy during hospitalization revealing pancolonic diverticula, epinephrine  injected into a large diverticula in the mid sigmoid colon.  There was also a 5 mm polyp in the splenic flexure.  Pathology with tubular adenoma.   In regards to her fatty liver, last blood work showed normal LFTs in February 2023..  Weight stable.  Diabetes under good control.  Watching her diet. Limiting fried/fatty foods. Occasional soda. No regular sweets. Drinks black coffee, about 1 cup a day.  Denies swelling in the abdomen or lower extremities, confusion or changes in mental status, yellowing of the eyes, or easy bruising.   Intermittent loose stools when eating sugary items or lettuce otherwise bowel movements are normal.  Does note increased GI symptoms when eating red meat.  States she had a tick bite in the summer.  Notes epigastric pain related to me as well as diarrhea and abdominal bloating.  Denies any upper GI symptoms including heartburn, reflux, epigastric or chest pain, dysphagia/odynophagia   Assessment: *Diarrhea-intermittent *Nonalcoholic fatty liver disease *Epigastric pain *Abdominal bloating   Plan: Diarrhea mild, intermittent, related to sugary foods as well as lettuce.  Recommended food diary and avoid triggers.   Does note new onset of GI symptoms related to red meat.  Reports tick bite this past summer.  Will check alpha gal today.  Screen for celiac disease as well given worsening abdominal pain and bloating.   Weight stable.  Continue to monitor.   Fatty liver disease stable.  Check CBC, CMP today.  Continue to monitor.  Keep diabetes under good control.     Past Medical History:  Diagnosis Date   Breast cancer (HCC) 03/30/2023   Colonic adenoma 12/2018   ONE    Diabetes mellitus    Fatty liver    Hypertension    MGUS (monoclonal gammopathy of unknown significance)    Personal history of chemotherapy    Personal history of radiation therapy    Port-A-Cath in place 07/05/2023   Proteinuria      Past Surgical History:  Procedure Laterality Date   ABDOMINAL HYSTERECTOMY     AXILLARY LYMPH NODE DISSECTION Left 06/10/2023   Procedure: COMPLETION LEFT AXILLARY LYMPH NODE DISSECTION;  Surgeon: Curvin Deward MOULD, MD;  Location: [facility];  Service: General;  Laterality: Left;   BREAST BIOPSY  2005   left   BREAST BIOPSY Left 03/30/2023   US  LT BREAST BX W LOC DEV EA ADD LESION IMG BX SPEC US  GUIDE 03/30/2023 [facility]   BREAST BIOPSY Left 03/30/2023   MM LT BREAST BX W LOC DEV 1ST LESION IMAGE BX SPEC STEREO GUIDE 03/30/2023 [facility]   BREAST BIOPSY Left 03/30/2023   US  LT BREAST BX W LOC DEV 1ST LESION IMG BX SPEC US  GUIDE 03/30/2023 [facility]   BREAST BIOPSY Left 05/05/2023   US  LT RADIOACTIVE SEED LOC 05/05/2023 [facility]   CATARACT EXTRACTION W/PHACO Right 05/10/2017   Procedure: CATARACT EXTRACTION PHACO AND INTRAOCULAR LENS PLACEMENT (IOC);  Surgeon: Roz Anes, MD;  Location: [facility];  Service: Ophthalmology;  Laterality: Right;  CDE: 5.04   CATARACT EXTRACTION W/PHACO Left 05/24/2017   Procedure: CATARACT EXTRACTION PHACO AND INTRAOCULAR LENS PLACEMENT (IOC);  Surgeon: Roz Anes, MD;  Location: [facility];  Service: Ophthalmology;  Laterality: Left;  CDE: 8.40   COLONOSCOPY  07/13/2011   Procedure: COLONOSCOPY;  Surgeon: Margo CHRISTELLA Haddock, MD;  [facility];  Service: Endoscopy;  Laterality: N/A;  9:30 AM   COLONOSCOPY N/A 12/09/2018   diverticulosis, suspect diverticulum in mid sigmoid s/p epi, unable to place clip. 5 mm benign polyp. Colonic lipoma. Normal TI. Many diverticula occluded with stool.    MASTECTOMY Left 2024   NODE DISSECTION Left 05/06/2023   Procedure: TARGETED NODE  DISSECTION;  Surgeon: Curvin Deward MOULD, MD;  [facility];  Service: General;  Laterality: Left;   POLYPECTOMY  12/09/2018   Procedure: POLYPECTOMY;  Surgeon: Shaaron Lamar CHRISTELLA, MD;  [facility];  Service: Endoscopy;;  splenic flexure   PORT-A-CATH REMOVAL N/A 02/22/2024   Procedure: REMOVAL PORT-A-CATH;  Surgeon: Curvin Deward MOULD, MD;  Location: [facility];  Service: General;  Laterality: N/A;   PORTACATH PLACEMENT N/A 06/10/2023   Procedure: INSERTION PORT-A-CATH WITH ULTRASOUND GUIDANCE;  Surgeon: Curvin Deward MOULD, MD;  Location: [facility];  Service: General;  Laterality: N/A;   SIMPLE MASTECTOMY WITH AXILLARY SENTINEL NODE BIOPSY Left 05/06/2023   Procedure: LEFT MASTECTOMY AND SENTINEL NODE BIOPSY;  Surgeon: Curvin Deward MOULD, MD;  Location: [facility];  Service: General;  Laterality: Left;   Family History  Problem Relation Age of Onset   Ovarian cancer Mother 39 - 4   Multiple myeloma Sister 64 - 69   Cancer Maternal Uncle        unknown type   Cancer Maternal Uncle        unknown type   Cancer Paternal Aunt        unknown type   Cancer Half-Sister        unknown type dx. <50, paternal   Breast cancer Paternal Cousin        first cousin   Colon cancer Neg Hx    Social History[1] Current Outpatient Medications  Medication Sig Dispense Refill   amLODipine  (NORVASC ) 10 MG tablet Take 10 mg by mouth at bedtime.      anastrozole  (ARIMIDEX ) 1 MG tablet Take 1 tablet (1 mg total) by mouth daily. 90 tablet 3   losartan  (COZAAR ) 100 MG tablet Take 100 mg by mouth daily.     metFORMIN  (GLUCOPHAGE ) 1000 MG tablet Take 1,000 mg by mouth 2 (two) times daily with a meal.  0   oxyCODONE  (ROXICODONE ) 5 MG immediate release tablet Take 1 tablet (5 mg total) by mouth every 6 (six) hours as needed. (Patient not taking: Reported on 03/09/2024) 5 tablet 0   No current facility-administered medications for this visit.   Allergies[2]   Review of Systems: All  systems reviewed and negative except where noted in HPI.    No results found.  Physical Exam: There were no vitals taken for this visit. Constitutional: Pleasant,well-developed, ***female in no acute distress. HEENT: Normocephalic and atraumatic. Conjunctivae are normal. No scleral icterus. Neck supple.  Cardiovascular: Normal rate, regular rhythm.  Pulmonary/chest: Effort normal and breath sounds normal. No wheezing, rales or rhonchi. Abdominal: Soft, nondistended, nontender. Bowel sounds active throughout. There are no masses palpable. No hepatomegaly. Extremities: no edema Lymphadenopathy: No cervical adenopathy noted. Neurological: Alert and oriented to person place and time. Skin: Skin is warm and dry. No rashes noted. Psychiatric: Normal mood and affect. Behavior is normal.   ASSESSMENT: 79 y.o. female here for assessment of the following  No diagnosis found.  PLAN:   Marvine Rush,  MD    [1]  Social History Tobacco Use   Smoking status: Former   Smokeless tobacco: Never  Vaping Use   Vaping status: Never Used  Substance Use Topics   Alcohol use: Yes    Comment: rare wine   Drug use: No  [2]  Allergies Allergen Reactions   Lipitor [Atorvastatin] Nausea And Vomiting   "

## 2024-08-13 ENCOUNTER — Other Ambulatory Visit (INDEPENDENT_AMBULATORY_CARE_PROVIDER_SITE_OTHER)

## 2024-08-13 ENCOUNTER — Ambulatory Visit: Admitting: Gastroenterology

## 2024-08-13 ENCOUNTER — Encounter: Payer: Self-pay | Admitting: Gastroenterology

## 2024-08-13 VITALS — BP 122/76 | HR 87 | Ht 68.0 in | Wt 164.0 lb

## 2024-08-13 DIAGNOSIS — K5731 Diverticulosis of large intestine without perforation or abscess with bleeding: Secondary | ICD-10-CM | POA: Diagnosis not present

## 2024-08-13 DIAGNOSIS — Z860100 Personal history of colon polyps, unspecified: Secondary | ICD-10-CM | POA: Diagnosis not present

## 2024-08-13 DIAGNOSIS — K760 Fatty (change of) liver, not elsewhere classified: Secondary | ICD-10-CM

## 2024-08-13 DIAGNOSIS — Z758 Other problems related to medical facilities and other health care: Secondary | ICD-10-CM

## 2024-08-13 LAB — CBC WITH DIFFERENTIAL/PLATELET
Basophils Absolute: 0 K/uL (ref 0.0–0.1)
Basophils Relative: 1 % (ref 0.0–3.0)
Eosinophils Absolute: 0.1 K/uL (ref 0.0–0.7)
Eosinophils Relative: 3 % (ref 0.0–5.0)
HCT: 35.6 % — ABNORMAL LOW (ref 36.0–46.0)
Hemoglobin: 11.6 g/dL — ABNORMAL LOW (ref 12.0–15.0)
Lymphocytes Relative: 27.1 % (ref 12.0–46.0)
Lymphs Abs: 0.9 K/uL (ref 0.7–4.0)
MCHC: 32.5 g/dL (ref 30.0–36.0)
MCV: 84 fl (ref 78.0–100.0)
Monocytes Absolute: 0.6 K/uL (ref 0.1–1.0)
Monocytes Relative: 17.2 % — ABNORMAL HIGH (ref 3.0–12.0)
Neutro Abs: 1.7 K/uL (ref 1.4–7.7)
Neutrophils Relative %: 51.7 % (ref 43.0–77.0)
Platelets: 253 K/uL (ref 150.0–400.0)
RBC: 4.24 Mil/uL (ref 3.87–5.11)
RDW: 15.3 % (ref 11.5–15.5)
WBC: 3.3 K/uL — ABNORMAL LOW (ref 4.0–10.5)

## 2024-08-13 LAB — HEPATIC FUNCTION PANEL
ALT: 20 U/L (ref 3–35)
AST: 16 U/L (ref 5–37)
Albumin: 4 g/dL (ref 3.5–5.2)
Alkaline Phosphatase: 33 U/L — ABNORMAL LOW (ref 39–117)
Bilirubin, Direct: 0.1 mg/dL (ref 0.1–0.3)
Total Bilirubin: 0.4 mg/dL (ref 0.2–1.2)
Total Protein: 8 g/dL (ref 6.0–8.3)

## 2024-08-13 MED ORDER — NA SULFATE-K SULFATE-MG SULF 17.5-3.13-1.6 GM/177ML PO SOLN: 1.0000 | Freq: Once | ORAL | 0 refills | Status: AC

## 2024-08-13 NOTE — Patient Instructions (Addendum)
 You have been scheduled for a colonoscopy. Please follow written instructions given to you at your visit today.   If you use inhalers (even only as needed), please bring them with you on the day of your procedure.  DO NOT TAKE 7 DAYS PRIOR TO TEST- Trulicity (dulaglutide) Ozempic, Wegovy (semaglutide) Mounjaro, Zepbound (tirzepatide) Bydureon Bcise (exanatide extended release)  DO NOT TAKE 1 DAY PRIOR TO YOUR TEST Rybelsus (semaglutide) Adlyxin (lixisenatide) Victoza (liraglutide) Byetta (exanatide) ___________________________________________________________________________   Please go to the lab in the basement of our building to have lab work done as you leave today. Hit B for basement when you get on the elevator.  When the doors open the lab is on your left.  We will call you with the results. Thank you.  We are referring you to [facility]. They will contact you directly to schedule an appointment.  It may take a week or more before you hear from them.  Please feel free to contact us  if you have not heard from them within 2 weeks and we will follow up on the referral.   Thank you for entrusting me with your care and for choosing [facility], Dr. Elspeth Naval    _______________________________________________________  If your blood pressure at your visit was 140/90 or greater, please contact your primary care physician to follow up on this.  _______________________________________________________  If you are age 77 or older, your body mass index should be between 23-30. Your Body mass index is 24.94 kg/m. If this is out of the aforementioned range listed, please consider follow up with your Primary Care Provider.  If you are age 24 or younger, your body mass index should be between 19-25. Your Body mass index is 24.94 kg/m. If this is out of the aformentioned range listed, please consider follow up with your Primary Care Provider.    ________________________________________________________  The [facility] providers would like to encourage you to use MYCHART to communicate with providers for non-urgent requests or questions.  Due to long hold times on the telephone, sending your provider a message by [facility] may be a faster and more efficient way to get a response.  Please allow 48 business hours for a response.  Please remember that this is for non-urgent requests.  _______________________________________________________  [facility] is using a team-based approach to care.  Your team is made up of your doctor and two to three APPS. Our APPS (Nurse Practitioners and Physician Assistants) work with your physician to ensure care continuity for you. They are fully qualified to address your health concerns and develop a treatment plan. They communicate directly with your gastroenterologist to care for you. Seeing the Advanced Practice Practitioners on your physician's team can help you by facilitating care more promptly, often allowing for earlier appointments, access to diagnostic testing, procedures, and other specialty referrals.

## 2024-08-14 ENCOUNTER — Ambulatory Visit: Payer: Self-pay | Admitting: Gastroenterology

## 2024-08-27 ENCOUNTER — Ambulatory Visit

## 2024-09-03 ENCOUNTER — Ambulatory Visit

## 2024-09-05 ENCOUNTER — Inpatient Hospital Stay: Admitting: Hematology and Oncology

## 2024-09-05 ENCOUNTER — Inpatient Hospital Stay

## 2024-09-05 VITALS — BP 138/77 | HR 90 | Temp 98.0°F | Resp 20 | Ht 68.0 in | Wt 165.9 lb

## 2024-09-05 DIAGNOSIS — C50412 Malignant neoplasm of upper-outer quadrant of left female breast: Secondary | ICD-10-CM | POA: Diagnosis not present

## 2024-09-05 DIAGNOSIS — Z170 Estrogen receptor positive status [ER+]: Secondary | ICD-10-CM | POA: Diagnosis not present

## 2024-09-05 DIAGNOSIS — Z1379 Encounter for other screening for genetic and chromosomal anomalies: Secondary | ICD-10-CM

## 2024-09-05 NOTE — Assessment & Plan Note (Signed)
 Palpable concern left axilla, mammogram showed 0.5 cm mass left breast 1:30 position possibly an intramammary lymph node, at the site of palpable concern there was an abnormal lymph node.  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, left axillary lymph node biopsy positive, no evidence of extranodal extension, ER 100%, PR 99%, Ki-67 50%, HER2 1+ negative, stage IIa    05/06/2023: Left mastectomy: Grade 3 ILC with LCIS, 5 cm, margins negative, lymphovascular base involvement present, 3/4 lymph nodes positive, ER 100%, PR 99%, HER2 negative 1+, Ki-67 50% 06/10/2023: ALND: 0/1 lymph node   Treatment plan: Based upon tumor size greater than 5 cm and presence of 3 positive lymph nodes being grade 3 and Ki-67 of 50% I recommended systemic chemotherapy with Taxotere  and Cytoxan  every 3 weeks x 4 completed 08/22/2023 Adjuvant radiation therapy completed 11/10/2023 Adjuvant antiestrogen therapy started 12/15/2023 --------------------------------------------------------------------------------------------------------------------------- Current treatment: Antiestrogen therapy with anastrozole  1 mg daily started 12/15/2023   Anastrozole  Toxicities:    Breast Cancer Surveillance: Breast Exam: 09/05/24: Benign Mammogram: 03/22/24: benign Density B  Diabetes type 2: A1c was 6.4 on 05/04/2023.  Currently on metformin .    Return to clinic in 1 year

## 2024-09-05 NOTE — Progress Notes (Signed)
 "  Patient Care Team: Marvine Rush, MD as PCP - General (Family Medicine) Cindie Carlin POUR, DO as Consulting Physician (Internal Medicine) Curvin Deward MOULD, MD as Consulting Physician (General Surgery) Odean Potts, MD as Consulting Physician (Hematology and Oncology) Dewey Rush, MD as Consulting Physician (Radiation Oncology)  DIAGNOSIS:  Encounter Diagnosis  Name Primary?   Malignant neoplasm of upper-outer quadrant of left breast in female, estrogen receptor positive (HCC) Yes    SUMMARY OF ONCOLOGIC HISTORY: Oncology History  Malignant neoplasm of upper-outer quadrant of left breast in female, estrogen receptor positive (HCC)  03/30/2023 Initial Diagnosis   Palpable concern left axilla, mammogram showed 0.5 cm mass left breast 1:30 position possibly an intramammary lymph node, at the site of palpable concern there was an abnormal lymph node.  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 The recommendation was for a discussion about ALND.  Dr. Odean plans to discuss adj chemo with her also. He is seeing her 11/4.  Looks like she is scheduled for post op appt with Dr. Curvin 10/24.    06/06/2023 Cancer Staging   Staging form: Breast, AJCC 8th Edition - Pathologic: Stage IIA (pT2, pN1(sn), cM0, G3, ER+, PR+, HER2-) - Signed by Odean Potts, MD on 06/06/2023 Stage prefix: Initial diagnosis Method of lymph node assessment: Sentinel lymph node biopsy Histologic grading system: 3 grade system   06/20/2023 - 08/24/2023 Chemotherapy   Patient is on  Treatment Plan : BREAST TC q21d     09/27/2023 - 11/10/2023 Radiation Therapy   Plan Name: CW_L_BH_BO Site: Chest Wall, Left Technique: 3D Mode: Photon Dose Per Fraction: 1.8 Gy Prescribed Dose (Delivered / Prescribed): 25.2 Gy / 25.2 Gy Prescribed Fxs (Delivered / Prescribed): 14 / 14   Plan Name: CW_L_SCV_BH Site: Chest Wall, Left Technique: 3D Mode: Photon Dose Per Fraction: 1.8 Gy Prescribed Dose (Delivered / Prescribed): 50.4 Gy / 50.4 Gy Prescribed Fxs (Delivered / Prescribed): 28 / 28   Plan Name: CW_L_Bst_BO Site: Chest Wall, Left Technique: Electron Mode: Electron Dose Per Fraction: 2 Gy Prescribed Dose (Delivered / Prescribed): 10 Gy / 10 Gy Prescribed Fxs (Delivered / Prescribed): 5 / 5   Plan Name: CW_L_BH Site: Chest Wall, Left Technique: 3D Mode: Photon Dose Per Fraction: 1.8 Gy Prescribed Dose (Delivered / Prescribed): 25.2 Gy / 25.2 Gy Prescribed Fxs (Delivered / Prescribed): 14 / 14   12/2023 -  Anti-estrogen oral therapy   Anastrozole  x 5-7 years     CHIEF COMPLIANT: Surveillance of breast cancer on anastrozole  therapy  HISTORY OF PRESENT ILLNESS: History of Present Illness Melanie Welch is a 79 year old female with estrogen receptor positive, 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  She remains on adjuvant anastrozole  with ongoing surveillance for treatment effects and bone health. Surveillance mammogram in August of last year showed low breast density.  She notes increased body warmth without true hot flashes. She has morning arthralgia and intermittent muscle cramps with episodes of trigger  finger, worse with fatigue or prolonged gripping. Stretching and warm water  soaks help her joint symptoms, and she prefers to continue anastrozole  without changes. She has persistent fatigue, which she attributes  partly to anastrozole  but recalls similar fatigue before starting therapy.      ALLERGIES:  is allergic to lipitor [atorvastatin].  MEDICATIONS:  Current Outpatient Medications  Medication Sig Dispense Refill   amLODipine  (NORVASC ) 10 MG tablet Take 10 mg by mouth at bedtime.      anastrozole  (ARIMIDEX ) 1 MG tablet Take 1 tablet (1 mg total) by mouth daily. 90 tablet 3   losartan  (COZAAR ) 100 MG tablet Take 100 mg by mouth daily.     metFORMIN  (GLUCOPHAGE ) 1000 MG tablet Take 1,000 mg by mouth 2 (two) times daily with a meal.  0   No current facility-administered medications for this visit.    PHYSICAL EXAMINATION: ECOG PERFORMANCE STATUS: 1 - Symptomatic but completely ambulatory  Vitals:   09/05/24 1018  BP: 138/77  Pulse: 90  Resp: 20  Temp: 98 F (36.7 C)  SpO2: 100%   Filed Weights   09/05/24 1018  Weight: 165 lb 14.4 oz (75.3 kg)    Physical Exam   (exam performed in the presence of a chaperone)  LABORATORY DATA:  I have reviewed the data as listed    Latest Ref Rng & Units 08/13/2024    2:18 PM 02/16/2024    3:00 PM 08/22/2023    8:42 AM  CMP  Glucose 70 - 99 mg/dL  839  804   BUN 8 - 23 mg/dL  13  19   Creatinine 9.55 - 1.00 mg/dL  8.96  9.03   Sodium 864 - 145 mmol/L  138  140   Potassium 3.5 - 5.1 mmol/L  4.0  3.8   Chloride 98 - 111 mmol/L  104  108   CO2 22 - 32 mmol/L  26  22   Calcium 8.9 - 10.3 mg/dL  9.5  9.7   Total Protein 6.0 - 8.3 g/dL 8.0   7.6   Total Bilirubin 0.2 - 1.2 mg/dL 0.4   0.3   Alkaline Phos 39 - 117 U/L 33   48   AST 5 - 37 U/L 16   14   ALT 3 - 35 U/L 20   22     Lab Results  Component Value Date   WBC 3.3 (L) 08/13/2024   HGB 11.6 (L) 08/13/2024   HCT 35.6 (L) 08/13/2024   MCV 84.0 08/13/2024   PLT 253.0 08/13/2024   NEUTROABS 1.7 08/13/2024    ASSESSMENT & PLAN:  Malignant neoplasm of upper-outer quadrant of left breast in female, estrogen receptor positive (HCC) Palpable concern left axilla, mammogram showed  0.5 cm mass left breast 1:30 position possibly an intramammary lymph node, at the site of palpable concern there was an abnormal lymph node.  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, left axillary lymph node biopsy positive, no evidence of extranodal extension, ER 100%, PR 99%, Ki-67 50%, HER2 1+ negative, stage IIa    05/06/2023: Left mastectomy: Grade 3 ILC with LCIS, 5 cm, margins negative, lymphovascular base involvement present, 3/4 lymph nodes positive, ER 100%, PR 99%, HER2 negative 1+, Ki-67 50% 06/10/2023: ALND: 0/1 lymph node   Treatment plan: Based upon tumor size greater than 5 cm and presence of 3 positive lymph nodes being grade 3 and Ki-67 of 50% I recommended systemic chemotherapy with Taxotere  and Cytoxan  every 3  weeks x 4 completed 08/22/2023 Adjuvant radiation therapy completed 11/10/2023 Adjuvant antiestrogen therapy started 12/15/2023 --------------------------------------------------------------------------------------------------------------------------- Current treatment: Antiestrogen therapy with anastrozole  1 mg daily started 12/15/2023   Anastrozole  Toxicities:   Feeling of warmth Joint stiffness  Breast Cancer Surveillance: Breast Exam: 09/05/24: Benign Mammogram: 03/22/24: benign Density B  Diabetes type 2: A1c was 6.4 on 05/04/2023.  Currently on metformin .    Return to clinic in 1 year   No orders of the defined types were placed in this encounter.  The patient has a good understanding of the overall plan. she agrees with it. she will call with any problems that may develop before the next visit here.  I personally spent a total of 30 minutes in the care of the patient today including preparing to see the patient, getting/reviewing separately obtained history, performing a medically appropriate exam/evaluation, counseling and educating, placing orders, referring and communicating with other health care professionals, documenting clinical  information in the EHR, independently interpreting results, communicating results, and coordinating care.   Dr.Aniruddh Ciavarella 09/05/24    "

## 2024-09-13 ENCOUNTER — Encounter: Admitting: Gastroenterology

## 2024-09-17 ENCOUNTER — Ambulatory Visit

## 2025-09-05 ENCOUNTER — Inpatient Hospital Stay: Admitting: Hematology and Oncology
